# Patient Record
Sex: Female | Born: 1960
Health system: Southern US, Community
[De-identification: ages and names within clinical notes are randomized; demographics above are authoritative.]

## PROBLEM LIST (undated history)

## (undated) DIAGNOSIS — I509 Heart failure, unspecified: Secondary | ICD-10-CM

## (undated) DIAGNOSIS — M1990 Unspecified osteoarthritis, unspecified site: Secondary | ICD-10-CM

## (undated) DIAGNOSIS — F319 Bipolar disorder, unspecified: Secondary | ICD-10-CM

## (undated) DIAGNOSIS — E119 Type 2 diabetes mellitus without complications: Secondary | ICD-10-CM

## (undated) DIAGNOSIS — E7800 Pure hypercholesterolemia, unspecified: Secondary | ICD-10-CM

## (undated) DIAGNOSIS — K589 Irritable bowel syndrome without diarrhea: Secondary | ICD-10-CM

## (undated) DIAGNOSIS — H353190 Nonexudative age-related macular degeneration, unspecified eye, stage unspecified: Secondary | ICD-10-CM

## (undated) DIAGNOSIS — F329 Major depressive disorder, single episode, unspecified: Secondary | ICD-10-CM

## (undated) DIAGNOSIS — I10 Essential (primary) hypertension: Secondary | ICD-10-CM

## (undated) DIAGNOSIS — F32A Depression, unspecified: Secondary | ICD-10-CM

## (undated) HISTORY — DX: Irritable bowel syndrome, unspecified: K58.9

## (undated) HISTORY — PX: OTHER SURGICAL HISTORY: SHX169

## (undated) HISTORY — PX: CARPAL TUNNEL RELEASE: SHX101

## (undated) HISTORY — DX: Nonexudative age-related macular degeneration, unspecified eye, stage unspecified: H35.3190

## (undated) HISTORY — DX: Depression, unspecified: F32.A

## (undated) HISTORY — DX: Essential (primary) hypertension: I10

## (undated) HISTORY — DX: Major depressive disorder, single episode, unspecified: F32.9

## (undated) HISTORY — DX: Bipolar disorder, unspecified: F31.9

---
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
## 1994-07-27 HISTORY — PX: TUBAL LIGATION: SHX77

## 2000-08-19 ENCOUNTER — Inpatient Hospital Stay (HOSPITAL_COMMUNITY): Admission: EM | Admit: 2000-08-19 | Discharge: 2000-08-23 | Payer: Self-pay | Admitting: Psychiatry

## 2000-09-08 ENCOUNTER — Other Ambulatory Visit: Admission: RE | Admit: 2000-09-08 | Discharge: 2000-09-08 | Payer: Self-pay | Admitting: Obstetrics and Gynecology

## 2002-12-12 ENCOUNTER — Inpatient Hospital Stay (HOSPITAL_COMMUNITY): Admission: EM | Admit: 2002-12-12 | Discharge: 2002-12-19 | Payer: Self-pay | Admitting: Psychiatry

## 2002-12-27 ENCOUNTER — Inpatient Hospital Stay (HOSPITAL_COMMUNITY): Admission: EM | Admit: 2002-12-27 | Discharge: 2002-12-31 | Payer: Self-pay | Admitting: Psychiatry

## 2003-01-16 ENCOUNTER — Inpatient Hospital Stay (HOSPITAL_COMMUNITY): Admission: EM | Admit: 2003-01-16 | Discharge: 2003-01-18 | Payer: Self-pay | Admitting: Emergency Medicine

## 2003-03-12 ENCOUNTER — Emergency Department (HOSPITAL_COMMUNITY): Admission: EM | Admit: 2003-03-12 | Discharge: 2003-03-12 | Payer: Self-pay

## 2003-12-23 ENCOUNTER — Inpatient Hospital Stay (HOSPITAL_COMMUNITY): Admission: EM | Admit: 2003-12-23 | Discharge: 2003-12-25 | Payer: Self-pay | Admitting: Emergency Medicine

## 2003-12-25 ENCOUNTER — Inpatient Hospital Stay (HOSPITAL_COMMUNITY): Admission: RE | Admit: 2003-12-25 | Discharge: 2003-12-28 | Payer: Self-pay | Admitting: Psychiatry

## 2005-07-27 DIAGNOSIS — E119 Type 2 diabetes mellitus without complications: Secondary | ICD-10-CM

## 2005-07-27 HISTORY — DX: Type 2 diabetes mellitus without complications: E11.9

## 2006-01-26 ENCOUNTER — Encounter: Admission: RE | Admit: 2006-01-26 | Discharge: 2006-01-26 | Payer: Self-pay | Admitting: Nephrology

## 2006-03-02 ENCOUNTER — Encounter: Payer: Self-pay | Admitting: Internal Medicine

## 2006-03-04 ENCOUNTER — Encounter: Payer: Self-pay | Admitting: Internal Medicine

## 2006-03-04 LAB — CONVERTED CEMR LAB
ALT: 28 units/L
AST: 17 units/L
Albumin: 3.7 g/dL
Calcium: 9.1 mg/dL
Globulin: 3.3 g/dL
HCT: 37.9 %
Hemoglobin: 13.1 g/dL
Platelets: 234 10*3/uL
Potassium, serum: 4.1 mmol/L
Sodium, serum: 135 mmol/L

## 2006-04-30 ENCOUNTER — Encounter: Admission: RE | Admit: 2006-04-30 | Discharge: 2006-04-30 | Payer: Self-pay | Admitting: Nephrology

## 2006-07-29 ENCOUNTER — Encounter: Admission: RE | Admit: 2006-07-29 | Discharge: 2006-10-27 | Payer: Self-pay | Admitting: Nephrology

## 2006-09-01 ENCOUNTER — Encounter: Payer: Self-pay | Admitting: Internal Medicine

## 2006-09-01 LAB — CONVERTED CEMR LAB
ALT: 31 units/L
Alkaline Phosphatase: 63 units/L
BUN: 13 mg/dL
CO2, serum: 32 mmol/L
Calcium: 9.7 mg/dL
Chloride, Serum: 101 mmol/L
Cholesterol: 161 mg/dL
HDL: 41 mg/dL
Sodium, serum: 139 mmol/L
Total Bilirubin: 0.6 mg/dL

## 2006-11-04 ENCOUNTER — Encounter: Admission: RE | Admit: 2006-11-04 | Discharge: 2007-02-02 | Payer: Self-pay | Admitting: Nephrology

## 2007-01-25 ENCOUNTER — Encounter: Admission: RE | Admit: 2007-01-25 | Discharge: 2007-04-25 | Payer: Self-pay | Admitting: Nephrology

## 2007-04-05 ENCOUNTER — Encounter (INDEPENDENT_AMBULATORY_CARE_PROVIDER_SITE_OTHER): Payer: Self-pay | Admitting: Plastic Surgery

## 2007-04-05 ENCOUNTER — Ambulatory Visit (HOSPITAL_BASED_OUTPATIENT_CLINIC_OR_DEPARTMENT_OTHER): Admission: RE | Admit: 2007-04-05 | Discharge: 2007-04-05 | Payer: Self-pay | Admitting: Plastic Surgery

## 2008-04-24 ENCOUNTER — Encounter: Payer: Self-pay | Admitting: Internal Medicine

## 2008-04-24 LAB — CONVERTED CEMR LAB
AST: 12 units/L
Chloride, Serum: 102 mmol/L
MCH: 31 pg
Potassium, serum: 4.9 mmol/L
RBC count: 4.36 10*6/uL
WBC, blood: 5.6 10*3/uL

## 2008-05-17 ENCOUNTER — Encounter: Payer: Self-pay | Admitting: Internal Medicine

## 2008-05-18 ENCOUNTER — Encounter: Payer: Self-pay | Admitting: Internal Medicine

## 2008-05-18 LAB — CONVERTED CEMR LAB: TSH: 0.673 microintl units/mL

## 2008-06-27 ENCOUNTER — Encounter (INDEPENDENT_AMBULATORY_CARE_PROVIDER_SITE_OTHER): Payer: Self-pay | Admitting: *Deleted

## 2008-07-16 ENCOUNTER — Ambulatory Visit: Payer: Self-pay | Admitting: Internal Medicine

## 2008-07-16 DIAGNOSIS — K589 Irritable bowel syndrome without diarrhea: Secondary | ICD-10-CM | POA: Insufficient documentation

## 2008-07-16 DIAGNOSIS — E1365 Other specified diabetes mellitus with hyperglycemia: Secondary | ICD-10-CM

## 2008-07-16 DIAGNOSIS — L930 Discoid lupus erythematosus: Secondary | ICD-10-CM | POA: Insufficient documentation

## 2008-07-16 DIAGNOSIS — IMO0002 Reserved for concepts with insufficient information to code with codable children: Secondary | ICD-10-CM | POA: Insufficient documentation

## 2008-07-16 HISTORY — DX: Irritable bowel syndrome without diarrhea: K58.9

## 2008-07-16 HISTORY — DX: Reserved for concepts with insufficient information to code with codable children: IMO0002

## 2008-07-16 HISTORY — DX: Other specified diabetes mellitus with hyperglycemia: E13.65

## 2008-08-10 ENCOUNTER — Encounter: Payer: Self-pay | Admitting: Internal Medicine

## 2008-08-16 ENCOUNTER — Encounter: Payer: Self-pay | Admitting: Internal Medicine

## 2008-09-06 ENCOUNTER — Ambulatory Visit: Payer: Self-pay | Admitting: Psychiatry

## 2008-09-06 ENCOUNTER — Inpatient Hospital Stay (HOSPITAL_COMMUNITY): Admission: RE | Admit: 2008-09-06 | Discharge: 2008-09-12 | Payer: Self-pay | Admitting: Psychiatry

## 2008-11-08 ENCOUNTER — Telehealth (INDEPENDENT_AMBULATORY_CARE_PROVIDER_SITE_OTHER): Payer: Self-pay | Admitting: *Deleted

## 2008-11-12 ENCOUNTER — Encounter (INDEPENDENT_AMBULATORY_CARE_PROVIDER_SITE_OTHER): Payer: Self-pay | Admitting: *Deleted

## 2008-11-13 ENCOUNTER — Ambulatory Visit: Payer: Self-pay | Admitting: Internal Medicine

## 2008-11-13 DIAGNOSIS — M549 Dorsalgia, unspecified: Secondary | ICD-10-CM

## 2008-11-15 ENCOUNTER — Telehealth (INDEPENDENT_AMBULATORY_CARE_PROVIDER_SITE_OTHER): Payer: Self-pay | Admitting: *Deleted

## 2008-11-15 LAB — CONVERTED CEMR LAB
BUN: 11 mg/dL (ref 6–23)
CO2: 30 meq/L (ref 19–32)
Chloride: 102 meq/L (ref 96–112)
Creatinine, Ser: 0.4 mg/dL (ref 0.4–1.2)
Potassium: 3.1 meq/L — ABNORMAL LOW (ref 3.5–5.1)

## 2008-11-20 ENCOUNTER — Encounter: Admission: RE | Admit: 2008-11-20 | Discharge: 2008-11-20 | Payer: Self-pay | Admitting: Endocrinology

## 2009-02-19 ENCOUNTER — Ambulatory Visit: Payer: Self-pay | Admitting: Internal Medicine

## 2009-02-21 ENCOUNTER — Encounter: Admission: RE | Admit: 2009-02-21 | Discharge: 2009-02-21 | Payer: Self-pay

## 2009-02-26 ENCOUNTER — Telehealth (INDEPENDENT_AMBULATORY_CARE_PROVIDER_SITE_OTHER): Payer: Self-pay | Admitting: *Deleted

## 2009-02-26 LAB — CONVERTED CEMR LAB
Hgb A1c MFr Bld: 5.9 % (ref 4.6–6.5)
Microalb Creat Ratio: 201.6 mg/g — ABNORMAL HIGH (ref 0.0–30.0)
Microalb, Ur: 17.9 mg/dL — ABNORMAL HIGH (ref 0.0–1.9)

## 2009-04-17 ENCOUNTER — Telehealth (INDEPENDENT_AMBULATORY_CARE_PROVIDER_SITE_OTHER): Payer: Self-pay | Admitting: *Deleted

## 2009-05-08 ENCOUNTER — Ambulatory Visit: Payer: Self-pay | Admitting: Internal Medicine

## 2009-05-11 ENCOUNTER — Inpatient Hospital Stay (HOSPITAL_COMMUNITY): Admission: RE | Admit: 2009-05-11 | Discharge: 2009-05-13 | Payer: Self-pay | Admitting: Psychiatry

## 2009-05-11 ENCOUNTER — Ambulatory Visit: Payer: Self-pay | Admitting: Psychiatry

## 2009-06-07 ENCOUNTER — Ambulatory Visit: Payer: Self-pay | Admitting: Internal Medicine

## 2009-06-13 ENCOUNTER — Encounter: Payer: Self-pay | Admitting: Internal Medicine

## 2009-06-24 ENCOUNTER — Ambulatory Visit: Payer: Self-pay | Admitting: Internal Medicine

## 2009-06-26 ENCOUNTER — Telehealth (INDEPENDENT_AMBULATORY_CARE_PROVIDER_SITE_OTHER): Payer: Self-pay | Admitting: *Deleted

## 2009-06-27 LAB — CONVERTED CEMR LAB
ALT: 29 units/L (ref 0–35)
BUN: 10 mg/dL (ref 6–23)
Calcium: 8.8 mg/dL (ref 8.4–10.5)
GFR calc non Af Amer: 112.81 mL/min (ref >60)
Glucose, Bld: 92 mg/dL (ref 70–99)
Hgb A1c MFr Bld: 5.7 % (ref 4.6–6.5)
Microalb, Ur: 30 mg/dL — ABNORMAL HIGH (ref 0.0–1.9)
Potassium: 4.6 meq/L (ref 3.5–5.1)
Sodium: 139 meq/L (ref 135–145)

## 2009-07-02 ENCOUNTER — Encounter: Payer: Self-pay | Admitting: Internal Medicine

## 2009-07-23 ENCOUNTER — Encounter: Payer: Self-pay | Admitting: Internal Medicine

## 2009-08-20 ENCOUNTER — Telehealth (INDEPENDENT_AMBULATORY_CARE_PROVIDER_SITE_OTHER): Payer: Self-pay | Admitting: *Deleted

## 2009-08-22 ENCOUNTER — Encounter: Payer: Self-pay | Admitting: Internal Medicine

## 2009-09-24 ENCOUNTER — Encounter: Payer: Self-pay | Admitting: Internal Medicine

## 2009-10-18 ENCOUNTER — Encounter: Payer: Self-pay | Admitting: Internal Medicine

## 2009-10-18 LAB — HM DIABETES EYE EXAM: HM Diabetic Eye Exam: NORMAL

## 2009-10-21 ENCOUNTER — Encounter (INDEPENDENT_AMBULATORY_CARE_PROVIDER_SITE_OTHER): Payer: Self-pay | Admitting: *Deleted

## 2009-10-21 ENCOUNTER — Ambulatory Visit: Payer: Self-pay | Admitting: Internal Medicine

## 2010-01-28 ENCOUNTER — Encounter (INDEPENDENT_AMBULATORY_CARE_PROVIDER_SITE_OTHER): Payer: Self-pay | Admitting: *Deleted

## 2010-01-28 ENCOUNTER — Ambulatory Visit: Payer: Self-pay | Admitting: Internal Medicine

## 2010-03-13 ENCOUNTER — Ambulatory Visit: Payer: Self-pay | Admitting: Internal Medicine

## 2010-03-13 DIAGNOSIS — G43909 Migraine, unspecified, not intractable, without status migrainosus: Secondary | ICD-10-CM | POA: Insufficient documentation

## 2010-03-13 HISTORY — DX: Migraine, unspecified, not intractable, without status migrainosus: G43.909

## 2010-05-06 ENCOUNTER — Ambulatory Visit: Payer: Self-pay | Admitting: Internal Medicine

## 2010-06-09 ENCOUNTER — Ambulatory Visit: Payer: Self-pay | Admitting: Family Medicine

## 2010-07-01 ENCOUNTER — Ambulatory Visit: Payer: Self-pay | Admitting: Internal Medicine

## 2010-07-03 ENCOUNTER — Encounter: Payer: Self-pay | Admitting: Internal Medicine

## 2010-07-15 ENCOUNTER — Ambulatory Visit: Payer: Self-pay | Admitting: Internal Medicine

## 2010-07-22 ENCOUNTER — Ambulatory Visit
Admission: RE | Admit: 2010-07-22 | Discharge: 2010-07-22 | Payer: Self-pay | Source: Home / Self Care | Attending: Internal Medicine | Admitting: Internal Medicine

## 2010-07-22 DIAGNOSIS — M6740 Ganglion, unspecified site: Secondary | ICD-10-CM

## 2010-07-22 HISTORY — DX: Ganglion, unspecified site: M67.40

## 2010-07-29 ENCOUNTER — Encounter: Payer: Self-pay | Admitting: Internal Medicine

## 2010-08-04 LAB — HM COLONOSCOPY

## 2010-08-06 ENCOUNTER — Encounter: Payer: Self-pay | Admitting: Internal Medicine

## 2010-08-14 ENCOUNTER — Other Ambulatory Visit: Payer: Self-pay | Admitting: Gastroenterology

## 2010-08-14 ENCOUNTER — Encounter: Payer: Self-pay | Admitting: Internal Medicine

## 2010-08-14 LAB — HM COLONOSCOPY

## 2010-08-15 ENCOUNTER — Encounter: Payer: Self-pay | Admitting: Internal Medicine

## 2010-08-26 NOTE — Consult Note (Signed)
Summary[facility] @ [name] [name]   Imported By: Lanelle Bal 07/29/2009 08:36:29  _____________________________________________________________________  External Attachment:    Type:   Image     Comment:   External Document
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
## 2010-08-26 NOTE — Letter (Signed)
Summary[facility] @ [name]   Imported By: Lanelle Bal 08/29/2009 12:55:12  _____________________________________________________________________  External Attachment:    Type:   Image     Comment:   External Document
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
## 2010-08-26 NOTE — Miscellaneous (Signed)
Summary: neg dm eye exam  Clinical Lists Changes  Observations: Added new observation of DMEYEEXAMNXT: 10/2010 (10/18/2009 16:26) Added new observation of DMEYEEXMRES: normal (10/18/2009 16:26) Added new observation of EYE EXAM BY: [facility] (10/18/2009 16:26) Added new observation of DIAB EYE EX: normal (10/18/2009 16:26)       Diabetes Management Exam:    Eye Exam:       Eye Exam done elsewhere          Date: 10/18/2009          Results: normal          Done by: [facility]
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
## 2010-08-26 NOTE — Letter (Signed)
Summary: [place] No Show Letter  [facility] [place]  [address] [place], [place] [address]   Phone: [phone]  Fax: [phone]    10/21/2009 MRN: [id number]  Adrienne [facility] [address] [place], [place]  [address]   Dear Ms. Hosking,   Our records indicate that you missed your scheduled appointment with Dr. [name] on 10/21/09.  Please contact this office to reschedule your appointment as soon as possible.  It is important that you keep your scheduled appointments with your physician, so we can provide you the best care possible.  Please be advised that there may be a charge for "no show" appointments.    Sincerely,   [place] [facility]

## 2010-08-26 NOTE — Progress Notes (Signed)
Summary: hip pain//paz, [facility] 1/25  Phone Note Call from Patient   Caller: Patient Summary of Call: pt called says have Lupus, walking yesterday, now have excruciating pain, can hardly walk to the bathroom.  Not taking Darvocet anymore D/C.  Asked pt did she call her rheumatologist, pt says no,  she saw her 1 time, informed pt per note she was to followup in 6 weeks, gave pt Dr Zenovia Jordan # at  [facility].  pt wanteds to know if Dr [name] can give her something for the pain.  last seen 06/24/09  Initial call taken by: Kandice Hams,  August 20, 2009 9:50 AM  Follow-up for Phone Call        advise patient needs to discuss pain management with rheumatology Jose E. Paz MD  August 20, 2009 1:23 PM   Left message on machine for pt to return call Shary Decamp  August 20, 2009 4:50 PM  Follow-up by:    Additional Follow-up for Phone Call Additional follow up Details #1::        pt returned call this am, given Dr [name] recommendations, need to discuss pain mgmt with rheumatology, Phone # given again to patient to call. pt said she never called she thought she would get better. Kandice Hams  August 21, 2009 8:47 AM  Additional Follow-up by: Kandice Hams,  August 21, 2009 8:47 AM

## 2010-08-26 NOTE — Letter (Signed)
Summary: [facility]  [facility]   Imported By: Lanelle Bal 11/11/2009 11:10:06  _____________________________________________________________________  External Attachment:    Type:   Image     Comment:   External Document

## 2010-08-26 NOTE — Letter (Signed)
Summary: [facility]  [facility]   Imported By: Lanelle Bal 10/11/2009 14:28:50  _____________________________________________________________________  External Attachment:    Type:   Image     Comment:   External Document

## 2010-08-26 NOTE — Letter (Signed)
Summary: Out of Work  [facility] [employer]  [address] [place], [place] [address]   Phone: [phone]  Fax: [phone]    January 28, 2010   Employee:  Shaquanda A Kauffman    To Whom It May Concern:   For Medical reasons, please excuse the above named employee from work for the following dates:  Start:   January 28, 2010  End:   July 7,2011  If you need additional information, please feel free to contact our office.         Sincerely,    Willow Ora, MD  Appended Document: Out of Work Put in on wrong pt.

## 2010-08-26 NOTE — Assessment & Plan Note (Signed)
Summary: FLU SHOT//PH  Nurse Visit  CC: Flu shot./kb   Allergies: 1)  ! Pcn  Orders Added: 1)  Flu Vaccine 28yrs + MEDICARE PATIENTS [Q2039] 2)  Administration Flu vaccine - MCR [G0008]           Flu Vaccine Consent Questions     Do you have a history of severe allergic reactions to this vaccine? no    Any prior history of allergic reactions to egg and/or gelatin? no    Do you have a sensitivity to the preservative Thimersol? no    Do you have a past history of Guillan-Barre Syndrome? no    Do you currently have an acute febrile illness? no    Have you ever had a severe reaction to latex? no    Vaccine information given and explained to patient? yes    Are you currently pregnant? no    Lot Number:[id number]   Exp Date:01/24/2011   Site Given  Left Deltoid IMu

## 2010-08-28 NOTE — Consult Note (Signed)
Summary: microscopic colitis? RX Cscope-----Gastroenterology  [facility]   Imported By: Lanelle Bal 08/15/2010 08:46:30  _____________________________________________________________________  External Attachment:    Type:   Image     Comment:   External Document
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
## 2010-08-28 NOTE — Consult Note (Signed)
Summary: local injection foot, f/u 3 wweks--[facility]   [facility]   Imported By: Lanelle Bal 08/08/2010 09:42:27  _____________________________________________________________________  External Attachment:    Type:   Image     Comment:   External Document
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
## 2010-09-03 ENCOUNTER — Encounter: Payer: Self-pay | Admitting: Internal Medicine

## 2010-09-04 ENCOUNTER — Ambulatory Visit (INDEPENDENT_AMBULATORY_CARE_PROVIDER_SITE_OTHER): Payer: Medicare Other | Admitting: Internal Medicine

## 2010-09-04 ENCOUNTER — Encounter: Payer: Self-pay | Admitting: Internal Medicine

## 2010-09-04 DIAGNOSIS — I10 Essential (primary) hypertension: Secondary | ICD-10-CM | POA: Insufficient documentation

## 2010-09-04 HISTORY — DX: Essential (primary) hypertension: I10
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
## 2010-09-11 NOTE — Procedures (Signed)
Summary: Colonoscopy--next in 3 years  Colonoscopy/[facility]   Imported By: Sherian Rein 09/02/2010 14:04:54  _____________________________________________________________________  External Attachment:    Type:   Image     Comment:   External Document

## 2010-09-17 NOTE — Letter (Signed)
Summary: Annual Exam/[facility]  Annual Exam/[facility]   Imported By: Maryln Gottron 09/09/2010 09:19:45  _____________________________________________________________________  External Attachment:    Type:   Image     Comment:   External Document

## 2010-09-17 NOTE — Letter (Signed)
Summary: BP Check/[facility]  BP Check/[facility]   Imported By: Maryln Gottron 09/09/2010 09:20:54  _____________________________________________________________________  External Attachment:    Type:   Image     Comment:   External Document

## 2010-09-18 ENCOUNTER — Encounter: Payer: Self-pay | Admitting: Internal Medicine

## 2010-09-25 ENCOUNTER — Ambulatory Visit (INDEPENDENT_AMBULATORY_CARE_PROVIDER_SITE_OTHER): Payer: Medicare Other

## 2010-09-25 ENCOUNTER — Other Ambulatory Visit: Payer: Self-pay | Admitting: Internal Medicine

## 2010-09-25 ENCOUNTER — Encounter: Payer: Self-pay | Admitting: Internal Medicine

## 2010-09-25 DIAGNOSIS — I10 Essential (primary) hypertension: Secondary | ICD-10-CM

## 2010-09-25 LAB — BASIC METABOLIC PANEL
BUN: 12 mg/dL (ref 6–23)
CO2: 27 mEq/L (ref 19–32)
GFR: 145.19 mL/min (ref >60.00)
Glucose, Bld: 70 mg/dL (ref 70–99)
Potassium: 4.4 mEq/L (ref 3.5–5.1)
Sodium: 135 mEq/L (ref 135–145)

## 2010-10-07 NOTE — Assessment & Plan Note (Signed)
Summary: bp check, lab = bmp--DX hypertension   ///sph  Nurse Visit   Vital Signs:  Patient profile:   50 year old female Weight:      168 pounds BMI:     30.59 Pulse rate:   76 / minute BP sitting:   126 / 78  (left arm)  Vitals Entered By: Doristine Devoid CMA (September 25, 2010 10:06 AM) CC: bp check and lab   Current Medications (verified): 1)  Alprazolam 1 Mg Tabs (Alprazolam) .... Take 1 Tab in Am 2 Tabs in Pm and 1 Tab Once Daily As Needed 2)  Buproprin Xl 250 Mg .Marland Kitchen.. 1 By Mouth Qid 3)  Depakote Er 500 Mg Xr24h-Tab (Divalproex Sodium) .... Take 4 Tab Once Daily 4)  Abilify 10 Mg Tabs (Aripiprazole) .... 3 By Mouth At Bedtime 5)  Primidone 50 Mg Tabs (Primidone) 6)  Lisinopril 10 Mg Tabs (Lisinopril) .... One By Mouth Daily  Allergies (verified): 1)  ! Pcn  Impression & Recommendations:  Problem # 1:  HYPERTENSION (ICD-401.9)  BP stable on lisinopril Her updated medication list for this problem includes:    Lisinopril 10 Mg Tabs (Lisinopril) ..... One by mouth daily  Orders: Est. Patient Level I (62130) Specimen Handling (86578) Venipuncture (46962) TLB-BMP (Basic Metabolic Panel-BMET) (80048-METABOL) Specimen Handling (95284)  BP today: 126/78 Prior BP: 148/88 (09/04/2010)  Labs Reviewed: K+: 4.6 (06/24/2009) Creat: : 0.6 (06/24/2009)   Chol: 161 (09/01/2006)   HDL: 41 (09/01/2006)   LDL: 104 (09/01/2006)   TG: 82 (09/01/2006)  Complete Medication List: 1)  Alprazolam 1 Mg Tabs (Alprazolam) .... Take 1 tab in am 2 tabs in pm and 1 tab once daily as needed 2)  Buproprin Xl 250 Mg  .Marland KitchenMarland Kitchen. 1 by mouth qid 3)  Depakote Er 500 Mg Xr24h-tab (Divalproex sodium) .... Take 4 tab once daily 4)  Abilify 10 Mg Tabs (Aripiprazole) .... 3 by mouth at bedtime 5)  Primidone 50 Mg Tabs (Primidone) 6)  Lisinopril 10 Mg Tabs (Lisinopril) .... One by mouth daily   Orders Added: 1)  Est. Patient Level I [13244] 2)  Specimen Handling [99000] 3)  Venipuncture [36415] 4)  TLB-BMP  (Basic Metabolic Panel-BMET) [80048-METABOL] 5)  Specimen Handling [99000]

## 2010-10-07 NOTE — Letter (Signed)
Summary: [facility]--next Cscope 3 years   [facility]   Imported By: Maryln Gottron 09/23/2010 15:55:17  _____________________________________________________________________  External Attachment:    Type:   Image     Comment:   External Document

## 2010-10-30 LAB — URINALYSIS, ROUTINE W REFLEX MICROSCOPIC
Glucose, UA: >1000 mg/dL — AB
Ketones, ur: NEGATIVE mg/dL
Leukocytes, UA: NEGATIVE
Nitrite: NEGATIVE
Specific Gravity, Urine: 1.025 (ref 1.005–1.030)
pH: 7.5 (ref 5.0–8.0)

## 2010-10-30 LAB — CBC
HCT: 41.5 % (ref 36.0–46.0)
Platelets: 219 10*3/uL (ref 150–400)
RDW: 13.3 % (ref 11.5–15.5)
WBC: 6.6 10*3/uL (ref 4.0–10.5)

## 2010-10-30 LAB — COMPREHENSIVE METABOLIC PANEL
AST: 23 U/L (ref 0–37)
Albumin: 3.2 g/dL — ABNORMAL LOW (ref 3.5–5.2)
Alkaline Phosphatase: 47 U/L (ref 39–117)
BUN: 10 mg/dL (ref 6–23)
Creatinine, Ser: 0.44 mg/dL (ref 0.4–1.2)
GFR calc Af Amer: >60 mL/min (ref >60)
Potassium: 3.8 mEq/L (ref 3.5–5.1)
Total Protein: 6.7 g/dL (ref 6.0–8.3)

## 2010-10-30 LAB — BENZODIAZEPINE, QUANTITATIVE, URINE
Nordiazepam GC/MS Conf: NEGATIVE
Oxazepam GC/MS Conf: NEGATIVE

## 2010-10-30 LAB — DRUGS OF ABUSE SCREEN W/O ALC, ROUTINE URINE
Amphetamine Screen, Ur: NEGATIVE
Benzodiazepines.: POSITIVE — AB
Creatinine,U: 68.8 mg/dL
Marijuana Metabolite: NEGATIVE
Propoxyphene: NEGATIVE

## 2010-10-30 LAB — URINE MICROSCOPIC-ADD ON

## 2010-11-11 LAB — COMPREHENSIVE METABOLIC PANEL
ALT: 18 U/L (ref 0–35)
BUN: 8 mg/dL (ref 6–23)
CO2: 28 mEq/L (ref 19–32)
Calcium: 8.8 mg/dL (ref 8.4–10.5)
Creatinine, Ser: 0.47 mg/dL (ref 0.4–1.2)
GFR calc non Af Amer: >60 mL/min (ref >60)
Glucose, Bld: 141 mg/dL — ABNORMAL HIGH (ref 70–99)
Total Protein: 6.3 g/dL (ref 6.0–8.3)

## 2010-11-11 LAB — CBC
HCT: 42.2 % (ref 36.0–46.0)
Hemoglobin: 14.2 g/dL (ref 12.0–15.0)
MCHC: 33.7 g/dL (ref 30.0–36.0)
MCV: 93.4 fL (ref 78.0–100.0)
RBC: 4.52 MIL/uL (ref 3.87–5.11)
RDW: 13.3 % (ref 11.5–15.5)

## 2010-11-11 LAB — GLUCOSE, CAPILLARY: Glucose-Capillary: 95 mg/dL (ref 70–99)

## 2010-11-11 LAB — VALPROIC ACID LEVEL: Valproic Acid Lvl: 80.3 ug/mL (ref 50.0–100.0)

## 2010-11-17 ENCOUNTER — Encounter: Payer: Self-pay | Admitting: Internal Medicine

## 2010-12-09 NOTE — Op Note (Signed)
Adrienne Bennett, Adrienne Bennett              ACCOUNT NO.:  [id number]   MEDICAL RECORD NO.:  [id number]          PATIENT TYPE:  AMB   LOCATION:  [facility]                          FACILITY:  [facility]   PHYSICIAN:  Mary Contogiannis, M.D.DATE OF BIRTH:  Jun 13, 1961   DATE OF PROCEDURE:  04/05/2007  DATE OF DISCHARGE:  04/05/2007                               OPERATIVE REPORT   PREOPERATIVE DIAGNOSIS:  Soft tissue mass, corner of left mouth.   POSTOPERATIVE DIAGNOSIS:  Soft tissue mass, corner of left mouth.   PROCEDURES:  1. Excision of 1.8-cm soft tissue mass left corner of mouth.  2. Complex closure, 2.3 cm, left corner of mouth.  3. Intraoral incision.   ATTENDING SURGEON:  Brantley Persons, M.D.   ANESTHESIA:  General. Anesthesiologist is Dr. Sampson Goon.   COMPLICATIONS:  None.   INDICATIONS FOR PROCEDURE:  The patient is a 50 year old Caucasian  female who has developed a soft tissue mass present in the corner of the  left brow.  Since this can be approached through the intraoral route, we  will do that in order to minimize an external facial scar.  This has  been discussed with the patient, and she would like to proceed with the  surgery at this time.  Due to the location of the soft tissue mass, we  will performed the procedure under general anesthesia in order to make  it most comfortable for the patient after consulting with the  anesthesiology staff.   PROCEDURE:  The patient was marked in preoperative holding area, brought  the OR, laid on the table in supine position.  After adequate general  anesthesia was obtained, the patient's face and mouth were prepped with  Betadine and draped in sterile fashion.  The skin and subcutaneous  tissues in the area of the soft tissue mass that was present in the  corner of the left mouth were injected with 1% lidocaine with  epinephrine.  After adequate hemostasis and anesthesia had taken effect,  the procedure was begun.   The left side of the  mouth was everted to expose the intraoral mucosa at  the corner of the left mouth.  An incision was made over the palpable  mass.  Meticulous hemostasis was obtained with the Bovie electrocautery  throughout the procedure.  The cheek musculature in the area was then  gently retracted, and the soft tissue mass was identified.  The soft  tissue mass was then excised with both sharp as well as a small amount  of blunt dissection.  The specimen was removed from the patient and  passed off the table to undergo permanent pathologic section evaluation.  Meticulous hemostasis was once again obtained.  The total length of the  soft tissue mass removed was 1.8 cm.  I then proceeded with a complex  closure of the incision.  The deeper musculature layer was closed with 3-  0 Monocryl suture.  The submucosal layer was also closed with 3-0  Monocryl suture.  The intraoral mucosa was then closed with a 4-0 Vicryl  in a running suture.  There were no complications.  The  patient  tolerated the procedure well.  Total length of incision closure was 2.3  cm.  The final needle and sponge counts were reported to be correct at  the end of  the case.  The patient was then awakened from the general anesthesia and  taken to recovery room in stable condition.  Both the patient and her  husband were given proper postoperative wound care instructions.  She  was discharged home in his care in stable condition.  Follow-up  appointment will be tomorrow in the office.           ______________________________  Brantley Persons, M.D.     MC/MEDQ  D:  04/27/2007  T:  04/28/2007  Job:  [id number]
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
## 2010-12-12 NOTE — Discharge Summary (Signed)
[facility]  Patient:    Adrienne Bennett, Adrienne Bennett                     MRN: [id number] Adm. Date:  [id number] Attending:  Marlyn Corporal Fabmy Dictator:   Landry Corporal, N.P.                           Discharge Summary  DATE OF ADMISSION:  August 19, 2000  PATIENT IDENTIFICATION:  This is a 50 year old married white female voluntarily admitted to [facility] for depression and suicidal ideation.  Patient was admitted on August 19, 2000.  HISTORY OF PRESENT ILLNESS:  Patient presents with history of depression for a couple of weeks.  Patient states that it had intensified with her mother-in-laws death on 12-09-2022.  Patient "just wanted to cut herself" after this occurrence.  Patient does state during interview that "Im really mentally healthy."  Patient states that sleeps without her medicine, her appetite has been good, she denies any auditory or visual hallucinations, no paranoia, no homicidal ideation.  Patient reports that she wont talk about her depression, that she likes to hold her feelings inside, and usually when she cuts herself that makes her feel better.  PAST PSYCHIATRIC HISTORY:  Patient had an admission to [facility] about 2 years ago for depression and suicide attempt.  Patient has had other inpatient admissions over the years for suicide attempts.  Patient states that she does cut herself fairly often.  SUBSTANCE ABUSE HISTORY:  She is a nonsmoker.  She denies any alcohol or substance abuse problems.  PAST MEDICAL HISTORY:  Primary care Aisia Correira:  She sees Dr. Damaris Schooner at [facility].  Her medical problems are hypertension.  Medications: She takes Norvasc 5 mg 1 p.o. q.d., Prozac 80 mg q.d., Valium 5 mg t.i.d., amantadine, she is unsure of the dosage, and Gabitril q.h.s. that were prescribed by Dr. Okey Dupre and Dr. Charlott Holler at the [facility].  Drug allergies:  She is allergic to PENICILLIN which  gives her a rash.  Her physical examination is pending.  Her vital signs are 98.2 temp, 92 pulse, 24 respirations, 132/88 blood pressure.  Her labs are pending.  SOCIAL HISTORY:  A 50 year old married white female, married for 18 years, she has 1 child aged 43.  She lives with her husband and child.  She works in Airline pilot at [employer].  She has completed some college courses.  She has no financial or legal problems.  FAMILY HISTORY:  None that she is aware of.  MENTAL STATUS EXAMINATION:  She is an alert middle-aged white female.  She is casually dressed, no eye contact.  Cooperative and calm.  Speech is normal and relevant.  Mood is depressed.  Affect is blunt.  Thought processes are coherent.  There is no evidence of psychosis.  Currently denying any suicidal or homicidal ideations, no auditory or visual hallucinations, no paranoia, no flight of ideas.  Cognitively, her cognitive function is intact.  Her memory is fair.    Her judgment is poor, her insight is fair.  ADMISSION DIAGNOSES: Axis I:    Major depressive disorder. Axis II:   Deferred. Axis III:  Hypertension. Axis IV:   Moderate, with grief issues. Axis V:    Current is 30, this past year is 54.  INITIAL PLAN OF CARE:  Voluntary admission to [facility] for depression and suicidal ideation, contract for  safety, check every 15 minutes. Patient is currently on a one-to-one for agitation and patient safety.  Will initiate Zydis 10 mg q.h.s. and resume her Prozac 80 mg q.d. and order Depakote ER 500 mg q.h.s. with a Depakote level pending on Sunday morning. Our plan is to return patient to her prior living arrangement.  ESTIMATED LENGTH OF STAY:  Four to five days. DD:  08/20/00 TD:  08/20/00 Job: [id number]

## 2010-12-12 NOTE — Discharge Summary (Signed)
NAME:  Adrienne Bennett, Adrienne Bennett                        ACCOUNT NO.:  [id number]   MEDICAL RECORD NO.:  [id number]                   PATIENT TYPE:  IPS   LOCATION:  0404                                 FACILITY:  [facility]   PHYSICIAN:  Geoffery Lyons, M.D.                   DATE OF BIRTH:  [birth year]   DATE OF ADMISSION:  12/25/2003  DATE OF DISCHARGE:  12/28/2003                                 DISCHARGE SUMMARY   CHIEF COMPLAINT AND PRESENT ILLNESS:  This was the fourth admission to [facility] for this 50 year old white female, married,  voluntarily admitted.  She had history of depression, prior suicide  attempts.  She presented to the emergency room after taking 60 tablets of  Trilafon.  She had stopped her medication because it was hard, as she  claimed, to take it all the time.  She was off medications for a week.  She  started hearing voices telling her to use the circular saw to cut off her  head.  Then, the voices told her, You can do it, and she overdosed.  She  apparently was stressed by the son's new baby.   PAST PSYCHIATRIC HISTORY:  This was the fourth time at [facility]; last time June 2004.  She had history of multiple personalities.  She had suicide attempt by cutting herself.   SUBSTANCE ABUSE HISTORY:  She denied the use or abuse of any substances.   PAST MEDICAL HISTORY:  1. Migraine headaches.  2. Dyslipidemia.  3. Arterial hypertension.   MEDICATIONS:  1. Prozac.  2. Trilafon.  3. Cogentin.  4. Depakote.   She had not taken.   PHYSICAL EXAMINATION:  Physical examination was performed, failed to show  any acute findings.   LABORATORY DATA:  CBC was within normal limits.  Blood chemistries:  Potassium 3.3, glucose 115.  SGOT 20, SGPT 22, total bilirubin 0.6.  Hemoglobin A1c 5.7.  Cholesterol 196.  TSH 2.259.   MENTAL STATUS EXAM:  Mental status exam revealed an alert, cooperative  female, drowsy but was able to get up and  walk with the examiner.  Oriented  to person, place, and time.  Constricted affect.  Some response latency,  monotone, normal pace.  Mood: Depressed.  Affect: Depressed.  Thought  processes: Not spontaneous, some hesitation, positive suicidal ideation  without a plan, endorsed auditory hallucinations fading.  Cognitive:  Cognition was well preserved.   ADMISSION DIAGNOSES:   AXIS I:  Major depression with psychotic features versus schizoaffective  disorder.   AXIS II:  No diagnosis.   AXIS III:  1. Hypokalemia.  2. Arterial hypertension.  3. Dyslipidemia.  4. Migraine.   AXIS IV:  Moderate.   AXIS V:  Global assessment of functioning upon admission 25, highest global  assessment of functioning in the last year 55-60.   HOSPITAL COURSE:  She was admitted and  started in intensive individual and  group psychotherapy.  She was given Ativan as needed for anxiety and Ambien  for sleep and she was given some Zyprexa as needed.  She had to be placed on  one-to-one observation.  She was resumed on Depakote 500 mg in the morning  and at bedtime.  She was replaced with potassium and resumed on Prozac 20 mg  per day, Trilafon 4 mg in the morning and 8 mg at night, Cogentin 0.5 mg  twice a day.  On June 1, she was able to contract for safety.  She endorsed  that she took an overdose, could not say why, felt it was impulsive, could  not identify the trigger.  She claimed that she was taking her medication  and was keeping her appointments.  By June 1, she was feeling back to  herself.  She was pleasant and cooperative, feeling much better, endorsed  that she would never do this again.  She was willing to sit down with the  husband and talk about their issues.  On June 3, the family session with the  husband went well.  Husband reported that she was close to baseline and  better than had been for months.  There were no concerns for discharge as  she was in full contact with reality.  There  were no suicidal ideas, no  homicidal ideas, no hallucinations, no delusions, so we went ahead and  discharged to outpatient followup.   DISCHARGE DIAGNOSES:   AXIS I:  Major depression with psychotic features.   AXIS II:  No diagnosis.   AXIS III:  1. Hypokalemia, corrected.  2. Arterial hypertension.  3. Dyslipidemia.  4. Migraine headaches.   AXIS IV:  Moderate.   AXIS V:  Global assessment of functioning upon discharge 50-55.   DISCHARGE MEDICATIONS:  1. Depakote 500 mg twice a day.  2. Prozac 20 mg per day.  3. Trilafon 4 mg in the morning and 8 mg at night.  4. Cogentin 0.5 mg twice a day.  5. Ativan 1 mg three times a day as needed.  6. Ambien 10 mg at bedtime for sleep.   FOLLOW UP:  She was to follow up with Archer Asa, M.D.                                               Geoffery Lyons, M.D.    IL/MEDQ  D:  01/29/2004  T:  01/29/2004  Job:  [id number]

## 2010-12-12 NOTE — Discharge Summary (Signed)
[facility]  Patient:    Adrienne Bennett, Adrienne Bennett                     MRN: [id number] Adm. Date:  [id number] Disc. Date: [id number] Attending:  Marlyn Corporal Fabmy Dictator:   Candi Leash. Orsini, N.P.                           Discharge Summary  IDENTIFYING INFORMATION:  This is a 50 year old married white female voluntarily admitted to [facility] for depression and suicidal ideation.  HISTORY OF PRESENT ILLNESS:  The patient presents with a history of depression for the past couple weeks, which has intensified with her mother-in-laws death on 12-30-22.  The patient just wanted to "cut herself" after this occurrence.  The patient stated during the interview that she is "really mentally healthy."  The patient states she sleeps without her medicine.  Her appetite has been good.  She denies any auditory or visual hallucinations, no paranoia, no homicidal ideation.  The patient reports that she wont talk about her depression.  She likes to hold her feelings inside and when she cuts herself, it makes her feel better.  PAST PSYCHIATRIC HISTORY:  The patient has had an admission two years prior for depression and suicide attempt.  She has had other inpatient admissions over the years for suicide attempts.  The patient states she cuts herself fairly often.  PAST MEDICAL HISTORY:  Primary care Londell Noll is Dr. Damaris Schooner at [facility].  Medical problems include hypertension.  MEDICATIONS:  Norvasc 5 mg 1 p.o. q.d., Prozac 80 mg q.d., Valium 5 mg t.i.d., amantadine uncertain dosage and Gabatrol q.h.s., prescribed by Dr. Jenean Lindau and Dr. Dory Peru at the [facility].  DRUG ALLERGIES:  Allergic to PENICILLIN, she gets a rash.  PHYSICAL EXAMINATION:  Vital signs are within normal limits.  LABORATORY DATA:  CBC is within normal limits.  Valproic acid level is 26.0. Her urine drug screen was positive for benzodiazepines.  Her urinalysis  showed a positive urinary tract infection.  MENTAL STATUS EXAMINATION:  She is an alert middle-aged white female, casually dressed, no eye contact, cooperative and calm.  Speech is normal and relevant.  Mood is depressed.  Affect is blunt.  Thought processes are coherent.  No evidence of psychosis.  She currently denies any suicidal or homicidal ideation.  No auditory or visual hallucinations.  No paranoia, no flight of ideas.  Cognitively, her function is intact.  Memory is fair.  Her judgment is poor  Insight is fair.  ADMISSION DIAGNOSES: Axis I:    Major depressive disorder. Axis II:   Deferred. Axis III:  Hypertension. Axis IV:   Moderate with grief issues. Axis V:    Current global assessment of functioning 30, past year is 55.  HOSPITAL COURSE:  The patient was admitted to [facility] for depression and suicidal ideation and the patient was to contract for safety and be monitored every 15 minutes.  The patient was initially placed on a one-to-one for patient agitation and patient safety.  Zidus 10 mg was initiated and her Prozac was resumed.  A Depakote level was ordered.  The patient initially was tending to be disorganized and developing either a dissociative state or floridly psychotic behavior.  Medication was increased . The one-to-one monitoring was continued.  Ativan was also ordered for agitation and Septra was ordered for her urinary tract infection.  The  patient began to become calmer, stated she was feeling better.  The patient began to stabilize and categorically denying any suicidal or self-mutilation ideation and was tolerating her medications well.  Her self-mutilation thoughts were resolving.  The patient was less agitated.  Husband stated he was pleased with the patients progress.  The patient was sleeping well and showing subjective improvement.  CONDITION ON DISCHARGE:  The patient was optimally improved.  She was tolerating her medications  well.  She was self motivated.  Impulses have totally resolved.  It was felt the patient could be managed on an outpatient basis.  FOLLOW-UP PLANS: 1. The patient was to follow-up with Dr. Jenean Lindau.  Phone number and appointment    was made. 2. The patient was also instructed to get a Depakote level, serum amylase,    liver profile and a CBC.  DISCHARGE MEDICATIONS:  Prozac 40 mg 1 p.o. b.i.d., Zyprexa 2.5 mg p.o. b.i.d., Zyprexa 10 mg 1 p.o. q.h.s., Depakote ER 500 mg 1 p.o. q.h.s., lorazepam 2 mg t.i.d., Septra double strength x 2 daily for five more days, Norvasc 5 mg q1 daily.  DIET AND ACTIVITY:  No restrictions in activity or diet.  DISCHARGE DIAGNOSES: axis I:    Major depressive disorder. Axis II:   Deferred. Axis III:  Hypertension. axis IV:   Moderate with grief issues. Axis V:    Current global assessment of functioning 55, this past year is 55. DD:  09/08/00 TD:  09/09/00 Job: [id number]

## 2010-12-12 NOTE — Discharge Summary (Signed)
NAME:  Adrienne Bennett, Adrienne Bennett                        ACCOUNT NO.:  [id number]   MEDICAL RECORD NO.:  [id number]                   PATIENT TYPE:  IPS   LOCATION:  0401                                 FACILITY:  [facility]   PHYSICIAN:  Jeanice Lim, M.D.              DATE OF BIRTH:  November 14, 1960   DATE OF ADMISSION:  12/12/2002  DATE OF DISCHARGE:  12/19/2002                                 DISCHARGE SUMMARY   IDENTIFYING DATA:  This is a 50 year old married Caucasian female,  involuntarily committed, presenting with a history of depression, reporting  that her angry alter came out, describing multiple personalities including  an 48-year-old girl and one who is angry and likes to make her bleed.  She  went to a convenience store prior to admission, bought a razor and cut her  left arm prior to being seen at the hospital.   ADMISSION MEDICATIONS:  Zoloft 200 mg q.a.m., Depakote ER 250 mg 3 q.h.s.,  Neurontin 300 mg b.i.d. for restless legs syndrome, and Crestor 10 mg  t.i.d., PreviDent tooth paste and chlorazepate 5 mg in the morning and 2  q.h.s.  Versus 15 mg in the morning and 30 q.h.s.   ALLERGIES:  PENICILLIN.   PHYSICAL EXAMINATION:  Essentially within normal limits, neurologically  nonfocal.   ROUTINE ADMISSION LABS:  Essentially within normal limits.   MENTAL STATUS EXAM:  Alert, middle-aged, cooperative female, fair eye  contact.  Speech clear.  The patient was upset and guilty over what she had  done.  Affect is constricted, then becoming tearful.  Thought process goal  directed, thought content:  No psychotic symptoms other than feeling that  her personality was changing rapidly and that at times she could only  understand and speak like an 4-year-old girl.  Cognitively intact.  Judgment  and insight fair with a history of poor impulse control.   ADMISSION DIAGNOSES:   AXIS I:  1. Major depressive disorder.  2. Anxiety disorder not otherwise specified.   AXIS II:  Rule out  borderline personality disorder.   AXIS III:  Hypercholesterolemia and hypertension.   AXIS IV:  Moderate, problems with primary support group and chronic mental  illness.   AXIS V:  25/55-60.   HOSPITAL COURSE:  The patient was admitted and ordered routine p.r.n.  medications, underwent further monitoring, and was encouraged to participate  in individual, group and milieu therapy.  The patient changed personalities  multiple times, was quite agitated initially and was optimized on Geodon,  required IM Geodon and also Haldol and Ativan IM for agitation aggressive  and dangerous behavior, threatening to hurt herself.  Depakote was then  optimized and the level followed, and Haldol scheduled.  The patient  reported positive response to medication changes, was no longer experiencing  this personality that made her want to hurt herself, and denied side effects  from the medications.   CONDITION ON DISCHARGE:  Markedly improved.  Mood was more euthymic, affect  brighter, thought process goal directed.  Thought content negative for  dangerous ideation or psychotic symptoms.  The patient reported motivation  to be compliant with the aftercare plan and felt safe and in control.   DISCHARGE MEDICATIONS:  1. Hydrochlorothiazide 25 mg q.a.m.  2. Lipitor 10 mg q.6 p.m.  3. Zoloft 100 mg 2 q.a.m.  4. Tranxene 15 mg q.a.m. and 2 q.h.s.  5. Geodon 80 mg b.i.d.  6. Depakote ER 500 mg 2 q.h.s., 1 q.a.m.  7. Haldol 2 mg q.a.m. and 4 mg q.h.s.  8. Neurontin 300 mg q.a.m. and 2 q.h.s.   DISPOSITION:  The patient was to follow up with Dr. Donell Beers June 11 at 4 and  Dr. Curly Shores.   DISCHARGE DIAGNOSES:   AXIS I:  1. Major depressive disorder.  2. Anxiety disorder not otherwise specified.   AXIS II:  Rule out borderline personality disorder.   AXIS III:  Hypercholesterolemia and hypertension.   AXIS IV:  Moderate, problems with primary support group and chronic mental  illness.   AXIS V:   Global assessment of function on discharge was 50-55.                                                 Jeanice Lim, M.D.    JEM/MEDQ  D:  01/16/2003  T:  01/16/2003  Job:  [id number]
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
## 2010-12-12 NOTE — H&P (Signed)
NAME:  Adrienne Bennett, Adrienne Bennett                        ACCOUNT NO.:  [id number]   MEDICAL RECORD NO.:  [id number]                   PATIENT TYPE:  EMS   LOCATION:  ED                                   FACILITY:  [facility]   PHYSICIAN:  Kela Millin, M.D.             DATE OF BIRTH:  1961/07/12   DATE OF ADMISSION:  12/23/2003  DATE OF DISCHARGE:                                HISTORY & PHYSICAL   CHIEF COMPLAINT:  Drug overdose.   HISTORY OF PRESENT ILLNESS:  The patient is a 50 year old white female with  a past medical history significant for depression and history of suicide  attempt, risk of cutting, who presents with complaints of having taken 60  tablets of her perphenazine.  The patient came to the emergency room  accompanied by her husband and reported that she had taken the pills 15  minutes prior to arrival.  She denies suicidal and homicidal ideation.  She  states that she had not been taking her medications for about a week and  that my mind does not work right when I do not take my medications.  Per  husband, the patient has done this before, and also she has cut her wrists  in the past.  He states that she did this right in front of the hospital in  the past.   Patient denies nausea, vomiting, dysuria, hematuria, chest pain, melena,  diarrhea, constipation.  Also denies cough and fever.   She states that her medications are prescribed by Dr. Shanon Brow with [facility].   PAST MEDICAL HISTORY:  1. As stated above.  2. Patient denies a history of hypertension or diabetes.  Also denies a     history of seizures.   MEDICATIONS:  1. Prozac.  2. Perphenazine.  3. Cogentin.  4. Depakote.  5. They do not know the dosages of these medications.   ALLERGIES:  PENICILLIN.  It causes a rash.   SOCIAL HISTORY:  Denies tobacco.  Also denies alcohol and illicit drugs.   FAMILY HISTORY:  Mother has diabetes mellitus.  Also, sister has diabetes.  Her  brother has had a myocardial infarction.   REVIEW OF SYSTEMS:  As per HPI.  Other comprehensive review of systems  negative.   PHYSICAL EXAMINATION:  VITAL SIGNS:  Temperature 100, blood pressure 148/70,  pulse 101, respiratory rate 24, O2 sat is 97%.  GENERAL:  Patient is a middle-aged white female sitting upright on the  stretcher.  Flat affect.  In no acute distress.  HEENT:  PERRL.  EOMI.  Sclerae are anicteric.  Oral mucosa is faint black  from charcoal.  NECK:  Supple.  No adenopathy.  No thyromegaly.  LUNGS:  Clear to auscultation bilaterally.  No crackles or wheezes.  HEART:  Normal S1 and S2.  Regular rate and rhythm.  ABDOMEN:  Soft.  Bowel sounds present.  Nontender.  Nondistended.  No  organomegaly.  EXTREMITIES:  No clubbing, cyanosis or edema.  NEUROLOGIC:  Alert and oriented.  Cranial nerves II-XII are grossly intact.  Nonfocal exam.   LABORATORY:  Her white cell count is 6.7, hemoglobin 14.5, hematocrit 41.5.  The platelet count is 258 with 63 neutrophils.  Her sodium is 134, potassium  3.5.  Chloride is 105.  CO2 is 23.  Glucose is 129.  BUN is 11 with a  creatinine of 0.6.  Her calcium is 9.3, AST 22, ALT 25, alkaline phosphatase  39.  Total bilirubin is 0.6, and her albumin is 3.8.   Her EKG shows a normal sinus rhythm with no ischemic changes.  No QRS  widening.   ASSESSMENT/PLAN:  1. Drug overdose:  Patient presented about 15 minutes after taking 60     tablets of perphenazine and was given charcoal in the ER.  Poison Control     was called, and supportive care recommended.  Will admit to the intensive     care unit and monitor for sedation, hypotension, and QRS widening.  We     will also obtain an aspirin level as well as a urine drug screen.     Tylenol level currently pending.  Will follow.  Will continue IV fluids.     Behavioral health called.  Will also check a Depakote level.  2. History of depression/psychiatric:  Consult psychiatry.                                                Kela Millin, M.D.    ACV/MEDQ  D:  12/23/2003  T:  12/23/2003  Job:  [id number]

## 2010-12-12 NOTE — Discharge Summary (Signed)
   NAME:  Adrienne Bennett, Adrienne Bennett                        ACCOUNT NO.:  [id number]   MEDICAL RECORD NO.:  [id number]                   PATIENT TYPE:  IPS   LOCATION:  0401                                 FACILITY:  [facility]   PHYSICIAN:  Jeanice Lim, M.D.              DATE OF BIRTH:  06/07/1961   DATE OF ADMISSION:  12/27/2002  DATE OF DISCHARGE:  12/31/2002                                 DISCHARGE SUMMARY   This is a Dr. Lourdes Sledge dictation.                                               Jeanice Lim, M.D.    JEM/MEDQ  D:  02/07/2003  T:  02/08/2003  Job:  [id number]

## 2010-12-12 NOTE — Discharge Summary (Signed)
NAME:  Adrienne Bennett, Adrienne Bennett                        ACCOUNT NO.:  [id number]   MEDICAL RECORD NO.:  [id number]                   PATIENT TYPE:  IPS   LOCATION:  0401                                 FACILITY:  [facility]   PHYSICIAN:  Hipolito Bayley, M.D.               DATE OF BIRTH:  09-11-1960   DATE OF ADMISSION:  12/27/2002  DATE OF DISCHARGE:  12/31/2002                                 DISCHARGE SUMMARY   INTRODUCTION:  The patient is a 50 year old white married female who was  admitted on voluntary papers due to depression.  The patient had the idea to  hurt herself with bundle skewer.  She was doubtful whether she wanted to  kill herself but had the urge to inflict the injury upon herself.  He was  recently hospitalized at [facility] with similar problems.  The patient complained of insomnia, feeling stressed out, anxious.   PAST PSYCHIATRIC HISTORY:  She was hospitalized in the past at our place as  well as [facility] and [facility]  She has seen Adrienne Bennett on an outpatient basis.   HOSPITAL COURSE:  Upon admitting to the ward, patient was placed on special  observation and her previous medications were reintroduced, mainly Depakote,  Geodon, Tranxene, perphenazine and Neurontin as well as Haldol and Zoloft.   The patient tolerated medication well.  On December 30, 2002, she still  complained of low mood and a lot of anxiety but denied ideas to hurt  herself.  On December 31, 2002, social worker attempted to contact the patient's  husband, Adrienne Bennett, for family meeting but there was initially no answer.  Later, social worker spoke to patient's husband, who saw the patient the  previous evening and felt that she is at her baseline and ready for  discharge.  The patient denied suicidal or homicidal ideation,  hallucinations or any urges to hurt herself.  She was agreeable with the  idea of discharge.  At the time of discharge, she felt she was doing better  and was  eager to go home and follow with her psychiatrist.   MEDICAL PROBLEMS:  Vital signs were stable.   LABORATORY DATA:  Routine chemistry was normal.  Chemistry 17 was normal.  Valproic acid level was 53.1, which was therapeutic.  CBC was normal.  There  was elevation of fasting blood sugar of 122 with normal up to 110.  The  patient was advised to follow with her family physician and recheck blood  sugar on an outpatient basis.   DISCHARGE DIAGNOSES:   AXIS I:  1. Major depression.  2. Post-traumatic stress disorder.  3. Dissociative identity disorder.   AXIS II:  Personality disorder not otherwise specified with strong  borderline features.   AXIS III:  No diagnosis.   AXIS IV:  Moderate stressors.   AXIS V:  Global Assessment of Functioning at the time of admission  30;  maximum for past year 59; upon discharge 60.   FOLLOW UP:  The patient is going to see Dr. Lourdes Sledge and Dr. Ellery Plunk.  She  is supposed to contact the case manager on Monday since discharge was  accomplished on Sunday to get her appointment.  Should thoughts of hurting  herself return, patient should come to the emergency room.  She was informed  of possible side effects of medication and she should inform her  psychiatrist if any side effects occur or come to the emergency room.   DISCHARGE MEDICATIONS:  1. Depakote ER 500 mg at bedtime.  2. Neurontin 300 mg three times a day.  3. Prozac weekly 90 mg 1 tablet every seven days.  4. Haldol 10 mg, 1/2 tablet at noon and 1 tablet at bedtime.  5. Cogentin 1 mg twice a day as needed for side effects.  6. Hydrochlorothiazide 25 mg daily.   DIET:  The patient should watch her diet and introduce high carbohydrates.   FOLLOW UP:  As mentioned before, she should check with her family physician  for elevated blood sugar.   The patient understood instruction and, in good condition, was discharged in  care of her husband.                                                Hipolito Bayley, M.D.    JS/MEDQ  D:  03/16/2003  T:  03/17/2003  Job:  [id number]
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
## 2010-12-28 ENCOUNTER — Other Ambulatory Visit: Payer: Self-pay | Admitting: Internal Medicine

## 2011-01-27 ENCOUNTER — Other Ambulatory Visit: Payer: Medicare Other

## 2011-02-03 ENCOUNTER — Encounter: Payer: Self-pay | Admitting: Internal Medicine

## 2011-02-03 ENCOUNTER — Other Ambulatory Visit: Payer: Self-pay | Admitting: Internal Medicine

## 2011-02-03 ENCOUNTER — Ambulatory Visit (INDEPENDENT_AMBULATORY_CARE_PROVIDER_SITE_OTHER): Payer: Medicare Other | Admitting: Internal Medicine

## 2011-02-03 DIAGNOSIS — E119 Type 2 diabetes mellitus without complications: Secondary | ICD-10-CM

## 2011-02-03 DIAGNOSIS — I10 Essential (primary) hypertension: Secondary | ICD-10-CM

## 2011-02-03 DIAGNOSIS — M542 Cervicalgia: Secondary | ICD-10-CM

## 2011-02-03 HISTORY — DX: Cervicalgia: M54.2

## 2011-02-03 NOTE — Assessment & Plan Note (Signed)
On diet control only, will get a hemoglobin A1c tomorrow

## 2011-02-03 NOTE — Assessment & Plan Note (Addendum)
came today with "ear pain", on further examination there is no ear infection; she has tendernes at the  [facility] (see graphic) . Will treat as a muscle ache, see instructions.

## 2011-02-03 NOTE — Assessment & Plan Note (Signed)
To control, will get extensive bloodwork tomorrow as ordered by her psychiatrist including a BMP.

## 2011-02-03 NOTE — Progress Notes (Signed)
  Subjective:    Patient ID: Adrienne Bennett, female    DOB: 09-06-1960, 50 y.o.   MRN: [id number]  HPI Tension headache yesterday, nothing unusual, better today. The reason she is here today, is because she woke up with a "left ear pain", the pain at this time (11:50 AM) is better.  Past Medical History  Diagnosis Date  . Depression     sess Dr.Plovsky  . 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 Diabetes mellitus 2007  . IBS (irritable bowel syndrome)   . Migraine   . Hypertension   . SLE (systemic lupus erythematosus)     ??SLE -- as Dx by nephrology aprox 2007 , saw rheumatology who disagreed w/  Dx , was told she does not have Lupus --- (per pt )    Past Surgical History  Procedure Date  . Tubal ligation   . Tonsillectomy   . Carpal tunnel release      Review of Systems Denies fever, runny nose or sore throat. No watery eyes No discharge from the ear.     Objective:   Physical Exam  Constitutional: She appears well-developed and well-nourished. No distress.  HENT:  Head: Normocephalic and atraumatic.    Right Ear: External ear normal.  Left Ear: External ear normal.  Nose: Nose normal.  Mouth/Throat: No oropharyngeal exudate.       Dental examination showed no tenderness to percussion. No tenderness to the mastoid percussion  Skin: She is not diaphoretic.          Assessment & Plan:

## 2011-02-03 NOTE — Patient Instructions (Signed)
Warm compress to the neck Tylenol 500 mg 2 tablets every 6 hours as needed Call if pain severe, swelling in the area, no better in few days  Please add a A1C to the labs you have schedule for tomorrow----dx DM

## 2011-02-04 ENCOUNTER — Other Ambulatory Visit (INDEPENDENT_AMBULATORY_CARE_PROVIDER_SITE_OTHER): Payer: Medicare Other

## 2011-02-04 DIAGNOSIS — E119 Type 2 diabetes mellitus without complications: Secondary | ICD-10-CM

## 2011-02-04 LAB — COMPREHENSIVE METABOLIC PANEL
Albumin: 4 g/dL (ref 3.5–5.2)
Alkaline Phosphatase: 52 U/L (ref 39–117)
BUN: 16 mg/dL (ref 6–23)
CO2: 32 mEq/L (ref 19–32)
GFR: 169.14 mL/min (ref >60.00)
Glucose, Bld: 98 mg/dL (ref 70–99)
Sodium: 141 mEq/L (ref 135–145)
Total Bilirubin: 0.1 mg/dL — ABNORMAL LOW (ref 0.3–1.2)
Total Protein: 7.6 g/dL (ref 6.0–8.3)

## 2011-02-04 LAB — CBC WITH DIFFERENTIAL/PLATELET
Basophils Absolute: 0 10*3/uL (ref 0.0–0.1)
Eosinophils Absolute: 0.1 10*3/uL (ref 0.0–0.7)
HCT: 40.6 % (ref 36.0–46.0)
Hemoglobin: 14.1 g/dL (ref 12.0–15.0)
Lymphs Abs: 1.5 10*3/uL (ref 0.7–4.0)
MCHC: 34.8 g/dL (ref 30.0–36.0)
Neutro Abs: 1.9 10*3/uL (ref 1.4–7.7)
Platelets: 270 10*3/uL (ref 150.0–400.0)
RDW: 13 % (ref 11.5–14.6)

## 2011-02-04 LAB — LIPID PANEL
Cholesterol: 208 mg/dL — ABNORMAL HIGH (ref 0–200)
Triglycerides: 94 mg/dL (ref 0.0–149.0)
VLDL: 18.8 mg/dL (ref 0.0–40.0)

## 2011-02-04 LAB — HEMOGLOBIN A1C: Hgb A1c MFr Bld: 5.9 % (ref 4.6–6.5)

## 2011-02-04 NOTE — Progress Notes (Signed)
Labs only

## 2011-02-06 ENCOUNTER — Telehealth: Payer: Self-pay | Admitting: *Deleted

## 2011-02-06 MED ORDER — SIMVASTATIN 40 MG PO TABS: 40.0000 mg | ORAL_TABLET | Freq: Every evening | ORAL | Status: DC

## 2011-02-06 NOTE — Telephone Encounter (Signed)
Message copied by Leanne Lovely on Fri Feb 06, 2011  1:38 PM ------      Message from: Adrienne Bennett      Created: Fri Feb 06, 2011  1:14 PM       Send results to Dr.Plovsky , who ordered the tests.      Also tell pt  from my standpoint:      --She has borderline diabetes      --Cholesterol definitely needs improvement. In addition to a healthier diet and more exercise I recommend simvastatin 40 mg 1 by mouth each bedtime,: #30 and 3 rf      Arrange for an FLP, AST, ALT in 6 weeks if the patient is willing to try medicine.

## 2011-02-06 NOTE — Telephone Encounter (Signed)
I spoke w/ pt she is aware, meds sent in. Labs also sent to Dr.Plovsky.

## 2011-03-05 ENCOUNTER — Other Ambulatory Visit: Payer: Self-pay | Admitting: Internal Medicine

## 2011-05-01 ENCOUNTER — Other Ambulatory Visit: Payer: Self-pay | Admitting: Internal Medicine

## 2011-05-08 LAB — BASIC METABOLIC PANEL
BUN: 17
CO2: 30
Calcium: 9
Chloride: 104
Creatinine, Ser: 0.45
GFR calc non Af Amer: >60
Glucose, Bld: 115 — ABNORMAL HIGH

## 2011-07-08 ENCOUNTER — Ambulatory Visit (INDEPENDENT_AMBULATORY_CARE_PROVIDER_SITE_OTHER): Payer: Medicare Other

## 2011-07-08 DIAGNOSIS — Z23 Encounter for immunization: Secondary | ICD-10-CM

## 2011-11-11 ENCOUNTER — Other Ambulatory Visit: Payer: Self-pay | Admitting: Internal Medicine

## 2011-11-27 ENCOUNTER — Other Ambulatory Visit: Payer: BC Managed Care – PPO

## 2011-11-27 DIAGNOSIS — F3189 Other bipolar disorder: Secondary | ICD-10-CM

## 2012-03-01 ENCOUNTER — Other Ambulatory Visit: Payer: Self-pay | Admitting: Internal Medicine

## 2012-03-02 NOTE — Telephone Encounter (Signed)
Refill done.  

## 2012-04-22 ENCOUNTER — Ambulatory Visit (INDEPENDENT_AMBULATORY_CARE_PROVIDER_SITE_OTHER): Payer: BC Managed Care – PPO | Admitting: Internal Medicine

## 2012-04-22 VITALS — BP 156/92 | HR 92 | Temp 98.3°F | Wt 172.0 lb

## 2012-04-22 DIAGNOSIS — Z23 Encounter for immunization: Secondary | ICD-10-CM

## 2012-04-22 DIAGNOSIS — R21 Rash and other nonspecific skin eruption: Secondary | ICD-10-CM

## 2012-04-22 DIAGNOSIS — I10 Essential (primary) hypertension: Secondary | ICD-10-CM

## 2012-04-22 MED ORDER — CLOTRIMAZOLE-BETAMETHASONE 1-0.05 % EX CREA: TOPICAL_CREAM | Freq: Two times a day (BID) | CUTANEOUS | Status: DC

## 2012-04-22 NOTE — Assessment & Plan Note (Signed)
BP slightly elevated today, she is asymptomatic, recommend good compliance with meds. She is due for a followup, encouraged to schedule a physical.

## 2012-04-22 NOTE — Progress Notes (Signed)
  Subjective:    Patient ID: Adrienne Bennett, female    DOB: 1961/02/04, 51 y.o.   MRN: [id number]  HPI Acute visit Developed a rash about a week ago, it itches, has use Neosporin over-the-counter without much success. This is not the first time she has his rash, often times related to warm weather and sweating. BP is noted to be slightly elevated, reports good compliance with medications except for today when she forgot to take them.  Past Medical History  Diagnosis Date  . Depression     sess Dr.Plovsky  . 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 Diabetes mellitus 2007  . IBS (irritable bowel syndrome)   . Migraine   . Hypertension   . SLE (systemic lupus erythematosus)     ??SLE -- as Dx by nephrology aprox 2007 , saw rheumatology who disagreed w/  Dx , was told she does not have Lupus --- (per pt )   Past Surgical History  Procedure Date  . Tubal ligation   . Tonsillectomy   . Carpal tunnel release     Review of Systems Medicication list reviewed, taking same meds as before except mysoline. In general feels well, denies any fever chills. No headache or chest pain.     Objective:   Physical Exam  Constitutional: She appears well-developed. No distress.  Skin: She is not diaphoretic.     Psychiatric: She has a normal mood and affect. Her behavior is normal. Judgment and thought content normal.          Assessment & Plan:  Rash, Likely a fungal infection, will prescribe Lotrisone, see instructions

## 2012-04-22 NOTE — Patient Instructions (Addendum)
Apply the cream twice a day for at least 10 days Once is better, keep the area dry with powder Your BPs are slightly elevated today, check the  blood pressure 2 or 3 times a week, be sure it is between 110/60 and 140/85. If it is consistently higher or lower, let me know You are due for a physical, please schedule it at your earliest convenience

## 2012-04-25 ENCOUNTER — Encounter: Payer: Self-pay | Admitting: Internal Medicine

## 2012-05-29 ENCOUNTER — Other Ambulatory Visit: Payer: Self-pay | Admitting: Internal Medicine

## 2012-05-30 ENCOUNTER — Ambulatory Visit (INDEPENDENT_AMBULATORY_CARE_PROVIDER_SITE_OTHER): Payer: BC Managed Care – PPO | Admitting: Internal Medicine

## 2012-05-30 ENCOUNTER — Encounter: Payer: Self-pay | Admitting: Internal Medicine

## 2012-05-30 VITALS — BP 134/82 | HR 77 | Temp 98.6°F | Wt 177.0 lb

## 2012-05-30 DIAGNOSIS — R05 Cough: Secondary | ICD-10-CM

## 2012-05-30 DIAGNOSIS — R059 Cough, unspecified: Secondary | ICD-10-CM

## 2012-05-30 MED ORDER — ALBUTEROL SULFATE HFA 108 (90 BASE) MCG/ACT IN AERS: 2.0000 | INHALATION_SPRAY | Freq: Four times a day (QID) | RESPIRATORY_TRACT | Status: DC | PRN

## 2012-05-30 MED ORDER — AZITHROMYCIN 250 MG PO TABS: ORAL_TABLET | ORAL | Status: DC

## 2012-05-30 NOTE — Telephone Encounter (Signed)
Refill done.  

## 2012-05-30 NOTE — Patient Instructions (Addendum)
Rest, fluids , tylenol For cough, take Mucinex DM twice a day as needed  Ventolin: 2 puffs every 6 hours for 2 days, then as needed for cough-wheezing  Take the antibiotic as prescribed  (zithromax) Call if no better in few days Call anytime if the symptoms are severe ----  Here for a routine checkup at your earliest convenience. Once you are better, get a flu shot
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
## 2012-06-16 ENCOUNTER — Telehealth: Payer: Self-pay | Admitting: Internal Medicine

## 2012-06-16 NOTE — Telephone Encounter (Signed)
Patient states that she was seen a couple of weeks ago for a cold and is still having the "dry cough". She would like to know if Dr. [name] would call her in something to [facility] on [address]

## 2012-06-17 NOTE — Telephone Encounter (Signed)
Please advise 

## 2012-06-17 NOTE — Telephone Encounter (Signed)
Patient called again regarding cough medication. She can only be reached at [phone]. Call before 10 or after 3.

## 2012-06-21 MED ORDER — DOXYCYCLINE HYCLATE 100 MG PO TABS: 100.0000 mg | ORAL_TABLET | Freq: Two times a day (BID) | ORAL | Status: DC

## 2012-06-21 MED ORDER — HYDROCODONE-HOMATROPINE 5-1.5 MG/5ML PO SYRP: 5.0000 mL | ORAL_SOLUTION | Freq: Three times a day (TID) | ORAL | Status: DC | PRN

## 2012-06-21 NOTE — Telephone Encounter (Signed)
Discussed with pt

## 2012-06-21 NOTE — Telephone Encounter (Signed)
Advise patient, I just send a prescription for a second round of antibiotics, doxycycline. Also sent a cough suppressant, hydrocodone, advise her it  will make her sleepy. If not improving by next week or  symptoms severe needs to be seen. Continue using albuterol as needed for cough and wheezing

## 2012-06-22 ENCOUNTER — Telehealth: Payer: Self-pay | Admitting: Internal Medicine

## 2012-06-22 NOTE — Telephone Encounter (Signed)
Patient calling about the Hydrocodone that was recently given to her.  Was told by another MD that this should not be taken this with her other prescribed medications.  This was Dr. Evette Cristal.   Please advise.    States that she can only use generic medications due to her insurance.

## 2012-06-22 NOTE — Telephone Encounter (Signed)
Discussed with pt

## 2012-06-22 NOTE — Telephone Encounter (Signed)
As long as she takes a low dose (as prescribed) for few days, I think that is not a problem. If she has side effects or problems let me know

## 2012-06-22 NOTE — Telephone Encounter (Signed)
Please advise 

## 2012-07-08 ENCOUNTER — Ambulatory Visit (INDEPENDENT_AMBULATORY_CARE_PROVIDER_SITE_OTHER): Payer: BC Managed Care – PPO | Admitting: Family Medicine

## 2012-07-08 ENCOUNTER — Telehealth: Payer: Self-pay | Admitting: Internal Medicine

## 2012-07-08 ENCOUNTER — Encounter: Payer: Self-pay | Admitting: Family Medicine

## 2012-07-08 VITALS — BP 150/70 | HR 105 | Temp 98.4°F | Ht 59.75 in | Wt 174.0 lb

## 2012-07-08 DIAGNOSIS — M549 Dorsalgia, unspecified: Secondary | ICD-10-CM

## 2012-07-08 DIAGNOSIS — I10 Essential (primary) hypertension: Secondary | ICD-10-CM

## 2012-07-08 MED ORDER — MELOXICAM 15 MG PO TABS: 15.0000 mg | ORAL_TABLET | Freq: Every day | ORAL | Status: DC

## 2012-07-08 MED ORDER — CYCLOBENZAPRINE HCL 10 MG PO TABS: 10.0000 mg | ORAL_TABLET | Freq: Two times a day (BID) | ORAL | Status: DC | PRN

## 2012-07-08 NOTE — Telephone Encounter (Signed)
Pt came back in and would like rx sent to [facility] at [address]

## 2012-07-08 NOTE — Telephone Encounter (Signed)
I spoke with patient to clarify message. Patient states she jus would like to make sure we have the correct pharmacy on file for her. Pharmacy on file is correct

## 2012-07-08 NOTE — Patient Instructions (Addendum)
Follow up w/ Dr [name] in 3-4 weeks to recheck BP and back pain Start the Flexeril nightly- during the day if needed but it may cause drowsiness (muscle relaxer) Start the Mobic once daily for inflammation- take w/ food.  Don't take any additional ibuprofen or aleve.  You can add tylenol HEAT! Call with any questions or concerns Hang in there! Happy Holidays!!!

## 2012-07-08 NOTE — Progress Notes (Signed)
  Subjective:    Patient ID: Adrienne Bennett, female    DOB: [birth year], 51 y.o.   MRN: [id number]  HPI Back pain- pt reports she had scoliosis in HS and required a back brace.  Pt reports 'it has gotten much worse'.  Pt reports she is unable to straighten her back in the morning for 'quite some time'.  Pt has to be aware of how she turns or reaches due to pain.  Prior to menses 'i might as well stay in bed for 3 days'.  Also having R hip pain.  Does not see ortho.  Pt reports pain for >1 yr.  Has taken tylenol/ibuprofen but 'i'm not sure' if it provides relief.  No numbness or tingling of legs.  No bowel or bladder incontinence.   Review of Systems For ROS see HPI     Objective:   Physical Exam  Vitals reviewed. Constitutional: She is oriented to person, place, and time. She appears well-developed and well-nourished. No distress.  Musculoskeletal: She exhibits no edema.       Pt w/ L thoracic curve + TTP and spasm over R lumbar paraspinal muscle Limited forward flexion, good back extension (-) SLR bilaterally  Neurological: She is alert and oriented to person, place, and time. She has normal reflexes.          Assessment & Plan:

## 2012-07-10 NOTE — Assessment & Plan Note (Signed)
Deteriorated.  Likely due to pt's current level of pain.  No med changes at this time but stressed close f/u w/ PCP.  Pt expressed understanding and is in agreement w/ plan.

## 2012-07-10 NOTE — Assessment & Plan Note (Signed)
New to provider.  Pt's current pain is muscular- likely due to curvature of spine putting excessive tension/strain on R lumbar muscles.  Start daily NSAID, add muscle relaxer.  Pt to f/u w/ PCP and determine if current tx is working or whether she requires ortho referral.  Reviewed supportive care and red flags that should prompt return.  Pt expressed understanding and is in agreement w/ plan.

## 2012-07-12 ENCOUNTER — Encounter: Payer: Self-pay | Admitting: Lab

## 2012-07-13 ENCOUNTER — Ambulatory Visit (INDEPENDENT_AMBULATORY_CARE_PROVIDER_SITE_OTHER): Payer: BC Managed Care – PPO | Admitting: Internal Medicine

## 2012-07-13 ENCOUNTER — Encounter: Payer: Self-pay | Admitting: Internal Medicine

## 2012-07-13 VITALS — BP 130/84 | HR 80 | Temp 98.2°F | Ht 61.25 in | Wt 179.0 lb

## 2012-07-13 DIAGNOSIS — I10 Essential (primary) hypertension: Secondary | ICD-10-CM

## 2012-07-13 DIAGNOSIS — Z0000 Encounter for general adult medical examination without abnormal findings: Secondary | ICD-10-CM

## 2012-07-13 DIAGNOSIS — Z23 Encounter for immunization: Secondary | ICD-10-CM

## 2012-07-13 DIAGNOSIS — M549 Dorsalgia, unspecified: Secondary | ICD-10-CM

## 2012-07-13 DIAGNOSIS — E119 Type 2 diabetes mellitus without complications: Secondary | ICD-10-CM

## 2012-07-13 HISTORY — DX: Encounter for general adult medical examination without abnormal findings: Z00.00

## 2012-07-13 MED ORDER — PROPRANOLOL HCL 10 MG PO TABS: 20.0000 mg | ORAL_TABLET | Freq: Two times a day (BID) | ORAL | Status: DC

## 2012-07-13 NOTE — Assessment & Plan Note (Signed)
Was not taking medications as prescribed, restarted lisinopril a week ago. Not taking Inderal at present. Ambulatory BPs elevated according to the patient Plan: Stay on lisinopril, restart Inderal, followup in 3 months
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
## 2012-07-13 NOTE — Assessment & Plan Note (Addendum)
Tdap-- today Had a flu shot Pneumonia shot 2010 Colonoscopy 07-2010, Dr. Randa Evens, had polyps, repeat in 3 years Has not seen gynecology in a while, refer to Dr. Arelia Sneddon Due for a mammogram, will order one. Diet exercise discussed

## 2012-07-13 NOTE — Assessment & Plan Note (Signed)
Had back pain for a while, much improved with Mobic and Flexeril, we'll leave those medications in her list  to be taken as needed

## 2012-07-13 NOTE — Assessment & Plan Note (Addendum)
Borderline diabetes, at some point A1c was 6.5, since then it has been 6 or below. Plan: Encourage diet and exercise, check a A1c

## 2012-07-13 NOTE — Patient Instructions (Addendum)
Please come back fasting: FLP, CMP, CBC, TSH -- Dx V70 A1c-- dx DM ----- Take Inderal and lisinopril as prescribed Check the  blood pressure 2 or 3 times a week, be sure it is between 110/60 and 135/80. If it is consistently higher or lower, let me know ---- Next office visit in 3 months

## 2012-07-18 ENCOUNTER — Encounter: Payer: Self-pay | Admitting: Internal Medicine

## 2012-07-22 ENCOUNTER — Encounter: Payer: Self-pay | Admitting: Internal Medicine

## 2012-08-02 ENCOUNTER — Other Ambulatory Visit: Payer: Self-pay | Admitting: Internal Medicine

## 2012-08-02 DIAGNOSIS — E119 Type 2 diabetes mellitus without complications: Secondary | ICD-10-CM

## 2012-08-02 DIAGNOSIS — Z0000 Encounter for general adult medical examination without abnormal findings: Secondary | ICD-10-CM

## 2012-08-03 ENCOUNTER — Other Ambulatory Visit: Payer: BC Managed Care – PPO

## 2012-08-15 ENCOUNTER — Telehealth: Payer: Self-pay | Admitting: Internal Medicine

## 2012-08-15 ENCOUNTER — Encounter: Payer: Self-pay | Admitting: *Deleted

## 2012-08-15 DIAGNOSIS — Z0000 Encounter for general adult medical examination without abnormal findings: Secondary | ICD-10-CM

## 2012-08-15 DIAGNOSIS — E119 Type 2 diabetes mellitus without complications: Secondary | ICD-10-CM

## 2012-08-15 MED ORDER — SIMVASTATIN 40 MG PO TABS: 40.0000 mg | ORAL_TABLET | Freq: Every evening | ORAL | Status: DC

## 2012-08-15 NOTE — Telephone Encounter (Signed)
°  NOTE NEW PHARMACY IS [facility]  REFILL SIMVASTATIN 40MG  NO INSTRUCTIONS Listed originaly typed as Mobic -- marked thru that and states Simvastatin 40mg

## 2012-08-15 NOTE — Telephone Encounter (Signed)
Not listed on pt's active med list. OK to refill?

## 2012-08-15 NOTE — Telephone Encounter (Signed)
She was taking it at the time of her last visit in December. Plan: Arrange for labs ordered @ time of CPX FLP, CMP, CBC, TSH -- Dx V70  A1c-- dx DM Ok RF 1 month No further RF w/o OV

## 2012-08-15 NOTE — Telephone Encounter (Signed)
Refill done. Letter mailed to pt to let her know she is do for a CPE.

## 2012-09-11 ENCOUNTER — Other Ambulatory Visit: Payer: Self-pay | Admitting: Internal Medicine

## 2012-09-12 NOTE — Telephone Encounter (Signed)
Refill done.  

## 2012-09-19 ENCOUNTER — Telehealth: Payer: Self-pay | Admitting: Internal Medicine

## 2012-09-19 MED ORDER — LISINOPRIL 10 MG PO TABS: ORAL_TABLET | ORAL | Status: DC

## 2012-09-19 NOTE — Telephone Encounter (Signed)
Refill done.  

## 2012-09-19 NOTE — Telephone Encounter (Signed)
refill Lisinopril (Tab) 10 MG TAKE 1 TABLET BY MOUTH DAILY #30 no last fill date listed

## 2012-10-11 ENCOUNTER — Encounter: Payer: Self-pay | Admitting: Lab

## 2012-10-12 ENCOUNTER — Ambulatory Visit (INDEPENDENT_AMBULATORY_CARE_PROVIDER_SITE_OTHER): Payer: BC Managed Care – PPO | Admitting: Internal Medicine

## 2012-10-12 ENCOUNTER — Encounter: Payer: Self-pay | Admitting: Internal Medicine

## 2012-10-12 VITALS — BP 136/82 | HR 80 | Temp 98.1°F | Wt 164.0 lb

## 2012-10-12 DIAGNOSIS — M549 Dorsalgia, unspecified: Secondary | ICD-10-CM

## 2012-10-12 DIAGNOSIS — I10 Essential (primary) hypertension: Secondary | ICD-10-CM

## 2012-10-12 DIAGNOSIS — E785 Hyperlipidemia, unspecified: Secondary | ICD-10-CM | POA: Insufficient documentation

## 2012-10-12 DIAGNOSIS — F319 Bipolar disorder, unspecified: Secondary | ICD-10-CM

## 2012-10-12 DIAGNOSIS — E119 Type 2 diabetes mellitus without complications: Secondary | ICD-10-CM

## 2012-10-12 HISTORY — DX: Hyperlipidemia, unspecified: E78.5

## 2012-10-12 LAB — CBC WITH DIFFERENTIAL/PLATELET
Eosinophils Relative: 0.9 % (ref 0.0–5.0)
HCT: 42.1 % (ref 36.0–46.0)
Lymphocytes Relative: 30.8 % (ref 12.0–46.0)
Lymphs Abs: 1.4 10*3/uL (ref 0.7–4.0)
Monocytes Relative: 9.1 % (ref 3.0–12.0)
Neutrophils Relative %: 58.7 % (ref 43.0–77.0)
Platelets: 222 10*3/uL (ref 150.0–400.0)
WBC: 4.7 10*3/uL (ref 4.5–10.5)

## 2012-10-12 LAB — COMPREHENSIVE METABOLIC PANEL
ALT: 25 U/L (ref 0–35)
AST: 28 U/L (ref 0–37)
Albumin: 3.6 g/dL (ref 3.5–5.2)
Alkaline Phosphatase: 56 U/L (ref 39–117)
BUN: 14 mg/dL (ref 6–23)
CO2: 29 mEq/L (ref 19–32)
Calcium: 9 mg/dL (ref 8.4–10.5)
Chloride: 97 mEq/L (ref 96–112)
Creatinine, Ser: 0.5 mg/dL (ref 0.4–1.2)
GFR: 128.46 mL/min (ref >60.00)
Glucose, Bld: 122 mg/dL — ABNORMAL HIGH (ref 70–99)
Potassium: 3.6 mEq/L (ref 3.5–5.1)
Sodium: 136 mEq/L (ref 135–145)
Total Bilirubin: 0.6 mg/dL (ref 0.3–1.2)
Total Protein: 7.7 g/dL (ref 6.0–8.3)

## 2012-10-12 LAB — LIPID PANEL
Cholesterol: 129 mg/dL (ref 0–200)
HDL: 33.6 mg/dL — ABNORMAL LOW (ref >39.00)
LDL Cholesterol: 79 mg/dL (ref 0–99)
Total CHOL/HDL Ratio: 4
Triglycerides: 82 mg/dL (ref 0.0–149.0)
VLDL: 16.4 mg/dL (ref 0.0–40.0)

## 2012-10-12 LAB — TSH: TSH: 0.84 u[IU]/mL (ref 0.35–5.50)

## 2012-10-12 LAB — SEDIMENTATION RATE: Sed Rate: 35 mm/hr — ABNORMAL HIGH (ref 0–22)

## 2012-10-12 NOTE — Assessment & Plan Note (Addendum)
Cont w/ "back pain", described as stiffness in AM Plan: Continue w/ mobic and  flexeril check a sed rate  Consider further w/u (at some point had the dx of SLE)

## 2012-10-12 NOTE — Progress Notes (Signed)
  Subjective:    Patient ID: Adrienne Bennett, female    DOB: [birth year], 52 y.o.   MRN: [id number]  HPI ROV, labs requested few months ago where not done but will be drawn today Her main concern is problems with her speech, reports that she has a difficult time finishing her sentences. This is going on for a while but more noticeable in the last month. Denies headaches, slurred speech per se, no facial numbness or focal weaknesses. No nausea, vomiting. No recent head injury. She also complained of "tremor", on my exam there is no tremor but fidgeting. Depression per se is well-controlled  High cholesterol, good compliance with simvastatin. Hypertension, good compliance with medicines, BP today is excellent, report normal ambulatory BPs. Also complained of back pain, this has been an on and off problem, the "pain" is described mostly as a stiffness in the morning the last 3 hours, decrease with Flexeril, symptoms started before starting statins. Denies any stiffness in her wrist or fingers.  Past Medical History  Diagnosis Date  . Depression     sess Dr.Plovsky  . 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 Diabetes mellitus 2007  . IBS (irritable bowel syndrome)     ? of IBS  . Migraine   . Hypertension   . SLE (systemic lupus erythematosus)     ??SLE -- as Dx by nephrology aprox 2007 , saw rheumatology who disagreed w/  Dx , was told she does not have Lupus --- (per pt )  . Other and unspecified hyperlipidemia 10/12/2012   Past Surgical History  Procedure Laterality Date  . Tubal ligation    . Tonsillectomy    . Carpal tunnel release       Review of Systems See history of present illness. Also denies chest pain  or shortness of breath.    Objective:   Physical Exam General -- alert, well-developed, no emotional distress .   Lungs -- normal respiratory effort, no  intercostal retractions, no accessory muscle use, and normal breath sounds.   Heart-- normal rate, regular rhythm, no murmur, and no gallop.  .   Extremities-- no pretibial edema bilaterally; inspection and palpation of wrists and hands show no synovitis.   Neurologic-- alert & oriented X3 ; speech is fluent  , gait normal, no slurred speech, pupils are symmetric and reactive to light, face symmetric, no overuse memory issues. No actual tremor but definitely fidgeting. Psych-- Cognition and judgment appear intact. Alert and cooperative with normal attention span and concentration.  not anxious appearing and not depressed appearing.         Assessment & Plan:

## 2012-10-12 NOTE — Assessment & Plan Note (Signed)
Due for labs

## 2012-10-12 NOTE — Assessment & Plan Note (Addendum)
Has a number of sx---> "memory problems" and fidgeting. Etiology not clear to me at this point. Will check a valproic acid level, I wonder if sx are side effects from the multiple meds she is on Pt will see Dr Donell Beers today , will fax this note

## 2012-10-12 NOTE — Assessment & Plan Note (Signed)
Well controlled, labs  

## 2012-10-12 NOTE — Assessment & Plan Note (Signed)
Started meds few months ago, althpough she is not fasting will check a FLP

## 2012-10-13 LAB — VALPROIC ACID LEVEL: Valproic Acid Lvl: 41.4 ug/mL — ABNORMAL LOW (ref 50.0–100.0)

## 2012-11-01 ENCOUNTER — Encounter: Payer: Self-pay | Admitting: Internal Medicine

## 2012-11-02 ENCOUNTER — Other Ambulatory Visit: Payer: Self-pay | Admitting: Internal Medicine

## 2012-11-02 NOTE — Telephone Encounter (Signed)
Refill done.  

## 2012-12-14 ENCOUNTER — Encounter: Payer: Self-pay | Admitting: Internal Medicine

## 2012-12-14 ENCOUNTER — Ambulatory Visit (INDEPENDENT_AMBULATORY_CARE_PROVIDER_SITE_OTHER): Payer: BC Managed Care – PPO | Admitting: Internal Medicine

## 2012-12-14 ENCOUNTER — Ambulatory Visit (INDEPENDENT_AMBULATORY_CARE_PROVIDER_SITE_OTHER)
Admission: RE | Admit: 2012-12-14 | Discharge: 2012-12-14 | Disposition: A | Discharge status: Home / Self Care | Payer: BC Managed Care – PPO | Source: Ambulatory Visit | Attending: Internal Medicine | Admitting: Internal Medicine

## 2012-12-14 VITALS — BP 138/88 | HR 88 | Temp 98.0°F | Wt 171.0 lb

## 2012-12-14 DIAGNOSIS — M25561 Pain in right knee: Secondary | ICD-10-CM

## 2012-12-14 DIAGNOSIS — M549 Dorsalgia, unspecified: Secondary | ICD-10-CM

## 2012-12-14 DIAGNOSIS — F319 Bipolar disorder, unspecified: Secondary | ICD-10-CM

## 2012-12-14 DIAGNOSIS — M25569 Pain in unspecified knee: Secondary | ICD-10-CM

## 2012-12-14 DIAGNOSIS — E119 Type 2 diabetes mellitus without complications: Secondary | ICD-10-CM

## 2012-12-14 NOTE — Progress Notes (Signed)
  Subjective:    Patient ID: Adrienne Bennett, female    DOB: 1961/06/19, 52 y.o.   MRN: [id number]  HPI Followup from previous visit.  She complained of back pain, symptoms resolved. She complained of memory issues, symptoms resolve. A1c was found to be 7, previous A1Cs better ; reports she is not exercising lately, and diet has not been very good at all. Valproic acid levelkwas low, dose was not change per psych, continue taking 4 tablets daily. Complained of right knee pain for the last month, symptoms started after a fall, pain is worse with weightbearing.  Past Medical History  Diagnosis Date  . Depression     sess Dr.Plovsky  . 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 Diabetes mellitus 2007  . IBS (irritable bowel syndrome)     ? of IBS  . Migraine   . Hypertension   . SLE (systemic lupus erythematosus)     ??SLE -- as Dx by nephrology aprox 2007 , saw rheumatology who disagreed w/  Dx , was told she does not have Lupus --- (per pt )  . Other and unspecified hyperlipidemia 10/12/2012   Past Surgical History  Procedure Laterality Date  . Tubal ligation    . Tonsillectomy    . Carpal tunnel release     Review of Systems No actual knee swelling or redness. Reports she fell because she sleep walk; Sleepwalking started 2 months ago, she's not taking any new medications, no recent head injury, no headache, nausea, dizziness.     Objective:   Physical Exam General -- alert, well-developed, NAD    Lungs -- normal respiratory effort, no intercostal retractions, no accessory muscle use, and normal breath sounds.   Heart-- normal rate, regular rhythm, no murmur, and no gallop.   Extremities--  no pretibial edema bilaterally Knees symmetric, range of motion normal, no redness or swelling, no deformities. Both knees are stable. Psych-- Cognition and judgment appear  intact. Alert and cooperative with normal attention span and concentration.  not anxious appearing and not depressed appearing.      Assessment & Plan:  Knee pain, Likely a contusion. X-ray Ice at night Use a brace. Will call in 3 weeks if not better, we'll need ortho eval.

## 2012-12-14 NOTE — Patient Instructions (Addendum)
Please get your x-ray at the other [facility]  office located at: [address] [place], across from [facility].  Please go to the basement, this is a walk-in facility, they are open from 8:30 to 5:30 PM. Phone number [phone]. --- Put ice On the R knee every night Use a over-the-counter knee brace Call in 3 weeks if you're not improving. --- Next visit in 3 months.

## 2012-12-14 NOTE — Assessment & Plan Note (Signed)
Better compared to previous visit

## 2012-12-14 NOTE — Assessment & Plan Note (Addendum)
Last visit with psychiatry about a month ago, symptoms reportedly well-controlled.  She is complaining of sleep walking, states sx resolved after her husband stopped a medication, name? Abilify? (removed from list)

## 2012-12-14 NOTE — Assessment & Plan Note (Addendum)
Last A1c 7.0, increased from previous levels. Unable to exercise much. Diet discussed. Recheck on return to the office.

## 2012-12-28 ENCOUNTER — Other Ambulatory Visit: Payer: Self-pay | Admitting: Internal Medicine

## 2012-12-28 NOTE — Telephone Encounter (Signed)
Refill done.  

## 2013-02-06 ENCOUNTER — Encounter (HOSPITAL_BASED_OUTPATIENT_CLINIC_OR_DEPARTMENT_OTHER): Payer: Self-pay | Admitting: Family Medicine

## 2013-02-06 ENCOUNTER — Telehealth: Payer: Self-pay | Admitting: Internal Medicine

## 2013-02-06 ENCOUNTER — Emergency Department (HOSPITAL_BASED_OUTPATIENT_CLINIC_OR_DEPARTMENT_OTHER)
Admission: EM | Admit: 2013-02-06 | Discharge: 2013-02-06 | Disposition: A | Discharge status: Home / Self Care | Payer: BC Managed Care – PPO | Attending: Emergency Medicine | Admitting: Emergency Medicine

## 2013-02-06 DIAGNOSIS — E785 Hyperlipidemia, unspecified: Secondary | ICD-10-CM | POA: Insufficient documentation

## 2013-02-06 DIAGNOSIS — Z8719 Personal history of other diseases of the digestive system: Secondary | ICD-10-CM | POA: Insufficient documentation

## 2013-02-06 DIAGNOSIS — I10 Essential (primary) hypertension: Secondary | ICD-10-CM | POA: Insufficient documentation

## 2013-02-06 DIAGNOSIS — Z880 Allergy status to penicillin: Secondary | ICD-10-CM | POA: Insufficient documentation

## 2013-02-06 DIAGNOSIS — E1169 Type 2 diabetes mellitus with other specified complication: Secondary | ICD-10-CM | POA: Insufficient documentation

## 2013-02-06 DIAGNOSIS — G43909 Migraine, unspecified, not intractable, without status migrainosus: Secondary | ICD-10-CM | POA: Insufficient documentation

## 2013-02-06 DIAGNOSIS — Z79899 Other long term (current) drug therapy: Secondary | ICD-10-CM | POA: Insufficient documentation

## 2013-02-06 DIAGNOSIS — R739 Hyperglycemia, unspecified: Secondary | ICD-10-CM

## 2013-02-06 DIAGNOSIS — F319 Bipolar disorder, unspecified: Secondary | ICD-10-CM | POA: Insufficient documentation

## 2013-02-06 DIAGNOSIS — F3289 Other specified depressive episodes: Secondary | ICD-10-CM | POA: Insufficient documentation

## 2013-02-06 DIAGNOSIS — Z8739 Personal history of other diseases of the musculoskeletal system and connective tissue: Secondary | ICD-10-CM | POA: Insufficient documentation

## 2013-02-06 DIAGNOSIS — F329 Major depressive disorder, single episode, unspecified: Secondary | ICD-10-CM | POA: Insufficient documentation

## 2013-02-06 LAB — GLUCOSE, CAPILLARY: Glucose-Capillary: 183 mg/dL — ABNORMAL HIGH (ref 70–99)

## 2013-02-06 NOTE — Telephone Encounter (Signed)
Patient Information:  Caller Name: [place]  Phone: [phone]  Patient: Adrienne Bennett, Adrienne Bennett  Gender: Female  DOB: 1960-12-26  Age: 52 Years  PCP: Willow Ora  Pregnant: No  Office Follow Up:  Does the office need to follow up with this patient?: Yes  Instructions For The Office: Blood glucose > 400. Disposition discuss with PCP and nurse call back within one hour   Symptoms  Reason For Call & Symptoms: Gricelda states her blood glucose is 404 @ 12:00. Is not on any medications for diabetes- states controlled with diet and exercise " but my numbers are not where they should be". States "diabetes specialist is on vacation until August." Two years ago had blood glucose > 400 was told by endocrinologist that she should have gone to office for an Insulin injection. Has not seen endocrinologist in past 3 years. No signs of ketoacidosis. Per diabetes high blood sugar protocol has discuss with PCP and callback by nurse within one hour. PLEASE CALL [place] AT [phone]  Reviewed Health History In EMR: Yes  Reviewed Medications In EMR: Yes  Reviewed Allergies In EMR: Yes  Reviewed Surgeries / Procedures: Yes  Date of Onset of Symptoms: 02/06/2013 OB / GYN:  LMP: Unknown  Guideline(s) Used:  Diabetes - High Blood Sugar  Disposition Per Guideline:   Discuss with PCP and Callback by Nurse within 1 Hour  Reason For Disposition Reached:   Blood glucose > 400 mg/dl (22 mmol/l)  Advice Given:  General  Symptoms of mild hyperglycemia: frequent urination, increased thirst, fatigue, blurred vision.  Symptoms of severe hyperglycemia: weakness, progressing to confusion and coma.  Treatment - Liquids  Drink at least one glass (8 oz or 240 ml) of water per hour for the next 4 hours. (Reason: adequate hydration will reduce hyperglycemia).  Generally, you should try to drink 6-8 glasses of water each day.  Measure and Record Your Blood Glucose  Record the results and show them to your doctor at  your next office visit.  Expected Course  Your blood sugar continues to get above 240 mg/dl (13 mmol/l).  Your blood sugar continues to be higher than your daily glucose goals (set by you and your doctor).  It has been longer than 6 months since you had an Hemoglobin A1C test.  Call Back If:  Blood glucose more than 300 mg/dL (29.5 mmol/l), 2 or more times in a row.  Vomiting lasting more than 4 hours or unable to drink any liquids.  Rapid breathing occurs  You become worse.  Patient Will Follow Care Advice:  YES

## 2013-02-06 NOTE — ED Provider Notes (Signed)
History     This chart was scribed for Adrienne Sprout, MD by Jiles Prows, ED Scribe. The patient was seen in room MH04/MH04 and the patient's care was started at 5:39 PM.  CSN: [id number] Arrival date & time 02/06/13  1634   Chief Complaint  Patient presents with  . Hyperglycemia   The history is provided by the patient and medical records. No language interpreter was used.   HPI Comments: Adrienne Bennett is a 52 y.o. female who presents to the Emergency Department complaining of sudden high blood sugar today.  She reports that she has not followed her blood sugar for 3 years.  Pt reports that her blood sugar got up to 404 today.  She states this reading was 20 minutes after eating.  She claims she eats every 2 hours.  She called her Dr who suggested she come into the ED.  Pt denies headache, diaphoresis, fever, chills, nausea, vomiting, diarrhea, weakness, cough, SOB and any other pain.  Pt reports that she is drinking almost a 2 liter of sprite a day.  Past Medical History  Diagnosis Date  . Depression     sess Dr.Plovsky  . 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 Diabetes mellitus 2007  . IBS (irritable bowel syndrome)     ? of IBS  . Migraine   . Hypertension   . SLE (systemic lupus erythematosus)     ??SLE -- as Dx by nephrology aprox 2007 , saw rheumatology who disagreed w/  Dx , was told she does not have Lupus --- (per pt )  . Other and unspecified hyperlipidemia 10/12/2012   Past Surgical History  Procedure Laterality Date  . Tubal ligation    . Tonsillectomy    . Carpal tunnel release     Family History  Problem Relation Age of Onset  . Diabetes Mother   . Diabetes Sister   . Diabetes Maternal Grandmother   . Coronary artery disease Father     age?  . Coronary artery disease Brother     age?  Marland Kitchen Hypertension Brother   . Hypertension Sister   .  Colon cancer Neg Hx   . Breast cancer Neg Hx    History  Substance Use Topics  . Smoking status: Never Smoker   . Smokeless tobacco: Never Used  . Alcohol Use: No   OB History   Grav Para Term Preterm Abortions TAB SAB Ect Mult Living                 Review of Systems A complete 10 system review of systems was obtained and all systems are negative except as noted in the HPI and PMH.   Allergies  Benztropine and Penicillins  Home Medications   Current Outpatient Rx  Name  Route  Sig  Dispense  Refill  . ALPRAZolam (XANAX) 1 MG tablet      Take 1 tab in the am 2 tabs in the pm and 1 tab once daily as needed.          . benztropine (COGENTIN) 1 MG tablet   Oral   Take 2 mg by mouth 2 (two) times daily.         Marland Kitchen buPROPion (WELLBUTRIN XL) 150 MG 24 hr tablet   Oral   Take 150 mg by mouth 3 (three) times daily. 4 tabs daily.         Marland Kitchen  clotrimazole-betamethasone (LOTRISONE) cream   Topical   Apply topically 2 (two) times daily.   60 g   0   . cyclobenzaprine (FLEXERIL) 10 MG tablet   Oral   Take 1 tablet (10 mg total) by mouth 2 (two) times daily as needed for muscle spasms.   60 tablet   0   . dicyclomine (BENTYL) 20 MG tablet   Oral   Take 20 mg by mouth 3 (three) times daily as needed.         . divalproex (DEPAKOTE) 500 MG 24 hr tablet      4 tabs daily.          Marland Kitchen lisinopril (PRINIVIL,ZESTRIL) 10 MG tablet      TAKE 1 TABLET BY MOUTH DAILY   30 tablet   6   . lisinopril (PRINIVIL,ZESTRIL) 10 MG tablet      TAKE 1 TABLET BY MOUTH ONCE DAILY   30 tablet   6   . meloxicam (MOBIC) 15 MG tablet   Oral   Take 15 mg by mouth daily as needed.         . propranolol (INDERAL) 10 MG tablet   Oral   Take 2 tablets (20 mg total) by mouth 2 (two) times daily.   120 tablet   11   . simvastatin (ZOCOR) 40 MG tablet                LMP 01/22/2013 Physical Exam  Nursing note and vitals reviewed. Constitutional: She is oriented to person,  place, and time. She appears well-developed and well-nourished. No distress.  Anxious appearing on exam.  HENT:  Head: Normocephalic and atraumatic.  Eyes: EOM are normal.  Neck: Neck supple. No tracheal deviation present.  Cardiovascular: Normal rate, regular rhythm, normal heart sounds and intact distal pulses.  Exam reveals no gallop and no friction rub.   No murmur heard. Pulmonary/Chest: Effort normal and breath sounds normal. No respiratory distress. She has no wheezes. She has no rales. She exhibits no tenderness.  Musculoskeletal: Normal range of motion.  Neurological: She is alert and oriented to person, place, and time.  Skin: Skin is warm and dry.  Psychiatric: She has a normal mood and affect. Her behavior is normal.    ED Course  Procedures (including critical care time) There were no vitals filed for this visit.  COORDINATION OF CARE: 5:41 PM - Discussed ED treatment with pt at bedside including follow up with PCP and pt agrees.   Blood sugar currently 180.  Advised pt to stay away from sweet tea, soda, and kool aid.  Pt advised to stay away from sweets and large amounts of bread.  Advised pt to switch to diet sodas or "zero."  Labs Reviewed  GLUCOSE, CAPILLARY - Abnormal; Notable for the following:    Glucose-Capillary 183 (*)    All other components within normal limits   No results found. 1. Hyperglycemia     MDM   Patient presented due to having a blood sugar of 404, 20 minutes after eating and spoke with her physician who requested she come to the emergency room. Patient states she's not currently on diabetic medications but this just started checking her blood sugar in the last one week. She states she drinks a 2 L bottle of regular soda a day and has deep every 2 hours because she's hungry. She otherwise has no other complaints. Repeat blood sugar here is 180. Patient's wife has no other complaints and  vital signs were stable.  Recommend that patient follow up  with her doctor for hemoglobin A1c and possible diabetic medications.  I personally performed the services described in this documentation, which was scribed in my presence.  The recorded information has been reviewed and considered.    Adrienne Sprout, MD 02/06/13 1750

## 2013-02-06 NOTE — Telephone Encounter (Signed)
Spoke with patient. Pt. States she is on her way to [facility] [place] to be seen.

## 2013-02-06 NOTE — Telephone Encounter (Signed)
Please advise. Encounter is accidently closed in epic.

## 2013-02-06 NOTE — Telephone Encounter (Signed)
A1c few months ago 7.0. She needs to be  seen, unfortunately I don't have any openings, refer to the [facility] [place] or the urgent care

## 2013-02-06 NOTE — ED Notes (Addendum)
Pt c/o blood sugar being "very high- 404" today and Dr. [name] told her to go to ED. Pt not currently on meds for diabetes. CBG in triage is 183. Pt denies symptoms. Pt sts cbg is "normally over 200 at home".

## 2013-02-08 ENCOUNTER — Encounter: Payer: Self-pay | Admitting: Internal Medicine

## 2013-02-08 ENCOUNTER — Ambulatory Visit (INDEPENDENT_AMBULATORY_CARE_PROVIDER_SITE_OTHER): Payer: BC Managed Care – PPO | Admitting: Internal Medicine

## 2013-02-08 VITALS — BP 125/92 | HR 78 | Temp 97.9°F | Wt 171.2 lb

## 2013-02-08 DIAGNOSIS — E119 Type 2 diabetes mellitus without complications: Secondary | ICD-10-CM

## 2013-02-08 LAB — ALT: ALT: 37 U/L — ABNORMAL HIGH (ref 0–35)

## 2013-02-08 LAB — AST: AST: 50 U/L — ABNORMAL HIGH (ref 0–37)

## 2013-02-08 MED ORDER — METFORMIN HCL 1000 MG PO TABS: 500.0000 mg | ORAL_TABLET | Freq: Two times a day (BID) | ORAL | Status: DC

## 2013-02-08 NOTE — Progress Notes (Signed)
  Subjective:    Patient ID: Adrienne Bennett, female    DOB: Jun 06, 1961, 52 y.o.   MRN: [id number]  HPI ER followup, note reviewed The patient decided to check her CBGs in the last few days (for no particular reason ), they were consistently in the 200s, a couple of days ago her CBG was 400. Went to the ER, CBG repeated was 180 she was recommended to followup here. Since then, she has been checking her blood sugars and they remained in the 200s although this morning was 152.  Past Medical History  Diagnosis Date  . Depression     sess Dr.Plovsky  . 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 Diabetes mellitus 2007  . IBS (irritable bowel syndrome)     ? of IBS  . Migraine   . Hypertension   . SLE (systemic lupus erythematosus)     ??SLE -- as Dx by nephrology aprox 2007 , saw rheumatology who disagreed w/  Dx , was told she does not have Lupus --- (per pt )  . Other and unspecified hyperlipidemia 10/12/2012   Past Surgical History  Procedure Laterality Date  . Tubal ligation    . Tonsillectomy    . 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 No lower extremity paresthesias.     Objective:   Physical Exam BP 125/92  Pulse 78  Temp(Src) 97.9 F (36.6 C) (Oral)  Wt 171 lb 3.2 oz (77.656 kg)  BMI 32.07 kg/m2  SpO2 96%  LMP 01/22/2013\ General -- alert, well-developed, NAD DIABETIC FEET EXAM: No lower extremity edema Normal pedal pulses bilaterally Skin and nails are normal without calluses Pinprick examination of the feet normal.  Neurologic-- alert & oriented X3 and strength normal in all extremities. Psych-- Cognition and judgment appear intact. Alert and cooperative with normal attention span and concentration.   not anxious appearing and not depressed appearing.      Assessment & Plan:

## 2013-02-08 NOTE — Patient Instructions (Addendum)
Start metformin  1000 mg half tablet daily twice a day Watch for side effects May need to increase the dose to one tablet twice a day Check your blood sugar once or twice a day. Always check in the morning before breakfast., the next  check needs to be 2 hours after a meals. Bring a log to the front desk in 3 weeks Please come back in 10 weeks

## 2013-02-08 NOTE — Assessment & Plan Note (Addendum)
Long history of diabetes, on diet control, recently seen at the ER with CBG of 400. Ambulatory CBGs are consistently in the 200s, this morning was 152. Reports last eye exam was within a year Feet exam today negative. Plan: Educated about diet, exercise Nutritionist referral New glucometer provided Check A1c LFTs CBG goals discussed Start metformin 1000 mg half twice a day, depending on the A1c may need to rec 1 bid Return to clinic in 2 months  Today , I spent more than 25 min with the patient, >50% of the time counseling

## 2013-02-09 ENCOUNTER — Telehealth: Payer: Self-pay | Admitting: *Deleted

## 2013-02-09 NOTE — Telephone Encounter (Signed)
VM left from the pharmacy stating that the metformin 1000 mg tablet are too big for Pt. Pharmacy request that med be change to smaller tab. Per pharmacy even if tab 1/2 or 1/4 it is still too big. Please advise

## 2013-02-09 NOTE — Telephone Encounter (Signed)
Dr [name] can address size of pills 7/18

## 2013-02-10 MED ORDER — METFORMIN HCL 500 MG PO TABS: 500.0000 mg | ORAL_TABLET | Freq: Two times a day (BID) | ORAL | Status: DC

## 2013-02-10 NOTE — Telephone Encounter (Signed)
Change to metformin for 500 mg one by mouth twice a day. Call pharmacy, They need to let the patient see the  pill before dispense, she may need to break it in half

## 2013-02-10 NOTE — Telephone Encounter (Signed)
Rx sent, discuss with pharmacy.

## 2013-02-13 ENCOUNTER — Other Ambulatory Visit: Payer: Self-pay

## 2013-02-13 ENCOUNTER — Telehealth: Payer: Self-pay | Admitting: Internal Medicine

## 2013-02-13 MED ORDER — GLUCOSE BLOOD VI STRP: ORAL_STRIP | Status: DC

## 2013-02-13 MED ORDER — ONETOUCH ULTRASOFT LANCETS MISC: Status: DC

## 2013-02-13 MED ORDER — SITAGLIPTIN PHOSPHATE 100 MG PO TABS: 100.0000 mg | ORAL_TABLET | Freq: Every day | ORAL | Status: DC

## 2013-02-13 NOTE — Progress Notes (Signed)
Spoke with patient and advised of labs. Patient will come by to pick up samples of [place]. Placed at front desk. GF/RN

## 2013-02-13 NOTE — Telephone Encounter (Signed)
Patient is calling asking that we send a refill for her Lancets and testing strips to her pharmacy. She is completely out and cannot test her blood sugar. Patient uses [facility] in [place].

## 2013-02-15 ENCOUNTER — Telehealth: Payer: Self-pay | Admitting: Internal Medicine

## 2013-02-15 DIAGNOSIS — E119 Type 2 diabetes mellitus without complications: Secondary | ICD-10-CM

## 2013-02-15 MED ORDER — ONETOUCH ULTRASOFT LANCETS MISC: Status: DC

## 2013-02-15 MED ORDER — GLUCOSE BLOOD VI STRP: ORAL_STRIP | Status: DC

## 2013-02-15 NOTE — Telephone Encounter (Signed)
Patient called asking if she needs to take the [place]. She states she is getting great readings with just the metformin.

## 2013-02-15 NOTE — Telephone Encounter (Signed)
Refill for test strips and lancets sent to [facility] in [place]

## 2013-02-16 ENCOUNTER — Telehealth: Payer: Self-pay | Admitting: Internal Medicine

## 2013-02-16 NOTE — Telephone Encounter (Signed)
Patient is calling in regards to her Januvia medication. She states that the medication says not to take if you have kidney problems and she states that she had kidney problems in the past and just stopped going to her doctor for it so she thinks she may still have issues.  Please advise.

## 2013-02-17 ENCOUNTER — Telehealth: Payer: Self-pay | Admitting: Internal Medicine

## 2013-02-17 ENCOUNTER — Other Ambulatory Visit: Payer: Self-pay

## 2013-02-17 DIAGNOSIS — E119 Type 2 diabetes mellitus without complications: Secondary | ICD-10-CM

## 2013-02-17 MED ORDER — ONETOUCH ULTRASOFT LANCETS MISC: Status: DC

## 2013-02-17 MED ORDER — GLUCOSE BLOOD VI STRP: ORAL_STRIP | Status: DC

## 2013-02-17 NOTE — Telephone Encounter (Signed)
Discussed with patient, scheduled follow up visit to discuss further with MD

## 2013-02-17 NOTE — Telephone Encounter (Signed)
Advise patient: Her diabetes is not well-controlled, last A1c 10. Currently, her kidney function is normal, I don't see a problem with Januvia, we do need to be careful and monitor her kidney and liver function closely. Arrange office visit to discuss further if necessary

## 2013-02-17 NOTE — Telephone Encounter (Deleted)
Dr. [name]:  Would you recommend

## 2013-02-17 NOTE — Telephone Encounter (Signed)
Patient Information:  Caller Name: Adrienne Bennett  Phone: [phone]  Patient: Adrienne Bennett  Gender: Female  DOB: [birth year]  Age: 52 Years  PCP: Adrienne Bennett  Pregnant: No  Office Follow Up:  Does the office need to follow up with this patient?: Yes  Instructions For The Office: PLEASE CALL PT REGARDING THE ABOVE QUESTIONS.  RN Note:  PLEASE CALL PT REGARDING THE ABOVE QUESTIONS.  Symptoms  Reason For Call & Symptoms: Pt calling regarding questions about [place]. Pt states she read that she shouldn't take it with a hx of kidney disease and she has a hx of proteinuria and kidney issues. Pt also asking if Dr. Drue Bennett really wants her on [place] b/c her blood sugars are well controlled with only Metformin.  Reviewed Health History In EMR: N/A  Reviewed Medications In EMR: N/A  Reviewed Allergies In EMR: N/A  Reviewed Surgeries / Procedures: N/A  Date of Onset of Symptoms: 02/17/2013 OB / GYN:  LMP: Unknown  Guideline(s) Used:  No Protocol Available - Information Only  Disposition Per Guideline:   Discuss with PCP and Callback by Nurse Today  Reason For Disposition Reached:   Nursing judgment  Advice Given:  N/A  Patient Will Follow Care Advice:  YES

## 2013-03-08 ENCOUNTER — Ambulatory Visit (INDEPENDENT_AMBULATORY_CARE_PROVIDER_SITE_OTHER): Payer: 59 | Admitting: Internal Medicine

## 2013-03-08 ENCOUNTER — Encounter: Payer: Self-pay | Admitting: Internal Medicine

## 2013-03-08 VITALS — BP 155/100 | HR 81 | Temp 98.0°F | Wt 171.4 lb

## 2013-03-08 DIAGNOSIS — E119 Type 2 diabetes mellitus without complications: Secondary | ICD-10-CM

## 2013-03-08 DIAGNOSIS — M549 Dorsalgia, unspecified: Secondary | ICD-10-CM

## 2013-03-08 DIAGNOSIS — I10 Essential (primary) hypertension: Secondary | ICD-10-CM

## 2013-03-08 LAB — BASIC METABOLIC PANEL
CO2: 29 mEq/L (ref 19–32)
Chloride: 103 mEq/L (ref 96–112)
Glucose, Bld: 106 mg/dL — ABNORMAL HIGH (ref 70–99)
Potassium: 4.1 mEq/L (ref 3.5–5.1)
Sodium: 137 mEq/L (ref 135–145)

## 2013-03-08 MED ORDER — CYCLOBENZAPRINE HCL 10 MG PO TABS: 10.0000 mg | ORAL_TABLET | Freq: Two times a day (BID) | ORAL | Status: DC | PRN

## 2013-03-08 NOTE — Patient Instructions (Addendum)
Continue checking your blood sugars Check the  blood pressure 2 or 3 times a week, be sure it is between 110/60 and 140/85. If it is consistently higher or lower, let me know --- Please get your x-ray at the other [place]  office located at: [address] [place], across from [facility].  Please go to the basement, this is a walk-in facility, they are open from 8:30 to 5:30 PM. Phone number [phone]. --- Avoid advil, ibuprofen, Motrin, naproxen. They may increase your  blood pressure Tylenol  500 mg OTC 2 tabs a day every 8 hours as needed for pain Flexeril at bed time , will cause drowsiness --- Next visit 2 months

## 2013-03-08 NOTE — Assessment & Plan Note (Addendum)
Last A1c 10.0, started metformin 500 mg twice a day (Unable to swallow 1000 mg tablets )and Januvia. Good compliance and tolerance. See ambulatory CBGs at the history of present illness. Patient very emotional about a worsening of her diabetes, she is counseled. Also talked about diet and exercise, she is unable to exercise and is unable to eat salads. Plan: Check LFTs If LFTs okay increase metformin to 750 mg twice a day Nutritionist visit pending

## 2013-03-08 NOTE — Assessment & Plan Note (Addendum)
Complains again about pain, she thinks is her hip but pain seems back related  Plan: X-ray Avoid Advil, BP elevated Tylenol, Flexeril If not better, consider further eval

## 2013-03-08 NOTE — Assessment & Plan Note (Addendum)
BP slightly elevated today, no ambulatory BPs. Plan: Discontinue Advil, Tylenol for pain BMP ambulatory BPs

## 2013-03-08 NOTE — Progress Notes (Signed)
  Subjective:    Patient ID: Adrienne Bennett, female    DOB: 1961/02/22, 52 y.o.   MRN: [id number]  HPI Followup Diabetes, was unable to take metformin 1000 mg tablet d/t pill size, tolerating 500 mg tablets well. Blood sugars fasting are on average between 130 and 150. Afternoons CBGs 140. Additionally, she reports a "hip pain" she points to the right lower back. Pain does not radiate. Denies lower extremity paresthesias. Taking advil. BP noted to be elevated today. Good compliance with medications, not ambulatory BPs.  Past Medical History  Diagnosis Date  . Depression     sess Dr.Plovsky  . 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 Diabetes mellitus 2007  . IBS (irritable bowel syndrome)     ? of IBS  . Migraine   . Hypertension   . SLE (systemic lupus erythematosus)     ??SLE -- as Dx by nephrology aprox 2007 , saw rheumatology who disagreed w/  Dx , was told she does not have Lupus --- (per pt )  . Other and unspecified hyperlipidemia 10/12/2012   Past Surgical History  Procedure Laterality Date  . Tubal ligation    . Tonsillectomy    . Carpal tunnel release       Review of Systems Denies nausea, vomiting, diarrhea. Now symptoms consistent with low sugar symptoms. Diet--not improved, nutritionist eval pending Exercise -- in general she is unable to exercise.     Objective:   Physical Exam BP 155/100  Pulse 81  Temp(Src) 98 F (36.7 C) (Oral)  Wt 171 lb 6.4 oz (77.747 kg)  BMI 32.11 kg/m2  SpO2 97%  LMP 01/22/2013  General -- alert, well-developed, .   Back-- no Tender to palpation Extremities--   hip rotation normal; Not trochanteric bursa tenderness DIABETIC FEET EXAM: No lower extremity edema Normal pedal pulses bilaterally Skin and nails are normal without calluses Pinprick examination of the feet normal. Neurologic-- alert & oriented X3 and  strength normal in all extremities. Psych-- Cognition and judgment appear intact. Alert and cooperative with normal attention span and concentration.  Emotional about DM (counseled)     Assessment & Plan:

## 2013-03-14 ENCOUNTER — Encounter: Payer: Self-pay | Admitting: *Deleted

## 2013-03-17 ENCOUNTER — Ambulatory Visit (HOSPITAL_BASED_OUTPATIENT_CLINIC_OR_DEPARTMENT_OTHER)
Admission: RE | Admit: 2013-03-17 | Discharge: 2013-03-17 | Disposition: A | Discharge status: Home / Self Care | Payer: Medicare Other | Source: Ambulatory Visit | Attending: Internal Medicine | Admitting: Internal Medicine

## 2013-03-17 DIAGNOSIS — M545 Low back pain, unspecified: Secondary | ICD-10-CM | POA: Insufficient documentation

## 2013-03-17 DIAGNOSIS — M51379 Other intervertebral disc degeneration, lumbosacral region without mention of lumbar back pain or lower extremity pain: Secondary | ICD-10-CM | POA: Insufficient documentation

## 2013-03-17 DIAGNOSIS — M549 Dorsalgia, unspecified: Secondary | ICD-10-CM

## 2013-03-17 DIAGNOSIS — M5137 Other intervertebral disc degeneration, lumbosacral region: Secondary | ICD-10-CM | POA: Insufficient documentation

## 2013-03-20 ENCOUNTER — Telehealth: Payer: Self-pay | Admitting: Internal Medicine

## 2013-03-20 NOTE — Telephone Encounter (Signed)
Pt wanted to see if dr [name] could call her in a rx for Januvia. Pharmacy [facility] [place] thanks

## 2013-03-20 NOTE — Telephone Encounter (Signed)
Called pt per our records pt should have a refill left for Januvia at [facility] in [place]. This med was filled #30 with 2 refills on 02/13/2013. LMOVM for pt to return call.

## 2013-03-21 ENCOUNTER — Encounter: Payer: 59 | Attending: Internal Medicine

## 2013-03-21 VITALS — Ht 61.0 in | Wt 173.7 lb

## 2013-03-21 DIAGNOSIS — Z713 Dietary counseling and surveillance: Secondary | ICD-10-CM | POA: Insufficient documentation

## 2013-03-21 DIAGNOSIS — E119 Type 2 diabetes mellitus without complications: Secondary | ICD-10-CM | POA: Insufficient documentation

## 2013-03-25 NOTE — Progress Notes (Signed)
Patient was seen on 03/21/13 for the first of a series of three diabetes self-management courses at the [facility].   Current HbA1c: 10.1 %  The following learning objectives were met by the patient during this course:   Defines the role of glucose and insulin  Identifies type of diabetes and pathophysiology  Defines the diagnostic criteria for diabetes and prediabetes  States the risk factors for Type 2 Diabetes  States the symptoms of Type 2 Diabetes  Defines Type 2 Diabetes treatment goals  Defines Type 2 Diabetes treatment options  States the rationale for glucose monitoring  Identifies A1C, glucose targets, and testing times  Identifies proper sharps disposal  Defines the purpose of a diabetes food plan  Identifies carbohydrate food groups  Defines effects of carbohydrate foods on glucose levels  Identifies carbohydrate choices/grams/food labels  States benefits of physical activity and effect on glucose  Review of suggested activity guidelines  Handouts given during class include:  Type 2 Diabetes: Basics Book  My Food Plan Book  Food and Activity Log  Your patient has identified their diabetes self-care support plan as:  [facility] support group  Continued diabetes education  Follow-Up Plan: Attend core 2 and core 3

## 2013-03-25 NOTE — Patient Instructions (Signed)
Goals:  Follow Diabetes Meal Plan as instructed  Eat 3 meals and 2 snacks, every 3-5 hrs  Limit carbohydrate intake to 30-45 grams carbohydrate/meal  Limit carbohydrate intake to 0-15 grams carbohydrate/snack  Add lean protein foods to meals/snacks  Monitor glucose levels as instructed by your doctor  Aim for 15-30 mins of physical activity daily  Bring food record and glucose log to your next nutrition visit   

## 2013-03-31 ENCOUNTER — Telehealth: Payer: Self-pay | Admitting: Internal Medicine

## 2013-03-31 NOTE — Telephone Encounter (Signed)
Patient is calling to request results of her most recent x-ray. Informed her that Dr. [name] had released this to MyChart but she says she does not use MyChart anymore at this time. Please advise

## 2013-03-31 NOTE — Telephone Encounter (Signed)
Attempted to contact pt regarding recent neg x-ray. Left message on voice mail to return our call.

## 2013-03-31 NOTE — Telephone Encounter (Signed)
See below, please discuss with the patient ----> X-rays show are tried his eyes expected, if the pain continue she will need referral. Also, please ask patient to deactivated mychart to avoid further confusion.

## 2013-04-03 ENCOUNTER — Telehealth: Payer: Self-pay | Admitting: *Deleted

## 2013-04-03 NOTE — Telephone Encounter (Signed)
Patient returned call, I advised of recent X-Ray results. Encouraged pt to try heating pad on lowest setting wrapped in towel, on for 5-7 minutes then off for 30 minutes. Encouraged pt to do this as much as she desired to see if it would help with the pain.

## 2013-04-04 ENCOUNTER — Telehealth: Payer: Self-pay | Admitting: Internal Medicine

## 2013-04-04 DIAGNOSIS — M549 Dorsalgia, unspecified: Secondary | ICD-10-CM

## 2013-04-04 NOTE — Telephone Encounter (Signed)
Handled by Dois Davenport.

## 2013-04-04 NOTE — Telephone Encounter (Signed)
Arrange a orthopedic referral , DX back pain

## 2013-04-04 NOTE — Telephone Encounter (Signed)
Orthopedic referral ordered. DJR

## 2013-04-04 NOTE — Telephone Encounter (Signed)
Patient called because she has arthritis and none of the meds that she is currently on is not working. Please give patient a call back. thanks

## 2013-04-07 ENCOUNTER — Telehealth: Payer: Self-pay | Admitting: Internal Medicine

## 2013-04-07 DIAGNOSIS — E119 Type 2 diabetes mellitus without complications: Secondary | ICD-10-CM

## 2013-04-07 NOTE — Telephone Encounter (Signed)
Patient called about medication refill on her test strips but thinks that we need to up the amount of of test strips. Please call patient thanks.

## 2013-04-10 NOTE — Telephone Encounter (Signed)
[facility] Her insurance will not pay unless the directions specify how to use. "Use as directed" is not acceptable. Patient says last time she had to pay out of pocket for her test strips because we wrote it this way. Please advise.

## 2013-04-11 ENCOUNTER — Other Ambulatory Visit: Payer: Self-pay | Admitting: *Deleted

## 2013-04-11 MED ORDER — GLUCOSE BLOOD VI STRP: ORAL_STRIP | Status: DC

## 2013-04-11 NOTE — Telephone Encounter (Signed)
Spoke with pt to clarify which pharmacy she wants script for test strips faxed to. [facility] [place]

## 2013-04-11 NOTE — Telephone Encounter (Signed)
rx resent with proper directions on how to take test strips by Dr. [name] request. Pt notified via tele. DJR

## 2013-05-09 ENCOUNTER — Encounter: Payer: 59 | Attending: Internal Medicine

## 2013-05-09 DIAGNOSIS — E119 Type 2 diabetes mellitus without complications: Secondary | ICD-10-CM | POA: Insufficient documentation

## 2013-05-09 DIAGNOSIS — Z713 Dietary counseling and surveillance: Secondary | ICD-10-CM | POA: Insufficient documentation

## 2013-05-16 DIAGNOSIS — E119 Type 2 diabetes mellitus without complications: Secondary | ICD-10-CM | POA: Diagnosis not present

## 2013-05-16 DIAGNOSIS — Z713 Dietary counseling and surveillance: Secondary | ICD-10-CM | POA: Diagnosis present

## 2013-05-22 NOTE — Progress Notes (Signed)
Patient was seen on 05/16/13 for the third of a series of three diabetes self-management courses at the [facility]. The following learning objectives were met by the patient during this class:    State the amount of activity recommended for healthy living   Describe activities suitable for individual needs   Identify ways to regularly incorporate activity into daily life   Identify barriers to activity and ways to over come these barriers  Identify diabetes medications being personally used and their primary action for lowering glucose and possible side effects   Describe role of stress on blood glucose and develop strategies to address psychosocial issues   Identify diabetes complications and ways to prevent them  Explain how to manage diabetes during illness   Evaluate success in meeting personal goal   Establish 2-3 goals that they will plan to diligently work on until they return for the free 37-month follow-up visit  Your patient has established the following 4 month goals in their individualized success plan:  Count my carbohydrates at most meals and snacks  Increase my activity level at least 3 days a week for 10 minutes or more each time  Test my blood sugar 2 times a day 7 days a week and look for patterns at least 3 days a month  To help manage my stress I will listen to music at least 3 times a week  Your patient has identified these potential barriers to change:  My husband's eating habits  Your patient has identified their diabetes self-care support plan as  Check BG  exercise  De-stress

## 2013-06-01 ENCOUNTER — Other Ambulatory Visit: Payer: Self-pay

## 2013-06-05 ENCOUNTER — Telehealth: Payer: Self-pay | Admitting: Internal Medicine

## 2013-06-05 ENCOUNTER — Other Ambulatory Visit: Payer: Self-pay | Admitting: *Deleted

## 2013-06-05 DIAGNOSIS — E119 Type 2 diabetes mellitus without complications: Secondary | ICD-10-CM

## 2013-06-05 MED ORDER — GLUCOSE BLOOD VI STRP: ORAL_STRIP | Status: DC

## 2013-06-05 MED ORDER — ONETOUCH ULTRASOFT LANCETS MISC: Status: DC

## 2013-06-05 NOTE — Telephone Encounter (Signed)
Done. DJR  

## 2013-06-05 NOTE — Telephone Encounter (Signed)
Patient does not want to use her current pharmacy anymore and is asking for her test strips [facility]ets [facility] [place]. She states that she was told by her pharmacy that since she has Medicare a new rx needed to be sent and the old one could not be transferred from another pharmacy.

## 2013-06-05 NOTE — Telephone Encounter (Signed)
[facility]rips [facility]ets [facility] [place].

## 2013-06-07 ENCOUNTER — Ambulatory Visit (INDEPENDENT_AMBULATORY_CARE_PROVIDER_SITE_OTHER): Payer: 59 | Admitting: Internal Medicine

## 2013-06-07 ENCOUNTER — Encounter: Payer: Self-pay | Admitting: Internal Medicine

## 2013-06-07 VITALS — BP 192/84 | HR 88 | Temp 98.9°F | Wt 180.2 lb

## 2013-06-07 DIAGNOSIS — M549 Dorsalgia, unspecified: Secondary | ICD-10-CM

## 2013-06-07 DIAGNOSIS — Z23 Encounter for immunization: Secondary | ICD-10-CM

## 2013-06-07 DIAGNOSIS — E119 Type 2 diabetes mellitus without complications: Secondary | ICD-10-CM

## 2013-06-07 DIAGNOSIS — I10 Essential (primary) hypertension: Secondary | ICD-10-CM

## 2013-06-07 LAB — HEMOGLOBIN A1C: Hgb A1c MFr Bld: 8.1 % — ABNORMAL HIGH (ref 4.6–6.5)

## 2013-06-07 MED ORDER — LISINOPRIL 10 MG PO TABS: 20.0000 mg | ORAL_TABLET | Freq: Every day | ORAL | Status: DC

## 2013-06-07 NOTE — Progress Notes (Signed)
Pre visit review using our clinic review tool, if applicable. No additional management support is needed unless otherwise documented below in the visit note. 

## 2013-06-07 NOTE — Assessment & Plan Note (Addendum)
Feet exam today negative, eye check ~8 months ago per patient (negative) Treatment limited by a # of factors:  inability to exercise, diet ( can't eat salad, must have OJ to swallow meds, can't take large pills) She was supposed to take metformin 500 mg 1.5 tablets twice a day but is only taking one tablet twice a day. No recent ambulatory CBGs, except this morning was around 270 Plan:  Increase metformin to 2 tabs  twice a day Continue Januvia A1c Likely will need to more aggressive treatment, consider endocrinology referral.

## 2013-06-07 NOTE — Assessment & Plan Note (Signed)
Much improved after she saw orthopedic surgery and was prescribed Flexeril

## 2013-06-07 NOTE — Assessment & Plan Note (Addendum)
Last BMP normal, well-controlled? Increase lisinopril to 20 mg, encourage ambulatory BPs

## 2013-06-07 NOTE — Patient Instructions (Signed)
Get your blood work before you leave  Next visit in 2 months  for a  diabetes hypertension depression follow up (30 minutes) No Fasting Please make an appointment      Check the  blood pressure 2 or 3 times a week be sure it is between 110/60 and 140/85. Ideal blood pressure is 120/80. If it is consistently higher or lower, let me know  Call if your blood sugars are more than 180

## 2013-06-07 NOTE — Progress Notes (Signed)
  Subjective:    Patient ID: Adrienne Bennett, female    DOB: 07-25-1961, 52 y.o.   MRN: [id number]  HPI Followup from previous visit Diabetes--saw the dietitian x 3, still feels she is unable to understand counting carbohydrates, taking  less metformin than what she was supposed to, see assessment and plan Hypertension--no ambulatory BP, BP today elevated. Back pain-- went to see orthopedic surgery, Rx flexeril, doing great  Past Medical History  Diagnosis Date  . Depression     sess Dr.Plovsky  . 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 Diabetes mellitus 2007  . IBS (irritable bowel syndrome)     ? of IBS  . Migraine   . Hypertension   . SLE (systemic lupus erythematosus)     ??SLE -- as Dx by nephrology aprox 2007 , saw rheumatology who disagreed w/  Dx , was told she does not have Lupus --- (per pt )  . Other and unspecified hyperlipidemia 10/12/2012   Past Surgical History  Procedure Laterality Date  . Tubal ligation    . Tonsillectomy    . Carpal tunnel release      Review of Systems Last eye check a months ago, no retinopathy Due for a flu shot Denies chest pain, SOB. Denies nausea, vomiting, diarrhea. Last week   had some tingling "like needles" at the right foot, symptoms lasted a few hours and resolved     Objective:   Physical Exam BP 192/84  Pulse 88  Temp(Src) 98.9 F (37.2 C)  Wt 180 lb 3.2 oz (81.738 kg)  SpO2 95% General -- alert, well-developed, NAD.  Lungs -- normal respiratory effort, no intercostal retractions, no accessory muscle use, and normal breath sounds.  Heart-- normal rate, regular rhythm, no murmur.  DIABETIC FEET EXAM: No lower extremity edema Normal pedal pulses bilaterally Skin normal, nails normal, no calluses Pinprick examination of the feet normal. Neurologic--  alert & oriented X3.   Psych-- Cognition and judgment  appear intact. Cooperative with normal attention span and concentration. No anxious appearing , no depressed appearing.      Assessment & Plan:

## 2013-06-12 ENCOUNTER — Other Ambulatory Visit: Payer: Self-pay | Admitting: *Deleted

## 2013-06-12 MED ORDER — METFORMIN HCL 500 MG PO TABS: 1000.0000 mg | ORAL_TABLET | Freq: Two times a day (BID) | ORAL | Status: DC

## 2013-06-12 NOTE — Telephone Encounter (Signed)
Metformin refilled to reflect change in order

## 2013-07-10 ENCOUNTER — Telehealth: Payer: Self-pay | Admitting: Internal Medicine

## 2013-07-10 MED ORDER — MELOXICAM 15 MG PO TABS: 15.0000 mg | ORAL_TABLET | Freq: Every day | ORAL | Status: DC | PRN

## 2013-07-10 MED ORDER — SITAGLIPTIN PHOSPHATE 100 MG PO TABS: 100.0000 mg | ORAL_TABLET | Freq: Every day | ORAL | Status: DC

## 2013-07-10 NOTE — Telephone Encounter (Signed)
Done. rx refilled per protocol. DJR

## 2013-07-10 NOTE — Addendum Note (Signed)
Addended by: Eustace Quail on: 07/10/2013 03:18 PM   Modules accepted: Orders

## 2013-07-10 NOTE — Telephone Encounter (Signed)
Patient states she needs new rx for januvia and meloxicam. She states they have been trying to contact us. [facility] [place]

## 2013-07-28 ENCOUNTER — Other Ambulatory Visit: Payer: Self-pay | Admitting: *Deleted

## 2013-07-28 MED ORDER — SIMVASTATIN 40 MG PO TABS: 40.0000 mg | ORAL_TABLET | Freq: Every day | ORAL | Status: DC

## 2013-07-31 ENCOUNTER — Telehealth: Payer: Self-pay | Admitting: Internal Medicine

## 2013-07-31 MED ORDER — LISINOPRIL 10 MG PO TABS: 20.0000 mg | ORAL_TABLET | Freq: Every day | ORAL | Status: DC

## 2013-07-31 NOTE — Telephone Encounter (Signed)
Done. DJR  

## 2013-07-31 NOTE — Telephone Encounter (Signed)
Patient states that her new insurance requires a 90-day supply on her Lisinopril rx. [facility] would [facility] [place]. Please advise.

## 2013-08-07 ENCOUNTER — Telehealth: Payer: Self-pay

## 2013-08-07 NOTE — Telephone Encounter (Addendum)
Left message for call back Non identifiable Medication and allergies:  Reviewed and updated  90 day supply/mail order: na Local pharmacy: [facility] [place]   Immunizations due:  UTD  A/P:   No changes to FH or PSH or personal hx CCS--07/2010--Dr Edwards--polyps--recommendation 3 years per patient--pt aware [facility]--appt 08/17/2013 Pap--referred to Dr Ledell PeoplesMcComb--appt 08/17/2013 Flu--05/2013 PNA--2010 Tdap--06/2012  To Discuss with Provider: Not at this time

## 2013-08-08 ENCOUNTER — Ambulatory Visit (INDEPENDENT_AMBULATORY_CARE_PROVIDER_SITE_OTHER): Payer: 59 | Admitting: Internal Medicine

## 2013-08-08 ENCOUNTER — Encounter: Payer: Self-pay | Admitting: Internal Medicine

## 2013-08-08 VITALS — BP 167/84 | HR 82 | Temp 98.2°F | Ht 61.4 in | Wt 182.6 lb

## 2013-08-08 DIAGNOSIS — M549 Dorsalgia, unspecified: Secondary | ICD-10-CM

## 2013-08-08 DIAGNOSIS — I10 Essential (primary) hypertension: Secondary | ICD-10-CM

## 2013-08-08 DIAGNOSIS — E119 Type 2 diabetes mellitus without complications: Secondary | ICD-10-CM

## 2013-08-08 DIAGNOSIS — Z0000 Encounter for general adult medical examination without abnormal findings: Secondary | ICD-10-CM

## 2013-08-08 LAB — COMPREHENSIVE METABOLIC PANEL
ALBUMIN: 3.3 g/dL — AB (ref 3.5–5.2)
ALT: 25 U/L (ref 0–35)
AST: 42 U/L — AB (ref 0–37)
Alkaline Phosphatase: 55 U/L (ref 39–117)
BUN: 9 mg/dL (ref 6–23)
CHLORIDE: 98 meq/L (ref 96–112)
CO2: 29 meq/L (ref 19–32)
CREATININE: 0.5 mg/dL (ref 0.4–1.2)
Calcium: 9.1 mg/dL (ref 8.4–10.5)
GFR: 150.8 mL/min (ref >60.00)
GLUCOSE: 115 mg/dL — AB (ref 70–99)
POTASSIUM: 4 meq/L (ref 3.5–5.1)
Sodium: 137 mEq/L (ref 135–145)
Total Bilirubin: 0.4 mg/dL (ref 0.3–1.2)
Total Protein: 6.9 g/dL (ref 6.0–8.3)

## 2013-08-08 LAB — HEMOGLOBIN A1C: HEMOGLOBIN A1C: 7.7 % — AB (ref 4.6–6.5)

## 2013-08-08 LAB — LIPID PANEL
CHOLESTEROL: 118 mg/dL (ref 0–200)
HDL: 43.2 mg/dL (ref >39.00)
LDL Cholesterol: 53 mg/dL (ref 0–99)
TRIGLYCERIDES: 110 mg/dL (ref 0.0–149.0)
Total CHOL/HDL Ratio: 3
VLDL: 22 mg/dL (ref 0.0–40.0)

## 2013-08-08 MED ORDER — LISINOPRIL 40 MG PO TABS: 40.0000 mg | ORAL_TABLET | Freq: Every day | ORAL | Status: DC

## 2013-08-08 MED ORDER — MELOXICAM 7.5 MG PO TABS: 7.5000 mg | ORAL_TABLET | Freq: Every day | ORAL | Status: DC
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
## 2013-08-08 NOTE — Patient Instructions (Signed)
Get your blood work before you leave   Next visit is for routine check up regards   Hypertension  in 1 month, no  fasting Please make an appointment

## 2013-08-08 NOTE — Assessment & Plan Note (Signed)
Excellent response to meloxicam however BP is elevated, will decrease meloxicam from 15 mg to 7.5 mg

## 2013-08-08 NOTE — Assessment & Plan Note (Addendum)
Not well-controlled for the last few weeks, Related to meloxicam?  She is taking meloxicam daily with excellent control of her back pain. Plan:  Check a CMP Increase his lisinopril from 20 mg to 40 mg Decrease meloxicam from 15 mg to 7.5 mg Office visit in one month

## 2013-08-08 NOTE — Progress Notes (Signed)
Pre visit review using our clinic review tool, if applicable. No additional management support is needed unless otherwise documented below in the visit note. 

## 2013-08-08 NOTE — Assessment & Plan Note (Addendum)
Last A1c was improving, we will recheck her A1c. Definitely needs to improve her lifestyle----> encouraged To make some changes

## 2013-08-08 NOTE — Assessment & Plan Note (Addendum)
Tdap--2013 Had a flu shot Pneumonia shot 2010 zostavax discussed  Colonoscopy 07-2010, Dr. Randa EvensEdwards, had polyps, repeat in 3 years; will wait for GI recall  To see Dr. Arelia SneddonMcComb this month To have a mammogram @ gyn Diet exercise discussed

## 2013-08-09 ENCOUNTER — Telehealth: Payer: Self-pay

## 2013-08-09 NOTE — Telephone Encounter (Signed)
Relevant patient education assigned to patient using Emmi. ° °

## 2013-08-14 ENCOUNTER — Telehealth: Payer: Self-pay | Admitting: *Deleted

## 2013-08-14 NOTE — Telephone Encounter (Signed)
Message copied by Eustace QuailEABOLD, Harbert Fitterer J on Mon Aug 14, 2013  9:47 AM ------      Message from: Willow OraPAZ, JOSE E      Created: Sun Aug 13, 2013 10:32 AM       Advise patient:      Cholesterol is well-controlled, diabetes improved but not yet at goal .      Recommend to increase metformin 500 mg from one tablet twice a day ---> 1.5 tablets twice a day ------

## 2013-08-14 NOTE — Telephone Encounter (Signed)
I prefer she tries Tylenol  500 mg OTC 2 tabs a day every 8 hours as needed for pain. If that is not helping to call us in 2-3 weeks

## 2013-08-14 NOTE — Telephone Encounter (Signed)
Pt notified of lab results. Pt states that she is having painful back spasms and was told to reduce meloxcam to half tab to see if it would reduce B/P,  wants to know if she could go back to original dose due to back issues.

## 2013-08-17 NOTE — Telephone Encounter (Signed)
Pt.notified

## 2013-08-30 ENCOUNTER — Telehealth: Payer: Self-pay | Admitting: Internal Medicine

## 2013-08-30 NOTE — Telephone Encounter (Signed)
Relevant patient education assigned to patient using Emmi. ° °

## 2013-09-01 ENCOUNTER — Encounter (HOSPITAL_BASED_OUTPATIENT_CLINIC_OR_DEPARTMENT_OTHER): Payer: Self-pay | Admitting: Emergency Medicine

## 2013-09-01 ENCOUNTER — Observation Stay (HOSPITAL_BASED_OUTPATIENT_CLINIC_OR_DEPARTMENT_OTHER)
Admission: EM | Admit: 2013-09-01 | Discharge: 2013-09-02 | Disposition: A | Discharge status: Home / Self Care | Payer: 59 | Attending: Internal Medicine | Admitting: Internal Medicine

## 2013-09-01 ENCOUNTER — Emergency Department (HOSPITAL_BASED_OUTPATIENT_CLINIC_OR_DEPARTMENT_OTHER): Payer: 59

## 2013-09-01 DIAGNOSIS — F3289 Other specified depressive episodes: Secondary | ICD-10-CM | POA: Insufficient documentation

## 2013-09-01 DIAGNOSIS — F329 Major depressive disorder, single episode, unspecified: Secondary | ICD-10-CM | POA: Diagnosis not present

## 2013-09-01 DIAGNOSIS — E7800 Pure hypercholesterolemia, unspecified: Secondary | ICD-10-CM | POA: Diagnosis not present

## 2013-09-01 DIAGNOSIS — E1365 Other specified diabetes mellitus with hyperglycemia: Secondary | ICD-10-CM | POA: Diagnosis present

## 2013-09-01 DIAGNOSIS — K589 Irritable bowel syndrome without diarrhea: Secondary | ICD-10-CM | POA: Diagnosis not present

## 2013-09-01 DIAGNOSIS — E119 Type 2 diabetes mellitus without complications: Secondary | ICD-10-CM | POA: Diagnosis not present

## 2013-09-01 DIAGNOSIS — I10 Essential (primary) hypertension: Secondary | ICD-10-CM | POA: Insufficient documentation

## 2013-09-01 DIAGNOSIS — F411 Generalized anxiety disorder: Secondary | ICD-10-CM | POA: Insufficient documentation

## 2013-09-01 DIAGNOSIS — I200 Unstable angina: Secondary | ICD-10-CM | POA: Diagnosis present

## 2013-09-01 DIAGNOSIS — F319 Bipolar disorder, unspecified: Secondary | ICD-10-CM | POA: Diagnosis present

## 2013-09-01 DIAGNOSIS — I359 Nonrheumatic aortic valve disorder, unspecified: Secondary | ICD-10-CM

## 2013-09-01 DIAGNOSIS — R079 Chest pain, unspecified: Secondary | ICD-10-CM | POA: Diagnosis present

## 2013-09-01 DIAGNOSIS — R197 Diarrhea, unspecified: Secondary | ICD-10-CM | POA: Diagnosis not present

## 2013-09-01 DIAGNOSIS — IMO0002 Reserved for concepts with insufficient information to code with codable children: Secondary | ICD-10-CM | POA: Diagnosis present

## 2013-09-01 DIAGNOSIS — E785 Hyperlipidemia, unspecified: Secondary | ICD-10-CM

## 2013-09-01 HISTORY — DX: Pure hypercholesterolemia, unspecified: E78.00

## 2013-09-01 HISTORY — DX: Unspecified osteoarthritis, unspecified site: M19.90

## 2013-09-01 HISTORY — DX: Type 2 diabetes mellitus without complications: E11.9

## 2013-09-01 LAB — HEMOGLOBIN A1C
HEMOGLOBIN A1C: 7.3 % — AB (ref <5.7)
MEAN PLASMA GLUCOSE: 163 mg/dL — AB (ref <117)

## 2013-09-01 LAB — CBC WITH DIFFERENTIAL/PLATELET
Basophils Absolute: 0 K/uL (ref 0.0–0.1)
Basophils Relative: 0 % (ref 0–1)
Eosinophils Absolute: 0.1 K/uL (ref 0.0–0.7)
Eosinophils Relative: 2 % (ref 0–5)
HCT: 42.5 % (ref 36.0–46.0)
Hemoglobin: 14.1 g/dL (ref 12.0–15.0)
Lymphocytes Relative: 25 % (ref 12–46)
Lymphs Abs: 1.9 K/uL (ref 0.7–4.0)
MCH: 31.5 pg (ref 26.0–34.0)
MCHC: 33.2 g/dL (ref 30.0–36.0)
MCV: 95.1 fL (ref 78.0–100.0)
Monocytes Absolute: 0.7 K/uL (ref 0.1–1.0)
Monocytes Relative: 9 % (ref 3–12)
Neutro Abs: 5 K/uL (ref 1.7–7.7)
Neutrophils Relative %: 65 % (ref 43–77)
Platelets: 232 K/uL (ref 150–400)
RBC: 4.47 MIL/uL (ref 3.87–5.11)
RDW: 12.9 % (ref 11.5–15.5)
WBC: 7.8 K/uL (ref 4.0–10.5)

## 2013-09-01 LAB — COMPREHENSIVE METABOLIC PANEL
ALBUMIN: 3.4 g/dL — AB (ref 3.5–5.2)
ALK PHOS: 67 U/L (ref 39–117)
ALT: 25 U/L (ref 0–35)
AST: 36 U/L (ref 0–37)
BILIRUBIN TOTAL: 0.3 mg/dL (ref 0.3–1.2)
BUN: 7 mg/dL (ref 6–23)
CHLORIDE: 96 meq/L (ref 96–112)
CO2: 29 meq/L (ref 19–32)
CREATININE: 0.4 mg/dL — AB (ref 0.50–1.10)
Calcium: 9.5 mg/dL (ref 8.4–10.5)
GFR calc Af Amer: >90 mL/min (ref >90)
Glucose, Bld: 157 mg/dL — ABNORMAL HIGH (ref 70–99)
POTASSIUM: 4.3 meq/L (ref 3.7–5.3)
Sodium: 138 mEq/L (ref 137–147)
Total Protein: 7.4 g/dL (ref 6.0–8.3)

## 2013-09-01 LAB — URINALYSIS, ROUTINE W REFLEX MICROSCOPIC
Bilirubin Urine: NEGATIVE
GLUCOSE, UA: NEGATIVE mg/dL
Hgb urine dipstick: NEGATIVE
KETONES UR: NEGATIVE mg/dL
Leukocytes, UA: NEGATIVE
Nitrite: NEGATIVE
PROTEIN: 30 mg/dL — AB
Specific Gravity, Urine: 1.005 (ref 1.005–1.030)
UROBILINOGEN UA: 0.2 mg/dL (ref 0.0–1.0)
pH: 7.5 (ref 5.0–8.0)

## 2013-09-01 LAB — GLUCOSE, CAPILLARY: Glucose-Capillary: 159 mg/dL — ABNORMAL HIGH (ref 70–99)

## 2013-09-01 LAB — TROPONIN I: Troponin I: <0.3 ng/mL (ref <0.30)

## 2013-09-01 LAB — TSH: TSH: 1.377 u[IU]/mL (ref 0.350–4.500)

## 2013-09-01 LAB — URINE MICROSCOPIC-ADD ON

## 2013-09-01 LAB — HEPARIN LEVEL (UNFRACTIONATED)

## 2013-09-01 MED ORDER — PROPRANOLOL HCL 20 MG PO TABS
20.0000 mg | ORAL_TABLET | Freq: Two times a day (BID) | ORAL | Status: DC
  Administered 2013-09-01 – 2013-09-02 (×2): 20 mg via ORAL
  Filled 2013-09-01 (×3): qty 1

## 2013-09-01 MED ORDER — ACETAMINOPHEN 325 MG PO TABS: 650.0000 mg | ORAL_TABLET | ORAL | Status: DC | PRN

## 2013-09-01 MED ORDER — ASPIRIN EC 81 MG PO TBEC
81.0000 mg | DELAYED_RELEASE_TABLET | Freq: Every day | ORAL | Status: DC
  Administered 2013-09-02: 81 mg via ORAL
  Filled 2013-09-01: qty 1

## 2013-09-01 MED ORDER — ALPRAZOLAM 0.5 MG PO TABS
1.0000 mg | ORAL_TABLET | Freq: Two times a day (BID) | ORAL | Status: DC
  Administered 2013-09-01 – 2013-09-02 (×2): 1 mg via ORAL
  Filled 2013-09-01: qty 2

## 2013-09-01 MED ORDER — METOPROLOL TARTRATE 1 MG/ML IV SOLN
5.0000 mg | Freq: Once | INTRAVENOUS | Status: AC
  Administered 2013-09-01: 5 mg via INTRAVENOUS
  Filled 2013-09-01: qty 5

## 2013-09-01 MED ORDER — HEPARIN BOLUS VIA INFUSION
4000.0000 [IU] | Freq: Once | INTRAVENOUS | Status: AC
  Administered 2013-09-01: 4000 [IU] via INTRAVENOUS

## 2013-09-01 MED ORDER — DIVALPROEX SODIUM 500 MG PO DR TAB
500.0000 mg | DELAYED_RELEASE_TABLET | Freq: Two times a day (BID) | ORAL | Status: DC
  Administered 2013-09-01 – 2013-09-02 (×2): 500 mg via ORAL
  Filled 2013-09-01 (×4): qty 1

## 2013-09-01 MED ORDER — LINAGLIPTIN 5 MG PO TABS
5.0000 mg | ORAL_TABLET | Freq: Every day | ORAL | Status: DC
  Administered 2013-09-02: 5 mg via ORAL
  Filled 2013-09-01 (×2): qty 1

## 2013-09-01 MED ORDER — REGADENOSON 0.4 MG/5ML IV SOLN
0.4000 mg | Freq: Once | INTRAVENOUS | Status: AC
  Administered 2013-09-02: 0.4 mg via INTRAVENOUS
  Filled 2013-09-01: qty 5

## 2013-09-01 MED ORDER — SIMVASTATIN 40 MG PO TABS
40.0000 mg | ORAL_TABLET | Freq: Every day | ORAL | Status: DC
  Administered 2013-09-02: 40 mg via ORAL
  Filled 2013-09-01 (×2): qty 1

## 2013-09-01 MED ORDER — RISPERIDONE 1 MG PO TABS
1.0000 mg | ORAL_TABLET | Freq: Two times a day (BID) | ORAL | Status: DC
  Administered 2013-09-01 – 2013-09-02 (×2): 1 mg via ORAL
  Filled 2013-09-01 (×3): qty 1

## 2013-09-01 MED ORDER — CYCLOBENZAPRINE HCL 10 MG PO TABS
10.0000 mg | ORAL_TABLET | Freq: Two times a day (BID) | ORAL | Status: DC | PRN
  Filled 2013-09-01: qty 1

## 2013-09-01 MED ORDER — NITROGLYCERIN 0.4 MG SL SUBL: 0.4000 mg | SUBLINGUAL_TABLET | SUBLINGUAL | Status: DC | PRN

## 2013-09-01 MED ORDER — LISINOPRIL 40 MG PO TABS
40.0000 mg | ORAL_TABLET | Freq: Every day | ORAL | Status: DC
  Administered 2013-09-02: 40 mg via ORAL
  Filled 2013-09-01 (×2): qty 1

## 2013-09-01 MED ORDER — LORAZEPAM 2 MG/ML IJ SOLN
2.0000 mg | Freq: Once | INTRAMUSCULAR | Status: AC
  Administered 2013-09-01: 2 mg via INTRAVENOUS
  Filled 2013-09-01: qty 1

## 2013-09-01 MED ORDER — DICYCLOMINE HCL 20 MG PO TABS
20.0000 mg | ORAL_TABLET | Freq: Three times a day (TID) | ORAL | Status: DC | PRN
  Filled 2013-09-01: qty 1

## 2013-09-01 MED ORDER — BUPROPION HCL ER (XL) 300 MG PO TB24
300.0000 mg | ORAL_TABLET | Freq: Every day | ORAL | Status: DC
  Administered 2013-09-02: 300 mg via ORAL
  Filled 2013-09-01: qty 1

## 2013-09-01 MED ORDER — BUPROPION HCL ER (XL) 150 MG PO TB24: 150.0000 mg | ORAL_TABLET | Freq: Three times a day (TID) | ORAL | Status: DC

## 2013-09-01 MED ORDER — PERFLUTREN LIPID MICROSPHERE
1.0000 mL | INTRAVENOUS | Status: AC | PRN
  Administered 2013-09-01: 2 mL via INTRAVENOUS
  Filled 2013-09-01: qty 10

## 2013-09-01 MED ORDER — OXYBUTYNIN CHLORIDE ER 5 MG PO TB24
5.0000 mg | ORAL_TABLET | Freq: Every day | ORAL | Status: DC
  Administered 2013-09-01 – 2013-09-02 (×2): 5 mg via ORAL
  Filled 2013-09-01 (×2): qty 1

## 2013-09-01 MED ORDER — INSULIN ASPART 100 UNIT/ML ~~LOC~~ SOLN: 0.0000 [IU] | Freq: Three times a day (TID) | SUBCUTANEOUS | Status: DC

## 2013-09-01 MED ORDER — HEPARIN BOLUS VIA INFUSION
3000.0000 [IU] | Freq: Once | INTRAVENOUS | Status: AC
  Administered 2013-09-01: 3000 [IU] via INTRAVENOUS
  Filled 2013-09-01: qty 3000

## 2013-09-01 MED ORDER — ONDANSETRON HCL 4 MG/2ML IJ SOLN: 4.0000 mg | Freq: Four times a day (QID) | INTRAMUSCULAR | Status: DC | PRN

## 2013-09-01 MED ORDER — ASPIRIN 81 MG PO CHEW
324.0000 mg | CHEWABLE_TABLET | Freq: Once | ORAL | Status: AC
  Administered 2013-09-01: 324 mg via ORAL
  Filled 2013-09-01: qty 4

## 2013-09-01 MED ORDER — HEPARIN (PORCINE) IN NACL 100-0.45 UNIT/ML-% IJ SOLN
1500.0000 [IU]/h | INTRAMUSCULAR | Status: DC
  Administered 2013-09-01: 1000 [IU]/h via INTRAVENOUS
  Administered 2013-09-02: 1250 [IU]/h via INTRAVENOUS
  Filled 2013-09-01 (×4): qty 250

## 2013-09-01 NOTE — H&P (Addendum)
Patient seen, interviewed and examined with PA.  Presented with chest pain for at least 5-10 minutes that was severe and crushing with SOB.  No further pain since admit.  Significant family history of CAD at early age.  CRF include obesity/HTN/dyslpidemia.  Initial cardiac enzymes normal and EKG nonspecific ST abnormality.  Will admit for rule out.  Cycle cardiac enzymes, IV Heparin gtt, ASA, continue beta blocker.  2D echo to assess LVF.  NPO after MN for nuclear stress test in am if she rules out.

## 2013-09-01 NOTE — ED Notes (Signed)
PO fluids provided. 

## 2013-09-01 NOTE — Progress Notes (Signed)
ANTICOAGULATION CONSULT NOTE - Initial Consult  Pharmacy Consult for Heparin Indication: chest pain/ACS  Allergies  Allergen Reactions  . Benztropine   . Penicillins     Patient Measurements:   Heparin Dosing Weight: 70 kg  Vital Signs: Temp: 98 F (36.7 C) (02/06 1304) Temp src: Oral (02/06 1304) BP: 150/77 mmHg (02/06 1304) Pulse Rate: 82 (02/06 1304)  Labs:  Recent Labs  09/01/13 1100  HGB 14.1  HCT 42.5  PLT 232  CREATININE 0.40*  TROPONINI <0.30    The CrCl is unknown because both a height and weight (above a minimum accepted value) are required for this calculation.   Medical History: Past Medical History  Diagnosis Date  . Depression     sess Dr.Plovsky  . 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 IBS (irritable bowel syndrome)     ? of IBS  . Hypertension   . High cholesterol   . Type II diabetes mellitus 2007  . Chronic diarrhea of unknown origin     "nearly qd" (09/01/2013)  . Migraine     "long long time ago; maybe 20 yr ago" (09/01/2013)  . Osteoarthritis     "back; goes down my right leg" (09/01/2013)  . Anxiety       Assessment: 52yof admitted with new onset CP today.  Initial tp neg, CBC stable, renal fx stable.  Heparin was started in ED and pharmacy will follow labs abd adjust as needed.    Goal of Therapy:  Heparin level 0.3-0.7 units/ml Monitor platelets by anticoagulation protocol: Yes   Plan:  Heparin bolus 4000 uts/hr Heparin drip 1000 uts/hr Above started in ED  HL 6hr after start Daily CBC, HL  Leota SauersLisa Ethelreda Sukhu Pharm.D. CPP, BCPS Clinical Pharmacist [phone] 09/01/2013 4:50 PM

## 2013-09-01 NOTE — H&P (Signed)
Adrienne Bennett is an 53 y.o. female.   Chief Complaint: Chest pain HPI:   The patient is a 53 yo obese caucasian female with a history of depression, bipolar affective DO, IBS, HTN, HLD, DMII, chronic diarrhea.  No prior cardiac history but she dos have a significant family history of MI in father and brother at young ages.  She developed "massive, crushing" CP today while listening to music in the kitchen.  She says she couldn't move both arms, talk, or breath for about 5 mins.  She drove herself to the ER.  The pain resolved prior to getting there.  The patient currently denies nausea, vomiting, fever, orthopnea, dizziness, PND, cough, congestion, hematochezia, melena, lower extremity edema, claudication.  She has chronic abdominal pain from IBS.  She was started on IV heparin.    Past Medical History  Diagnosis Date  . Depression     sess Dr.Plovsky  . 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 IBS (irritable bowel syndrome)     ? of IBS  . Hypertension   . High cholesterol   . Type II diabetes mellitus 2007  . Chronic diarrhea of unknown origin     "nearly qd" (09/01/2013)  . Migraine     "long long time ago; maybe 20 yr ago" (09/01/2013)  . Osteoarthritis     "back; goes down my right leg" (09/01/2013)  . Anxiety     Past Surgical History  Procedure Laterality Date  . Carpal tunnel release Bilateral ~ 2000  . Tubal ligation  1996  . Tonsillectomy  1970    Family History  Problem Relation Age of Onset  . Diabetes Mother   . Diabetes Sister   . Diabetes Maternal Grandmother   . Coronary artery disease Father     age?  . Coronary artery disease Brother     age?  Marland Kitchen Hypertension Brother   . Hypertension Sister   . Colon cancer Neg Hx   . Breast cancer Neg Hx    Social History:  reports that she has never smoked. She has never used smokeless tobacco. She reports  that she does not drink alcohol or use illicit drugs.  Allergies:  Allergies  Allergen Reactions  . Benztropine   . Penicillins     Medications Prior to Admission  Medication Sig Dispense Refill  . ALPRAZolam (XANAX) 1 MG tablet Take 1 tab in the am 2 tabs in the pm and 1 tab once daily as needed.       Marland Kitchen buPROPion (WELLBUTRIN XL) 150 MG 24 hr tablet Take 150 mg by mouth 3 (three) times daily. 4 tabs daily.      . clotrimazole-betamethasone (LOTRISONE) cream Apply topically 2 (two) times daily.  60 g  0  . cyclobenzaprine (FLEXERIL) 10 MG tablet Take 1 tablet (10 mg total) by mouth 2 (two) times daily as needed for muscle spasms.  60 tablet  0  . dicyclomine (BENTYL) 20 MG tablet Take 20 mg by mouth 3 (three) times daily as needed.      . divalproex (DEPAKOTE) 500 MG 24 hr tablet 4 tabs daily.       Marland Kitchen glucose blood (ONE TOUCH ULTRA TEST) test strip Check blood sugar once or twice a day, Always check in the morning before breakfast, the next check needs to be 2 hours after a meal.  Dx 250.00  100 each  12  . Lancets (ONETOUCH ULTRASOFT) lancets Check blood sugar once a day. Dx 250.00  100 each  12  . lisinopril (PRINIVIL,ZESTRIL) 40 MG tablet Take 1 tablet (40 mg total) by mouth daily.  90 tablet  1  . meloxicam (MOBIC) 7.5 MG tablet Take 1 tablet (7.5 mg total) by mouth daily.  90 tablet  1  . metFORMIN (GLUCOPHAGE) 500 MG tablet Take 2 tablets (1,000 mg total) by mouth 2 (two) times daily with a meal.  360 tablet  1  . oxybutynin (DITROPAN-XL) 5 MG 24 hr tablet Take 5 mg by mouth daily.      . propranolol (INDERAL) 10 MG tablet Take 2 tablets (20 mg total) by mouth 2 (two) times daily.  120 tablet  11  . risperiDONE (RISPERDAL) 1 MG tablet Take 1 mg by mouth 2 (two) times daily.      . simvastatin (ZOCOR) 40 MG tablet Take 1 tablet (40 mg total) by mouth daily.  90 tablet  1  . sitaGLIPtin (JANUVIA) 100 MG tablet Take 1 tablet (100 mg total) by mouth daily.  30 tablet  1    Results for  orders placed during the hospital encounter of 09/01/13 (from the past 48 hour(s))  CBC WITH DIFFERENTIAL     Status: None   Collection Time    09/01/13 11:00 AM      Result Value Range   WBC 7.8  4.0 - 10.5 K/uL   RBC 4.47  3.87 - 5.11 MIL/uL   Hemoglobin 14.1  12.0 - 15.0 g/dL   HCT 42.5  36.0 - 46.0 %   MCV 95.1  78.0 - 100.0 fL   MCH 31.5  26.0 - 34.0 pg   MCHC 33.2  30.0 - 36.0 g/dL   RDW 12.9  11.5 - 15.5 %   Platelets 232  150 - 400 K/uL   Neutrophils Relative % 65  43 - 77 %   Neutro Abs 5.0  1.7 - 7.7 K/uL   Lymphocytes Relative 25  12 - 46 %   Lymphs Abs 1.9  0.7 - 4.0 K/uL   Monocytes Relative 9  3 - 12 %   Monocytes Absolute 0.7  0.1 - 1.0 K/uL   Eosinophils Relative 2  0 - 5 %   Eosinophils Absolute 0.1  0.0 - 0.7 K/uL   Basophils Relative 0  0 - 1 %   Basophils Absolute 0.0  0.0 - 0.1 K/uL  COMPREHENSIVE METABOLIC PANEL     Status: Abnormal   Collection Time    09/01/13 11:00 AM      Result Value Range   Sodium 138  137 - 147 mEq/L   Potassium 4.3  3.7 - 5.3 mEq/L   Chloride 96  96 - 112 mEq/L   CO2 29  19 - 32 mEq/L   Glucose, Bld 157 (*) 70 - 99 mg/dL   BUN 7  6 - 23 mg/dL   Creatinine, Ser 0.40 (*) 0.50 - 1.10 mg/dL   Calcium 9.5  8.4 - 10.5 mg/dL   Total Protein 7.4  6.0 - 8.3 g/dL   Albumin 3.4 (*) 3.5 - 5.2 g/dL   AST 36  0 - 37 U/L   ALT 25  0 - 35 U/L   Alkaline Phosphatase 67  39 - 117 U/L   Total Bilirubin 0.3  0.3 - 1.2 mg/dL   GFR calc non Af Amer >90  >90 mL/min   GFR calc  Af Amer >90  >90 mL/min   Comment: (NOTE)     The eGFR has been calculated using the CKD EPI equation.     This calculation has not been validated in all clinical situations.     eGFR's persistently <90 mL/min signify possible Chronic Kidney     Disease.  TROPONIN I     Status: None   Collection Time    09/01/13 11:00 AM      Result Value Range   Troponin I <0.30  <0.30 ng/mL   Comment:            Due to the release kinetics of cTnI,     a negative result within the  first hours     of the onset of symptoms does not rule out     myocardial infarction with certainty.     If myocardial infarction is still suspected,     repeat the test at appropriate intervals.  URINALYSIS, ROUTINE W REFLEX MICROSCOPIC     Status: Abnormal   Collection Time    09/01/13 11:28 AM      Result Value Range   Color, Urine YELLOW  YELLOW   APPearance CLEAR  CLEAR   Specific Gravity, Urine 1.005  1.005 - 1.030   pH 7.5  5.0 - 8.0   Glucose, UA NEGATIVE  NEGATIVE mg/dL   Hgb urine dipstick NEGATIVE  NEGATIVE   Bilirubin Urine NEGATIVE  NEGATIVE   Ketones, ur NEGATIVE  NEGATIVE mg/dL   Protein, ur 30 (*) NEGATIVE mg/dL   Urobilinogen, UA 0.2  0.0 - 1.0 mg/dL   Nitrite NEGATIVE  NEGATIVE   Leukocytes, UA NEGATIVE  NEGATIVE  URINE MICROSCOPIC-ADD ON     Status: Abnormal   Collection Time    09/01/13 11:28 AM      Result Value Range   Squamous Epithelial / LPF FEW (*) RARE   WBC, UA 0-2  <3 WBC/hpf   RBC / HPF 0-2  <3 RBC/hpf   Bacteria, UA FEW (*) RARE   Dg Chest 2 View  09/01/2013   CLINICAL DATA:  Severe chest pain  EXAM: CHEST  2 VIEW  COMPARISON:  None.  FINDINGS: The lungs are well-expanded. The cardiac silhouette is top-normal in size. The central pulmonary vascularity is prominent. There is no significant pleural effusion and no evidence of a pneumothorax. There is moderate mid thoracic dextroscoliosis which distorts the anatomy.  IMPRESSION: There is mild prominence of the central pulmonary vascularity which in the appropriate clinical setting could reflect low grade CHF. There is no interstitial or alveolar edema nor pleural effusion. There is no pneumothorax.   Electronically Signed   By: David  Martinique   On: 09/01/2013 12:34    Review of Systems  Constitutional: Negative for fever and diaphoresis.  HENT: Negative for congestion and sore throat.   Eyes: Negative for blurred vision.  Respiratory: Positive for shortness of breath. Negative for cough and wheezing.     Cardiovascular: Positive for chest pain ("Massive, crushing"). Negative for orthopnea, leg swelling and PND.  Gastrointestinal: Positive for abdominal pain (Chronic). Negative for nausea, vomiting, diarrhea, blood in stool and melena.  Genitourinary: Negative for hematuria.  Musculoskeletal: Positive for myalgias.  All other systems reviewed and are negative.    Blood pressure 150/77, pulse 82, temperature 98 F (36.7 C), temperature source Oral, resp. rate 18, last menstrual period 02/14/2013, SpO2 99.00%. Physical Exam   Assessment/Plan Principal Problem:   Unstable angina Active Problems:   DIABETES  MELLITUS, TYPE II   IBS ?   HYPERTENSION   Bipolar affective disorder  Plan:  Admit for obs.  Cycle troponin. TSH, Lipids.  Continue heparin.  NPO after MN for Lexiscan in the morning. Echo.  If troponin +, then coronary angiogram.  BB and statin.   Joe Tanney 09/01/2013, 4:36 PM

## 2013-09-01 NOTE — Progress Notes (Addendum)
ANTICOAGULATION CONSULT NOTE - Follow Up Consult  Pharmacy Consult for Heparin  Indication: chest pain/ACS  Allergies  Allergen Reactions  . Benztropine   . Penicillins    Patient Measurements: Heparin Dosing Weight: ~70 kg  Vital Signs: Temp: 98.3 F (36.8 C) (02/06 2048) Temp src: Oral (02/06 2048) BP: 177/99 mmHg (02/06 2048) Pulse Rate: 88 (02/06 2048)  Labs:  Recent Labs  09/01/13 1100 09/01/13 1633 09/01/13 2220 09/01/13 2227  HGB 14.1  --   --   --   HCT 42.5  --   --   --   PLT 232  --   --   --   HEPARINUNFRC  --   --   --  <0.10*  CREATININE 0.40*  --   --   --   TROPONINI <0.30 <0.30 <0.30  --    The CrCl is unknown because both a height and weight (above a minimum accepted value) are required for this calculation.  Medications:  Heparin 1000 units/hr  Assessment: 53 y/o F on heparin for CP. HL is undetectable. Other labs as above. No issues per RN.   Goal of Therapy:  Heparin level 0.3-0.7 units/ml Monitor platelets by anticoagulation protocol: Yes   Plan:  -Heparin 3000 units BOLUS x 1 -Increase heparin drip to 1250 units/hr -AM HL -Daily CBC/HL -Monitor for bleeding  Abran DukeLedford, Geisha Abernathy 09/01/2013,11:38 PM  Addendum 4:55 AM HL this AM is 0.53, continue heparin at 1250 units/hr, confirmatory HL at 1200 JLedford, PharmD

## 2013-09-01 NOTE — Progress Notes (Signed)
  Echocardiogram 2D Echocardiogram has been performed.  Georgian CoWILLIAMS, Temeka Pore 09/01/2013, 6:22 PM

## 2013-09-01 NOTE — ED Provider Notes (Addendum)
CSN: [id number]     Arrival date & time 09/01/13  1042 History   First MD Initiated Contact with Patient 09/01/13 1101     Chief Complaint  Patient presents with  . chest pain-resolved    (Consider location/radiation/quality/duration/timing/severity/associated sxs/prior Treatment) Patient is a 53 y.o. female presenting with chest pain. The history is provided by the patient.  Chest Pain Pain location:  Substernal area Pain quality: crushing and pressure   Pain radiates to:  L shoulder and R shoulder Pain radiates to the back: yes   Pain severity:  Severe Onset quality:  Sudden Duration:  5 minutes Timing:  Constant Progression:  Resolved Chronicity:  New Context: at rest   Context: not eating   Relieved by:  None tried Worsened by:  Nothing tried Ineffective treatments:  None tried Associated symptoms: no abdominal pain, no cough, no nausea, no near-syncope, no shortness of breath and not vomiting   Risk factors: diabetes mellitus, high cholesterol and hypertension   Risk factors comment:  Her sister and brother who are both and her have had MIs. Dad had MIs and died in his 78s   Past Medical History  Diagnosis Date  . Depression     sess Dr.Plovsky  . 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 Diabetes mellitus 2007  . IBS (irritable bowel syndrome)     ? of IBS  . Migraine   . Hypertension   . SLE (systemic lupus erythematosus)     ??SLE -- as Dx by nephrology aprox 2007 , saw rheumatology who disagreed w/  Dx , was told she does not have Lupus --- (per pt )  . Other and unspecified hyperlipidemia 10/12/2012   Past Surgical History  Procedure Laterality Date  . Tubal ligation    . Tonsillectomy    . Carpal tunnel release     Family History  Problem Relation Age of Onset  . Diabetes Mother   . Diabetes Sister   . Diabetes Maternal Grandmother   . Coronary  artery disease Father     age?  . Coronary artery disease Brother     age?  Marland Kitchen Hypertension Brother   . Hypertension Sister   . Colon cancer Neg Hx   . Breast cancer Neg Hx    History  Substance Use Topics  . Smoking status: Never Smoker   . Smokeless tobacco: Never Used  . Alcohol Use: No   OB History   Grav Para Term Preterm Abortions TAB SAB Ect Mult Living                 Review of Systems  Respiratory: Negative for cough and shortness of breath.   Cardiovascular: Positive for chest pain. Negative for near-syncope.  Gastrointestinal: Negative for nausea, vomiting and abdominal pain.  All other systems reviewed and are negative.    Allergies  Benztropine and Penicillins  Home Medications   Current Outpatient Rx  Name  Route  Sig  Dispense  Refill  . ALPRAZolam (XANAX) 1 MG tablet      Take 1 tab in the am 2 tabs in the pm and 1 tab once daily as needed.          Marland Kitchen buPROPion (WELLBUTRIN XL) 150 MG 24 hr tablet   Oral   Take 150 mg by mouth 3 (three) times daily. 4 tabs daily.         Marland Kitchen  clotrimazole-betamethasone (LOTRISONE) cream   Topical   Apply topically 2 (two) times daily.   60 g   0   . cyclobenzaprine (FLEXERIL) 10 MG tablet   Oral   Take 1 tablet (10 mg total) by mouth 2 (two) times daily as needed for muscle spasms.   60 tablet   0   . dicyclomine (BENTYL) 20 MG tablet   Oral   Take 20 mg by mouth 3 (three) times daily as needed.         . divalproex (DEPAKOTE) 500 MG 24 hr tablet      4 tabs daily.          Marland Kitchen glucose blood (ONE TOUCH ULTRA TEST) test strip      Check blood sugar once or twice a day, Always check in the morning before breakfast, the next check needs to be 2 hours after a meal.  Dx 250.00   100 each   12   . Lancets (ONETOUCH ULTRASOFT) lancets      Check blood sugar once a day. Dx 250.00   100 each   12   . lisinopril (PRINIVIL,ZESTRIL) 40 MG tablet   Oral   Take 1 tablet (40 mg total) by mouth daily.    90 tablet   1   . meloxicam (MOBIC) 7.5 MG tablet   Oral   Take 1 tablet (7.5 mg total) by mouth daily.   90 tablet   1   . metFORMIN (GLUCOPHAGE) 500 MG tablet   Oral   Take 2 tablets (1,000 mg total) by mouth 2 (two) times daily with a meal.   360 tablet   1   . oxybutynin (DITROPAN-XL) 5 MG 24 hr tablet   Oral   Take 5 mg by mouth daily.         . propranolol (INDERAL) 10 MG tablet   Oral   Take 2 tablets (20 mg total) by mouth 2 (two) times daily.   120 tablet   11   . risperiDONE (RISPERDAL) 1 MG tablet   Oral   Take 1 mg by mouth 2 (two) times daily.         . simvastatin (ZOCOR) 40 MG tablet   Oral   Take 1 tablet (40 mg total) by mouth daily.   90 tablet   1   . sitaGLIPtin (JANUVIA) 100 MG tablet   Oral   Take 1 tablet (100 mg total) by mouth daily.   30 tablet   1    BP 166/87  Pulse 88  Temp(Src) 98.1 F (36.7 C) (Oral)  Resp 16  SpO2 98%  LMP 02/14/2013 Physical Exam  Nursing note and vitals reviewed. Constitutional: She is oriented to person, place, and time. She appears well-developed and well-nourished. No distress.  HENT:  Head: Normocephalic and atraumatic.  Mouth/Throat: Oropharynx is clear and moist.  Eyes: Conjunctivae and EOM are normal. Pupils are equal, round, and reactive to light.  Neck: Normal range of motion. Neck supple.  Cardiovascular: Normal rate, regular rhythm and intact distal pulses.   No murmur heard. Pulmonary/Chest: Effort normal and breath sounds normal. No respiratory distress. She has no wheezes. She has no rales.  Abdominal: Soft. She exhibits no distension. There is no tenderness. There is no rebound and no guarding.  Central obesity  Musculoskeletal: Normal range of motion. She exhibits no edema and no tenderness.  Neurological: She is alert and oriented to person, place, and time.  Skin: Skin is warm  and dry. No rash noted. No erythema.  Psychiatric: She has a normal mood and affect. Her behavior is  normal.    ED Course  Procedures (including critical care time) Labs Review Labs Reviewed  COMPREHENSIVE METABOLIC PANEL - Abnormal; Notable for the following:    Glucose, Bld 157 (*)    Creatinine, Ser 0.40 (*)    Albumin 3.4 (*)    All other components within normal limits  URINALYSIS, ROUTINE W REFLEX MICROSCOPIC - Abnormal; Notable for the following:    Protein, ur 30 (*)    All other components within normal limits  URINE MICROSCOPIC-ADD ON - Abnormal; Notable for the following:    Squamous Epithelial / LPF FEW (*)    Bacteria, UA FEW (*)    All other components within normal limits  CBC WITH DIFFERENTIAL  TROPONIN I   Imaging Review Dg Chest 2 View  09/01/2013   CLINICAL DATA:  Severe chest pain  EXAM: CHEST  2 VIEW  COMPARISON:  None.  FINDINGS: The lungs are well-expanded. The cardiac silhouette is top-normal in size. The central pulmonary vascularity is prominent. There is no significant pleural effusion and no evidence of a pneumothorax. There is moderate mid thoracic dextroscoliosis which distorts the anatomy.  IMPRESSION: There is mild prominence of the central pulmonary vascularity which in the appropriate clinical setting could reflect low grade CHF. There is no interstitial or alveolar edema nor pleural effusion. There is no pneumothorax.   Electronically Signed   By: David  [place]   On: 09/01/2013 12:34    EKG Interpretation    Date/Time:  Friday September 01 2013 10:51:56 EST Ventricular Rate:  86 PR Interval:  146 QRS Duration: 84 QT Interval:  354 QTC Calculation: 423 R Axis:   79 Text Interpretation:  Normal sinus rhythm T wave abnormality, consider inferior ischemia No significant change since last tracing Confirmed by Anitra LauthPLUNKETT  MD, Aralyn Nowak ([id number]) on 09/01/2013 10:54:10 AM            MDM   1. Unstable angina     Pt with symptoms concerning for ACS.  Multiple risk factors and concerning story.  Positive family hx in parents and siblings.  Denies  tobacco use.  Low suspicion for PE. ASA  Given. But currently pain free. EKG, CXR, CBC, CMP, troponin, Coags pending.  12:31 PM Initial labs and imaging wnl.  Pt's story very concerning and will discuss with cardiology.   Gwyneth SproutWhitney Fatoumata Albaugh, MD 09/01/13 1231  Gwyneth SproutWhitney Jazzlynn Rawe, MD 09/01/13 1242

## 2013-09-01 NOTE — ED Notes (Signed)
Pt sts she had a "crushing" pain in chest this morning while sitting at the table drinking a sprite. Pt sts she was unable to move her arms and unable to speak, episode lasted approximately 5 mins and have resolved completely. Pt sts her husband and psychotherapist told her she should get checked. Pt drove self to ED.

## 2013-09-02 ENCOUNTER — Observation Stay (HOSPITAL_COMMUNITY): Payer: 59

## 2013-09-02 DIAGNOSIS — I200 Unstable angina: Secondary | ICD-10-CM

## 2013-09-02 DIAGNOSIS — R079 Chest pain, unspecified: Secondary | ICD-10-CM | POA: Diagnosis not present

## 2013-09-02 LAB — GLUCOSE, CAPILLARY
GLUCOSE-CAPILLARY: 149 mg/dL — AB (ref 70–99)
Glucose-Capillary: 120 mg/dL — ABNORMAL HIGH (ref 70–99)
Glucose-Capillary: 143 mg/dL — ABNORMAL HIGH (ref 70–99)

## 2013-09-02 LAB — BASIC METABOLIC PANEL
BUN: 10 mg/dL (ref 6–23)
CALCIUM: 8.2 mg/dL — AB (ref 8.4–10.5)
CO2: 29 mEq/L (ref 19–32)
Chloride: 98 mEq/L (ref 96–112)
Creatinine, Ser: 0.41 mg/dL — ABNORMAL LOW (ref 0.50–1.10)
GFR calc Af Amer: >90 mL/min (ref >90)
GFR calc non Af Amer: >90 mL/min (ref >90)
Glucose, Bld: 138 mg/dL — ABNORMAL HIGH (ref 70–99)
Potassium: 4.1 mEq/L (ref 3.7–5.3)
SODIUM: 139 meq/L (ref 137–147)

## 2013-09-02 LAB — CBC
HCT: 39.8 % (ref 36.0–46.0)
Hemoglobin: 13.2 g/dL (ref 12.0–15.0)
MCH: 31.3 pg (ref 26.0–34.0)
MCHC: 33.2 g/dL (ref 30.0–36.0)
MCV: 94.3 fL (ref 78.0–100.0)
PLATELETS: 210 10*3/uL (ref 150–400)
RBC: 4.22 MIL/uL (ref 3.87–5.11)
RDW: 13.3 % (ref 11.5–15.5)
WBC: 7.5 10*3/uL (ref 4.0–10.5)

## 2013-09-02 LAB — LIPID PANEL
CHOL/HDL RATIO: 3.5 ratio
Cholesterol: 126 mg/dL (ref 0–200)
HDL: 36 mg/dL — ABNORMAL LOW (ref >39)
LDL Cholesterol: 60 mg/dL (ref 0–99)
Triglycerides: 151 mg/dL — ABNORMAL HIGH (ref <150)
VLDL: 30 mg/dL (ref 0–40)

## 2013-09-02 LAB — HEPARIN LEVEL (UNFRACTIONATED)
Heparin Unfractionated: 0.53 IU/mL (ref 0.30–0.70)
Heparin Unfractionated: 0.15 IU/mL — ABNORMAL LOW (ref 0.30–0.70)

## 2013-09-02 LAB — TROPONIN I

## 2013-09-02 MED ORDER — REGADENOSON 0.4 MG/5ML IV SOLN
INTRAVENOUS | Status: AC
  Filled 2013-09-02: qty 5

## 2013-09-02 MED ORDER — ALPRAZOLAM 0.25 MG PO TABS
ORAL_TABLET | ORAL | Status: AC
  Filled 2013-09-02: qty 4

## 2013-09-02 MED ORDER — ACETAMINOPHEN 325 MG PO TABS: 650.0000 mg | ORAL_TABLET | ORAL | Status: DC | PRN

## 2013-09-02 MED ORDER — TECHNETIUM TC 99M SESTAMIBI GENERIC - CARDIOLITE
10.0000 | Freq: Once | INTRAVENOUS | Status: AC | PRN
  Administered 2013-09-02: 10 via INTRAVENOUS

## 2013-09-02 MED ORDER — TECHNETIUM TC 99M TETROFOSMIN IV KIT
30.0000 | PACK | Freq: Once | INTRAVENOUS | Status: AC | PRN
  Administered 2013-09-02: 30 via INTRAVENOUS

## 2013-09-02 NOTE — Progress Notes (Signed)
Patient ID: Adrienne Bennett, female   DOB: [birth year], 53 y.o.   MRN: [id number]    Subjective:  No pain having cardiolite study   Objective:  Filed Vitals:   09/01/13 1110 09/01/13 1304 09/01/13 2048 09/02/13 0520  BP: 168/89 150/77 177/99 151/85  Pulse: 80 82 88 78  Temp: 98.2 F (36.8 C) 98 F (36.7 C) 98.3 F (36.8 C) 98.2 F (36.8 C)  TempSrc: Oral Oral Oral Oral  Resp: 18 18 18 18   Height:    5\' 1"  (1.549 m)  Weight:    183 lb 4.8 oz (83.144 kg)  SpO2: 98% 99% 95% 92%     Physical Exam: Affect appropriate Healthy:  appears stated age HEENT: normal Neck supple with no adenopathy JVP normal no bruits no thyromegaly Lungs clear with no wheezing and good diaphragmatic motion Heart:  S1/S2 no murmur, no rub, gallop or click PMI normal Abdomen: benighn, BS positve, no tenderness, no AAA no bruit.  No HSM or HJR Distal pulses intact with no bruits No edema Neuro non-focal Skin warm and dry No muscular weakness   Lab Results: Basic Metabolic Panel:  Recent Labs  14/78/29 1100 09/02/13 0404  NA 138 139  K 4.3 4.1  CL 96 98  CO2 29 29  GLUCOSE 157* 138*  BUN 7 10  CREATININE 0.40* 0.41*  CALCIUM 9.5 8.2*   Liver Function Tests:  Recent Labs  09/01/13 1100  AST 36  ALT 25  ALKPHOS 67  BILITOT 0.3  PROT 7.4  ALBUMIN 3.4*   CBC:  Recent Labs  09/01/13 1100 09/02/13 0404  WBC 7.8 7.5  NEUTROABS 5.0  --   HGB 14.1 13.2  HCT 42.5 39.8  MCV 95.1 94.3  PLT 232 210   Cardiac Enzymes:  Recent Labs  09/01/13 1633 09/01/13 2220 09/02/13 0404  TROPONINI <0.30 <0.30 <0.30   Hemoglobin A1C:  Recent Labs  09/01/13 1700  HGBA1C 7.3*   Fasting Lipid Panel:  Recent Labs  09/02/13 0404  CHOL 126  HDL 36*  LDLCALC 60  TRIG 562*  CHOLHDL 3.5   Thyroid Function Tests:  Recent Labs  09/01/13 1700  TSH 1.377    Imaging: Dg Chest 2 View  09/01/2013   CLINICAL DATA:  Severe chest pain  EXAM: CHEST  2 VIEW  COMPARISON:  None.   FINDINGS: The lungs are well-expanded. The cardiac silhouette is top-normal in size. The central pulmonary vascularity is prominent. There is no significant pleural effusion and no evidence of a pneumothorax. There is moderate mid thoracic dextroscoliosis which distorts the anatomy.  IMPRESSION: There is mild prominence of the central pulmonary vascularity which in the appropriate clinical setting could reflect low grade CHF. There is no interstitial or alveolar edema nor pleural effusion. There is no pneumothorax.   Electronically Signed   By: David  [place]   On: 09/01/2013 12:34    Cardiac Studies:  ECG:   SR rate 77 normal    Telemetry:  NSR no arrhythmia   Echo:   Medications:   . ALPRAZolam  1 mg Oral BID  . aspirin EC  81 mg Oral Daily  . buPROPion  300 mg Oral Daily  . divalproex  500 mg Oral BID  . insulin aspart  0-9 Units Subcutaneous TID WC  . linagliptin  5 mg Oral Daily  . lisinopril  40 mg Oral Daily  . oxybutynin  5 mg Oral Daily  . propranolol  20 mg Oral BID  .  regadenoson  0.4 mg Intravenous Once  . risperiDONE  1 mg Oral BID  . simvastatin  40 mg Oral Daily     . heparin 1,250 Units/hr (09/02/13 [id number])    Assessment/Plan:  Chest Pain:  R/o no acute ECG change See note form Dr Landis Martinsurner  Cardiolite being done and echo pending  If both studies done today And normal could d/c but suspect it will be hard for both tests to be done DM:  Discussed low carb diet.  Target hemoglobin A1c is 6.5 or less.  Continue current medications. Chol:  Continue statin    Charlton Hawseter Malaika Arnall 09/02/2013, 9:15 AM

## 2013-09-02 NOTE — Discharge Summary (Signed)
Patient ID: Adrienne Bennett,  MRN: [id number], DOB/AGE: Apr 26, 1961 53 y.o.  Admit date: 09/01/2013 Discharge date: 09/02/2013  Primary Care Provider: Dr Kathlene November Primary Cardiologist:   Discharge Diagnoses Principal Problem:   Unstable angina Active Problems:   DIABETES MELLITUS, TYPE II   Bipolar affective disorder   IBS ?   HYPERTENSION    Procedures: Echo 09/01/13. Myoview 09/02/13   Hospital Course:  The patient is a 53 yo obese caucasian female with a history of depression, bipolar affective DO, IBS, HTN, HLD, DMII, chronic diarrhea. She has no prior cardiac history but she dos have a significant family history of MI in her father and brother at young ages. She developed "massive, crushing" CP 09/01/13 while listening to music in the kitchen. She says she couldn't move both arms, talk, or breath for about 5 mins. She drove herself to the ER. The pain resolved prior to getting there. She was admitted for further evaluation. Troponin was negative x 3. Echo and Lexiscan Myoview were normal. She was discharged late on the 7th and can follow up with her PMD.    Discharge Vitals:  Blood pressure 150/82, pulse 78, temperature 98 F (36.7 C), temperature source Oral, resp. rate 20, height 5' 1"  (1.549 m), weight 183 lb 4.8 oz (83.144 kg), last menstrual period 02/14/2013, SpO2 92.00%.    Labs: Results for orders placed during the hospital encounter of 09/01/13 (from the past 48 hour(s))  CBC WITH DIFFERENTIAL     Status: None   Collection Time    09/01/13 11:00 AM      Result Value Range   WBC 7.8  4.0 - 10.5 K/uL   RBC 4.47  3.87 - 5.11 MIL/uL   Hemoglobin 14.1  12.0 - 15.0 g/dL   HCT 42.5  36.0 - 46.0 %   MCV 95.1  78.0 - 100.0 fL   MCH 31.5  26.0 - 34.0 pg   MCHC 33.2  30.0 - 36.0 g/dL   RDW 12.9  11.5 - 15.5 %   Platelets 232  150 - 400 K/uL   Neutrophils Relative % 65  43 - 77 %   Neutro Abs 5.0  1.7 - 7.7 K/uL   Lymphocytes Relative 25  12 - 46 %   Lymphs Abs 1.9  0.7 - 4.0  K/uL   Monocytes Relative 9  3 - 12 %   Monocytes Absolute 0.7  0.1 - 1.0 K/uL   Eosinophils Relative 2  0 - 5 %   Eosinophils Absolute 0.1  0.0 - 0.7 K/uL   Basophils Relative 0  0 - 1 %   Basophils Absolute 0.0  0.0 - 0.1 K/uL  COMPREHENSIVE METABOLIC PANEL     Status: Abnormal   Collection Time    09/01/13 11:00 AM      Result Value Range   Sodium 138  137 - 147 mEq/L   Potassium 4.3  3.7 - 5.3 mEq/L   Chloride 96  96 - 112 mEq/L   CO2 29  19 - 32 mEq/L   Glucose, Bld 157 (*) 70 - 99 mg/dL   BUN 7  6 - 23 mg/dL   Creatinine, Ser 0.40 (*) 0.50 - 1.10 mg/dL   Calcium 9.5  8.4 - 10.5 mg/dL   Total Protein 7.4  6.0 - 8.3 g/dL   Albumin 3.4 (*) 3.5 - 5.2 g/dL   AST 36  0 - 37 U/L   ALT 25  0 - 35 U/L  Alkaline Phosphatase 67  39 - 117 U/L   Total Bilirubin 0.3  0.3 - 1.2 mg/dL   GFR calc non Af Amer >90  >90 mL/min   GFR calc Af Amer >90  >90 mL/min   Comment: (NOTE)     The eGFR has been calculated using the CKD EPI equation.     This calculation has not been validated in all clinical situations.     eGFR's persistently <90 mL/min signify possible Chronic Kidney     Disease.  TROPONIN I     Status: None   Collection Time    09/01/13 11:00 AM      Result Value Range   Troponin I <0.30  <0.30 ng/mL   Comment:            Due to the release kinetics of cTnI,     a negative result within the first hours     of the onset of symptoms does not rule out     myocardial infarction with certainty.     If myocardial infarction is still suspected,     repeat the test at appropriate intervals.  URINALYSIS, ROUTINE W REFLEX MICROSCOPIC     Status: Abnormal   Collection Time    09/01/13 11:28 AM      Result Value Range   Color, Urine YELLOW  YELLOW   APPearance CLEAR  CLEAR   Specific Gravity, Urine 1.005  1.005 - 1.030   pH 7.5  5.0 - 8.0   Glucose, UA NEGATIVE  NEGATIVE mg/dL   Hgb urine dipstick NEGATIVE  NEGATIVE   Bilirubin Urine NEGATIVE  NEGATIVE   Ketones, ur NEGATIVE   NEGATIVE mg/dL   Protein, ur 30 (*) NEGATIVE mg/dL   Urobilinogen, UA 0.2  0.0 - 1.0 mg/dL   Nitrite NEGATIVE  NEGATIVE   Leukocytes, UA NEGATIVE  NEGATIVE  URINE MICROSCOPIC-ADD ON     Status: Abnormal   Collection Time    09/01/13 11:28 AM      Result Value Range   Squamous Epithelial / LPF FEW (*) RARE   WBC, UA 0-2  <3 WBC/hpf   RBC / HPF 0-2  <3 RBC/hpf   Bacteria, UA FEW (*) RARE  TROPONIN I     Status: None   Collection Time    09/01/13  4:33 PM      Result Value Range   Troponin I <0.30  <0.30 ng/mL   Comment:            Due to the release kinetics of cTnI,     a negative result within the first hours     of the onset of symptoms does not rule out     myocardial infarction with certainty.     If myocardial infarction is still suspected,     repeat the test at appropriate intervals.  TSH     Status: None   Collection Time    09/01/13  5:00 PM      Result Value Range   TSH 1.377  0.350 - 4.500 uIU/mL   Comment: Performed at [facility]     Status: Abnormal   Collection Time    09/01/13  5:00 PM      Result Value Range   Hemoglobin A1C 7.3 (*) <5.7 %   Comment: (NOTE)  According to the ADA Clinical Practice Recommendations for 2011, when     HbA1c is used as a screening test:      >=6.5%   Diagnostic of Diabetes Mellitus               (if abnormal result is confirmed)     5.7-6.4%   Increased risk of developing Diabetes Mellitus     References:Diagnosis and Classification of Diabetes Mellitus,Diabetes     ASNK,5397,67(HALPF 1):S62-S69 and Standards of Medical Care in             Diabetes - 2011,Diabetes XTKW,4097,35 (Suppl 1):S11-S61.   Mean Plasma Glucose 163 (*) <117 mg/dL   Comment: Performed at [place], [facility]     Status: Abnormal   Collection Time    09/01/13  8:45 PM      Result Value Range   Glucose-Capillary 159 (*) 70 - 99  mg/dL   Comment 1 Notify RN    TROPONIN I     Status: None   Collection Time    09/01/13 10:20 PM      Result Value Range   Troponin I <0.30  <0.30 ng/mL   Comment:            Due to the release kinetics of cTnI,     a negative result within the first hours     of the onset of symptoms does not rule out     myocardial infarction with certainty.     If myocardial infarction is still suspected,     repeat the test at appropriate intervals.  HEPARIN LEVEL (UNFRACTIONATED)     Status: Abnormal   Collection Time    09/01/13 10:27 PM      Result Value Range   Heparin Unfractionated <0.10 (*) 0.30 - 0.70 IU/mL   Comment:            IF HEPARIN RESULTS ARE BELOW     EXPECTED VALUES, AND PATIENT     DOSAGE HAS BEEN CONFIRMED,     SUGGEST FOLLOW UP TESTING     OF ANTITHROMBIN III LEVELS.  TROPONIN I     Status: None   Collection Time    09/02/13  4:04 AM      Result Value Range   Troponin I <0.30  <0.30 ng/mL   Comment:            Due to the release kinetics of cTnI,     a negative result within the first hours     of the onset of symptoms does not rule out     myocardial infarction with certainty.     If myocardial infarction is still suspected,     repeat the test at appropriate intervals.  LIPID PANEL     Status: Abnormal   Collection Time    09/02/13  4:04 AM      Result Value Range   Cholesterol 126  0 - 200 mg/dL   Triglycerides 151 (*) <150 mg/dL   HDL 36 (*) >39 mg/dL   Total CHOL/HDL Ratio 3.5     VLDL 30  0 - 40 mg/dL   LDL Cholesterol 60  0 - 99 mg/dL   Comment:            Total Cholesterol/HDL:CHD Risk     Coronary Heart Disease Risk Table  Men   Women      1/2 Average Risk   3.4   3.3      Average Risk       5.0   4.4      2 X Average Risk   9.6   7.1      3 X Average Risk  23.4   11.0                Use the calculated Patient Ratio     above and the CHD Risk Table     to determine the patient's CHD Risk.                ATP III  CLASSIFICATION (LDL):      <100     mg/dL   Optimal      100-129  mg/dL   Near or Above                        Optimal      130-159  mg/dL   Borderline      160-189  mg/dL   High      >190     mg/dL   Very High  BASIC METABOLIC PANEL     Status: Abnormal   Collection Time    09/02/13  4:04 AM      Result Value Range   Sodium 139  137 - 147 mEq/L   Potassium 4.1  3.7 - 5.3 mEq/L   Chloride 98  96 - 112 mEq/L   CO2 29  19 - 32 mEq/L   Glucose, Bld 138 (*) 70 - 99 mg/dL   BUN 10  6 - 23 mg/dL   Creatinine, Ser 0.41 (*) 0.50 - 1.10 mg/dL   Calcium 8.2 (*) 8.4 - 10.5 mg/dL   GFR calc non Af Amer >90  >90 mL/min   GFR calc Af Amer >90  >90 mL/min   Comment: (NOTE)     The eGFR has been calculated using the CKD EPI equation.     This calculation has not been validated in all clinical situations.     eGFR's persistently <90 mL/min signify possible Chronic Kidney     Disease.  HEPARIN LEVEL (UNFRACTIONATED)     Status: None   Collection Time    09/02/13  4:04 AM      Result Value Range   Heparin Unfractionated 0.53  0.30 - 0.70 IU/mL   Comment:            IF HEPARIN RESULTS ARE BELOW     EXPECTED VALUES, AND PATIENT     DOSAGE HAS BEEN CONFIRMED,     SUGGEST FOLLOW UP TESTING     OF ANTITHROMBIN III LEVELS.  CBC     Status: None   Collection Time    09/02/13  4:04 AM      Result Value Range   WBC 7.5  4.0 - 10.5 K/uL   RBC 4.22  3.87 - 5.11 MIL/uL   Hemoglobin 13.2  12.0 - 15.0 g/dL   HCT 39.8  36.0 - 46.0 %   MCV 94.3  78.0 - 100.0 fL   MCH 31.3  26.0 - 34.0 pg   MCHC 33.2  30.0 - 36.0 g/dL   RDW 13.3  11.5 - 15.5 %   Platelets 210  150 - 400 K/uL  GLUCOSE, CAPILLARY     Status: Abnormal   Collection Time    09/02/13  7:49 AM      Result Value Range   Glucose-Capillary 120 (*) 70 - 99 mg/dL  GLUCOSE, CAPILLARY     Status: Abnormal   Collection Time    09/02/13 11:59 AM      Result Value Range   Glucose-Capillary 143 (*) 70 - 99 mg/dL   Comment 1 Notify RN     HEPARIN LEVEL (UNFRACTIONATED)     Status: Abnormal   Collection Time    09/02/13 12:20 PM      Result Value Range   Heparin Unfractionated 0.15 (*) 0.30 - 0.70 IU/mL   Comment:            IF HEPARIN RESULTS ARE BELOW     EXPECTED VALUES, AND PATIENT     DOSAGE HAS BEEN CONFIRMED,     SUGGEST FOLLOW UP TESTING     OF ANTITHROMBIN III LEVELS.    Disposition:  Follow-up Information   Follow up with Kathlene November, MD In 2 weeks. (call office for follow up)    Specialty:  Internal Medicine   Contact information:   [phone] [address] [employer] [address] [place] [address] [phone]       Discharge Medications:    Medication List    STOP taking these medications       divalproex 250 MG DR tablet  Commonly known as:  DEPAKOTE      TAKE these medications       acetaminophen 325 MG tablet  Commonly known as:  TYLENOL  Take 2 tablets (650 mg total) by mouth every 4 (four) hours as needed for headache or mild pain.     ALPRAZolam 1 MG tablet  Commonly known as:  XANAX  Take 1-2 mg by mouth 2 (two) times daily. Takes 65m in the morning and 273min the evening     buPROPion 150 MG 24 hr tablet  Commonly known as:  WELLBUTRIN XL  Take 300 mg by mouth every morning.     clotrimazole-betamethasone cream  Commonly known as:  LOTRISONE  Apply 1 application topically 2 (two) times daily as needed (for rash).     cyclobenzaprine 10 MG tablet  Commonly known as:  FLEXERIL  Take 10 mg by mouth 2 (two) times daily as needed for muscle spasms.     dicyclomine 20 MG tablet  Commonly known as:  BENTYL  Take 20 mg by mouth 3 (three) times daily as needed for spasms.     lisinopril 40 MG tablet  Commonly known as:  PRINIVIL,ZESTRIL  Take 40 mg by mouth daily.     meloxicam 7.5 MG tablet  Commonly known as:  MOBIC  Take 7.5 mg by mouth daily.     metFORMIN 500 MG tablet  Commonly known as:  GLUCOPHAGE  Take 1,000 mg by mouth 2 (two) times daily with a meal.      oxybutynin 5 MG 24 hr tablet  Commonly known as:  DITROPAN-XL  Take 5 mg by mouth daily.     propranolol 10 MG tablet  Commonly known as:  INDERAL  Take 20 mg by mouth 2 (two) times daily.     risperiDONE 1 MG tablet  Commonly known as:  RISPERDAL  Take 2 mg by mouth at bedtime.     simvastatin 40 MG tablet  Commonly known as:  ZOCOR  Take 40 mg by mouth daily.     sitaGLIPtin 100 MG tablet  Commonly known as:  JANUVIA  Take 100  mg by mouth daily.         Duration of Discharge Encounter: Greater than 30 minutes including physician time.  Angelena Form PA-C 09/02/2013 2:40 PM

## 2013-09-02 NOTE — Progress Notes (Signed)
ANTICOAGULATION CONSULT NOTE - Follow Up Consult  Pharmacy Consult for Heparin  Indication: chest pain/ACS  Allergies  Allergen Reactions  . Benztropine   . Penicillins    Patient Measurements: Heparin Dosing Weight: ~70 kg  Vital Signs: Temp: 98.2 F (36.8 C) (02/07 0520) Temp src: Oral (02/07 0520) BP: 152/75 mmHg (02/07 1112) Pulse Rate: 84 (02/07 1112)  Labs:  Recent Labs  09/01/13 1100 09/01/13 1633 09/01/13 2220 09/01/13 2227 09/02/13 0404 09/02/13 1220  HGB 14.1  --   --   --  13.2  --   HCT 42.5  --   --   --  39.8  --   PLT 232  --   --   --  210  --   HEPARINUNFRC  --   --   --  <0.10* 0.53 0.15*  CREATININE 0.40*  --   --   --  0.41*  --   TROPONINI <0.30 <0.30 <0.30  --  <0.30  --    Estimated Creatinine Clearance: 79.5 ml/min (by C-G formula based on Cr of 0.41).  Medications:  Heparin 1000 units/hr  Assessment: 53 y/o F on heparin for CP. HL was at goal this morning and is now down again this afternoon. Spoke with nurse who did not report any problems with the infusion line or bleeding. Will titrate heparin infusion and recheck level tonight.    Goal of Therapy:  Heparin level 0.3-0.7 units/ml Monitor platelets by anticoagulation protocol: Yes   Plan:  Increase heparin to 1500 units/hr Recheck heparin level tonight at [employer]  Kirstin Kugler, Dorena CookeyFrank Rhea 09/02/2013,1:19 PM

## 2013-09-02 NOTE — Discharge Instructions (Signed)
Chest Pain (Nonspecific) °It is often hard to give a specific diagnosis for the cause of chest pain. There is always a chance that your pain could be related to something serious, such as a heart attack or a blood clot in the lungs. You need to follow up with your caregiver for further evaluation. °CAUSES  °· Heartburn. °· Pneumonia or bronchitis. °· Anxiety or stress. °· Inflammation around your heart (pericarditis) or lung (pleuritis or pleurisy). °· A blood clot in the lung. °· A collapsed lung (pneumothorax). It can develop suddenly on its own (spontaneous pneumothorax) or from injury (trauma) to the chest. °· Shingles infection (herpes zoster virus). °The chest wall is composed of bones, muscles, and cartilage. Any of these can be the source of the pain. °· The bones can be bruised by injury. °· The muscles or cartilage can be strained by coughing or overwork. °· The cartilage can be affected by inflammation and become sore (costochondritis). °DIAGNOSIS  °Lab tests or other studies, such as X-rays, electrocardiography, stress testing, or cardiac imaging, may be needed to find the cause of your pain.  °TREATMENT  °· Treatment depends on what may be causing your chest pain. Treatment may include: °· Acid blockers for heartburn. °· Anti-inflammatory medicine. °· Pain medicine for inflammatory conditions. °· Antibiotics if an infection is present. °· You may be advised to change lifestyle habits. This includes stopping smoking and avoiding alcohol, caffeine, and chocolate. °· You may be advised to keep your head raised (elevated) when sleeping. This reduces the chance of acid going backward from your stomach into your esophagus. °· Most of the time, nonspecific chest pain will improve within 2 to 3 days with rest and mild pain medicine. °HOME CARE INSTRUCTIONS  °· If antibiotics were prescribed, take your antibiotics as directed. Finish them even if you start to feel better. °· For the next few days, avoid physical  activities that bring on chest pain. Continue physical activities as directed. °· Do not smoke. °· Avoid drinking alcohol. °· Only take over-the-counter or prescription medicine for pain, discomfort, or fever as directed by your caregiver. °· Follow your caregiver's suggestions for further testing if your chest pain does not go away. °· Keep any follow-up appointments you made. If you do not go to an appointment, you could develop lasting (chronic) problems with pain. If there is any problem keeping an appointment, you must call to reschedule. °SEEK MEDICAL CARE IF:  °· You think you are having problems from the medicine you are taking. Read your medicine instructions carefully. °· Your chest pain does not go away, even after treatment. °· You develop a rash with blisters on your chest. °SEEK IMMEDIATE MEDICAL CARE IF:  °· You have increased chest pain or pain that spreads to your arm, neck, jaw, back, or abdomen. °· You develop shortness of breath, an increasing cough, or you are coughing up blood. °· You have severe back or abdominal pain, feel nauseous, or vomit. °· You develop severe weakness, fainting, or chills. °· You have a fever. °THIS IS AN EMERGENCY. Do not wait to see if the pain will go away. Get medical help at once. Call your local emergency services (911 in [place] Do not drive yourself to the hospital. °MAKE SURE YOU:  °· Understand these instructions. °· Will watch your condition. °· Will get help right away if you are not doing well or get worse. °Document Released: 04/22/2005 Document Revised: 10/05/2011 Document Reviewed: 02/16/2008 °ExitCare® Patient Information ©2014 [employer]   [employer]. ° °

## 2013-09-02 NOTE — Progress Notes (Signed)
Utilization Review completed.  

## 2013-09-06 ENCOUNTER — Other Ambulatory Visit: Payer: Self-pay | Admitting: *Deleted

## 2013-09-06 MED ORDER — MELOXICAM 7.5 MG PO TABS: 7.5000 mg | ORAL_TABLET | Freq: Every day | ORAL | Status: DC

## 2013-09-07 ENCOUNTER — Other Ambulatory Visit: Payer: Self-pay | Admitting: Internal Medicine

## 2013-09-08 ENCOUNTER — Ambulatory Visit (INDEPENDENT_AMBULATORY_CARE_PROVIDER_SITE_OTHER): Payer: 59 | Admitting: Internal Medicine

## 2013-09-08 ENCOUNTER — Encounter: Payer: Self-pay | Admitting: Internal Medicine

## 2013-09-08 ENCOUNTER — Other Ambulatory Visit: Payer: Self-pay | Admitting: Internal Medicine

## 2013-09-08 VITALS — BP 161/81 | HR 78 | Temp 98.3°F | Wt 181.0 lb

## 2013-09-08 DIAGNOSIS — I200 Unstable angina: Secondary | ICD-10-CM

## 2013-09-08 DIAGNOSIS — I10 Essential (primary) hypertension: Secondary | ICD-10-CM

## 2013-09-08 DIAGNOSIS — M549 Dorsalgia, unspecified: Secondary | ICD-10-CM

## 2013-09-08 DIAGNOSIS — E119 Type 2 diabetes mellitus without complications: Secondary | ICD-10-CM

## 2013-09-08 MED ORDER — CARVEDILOL 12.5 MG PO TABS: 12.5000 mg | ORAL_TABLET | Freq: Two times a day (BID) | ORAL | Status: DC

## 2013-09-08 MED ORDER — SITAGLIPTIN PHOSPHATE 100 MG PO TABS: ORAL_TABLET | ORAL | Status: DC

## 2013-09-08 MED ORDER — LISINOPRIL 40 MG PO TABS: 40.0000 mg | ORAL_TABLET | Freq: Every day | ORAL | Status: DC

## 2013-09-08 NOTE — Progress Notes (Signed)
Pre visit review using our clinic review tool, if applicable. No additional management support is needed unless otherwise documented below in the visit note. 

## 2013-09-08 NOTE — Assessment & Plan Note (Signed)
Continue to be well-controlled with with lower dose of  meloxicam (7.5 mg)
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
## 2013-09-08 NOTE — Assessment & Plan Note (Addendum)
Last month, lisinopril dose was increased to 40, meloxicam dose decreased. BP continue to be elevated. Recent BMP normal. Plan: Discontinue inderal, start carvedilol Continue monitoring BPs Return to the office in one month

## 2013-09-08 NOTE — Assessment & Plan Note (Addendum)
A1c was checked at the hospital a few days ago and was improving, currently on metformin 1000 mgs twice a day. Continue metformin and Januvia

## 2013-09-08 NOTE — Assessment & Plan Note (Signed)
Admitted w/  chest pain redently, workup negative, currently chest pain-free.

## 2013-09-08 NOTE — Patient Instructions (Signed)
Stop propranolol and start carvedilol twice a day All other medications are the same  Check the  blood pressure daily be sure it is between 110/60 and 140/85. Ideal blood pressure is 120/80. If it is consistently higher or lower, let me know  Next visit is for routine check up regards your  blood pressure in 4-6 weeks  No need to come back fasting Please make an appointment

## 2013-09-19 ENCOUNTER — Ambulatory Visit: Payer: Medicare Other | Admitting: *Deleted

## 2013-09-22 IMAGING — CR DG LUMBAR SPINE COMPLETE 4+V
5 series · 5 of 5 positions shown · non-contrast
Comparison: None.

CLINICAL DATA: Low back pain

LUMBAR SPINE - COMPLETE 4+ VIEW

[t l-spine a.p.]
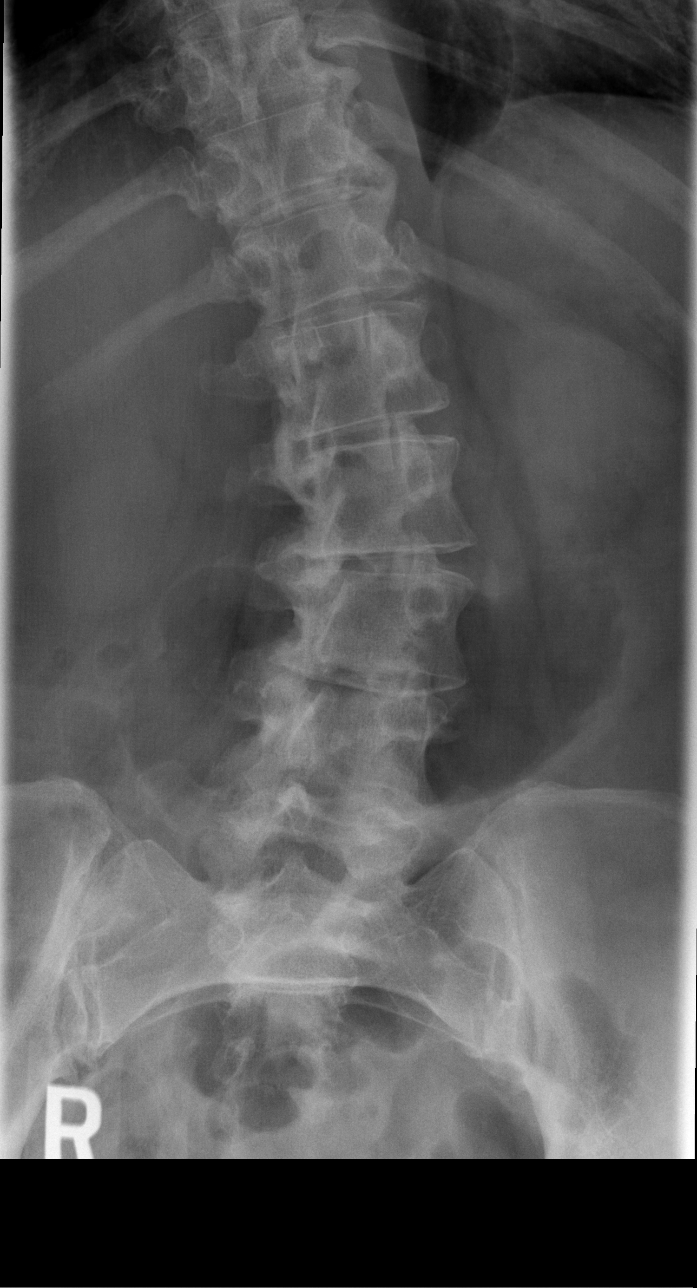

[t l-spine oblique exposure (1 of 2)]
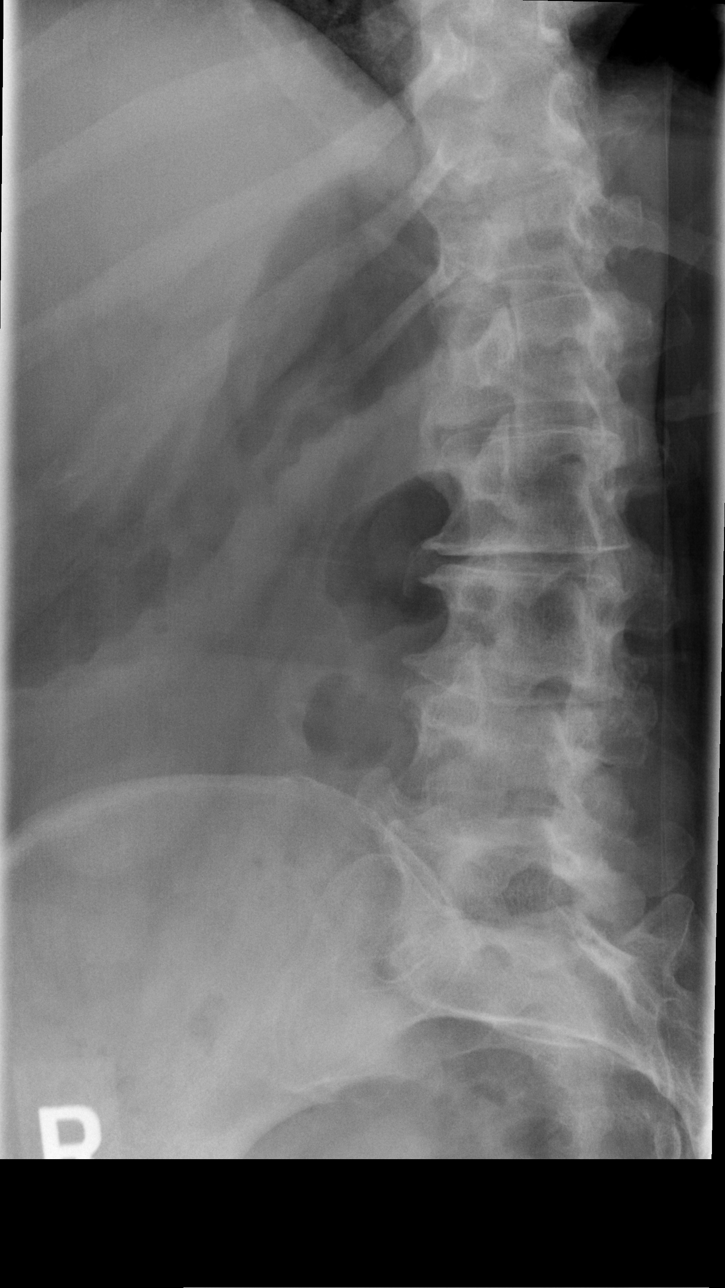

[t l-spine oblique exposure (2 of 2)]
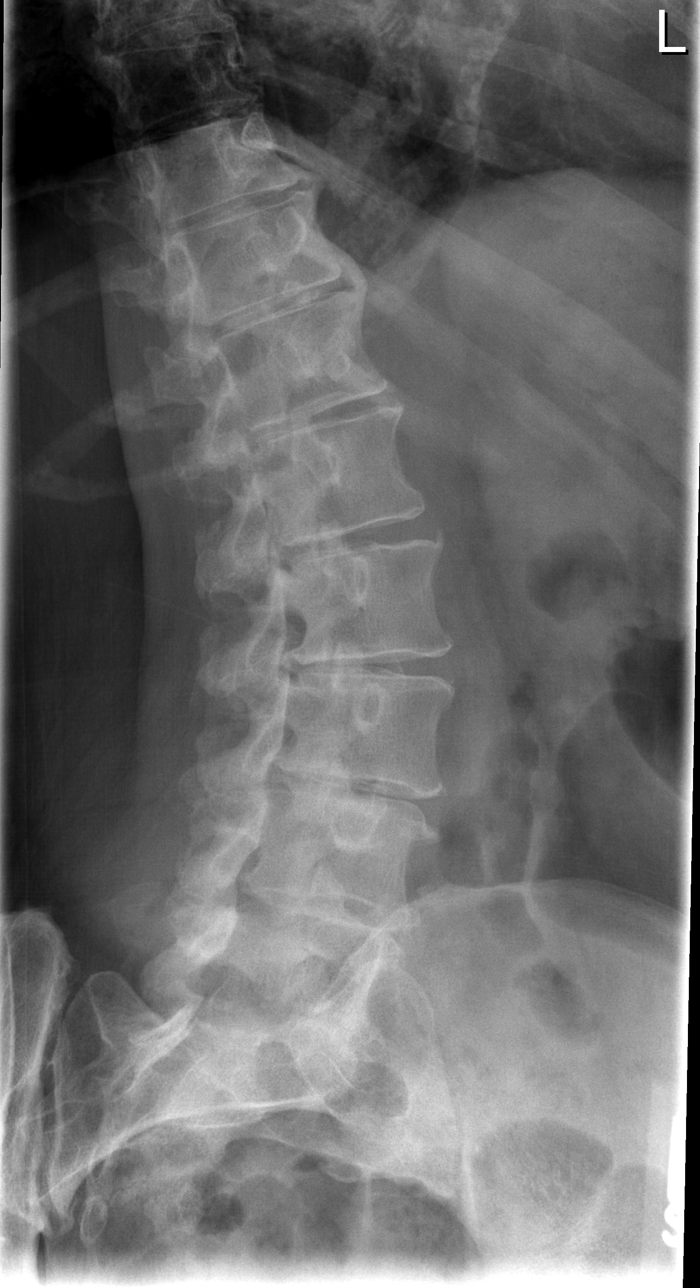

[t l-spine lat]
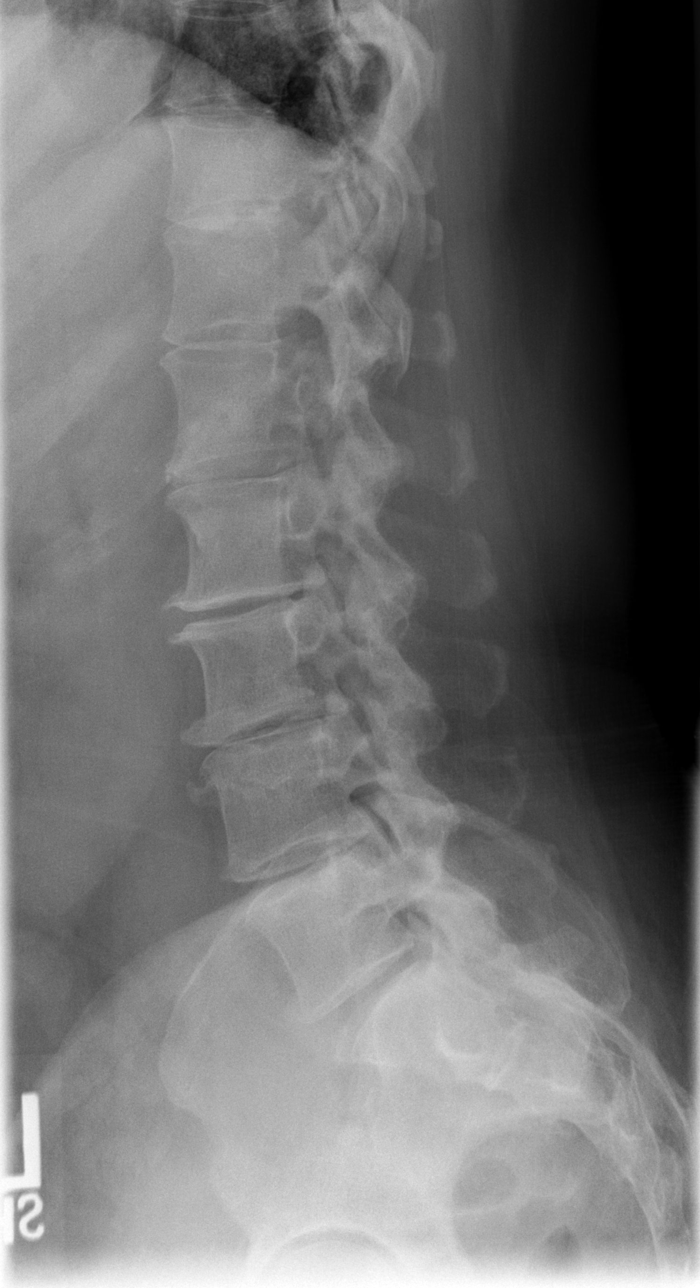

[t l-spine l5-s1 spot]
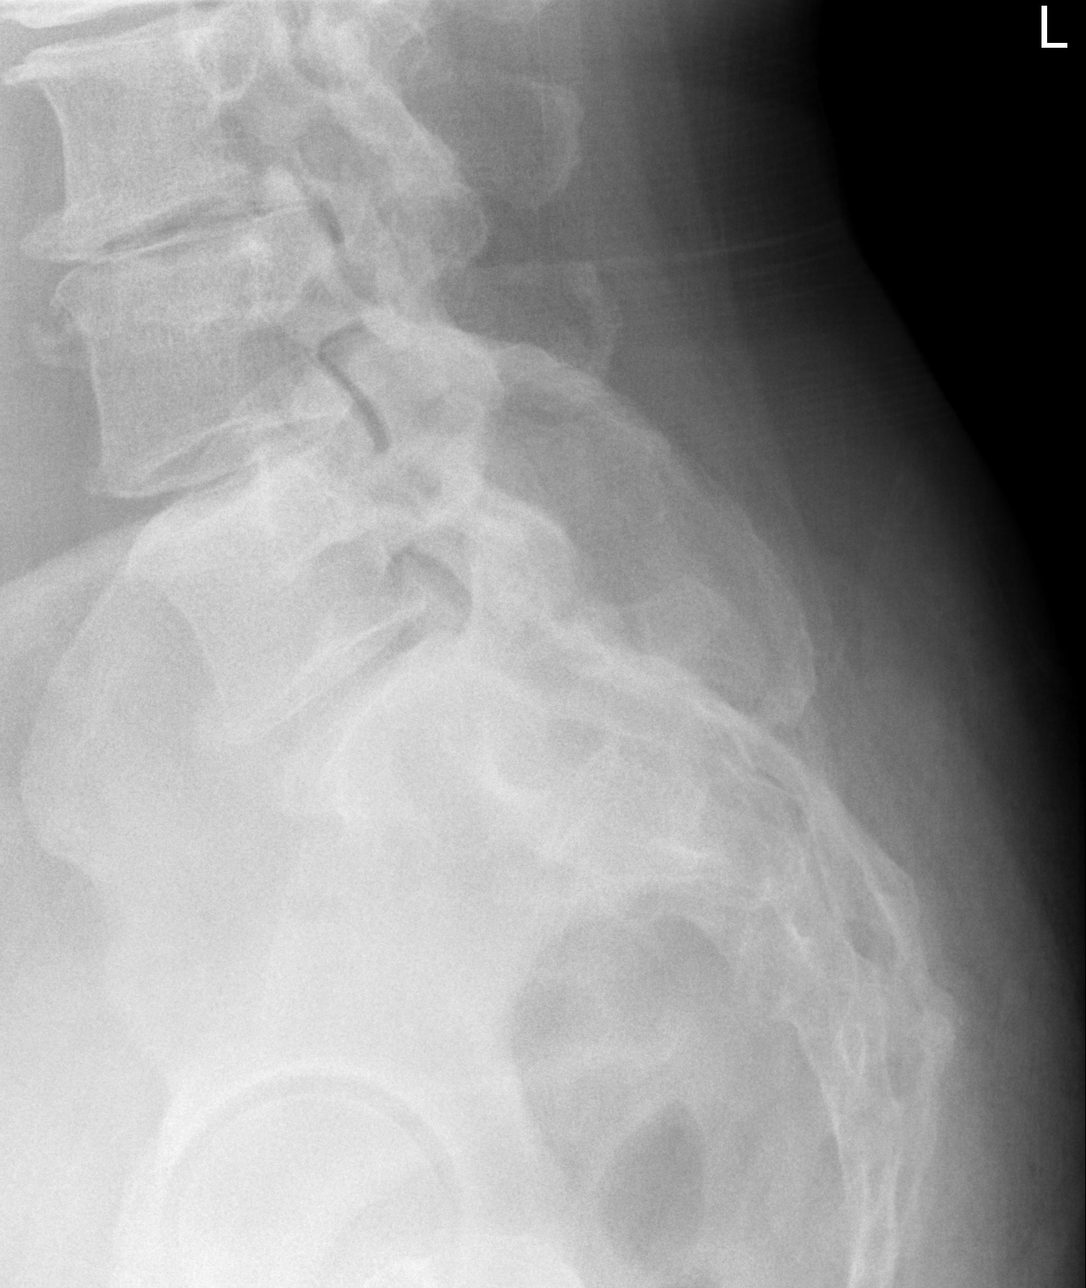

[5 of 5 positions shown; findings below may reference images not displayed]

FINDINGS: Moderate levoscoliosis of lumbar spine is noted. No
fracture or spondylolisthesis is noted.  Degenerative disc disease
is noted at L1-2, L2-3, L3-4 and L4-5.  Posterior facet joints
appear normal.
IMPRESSION: Multilevel degenerative disc disease. No acute abnormality seen in
the lumbar spine.

## 2013-09-25 ENCOUNTER — Telehealth: Payer: Self-pay | Admitting: Internal Medicine

## 2013-09-25 DIAGNOSIS — I10 Essential (primary) hypertension: Secondary | ICD-10-CM

## 2013-09-25 MED ORDER — AMLODIPINE BESYLATE 5 MG PO TABS: 5.0000 mg | ORAL_TABLET | Freq: Every day | ORAL | Status: DC

## 2013-09-25 NOTE — Telephone Encounter (Signed)
Patient returned your call.

## 2013-09-25 NOTE — Telephone Encounter (Signed)
Pt notified. Agrees with plan.

## 2013-09-25 NOTE — Telephone Encounter (Signed)
Advise pt, I saw her message regards carvedilol s/e. Plan: D/c carvedilol start amlodipine 5 mg 1 po qd  #30, 3 RF Call w/ BP readings in 4 weeks Call if s/e

## 2013-09-26 ENCOUNTER — Telehealth: Payer: Self-pay | Admitting: *Deleted

## 2013-09-26 ENCOUNTER — Other Ambulatory Visit: Payer: Self-pay | Admitting: *Deleted

## 2013-09-26 MED ORDER — OMEPRAZOLE 20 MG PO CPDR: 40.0000 mg | DELAYED_RELEASE_CAPSULE | Freq: Every day | ORAL | Status: DC

## 2013-09-26 MED ORDER — ATORVASTATIN CALCIUM 20 MG PO TABS
20.0000 mg | ORAL_TABLET | Freq: Every day | ORAL | Status: DC
Start: 2013-09-26 — End: 2013-10-24

## 2013-09-26 NOTE — Telephone Encounter (Signed)
Pt notified. Pt agrees and understands plan .

## 2013-09-26 NOTE — Telephone Encounter (Signed)
I called and spoke with pharmacist about the change from Simvastatin to lipitor and the pharmacist stated that Lipitor has the same interaction with Amlodipine. Please advise. JG//CMA    Also, the pharmacy sent me a note regards interactions between amlodipine & simvastatin.  Please call the pharmacy and the patient:  --Discontinue simvastatin 40 mg at the start Lipitor 20 mg one by mouth daily #30 and 3 refills  --Okay to take amlodipine

## 2013-09-26 NOTE — Telephone Encounter (Signed)
Lipitor e-scribed to pharmacy. JG//CMA 

## 2013-09-26 NOTE — Telephone Encounter (Signed)
Lipitor is okay , proceed with the RF

## 2013-09-26 NOTE — Telephone Encounter (Addendum)
1. I sent a prescription for omeprazole, to take 2 tablets before breakfast every day for 4 weeks and then stop and see how she's doing. If she is not improving --- needs to call us.  2.Also, the pharmacy sent me a note regards interactions between amlodipine &  simvastatin. Please call the pharmacy and the patient: --Discontinue simvastatin 40 mg at the start Lipitor 20 mg one by mouth daily #30 and 3 refills --Okay to take amlodipine

## 2013-09-26 NOTE — Telephone Encounter (Signed)
Patient called and stated that she is having acid reflux and its preventing her from taking her medications on a daily basis. Please advise. JG//CMA

## 2013-09-28 ENCOUNTER — Encounter: Payer: Self-pay | Admitting: Internal Medicine

## 2013-09-28 ENCOUNTER — Other Ambulatory Visit: Payer: Self-pay | Admitting: Internal Medicine

## 2013-09-28 MED ORDER — METFORMIN HCL 500 MG/5ML PO SOLN: 10.0000 mL | Freq: Two times a day (BID) | ORAL | Status: DC

## 2013-09-30 ENCOUNTER — Encounter: Payer: Self-pay | Admitting: Internal Medicine

## 2013-10-16 ENCOUNTER — Encounter: Payer: Self-pay | Admitting: Internal Medicine

## 2013-10-16 ENCOUNTER — Ambulatory Visit (INDEPENDENT_AMBULATORY_CARE_PROVIDER_SITE_OTHER): Payer: 59 | Admitting: Internal Medicine

## 2013-10-16 VITALS — BP 149/79 | HR 88 | Temp 98.0°F | Wt 185.2 lb

## 2013-10-16 DIAGNOSIS — E119 Type 2 diabetes mellitus without complications: Secondary | ICD-10-CM

## 2013-10-16 DIAGNOSIS — E785 Hyperlipidemia, unspecified: Secondary | ICD-10-CM

## 2013-10-16 DIAGNOSIS — I10 Essential (primary) hypertension: Secondary | ICD-10-CM

## 2013-10-16 MED ORDER — AMLODIPINE BESYLATE 10 MG PO TABS: 10.0000 mg | ORAL_TABLET | Freq: Every day | ORAL | Status: DC

## 2013-10-16 NOTE — Assessment & Plan Note (Signed)
Had some challenges swallowing metformin, see patient's emails; currently taking metformin half tablet at a time with relatively good results

## 2013-10-16 NOTE — Progress Notes (Signed)
Pre visit review using our clinic review tool, if applicable. No additional management support is needed unless otherwise documented below in the visit note. 
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
## 2013-10-16 NOTE — Patient Instructions (Signed)
Continue taking all the medications as you are doing except amlodipine ----> take 10 mg daily (Okay to take amlodipine 5 mg:  2 tablets together until you run out of the 5 mg tablets)   Check the  blood pressure 2 or 3 times a   week be sure it is between 110/60 and 140/85. Ideal blood pressure is 120/80. If it is consistently higher or lower, let me know  Next visit is for routine check up regards your blood sugar , blood pressure   in 2 months ,  fasting Please make an appointment

## 2013-10-16 NOTE — Assessment & Plan Note (Addendum)
Was switch from simvastatin to Lipitor because she started a calcium channel blocker. Good compliance and tolerance so far

## 2013-10-16 NOTE — Assessment & Plan Note (Signed)
Since the last time she was here, was intolerant to carvedilol, was switch to amlodipine 5 mg on 09-25-13. Ambulatory BPs are still elevated, fortunately she has not developed edema. BP may take a few weeks before it improves on amlodipine. Plan:  Increase amlodipine to 10 mg, continue with other meds. See instructions

## 2013-10-18 ENCOUNTER — Telehealth: Payer: Self-pay

## 2013-10-18 NOTE — Telephone Encounter (Signed)
Patient called to ask if it is ok to take the BP in the morning. Advised that it would be ok.

## 2013-10-21 ENCOUNTER — Encounter: Payer: Self-pay | Admitting: Internal Medicine

## 2013-10-24 ENCOUNTER — Encounter: Payer: Self-pay | Admitting: Internal Medicine

## 2013-10-24 ENCOUNTER — Other Ambulatory Visit: Payer: Self-pay | Admitting: *Deleted

## 2013-10-24 MED ORDER — ATORVASTATIN CALCIUM 20 MG PO TABS: 20.0000 mg | ORAL_TABLET | Freq: Every day | ORAL | Status: DC

## 2013-10-24 MED ORDER — OMEPRAZOLE 20 MG PO CPDR: 40.0000 mg | DELAYED_RELEASE_CAPSULE | Freq: Every day | ORAL | Status: DC

## 2013-10-30 ENCOUNTER — Other Ambulatory Visit: Payer: Self-pay | Admitting: *Deleted

## 2013-10-30 ENCOUNTER — Encounter: Payer: Self-pay | Admitting: Internal Medicine

## 2013-10-30 MED ORDER — CLOTRIMAZOLE-BETAMETHASONE 1-0.05 % EX CREA: 1.0000 "application " | TOPICAL_CREAM | Freq: Two times a day (BID) | CUTANEOUS | Status: DC | PRN

## 2013-10-30 MED ORDER — CYCLOBENZAPRINE HCL 10 MG PO TABS: 10.0000 mg | ORAL_TABLET | Freq: Two times a day (BID) | ORAL | Status: DC | PRN

## 2013-11-02 ENCOUNTER — Encounter: Payer: Self-pay | Admitting: Internal Medicine

## 2013-11-20 ENCOUNTER — Other Ambulatory Visit: Payer: Self-pay | Admitting: *Deleted

## 2013-11-20 MED ORDER — AMLODIPINE BESYLATE 10 MG PO TABS: 10.0000 mg | ORAL_TABLET | Freq: Every day | ORAL | Status: DC

## 2013-12-03 ENCOUNTER — Other Ambulatory Visit: Payer: Self-pay | Admitting: Internal Medicine

## 2013-12-05 ENCOUNTER — Other Ambulatory Visit: Payer: Self-pay | Admitting: Internal Medicine

## 2013-12-07 NOTE — Telephone Encounter (Signed)
Rx sent to the pharmacy by e-script.//AB/CMA 

## 2013-12-14 ENCOUNTER — Encounter: Payer: Self-pay | Admitting: Internal Medicine

## 2013-12-20 ENCOUNTER — Ambulatory Visit: Payer: 59 | Admitting: Internal Medicine

## 2013-12-22 ENCOUNTER — Ambulatory Visit: Payer: 59 | Admitting: Internal Medicine

## 2013-12-25 ENCOUNTER — Ambulatory Visit (INDEPENDENT_AMBULATORY_CARE_PROVIDER_SITE_OTHER): Payer: 59 | Admitting: Internal Medicine

## 2013-12-25 ENCOUNTER — Encounter: Payer: Self-pay | Admitting: Internal Medicine

## 2013-12-25 VITALS — BP 149/82 | HR 100 | Temp 98.0°F | Wt 181.0 lb

## 2013-12-25 DIAGNOSIS — E119 Type 2 diabetes mellitus without complications: Secondary | ICD-10-CM

## 2013-12-25 DIAGNOSIS — E785 Hyperlipidemia, unspecified: Secondary | ICD-10-CM

## 2013-12-25 DIAGNOSIS — I10 Essential (primary) hypertension: Secondary | ICD-10-CM

## 2013-12-25 LAB — HM MAMMOGRAPHY: HM Mammogram: NORMAL

## 2013-12-25 LAB — HEMOGLOBIN A1C: Hgb A1c MFr Bld: 9.1 % — ABNORMAL HIGH (ref 4.6–6.5)

## 2013-12-25 LAB — ALT: ALT: 18 U/L (ref 0–35)

## 2013-12-25 LAB — AST: AST: 37 U/L (ref 0–37)

## 2013-12-25 LAB — LIPID PANEL
CHOL/HDL RATIO: 3
Cholesterol: 106 mg/dL (ref 0–200)
HDL: 34.3 mg/dL — ABNORMAL LOW (ref >39.00)
LDL Cholesterol: 49 mg/dL (ref 0–99)
TRIGLYCERIDES: 116 mg/dL (ref 0.0–149.0)
VLDL: 23.2 mg/dL (ref 0.0–40.0)

## 2013-12-25 LAB — HM PAP SMEAR: HM PAP: NORMAL

## 2013-12-25 MED ORDER — PROPRANOLOL HCL 10 MG PO TABS: 20.0000 mg | ORAL_TABLET | Freq: Two times a day (BID) | ORAL | Status: DC

## 2013-12-25 NOTE — Assessment & Plan Note (Signed)
Good compliance with medicines, ambulatory CBGs after dinner still in the high side from the 200s most of the time  to 400 (one time) Plan: Labs, further advice would result
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
## 2013-12-25 NOTE — Progress Notes (Signed)
Pre visit review using our clinic review tool, if applicable. No additional management support is needed unless otherwise documented below in the visit note. 

## 2013-12-25 NOTE — Patient Instructions (Signed)
Get your blood work before you leave   Restart INDERAL for your blood pressure , a prescription was sent   Continue check the  blood pressure 2 or 3 times a  week be sure it is between 110/60 and 140/85. Ideal blood pressure is 120/80. If it is consistently higher or lower, let me know      Next visit is for routine check up regards your blood sugar , blood pressure   in 3 months  No need to come back fasting Please make an appointment

## 2013-12-25 NOTE — Assessment & Plan Note (Signed)
Ambulatory BPs still in the 150s/80s. See previous entries, she is intolerant to carvedilol but was able to take inderal Plan: Continue with present care and add Inderal at previous doses.  Continue monitoring ambulatory BPs, follow up 3 months

## 2013-12-25 NOTE — Assessment & Plan Note (Signed)
Good compliance with Lipitor, Labs

## 2013-12-28 ENCOUNTER — Telehealth: Payer: Self-pay | Admitting: *Deleted

## 2013-12-28 DIAGNOSIS — E1165 Type 2 diabetes mellitus with hyperglycemia: Principal | ICD-10-CM

## 2013-12-28 DIAGNOSIS — IMO0001 Reserved for inherently not codable concepts without codable children: Secondary | ICD-10-CM

## 2013-12-28 MED ORDER — GLIMEPIRIDE 4 MG PO TABS: 4.0000 mg | ORAL_TABLET | Freq: Every day | ORAL | Status: DC

## 2013-12-28 NOTE — Telephone Encounter (Addendum)
Spoke with patient and made aware of lab as well as adding new dm med (glimepiride). Directions explained to pt to take once daily in the morning, if she is going to skip a meal she needs to skip glimepiride as well. Patient advised to watch for low blood sugar as well.      Message copied by Baldwin [place] on Thu Dec 28, 2013  3:41 PM ------      Message from: Willow Ora E      Created: Thu Dec 28, 2013  3:30 PM       Advise patient,      Cholesterol remains well-controlled.      Diabetes needs improvement.      Add glimepiride 4 mg one tablet daily in AM. #30 and 3 refills      Followup 3 months, watch for low sugar symptoms; If she plans to skip a meal , she also needs to skip glimepiride ------

## 2014-01-01 ENCOUNTER — Encounter: Payer: Self-pay | Admitting: Internal Medicine

## 2014-01-03 ENCOUNTER — Other Ambulatory Visit: Payer: Self-pay | Admitting: *Deleted

## 2014-01-03 ENCOUNTER — Encounter: Payer: Self-pay | Admitting: Internal Medicine

## 2014-01-03 MED ORDER — AMLODIPINE BESYLATE 10 MG PO TABS: 10.0000 mg | ORAL_TABLET | Freq: Every day | ORAL | Status: DC

## 2014-01-07 ENCOUNTER — Other Ambulatory Visit: Payer: Self-pay | Admitting: Internal Medicine

## 2014-02-21 ENCOUNTER — Other Ambulatory Visit: Payer: Self-pay | Admitting: Internal Medicine

## 2014-02-23 ENCOUNTER — Other Ambulatory Visit: Payer: Self-pay | Admitting: *Deleted

## 2014-02-23 DIAGNOSIS — IMO0001 Reserved for inherently not codable concepts without codable children: Secondary | ICD-10-CM

## 2014-02-23 DIAGNOSIS — E1165 Type 2 diabetes mellitus with hyperglycemia: Principal | ICD-10-CM

## 2014-02-23 MED ORDER — GLIMEPIRIDE 4 MG PO TABS: 4.0000 mg | ORAL_TABLET | Freq: Every day | ORAL | Status: DC

## 2014-03-08 ENCOUNTER — Other Ambulatory Visit: Payer: Self-pay | Admitting: Internal Medicine

## 2014-03-27 ENCOUNTER — Ambulatory Visit (INDEPENDENT_AMBULATORY_CARE_PROVIDER_SITE_OTHER): Payer: 59 | Admitting: Internal Medicine

## 2014-03-27 ENCOUNTER — Encounter: Payer: Self-pay | Admitting: Internal Medicine

## 2014-03-27 VITALS — BP 128/72 | HR 82 | Temp 98.2°F | Wt 175.7 lb

## 2014-03-27 DIAGNOSIS — E119 Type 2 diabetes mellitus without complications: Secondary | ICD-10-CM

## 2014-03-27 DIAGNOSIS — I10 Essential (primary) hypertension: Secondary | ICD-10-CM

## 2014-03-27 LAB — HM DIABETES FOOT EXAM: HM Diabetic Foot Exam: NORMAL

## 2014-03-27 LAB — BASIC METABOLIC PANEL
BUN: 14 mg/dL (ref 6–23)
CO2: 34 meq/L — AB (ref 19–32)
Calcium: 9.3 mg/dL (ref 8.4–10.5)
Chloride: 97 mEq/L (ref 96–112)
Creatinine, Ser: 0.6 mg/dL (ref 0.4–1.2)
GFR: 122.41 mL/min (ref >60.00)
Glucose, Bld: 70 mg/dL (ref 70–99)
Potassium: 3.9 mEq/L (ref 3.5–5.1)
SODIUM: 138 meq/L (ref 135–145)

## 2014-03-27 LAB — HEMOGLOBIN A1C: Hgb A1c MFr Bld: 6.9 % — ABNORMAL HIGH (ref 4.6–6.5)

## 2014-03-27 NOTE — Progress Notes (Signed)
Pre visit review using our clinic review tool, if applicable. No additional management support is needed unless otherwise documented below in the visit note. 

## 2014-03-27 NOTE — Patient Instructions (Signed)
Get your blood work before you leave   Next visit is by 07-2014   for: A physical exam  Fasting Please make an appointment     Decrease glimepiride  to half tablet daily,  other medications are the same If you have low blood sugars more than twice a week please call us  Get your flu shot in 3-4 weeks

## 2014-03-27 NOTE — Assessment & Plan Note (Addendum)
Doing great with diet and exercise, having low blood sugars in the 50s. Feet examination normal Plan: Continue going to the [facility], low-carb diet Check A1c Decreased glimepiride from 4 mg to 2 mg. Carry glucose w/ her, if she continue  having low blood sugars more than twice a week -- let  me know. Recommend to see eye doctor

## 2014-03-27 NOTE — Assessment & Plan Note (Signed)
Most ambulatory BPs within normal, no change
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
## 2014-03-29 ENCOUNTER — Telehealth: Payer: Self-pay | Admitting: Internal Medicine

## 2014-03-29 ENCOUNTER — Other Ambulatory Visit: Payer: Self-pay

## 2014-03-29 MED ORDER — GLUCOSE BLOOD VI STRP: ORAL_STRIP | Status: DC

## 2014-03-29 NOTE — Telephone Encounter (Signed)
Sent to Pharmacy. 

## 2014-03-29 NOTE — Telephone Encounter (Signed)
Caller name: Enaya Relation to pt: Call back number:[phone] Pharmacy: [facility]  Reason for call:  Pt is needing her one touch test strips sent to the pharmacy.

## 2014-04-04 ENCOUNTER — Telehealth: Payer: Self-pay | Admitting: Internal Medicine

## 2014-04-04 ENCOUNTER — Encounter: Payer: Self-pay | Admitting: Internal Medicine

## 2014-04-04 NOTE — Telephone Encounter (Signed)
LMOM for Pt to return call.  

## 2014-04-04 NOTE — Telephone Encounter (Signed)
Patient sent a message stating that her blood sugars have been in the 50s and 60s.  Please call the patient: Discontinue glimeperide Continue her current diet Needs to get some orange juice or glucose from the pharmacy --->  if she has symptoms of low blood sugar (shakiness, sweats, fatigue) needs to take some sugar. Continue monitoring CBGs, if they increase to more than 130-140 let us know

## 2014-04-05 NOTE — Telephone Encounter (Signed)
Spoke with Pt, gave her instructions to discontinue glimepiride, and monitor BS and to give [place] a call if consistently higher than 130-140.

## 2014-04-23 ENCOUNTER — Ambulatory Visit (INDEPENDENT_AMBULATORY_CARE_PROVIDER_SITE_OTHER): Payer: 59

## 2014-04-23 DIAGNOSIS — Z23 Encounter for immunization: Secondary | ICD-10-CM

## 2014-05-14 ENCOUNTER — Other Ambulatory Visit: Payer: Self-pay

## 2014-05-14 MED ORDER — ATORVASTATIN CALCIUM 20 MG PO TABS: 20.0000 mg | ORAL_TABLET | Freq: Every day | ORAL | Status: DC

## 2014-06-01 ENCOUNTER — Encounter: Payer: Self-pay | Admitting: Internal Medicine

## 2014-06-04 ENCOUNTER — Other Ambulatory Visit: Payer: Self-pay | Admitting: Internal Medicine

## 2014-06-04 ENCOUNTER — Ambulatory Visit (INDEPENDENT_AMBULATORY_CARE_PROVIDER_SITE_OTHER): Payer: 59 | Admitting: Internal Medicine

## 2014-06-04 ENCOUNTER — Encounter: Payer: Self-pay | Admitting: Internal Medicine

## 2014-06-04 VITALS — BP 126/83 | HR 112 | Temp 98.1°F | Wt 169.2 lb

## 2014-06-04 DIAGNOSIS — M79641 Pain in right hand: Secondary | ICD-10-CM

## 2014-06-04 LAB — URIC ACID: Uric Acid, Serum: 6.9 mg/dL (ref 2.4–7.0)

## 2014-06-04 MED ORDER — COLCHICINE 0.6 MG PO TABS: 0.6000 mg | ORAL_TABLET | Freq: Two times a day (BID) | ORAL | Status: DC | PRN

## 2014-06-04 NOTE — Patient Instructions (Signed)
Get your blood work before you leave    If you have pain or swelling in the hand or around the ankle again, take colchicine as needed twice a day.   Call if you don't improve.  If you take colchicine, hold your cholesterol medication atorvastatin for few days.
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
## 2014-06-04 NOTE — Progress Notes (Signed)
Pre visit review using our clinic review tool, if applicable. No additional management support is needed unless otherwise documented below in the visit note. 

## 2014-06-07 ENCOUNTER — Encounter: Payer: Self-pay | Admitting: Internal Medicine

## 2014-06-07 ENCOUNTER — Other Ambulatory Visit: Payer: Self-pay | Admitting: Internal Medicine

## 2014-06-07 DIAGNOSIS — M79643 Pain in unspecified hand: Secondary | ICD-10-CM

## 2014-06-12 ENCOUNTER — Ambulatory Visit: Payer: 59 | Admitting: Family Medicine

## 2014-06-13 ENCOUNTER — Encounter: Payer: Self-pay | Admitting: Family Medicine

## 2014-06-13 ENCOUNTER — Ambulatory Visit (INDEPENDENT_AMBULATORY_CARE_PROVIDER_SITE_OTHER): Payer: 59 | Admitting: Family Medicine

## 2014-06-13 VITALS — BP 116/81 | HR 91 | Ht 61.0 in | Wt 165.0 lb

## 2014-06-13 DIAGNOSIS — M25531 Pain in right wrist: Secondary | ICD-10-CM
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
## 2014-06-13 NOTE — Patient Instructions (Signed)
You have carpal tunnel syndrome. Wear the wrist splint at nighttime and as often as possible during the day Take advil 600mg  three times a day with food for pain and inflammation for 1-2 weeks then as needed. Corticosteroid injection is a consideration to help with pain and inflammation Prednisone dose pack is another considertion. If not improving, will consider nerve conduction studies to assess severity. Follow up with me in 1 month to 6 weeks.

## 2014-06-13 NOTE — Assessment & Plan Note (Signed)
consistent with recurrence of her carpal tunnel syndrome.  Will start with wrist brace to wear at nighttime and as often as possible during the day.  Take advil regularly.  Consider injection or oral prednisone (wary with the latter given history of bipolar disorder - could worsen manic symptoms).  Consider nerve conduction studies also.  F/u in 1 month to 6 weeks.

## 2014-06-24 ENCOUNTER — Other Ambulatory Visit: Payer: Self-pay | Admitting: Internal Medicine

## 2014-06-25 ENCOUNTER — Other Ambulatory Visit: Payer: Self-pay

## 2014-06-25 ENCOUNTER — Encounter: Payer: Self-pay | Admitting: *Deleted

## 2014-07-13 ENCOUNTER — Other Ambulatory Visit: Payer: Self-pay

## 2014-07-13 MED ORDER — BLOOD GLUCOSE TEST VI STRP: ORAL_STRIP | Status: DC

## 2014-07-15 ENCOUNTER — Other Ambulatory Visit: Payer: Self-pay | Admitting: Internal Medicine

## 2014-07-17 ENCOUNTER — Other Ambulatory Visit: Payer: Self-pay | Admitting: Internal Medicine

## 2014-07-17 ENCOUNTER — Ambulatory Visit: Payer: 59 | Admitting: Family Medicine

## 2014-07-27 LAB — HM DIABETES EYE EXAM

## 2014-08-02 ENCOUNTER — Telehealth: Payer: Self-pay | Admitting: Internal Medicine

## 2014-08-02 NOTE — Telephone Encounter (Signed)
Pt is requesting refill on cyclobenazeprine.  Last OV: 06/04/2014 Last Fill: 06/25/2014 # 30 1 RF  Okay to refill?

## 2014-08-02 NOTE — Telephone Encounter (Signed)
She takes it twice a day, prescription # 60 and one refill sent

## 2014-08-07 ENCOUNTER — Encounter: Payer: Medicare Other | Admitting: Internal Medicine

## 2014-10-12 ENCOUNTER — Other Ambulatory Visit: Payer: Self-pay | Admitting: Internal Medicine

## 2014-11-08 ENCOUNTER — Other Ambulatory Visit: Payer: Self-pay | Admitting: Internal Medicine

## 2014-11-08 NOTE — Telephone Encounter (Signed)
Pt is requesting refill on Meloxicam.  Last OV: 06/14/2014 Last Fill: 03/08/2014 #90 1RF   Okay to refill?

## 2014-11-08 NOTE — Telephone Encounter (Signed)
Ok 90, no RF Due for a OV !

## 2014-11-25 ENCOUNTER — Other Ambulatory Visit: Payer: Self-pay | Admitting: Internal Medicine

## 2014-11-29 ENCOUNTER — Other Ambulatory Visit: Payer: Self-pay | Admitting: Internal Medicine

## 2014-11-30 NOTE — Telephone Encounter (Signed)
Schedule office visit within 3 weeks. Okay   #60, no RF

## 2014-11-30 NOTE — Telephone Encounter (Signed)
Sent to [facility]. Pt has CPE scheduled for 12/04/2014 at 0900.

## 2014-11-30 NOTE — Telephone Encounter (Signed)
Pt is requesting refill on Flexeril.  Last OV: 06/04/2014 Last Fill: 08/02/2014 #60 1RF  Please advise.

## 2014-11-30 NOTE — Telephone Encounter (Signed)
Routing error, please see below.  

## 2014-12-03 ENCOUNTER — Telehealth: Payer: Self-pay | Admitting: *Deleted

## 2014-12-03 ENCOUNTER — Encounter: Payer: Self-pay | Admitting: *Deleted

## 2014-12-03 NOTE — Telephone Encounter (Signed)
Pre-Visit Call completed with patient and chart updated.   Pre-Visit Info documented in Specialty Comments under SnapShot.    

## 2014-12-04 ENCOUNTER — Encounter: Payer: Self-pay | Admitting: Internal Medicine

## 2014-12-04 ENCOUNTER — Ambulatory Visit (INDEPENDENT_AMBULATORY_CARE_PROVIDER_SITE_OTHER): Payer: Medicare Other | Admitting: Internal Medicine

## 2014-12-04 VITALS — BP 124/78 | HR 95 | Temp 97.8°F | Ht 61.0 in | Wt 165.4 lb

## 2014-12-04 DIAGNOSIS — E049 Nontoxic goiter, unspecified: Secondary | ICD-10-CM

## 2014-12-04 DIAGNOSIS — E119 Type 2 diabetes mellitus without complications: Secondary | ICD-10-CM

## 2014-12-04 DIAGNOSIS — Z0000 Encounter for general adult medical examination without abnormal findings: Secondary | ICD-10-CM | POA: Diagnosis not present

## 2014-12-04 DIAGNOSIS — E785 Hyperlipidemia, unspecified: Secondary | ICD-10-CM

## 2014-12-04 DIAGNOSIS — Z23 Encounter for immunization: Secondary | ICD-10-CM

## 2014-12-04 DIAGNOSIS — E010 Iodine-deficiency related diffuse (endemic) goiter: Secondary | ICD-10-CM

## 2014-12-04 DIAGNOSIS — I10 Essential (primary) hypertension: Secondary | ICD-10-CM | POA: Diagnosis not present

## 2014-12-04 HISTORY — DX: Iodine-deficiency related diffuse (endemic) goiter: E01.0

## 2014-12-04 LAB — CBC WITH DIFFERENTIAL/PLATELET
BASOS PCT: 0.5 % (ref 0.0–3.0)
Basophils Absolute: 0 10*3/uL (ref 0.0–0.1)
EOS PCT: 1.7 % (ref 0.0–5.0)
Eosinophils Absolute: 0.1 10*3/uL (ref 0.0–0.7)
HEMATOCRIT: 38.4 % (ref 36.0–46.0)
Hemoglobin: 13.2 g/dL (ref 12.0–15.0)
LYMPHS ABS: 2.4 10*3/uL (ref 0.7–4.0)
Lymphocytes Relative: 28 % (ref 12.0–46.0)
MCHC: 34.3 g/dL (ref 30.0–36.0)
MCV: 89.3 fl (ref 78.0–100.0)
MONO ABS: 0.4 10*3/uL (ref 0.1–1.0)
Monocytes Relative: 4.9 % (ref 3.0–12.0)
NEUTROS ABS: 5.5 10*3/uL (ref 1.4–7.7)
Neutrophils Relative %: 64.9 % (ref 43.0–77.0)
PLATELETS: 294 10*3/uL (ref 150.0–400.0)
RBC: 4.3 Mil/uL (ref 3.87–5.11)
RDW: 13.4 % (ref 11.5–15.5)
WBC: 8.5 10*3/uL (ref 4.0–10.5)

## 2014-12-04 LAB — LIPID PANEL
CHOLESTEROL: 123 mg/dL (ref 0–200)
HDL: 39.3 mg/dL (ref >39.00)
LDL Cholesterol: 61 mg/dL (ref 0–99)
NonHDL: 83.7
TRIGLYCERIDES: 116 mg/dL (ref 0.0–149.0)
Total CHOL/HDL Ratio: 3
VLDL: 23.2 mg/dL (ref 0.0–40.0)

## 2014-12-04 LAB — BASIC METABOLIC PANEL
BUN: 17 mg/dL (ref 6–23)
CO2: 31 mEq/L (ref 19–32)
Calcium: 9.5 mg/dL (ref 8.4–10.5)
Chloride: 96 mEq/L (ref 96–112)
Creatinine, Ser: 0.56 mg/dL (ref 0.40–1.20)
GFR: 119.58 mL/min (ref >60.00)
GLUCOSE: 228 mg/dL — AB (ref 70–99)
Potassium: 4.7 mEq/L (ref 3.5–5.1)
SODIUM: 133 meq/L — AB (ref 135–145)

## 2014-12-04 LAB — HEMOGLOBIN A1C: HEMOGLOBIN A1C: 9.5 % — AB (ref 4.6–6.5)

## 2014-12-04 LAB — AST: AST: 13 U/L (ref 0–37)

## 2014-12-04 LAB — ALT: ALT: 22 U/L (ref 0–35)

## 2014-12-04 MED ORDER — AMLODIPINE BESYLATE 10 MG PO TABS: 10.0000 mg | ORAL_TABLET | Freq: Every day | ORAL | Status: DC

## 2014-12-04 MED ORDER — ATORVASTATIN CALCIUM 20 MG PO TABS: 20.0000 mg | ORAL_TABLET | Freq: Every day | ORAL | Status: DC

## 2014-12-04 MED ORDER — METFORMIN HCL 500 MG PO TABS: 1000.0000 mg | ORAL_TABLET | Freq: Two times a day (BID) | ORAL | Status: DC

## 2014-12-04 MED ORDER — LISINOPRIL 40 MG PO TABS: 40.0000 mg | ORAL_TABLET | Freq: Every day | ORAL | Status: DC

## 2014-12-04 NOTE — Assessment & Plan Note (Signed)
Continue with Lipitor, check AST, ALT, FLP

## 2014-12-04 NOTE — Assessment & Plan Note (Signed)
Thyromegaly on exam. Chart reviewed, had   an abnormal thyroid ultrasound back in 2010. Plan: Check a ultrasound

## 2014-12-04 NOTE — Assessment & Plan Note (Addendum)
Tdap--2013  prevnar-- today Pneumonia shot 2010 zostavax discussed before   Colonoscopy 07-2010, Dr. Randa EvensEdwards, had polyps, refer to GI  Addendum-- states she won't do a cscope ever again, mostly d/t the prep. Will discuss again next year   Sees Dr Luna KitchensG Atkins,  Pap-- 12/25/13 per patient normal with Dr. Renaldo FiddlerAdkins  MMG--12/25/13 per patient normal with Dr. Renaldo FiddlerAdkins  last visit a week ago, she had vag bleeding x 1 week already, rec to call her today ref the 2- week h/o vag bleed     Diet doing ok, sedentary lifestyle

## 2014-12-04 NOTE — Assessment & Plan Note (Signed)
Current   follow-up per Dr. Bascom LevelsFrazier, seems to be doing really well

## 2014-12-04 NOTE — Assessment & Plan Note (Signed)
Continue glimepiride, Januvia, metformin. Check labs. Encouraged to be more physically active

## 2014-12-04 NOTE — Assessment & Plan Note (Signed)
Continue amlodipine 10 mg, lisinopril 40 mg, propranolol; check a BMP, BP today is very good but does not check ambulatory BPs.

## 2014-12-04 NOTE — Progress Notes (Signed)
Pre visit review using our clinic review tool, if applicable. No additional management support is needed unless otherwise documented below in the visit note. 
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
## 2014-12-04 NOTE — Patient Instructions (Signed)
Get your blood work before you leave   Please call your  gynecologist today regards the bleeding.  Come back to the office in 4 months   for a routine check up      Fall Prevention and Home Safety Falls cause injuries and can affect all age groups. It is possible to use preventive measures to significantly decrease the likelihood of falls. There are many simple measures which can make your home safer and prevent falls. OUTDOORS  Repair cracks and edges of walkways and driveways.  Remove high doorway thresholds.  Trim shrubbery on the main path into your home.  Have good outside lighting.  Clear walkways of tools, rocks, debris, and clutter.  Check that handrails are not broken and are securely fastened. Both sides of steps should have handrails.  Have leaves, snow, and ice cleared regularly.  Use sand or salt on walkways during winter months.  In the garage, clean up grease or oil spills. BATHROOM  Install night lights.  Install grab bars by the toilet and in the tub and shower.  Use non-skid mats or decals in the tub or shower.  Place a plastic non-slip stool in the shower to sit on, if needed.  Keep floors dry and clean up all water on the floor immediately.  Remove soap buildup in the tub or shower on a regular basis.  Secure bath mats with non-slip, double-sided rug tape.  Remove throw rugs and tripping hazards from the floors. BEDROOMS  Install night lights.  Make sure a bedside light is easy to reach.  Do not use oversized bedding.  Keep a telephone by your bedside.  Have a firm chair with side arms to use for getting dressed.  Remove throw rugs and tripping hazards from the floor. KITCHEN  Keep handles on pots and pans turned toward the center of the stove. Use back burners when possible.  Clean up spills quickly and allow time for drying.  Avoid walking on wet floors.  Avoid hot utensils and knives.  Position shelves so they are not too  high or low.  Place commonly used objects within easy reach.  If necessary, use a sturdy step stool with a grab bar when reaching.  Keep electrical cables out of the way.  Do not use floor polish or wax that makes floors slippery. If you must use wax, use non-skid floor wax.  Remove throw rugs and tripping hazards from the floor. STAIRWAYS  Never leave objects on stairs.  Place handrails on both sides of stairways and use them. Fix any loose handrails. Make sure handrails on both sides of the stairways are as long as the stairs.  Check carpeting to make sure it is firmly attached along stairs. Make repairs to worn or loose carpet promptly.  Avoid placing throw rugs at the top or bottom of stairways, or properly secure the rug with carpet tape to prevent slippage. Get rid of throw rugs, if possible.  Have an electrician put in a light switch at the top and bottom of the stairs. OTHER FALL PREVENTION TIPS  Wear low-heel or rubber-soled shoes that are supportive and fit well. Wear closed toe shoes.  When using a stepladder, make sure it is fully opened and both spreaders are firmly locked. Do not climb a closed stepladder.  Add color or contrast paint or tape to grab bars and handrails in your home. Place contrasting color strips on first and last steps.  Learn and use mobility aids as needed.  Install an electrical emergency response system.  Turn on lights to avoid dark areas. Replace light bulbs that burn out immediately. Get light switches that glow.  Arrange furniture to create clear pathways. Keep furniture in the same place.  Firmly attach carpet with non-skid or double-sided tape.  Eliminate uneven floor surfaces.  Select a carpet pattern that does not visually hide the edge of steps.  Be aware of all pets. OTHER HOME SAFETY TIPS  Set the water temperature for 120 F (48.8 C).  Keep emergency numbers on or near the telephone.  Keep smoke detectors on every level  of the home and near sleeping areas. Document Released: 07/03/2002 Document Revised: 01/12/2012 Document Reviewed: 10/02/2011 [facility]ExitCare Patient Information 2015 [place], [place]. This information is not intended to replace advice given to you by your health care provider. Make sure you discuss any questions you have with your health care provider.   Preventive Care for Adults Ages 5465 and over  Blood pressure check.** / Every 1 to 2 years.  Lipid and cholesterol check.**/ Every 5 years beginning at age 55.  Lung cancer screening. / Every year if you are aged 55-80 years and have a 30-pack-year history of smoking and currently smoke or have quit within the past 15 years. Yearly screening is stopped once you have quit smoking for at least 15 years or develop a health problem that would prevent you from having lung cancer treatment.  Fecal occult blood test (FOBT) of stool. / Every year beginning at age 55 and continuing until age 55. You may not have to do this test if you get a colonoscopy every 10 years.  Flexible sigmoidoscopy** or colonoscopy.** / Every 5 years for a flexible sigmoidoscopy or every 10 years for a colonoscopy beginning at age 55 and continuing until age 55.  Hepatitis C blood test.** / For all people born from [id number] through 1966 and any individual with known risks for hepatitis C.  Abdominal aortic aneurysm (AAA) screening.** / A one-time screening for ages 7365 to 6275 years who are current or former smokers.  Skin self-exam. / Monthly.  Influenza vaccine. / Every year.  Tetanus, diphtheria, and acellular pertussis (Tdap/Td) vaccine.** / 1 dose of Td every 10 years.  Varicella vaccine.** / Consult your health care provider.  Zoster vaccine.** / 1 dose for adults aged 860 years or older.  Pneumococcal 13-valent conjugate (PCV13) vaccine.** / Consult your health care provider.  Pneumococcal polysaccharide (PPSV23) vaccine.** / 1 dose for all adults aged 55 years and  older.  Meningococcal vaccine.** / Consult your health care provider.  Hepatitis A vaccine.** / Consult your health care provider.  Hepatitis B vaccine.** / Consult your health care provider.  Haemophilus influenzae type b (Hib) vaccine.** / Consult your health care provider. **Family history and personal history of risk and conditions may change your health care provider's recommendations. Document Released: 09/08/2001 Document Revised: 07/18/2013 Document Reviewed: 12/08/2010 [facility] Patient Information 2015 [place], [place]. This information is not intended to replace advice given to you by your health care provider. Make sure you discuss any questions you have with your health care provider.

## 2014-12-07 MED ORDER — GLIMEPIRIDE 4 MG PO TABS: ORAL_TABLET | ORAL | Status: DC

## 2014-12-07 NOTE — Addendum Note (Signed)
Addended by: Dorette GrateFAULKNER, Lanis Storlie C on: 12/07/2014 09:31 AM   Modules accepted: Orders

## 2014-12-22 ENCOUNTER — Other Ambulatory Visit: Payer: Self-pay | Admitting: Internal Medicine

## 2014-12-25 ENCOUNTER — Other Ambulatory Visit: Payer: Self-pay

## 2014-12-25 MED ORDER — AMLODIPINE BESYLATE 10 MG PO TABS
10.0000 mg | ORAL_TABLET | Freq: Every day | ORAL | Status: DC
Start: 2014-12-25 — End: 2015-07-26

## 2014-12-25 NOTE — Telephone Encounter (Signed)
Rx faxed to [facility] pharmacy.  

## 2014-12-25 NOTE — Telephone Encounter (Signed)
E-prescribe currently down. Rx printed, awaiting MD signature.

## 2014-12-27 ENCOUNTER — Telehealth: Payer: Self-pay | Admitting: Internal Medicine

## 2014-12-27 NOTE — Telephone Encounter (Signed)
Relation to pt: self  Call back number:[phone]   Reason for call:  Pt in need of clarification to why she needs a US SOFT TIS HEAD/NECK/CHEST. Please advise

## 2014-12-28 NOTE — Telephone Encounter (Signed)
Spoke with Pt regarding US Thyroid. Informed her that I reviewed her chart and per her OV on 12/05/2014, Dr. Drue NovelPaz stated she had an abnormal ultrasound of her thyroid in 2010 and Dr. Drue NovelPaz just wanted to recheck to make sure her thyroid is stable. Pt verbalized understanding and is ready to schedule US Thyroid. Please contact Pt at her home number. Thank you.

## 2014-12-30 ENCOUNTER — Ambulatory Visit (HOSPITAL_BASED_OUTPATIENT_CLINIC_OR_DEPARTMENT_OTHER)
Admission: RE | Admit: 2014-12-30 | Discharge: 2014-12-30 | Disposition: A | Discharge status: Home / Self Care | Payer: Medicare Other | Source: Ambulatory Visit | Attending: Internal Medicine | Admitting: Internal Medicine

## 2014-12-30 DIAGNOSIS — E041 Nontoxic single thyroid nodule: Secondary | ICD-10-CM | POA: Insufficient documentation

## 2014-12-30 DIAGNOSIS — E010 Iodine-deficiency related diffuse (endemic) goiter: Secondary | ICD-10-CM | POA: Diagnosis present

## 2014-12-30 DIAGNOSIS — E049 Nontoxic goiter, unspecified: Secondary | ICD-10-CM | POA: Diagnosis not present

## 2015-01-01 ENCOUNTER — Other Ambulatory Visit: Payer: Self-pay

## 2015-01-01 DIAGNOSIS — E041 Nontoxic single thyroid nodule: Secondary | ICD-10-CM

## 2015-01-07 ENCOUNTER — Encounter: Payer: Self-pay | Admitting: Internal Medicine

## 2015-01-08 ENCOUNTER — Other Ambulatory Visit: Payer: Self-pay | Admitting: Internal Medicine

## 2015-01-08 NOTE — Telephone Encounter (Signed)
Ok 60 and 1 Rf 

## 2015-01-08 NOTE — Telephone Encounter (Signed)
Pt is requesting refill on Flexeril.  Last OV: 12/04/2014 Last Fill: 11/30/2014 #60 0RF   Please advise.

## 2015-01-08 NOTE — Telephone Encounter (Signed)
Rx sent to pharmacy   

## 2015-01-09 ENCOUNTER — Encounter: Payer: Self-pay | Admitting: Internal Medicine

## 2015-01-10 ENCOUNTER — Telehealth: Payer: Self-pay | Admitting: Internal Medicine

## 2015-01-10 NOTE — Telephone Encounter (Signed)
Caller name: Bayle Schwamberger Relationship to patient: self Can be reached: [phone] Pharmacy:  Reason for call: Pt said she sent email to Dr. [name] yesterday. She believes there was a mix up. She received a call about a root canal but has not heard about having a biopsy on her neck. Please call her cell.

## 2015-01-10 NOTE — Telephone Encounter (Signed)
Spoke with Darel Hong, at [facility], and [facility] office, she informed me that she has already spoke with Pt this morning and the Pt no longer has an appt with them for a root canal. I verbalized understanding. Also, spoke with Victorino Dike and she informed me that [facility] has scheduled appt for Pt's Thyroid Biopsy for January 24, 2015.

## 2015-01-10 NOTE — Telephone Encounter (Signed)
Agree, we have not refer her for a root canal. Recommend her to talk with the dental office and if she is told we refer her , then let us know.

## 2015-01-10 NOTE — Telephone Encounter (Signed)
I'm not sure what Pt is referring to, I have sent the MyChart message to you as well. I placed an order for a thyroid biopsy, not a root canal???

## 2015-01-18 ENCOUNTER — Encounter: Payer: Self-pay | Admitting: Internal Medicine

## 2015-01-24 ENCOUNTER — Other Ambulatory Visit (HOSPITAL_COMMUNITY)
Admission: RE | Admit: 2015-01-24 | Discharge: 2015-01-24 | Disposition: A | Discharge status: Home / Self Care | Payer: Medicare Other | Source: Ambulatory Visit | Attending: Interventional Radiology | Admitting: Interventional Radiology

## 2015-01-24 ENCOUNTER — Ambulatory Visit
Admission: RE | Admit: 2015-01-24 | Discharge: 2015-01-24 | Disposition: A | Discharge status: Home / Self Care | Payer: Medicare Other | Source: Ambulatory Visit | Attending: Internal Medicine | Admitting: Internal Medicine

## 2015-01-24 DIAGNOSIS — E041 Nontoxic single thyroid nodule: Secondary | ICD-10-CM | POA: Insufficient documentation

## 2015-03-14 ENCOUNTER — Ambulatory Visit (INDEPENDENT_AMBULATORY_CARE_PROVIDER_SITE_OTHER): Payer: Medicare Other | Admitting: Internal Medicine

## 2015-03-14 ENCOUNTER — Encounter: Payer: Self-pay | Admitting: Internal Medicine

## 2015-03-14 VITALS — BP 124/76 | HR 102 | Temp 98.1°F | Ht 61.0 in | Wt 163.5 lb

## 2015-03-14 DIAGNOSIS — T7840XA Allergy, unspecified, initial encounter: Secondary | ICD-10-CM

## 2015-03-14 MED ORDER — PREDNISONE 10 MG PO TABS: 10.0000 mg | ORAL_TABLET | Freq: Every day | ORAL | Status: DC

## 2015-03-14 MED ORDER — TRIAMCINOLONE ACETONIDE 0.1 % EX LOTN: 1.0000 "application " | TOPICAL_LOTION | Freq: Three times a day (TID) | CUTANEOUS | Status: DC

## 2015-03-14 MED ORDER — NYSTATIN-TRIAMCINOLONE 100000-0.1 UNIT/GM-% EX OINT: 1.0000 "application " | TOPICAL_OINTMENT | Freq: Two times a day (BID) | CUTANEOUS | Status: DC

## 2015-03-14 NOTE — Progress Notes (Signed)
Pre visit review using our clinic review tool, if applicable. No additional management support is needed unless otherwise documented below in the visit note. 

## 2015-03-14 NOTE — Patient Instructions (Addendum)
Stop the new facial cleaner  Prednisone for 5 days  Use Kenalog 2-3  times a day your face as needed  Use Mycolog at the corner of the mouth twice a day for 5 days  Call if not improving.  Please contact Dr Donell Beers regard your hands movement
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
## 2015-03-15 ENCOUNTER — Telehealth: Payer: Self-pay

## 2015-03-15 MED ORDER — MOMETASONE FUROATE 0.1 % EX SOLN: Freq: Every day | CUTANEOUS | Status: DC

## 2015-03-15 NOTE — Telephone Encounter (Signed)
New rx sent. If that is not covered, they need to send me a list of covered corticosteroids lotions

## 2015-03-15 NOTE — Telephone Encounter (Signed)
Noted  

## 2015-03-15 NOTE — Telephone Encounter (Signed)
Received fax from [facility] pharmacy. Pt's insurance will not cover Triamcinolone 0.1 % lotion, needing alternative. Please advise.

## 2015-03-20 ENCOUNTER — Encounter: Payer: Self-pay | Admitting: Internal Medicine

## 2015-03-21 ENCOUNTER — Other Ambulatory Visit: Payer: Self-pay | Admitting: Internal Medicine

## 2015-03-21 MED ORDER — LIDOCAINE VISCOUS 2 % MT SOLN: 15.0000 mL | Freq: Four times a day (QID) | OROMUCOSAL | Status: DC | PRN

## 2015-04-02 ENCOUNTER — Other Ambulatory Visit: Payer: Self-pay | Admitting: Internal Medicine

## 2015-04-02 NOTE — Telephone Encounter (Signed)
Okay #60 and 2 refills 

## 2015-04-02 NOTE — Telephone Encounter (Signed)
Rx sent 

## 2015-04-02 NOTE — Telephone Encounter (Signed)
Pt is requesting refill on Cyclobenzaprine.  Last OV: 03/14/2015 Last Fill: 01/08/2015 #60 1RF  Okay to refill?

## 2015-04-05 ENCOUNTER — Encounter: Payer: Self-pay | Admitting: Internal Medicine

## 2015-04-08 ENCOUNTER — Ambulatory Visit: Payer: Medicare Other | Admitting: Internal Medicine

## 2015-07-19 ENCOUNTER — Telehealth: Payer: Self-pay

## 2015-07-19 NOTE — Telephone Encounter (Signed)
Agree. Please check on her next week

## 2015-07-19 NOTE — Telephone Encounter (Signed)
Danford BadKristie reported that she was currently on hold with [facility].  Pt called in with elevated blood sugars greater than 500.  States pt was currently at eye doctor ([facility]) and was calling for advice.  Danford BadKristie lost call.  Called patient's cell phone.   Patient answered and said that her blood sugar is between 500 and 550.  She says that she has been having trouble swallowing her medications and therefore has not been taking them as prescribed.  She says that she is currently taking Januvia and metformin.  She says that she feels fine, but also reports double/blurred vision and dry mouth/trouble swallowing.     Advice:  Pt was advised to have someone drive her to ER now.  She says she has a friend there with her now and says that she will have her drive her.  Pt encouraged to call back with other questions or concerns.  She stated understanding and agreed.

## 2015-07-25 NOTE — Telephone Encounter (Signed)
Left message for call back.

## 2015-07-25 NOTE — Telephone Encounter (Signed)
Called to follow up with patient.  Left a message for call back.  Pt has an ER follow up scheduled with Dr. Drue NovelPaz tomorrow (07/26/15) at 4:00 pm.

## 2015-07-26 ENCOUNTER — Encounter: Payer: Self-pay | Admitting: Internal Medicine

## 2015-07-26 ENCOUNTER — Ambulatory Visit (INDEPENDENT_AMBULATORY_CARE_PROVIDER_SITE_OTHER): Payer: Medicare Other | Admitting: Internal Medicine

## 2015-07-26 VITALS — BP 124/66 | HR 104 | Temp 98.2°F | Ht 61.0 in | Wt 147.1 lb

## 2015-07-26 DIAGNOSIS — I10 Essential (primary) hypertension: Secondary | ICD-10-CM

## 2015-07-26 DIAGNOSIS — E118 Type 2 diabetes mellitus with unspecified complications: Secondary | ICD-10-CM | POA: Diagnosis not present

## 2015-07-26 DIAGNOSIS — E1165 Type 2 diabetes mellitus with hyperglycemia: Secondary | ICD-10-CM

## 2015-07-26 DIAGNOSIS — IMO0002 Reserved for concepts with insufficient information to code with codable children: Secondary | ICD-10-CM

## 2015-07-26 DIAGNOSIS — H532 Diplopia: Secondary | ICD-10-CM | POA: Diagnosis not present

## 2015-07-26 DIAGNOSIS — K121 Other forms of stomatitis: Secondary | ICD-10-CM | POA: Diagnosis not present

## 2015-07-26 DIAGNOSIS — R319 Hematuria, unspecified: Secondary | ICD-10-CM

## 2015-07-26 LAB — GLUCOSE, POCT (MANUAL RESULT ENTRY): POC GLUCOSE: 200 mg/dL — AB (ref 70–99)

## 2015-07-26 MED ORDER — AMLODIPINE BESYLATE 10 MG PO TABS: 10.0000 mg | ORAL_TABLET | Freq: Every day | ORAL | Status: DC

## 2015-07-26 MED ORDER — LIDOCAINE VISCOUS 2 % MT SOLN: 15.0000 mL | Freq: Four times a day (QID) | OROMUCOSAL | Status: DC | PRN

## 2015-07-26 NOTE — Progress Notes (Signed)
Pre visit review using our clinic review tool, if applicable. No additional management support is needed unless otherwise documented below in the visit note. 
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
## 2015-07-26 NOTE — Patient Instructions (Signed)
Stop lisinopril  Taken amlodipine along with all the other medications.  Before you take your pills: Wet your mouth with water, use lidocaine gel as needed Crush the pills and taken them little by little.  Diabetes: Check your blood sugar  2 or 3 times a day Check your blood sugar  at different times of the day  GOALS: Fasting before a meal 70- 130 2 hours after a meal less than 180 At bedtime 90-150  Go to the ER if you are blood sugars are consistently more than 350. Watch for low blood sugars  Check the  blood pressure daily  Be sure your blood pressure is between 110/65 and  145/85.  if it is consistently higher or lower, let me know     Come back in 3 weeks.

## 2015-07-26 NOTE — Telephone Encounter (Signed)
Unable to reach patient. She has an appt scheduled with Dr. Drue NovelPaz today at 4:00 pm.

## 2015-07-27 LAB — URINALYSIS, ROUTINE W REFLEX MICROSCOPIC
Bilirubin Urine: NEGATIVE
Ketones, ur: NEGATIVE
Leukocytes, UA: NEGATIVE
NITRITE: NEGATIVE
PH: 6 (ref 5.0–8.0)
SPECIFIC GRAVITY, URINE: 1.023 (ref 1.001–1.035)

## 2015-07-27 LAB — URINALYSIS, MICROSCOPIC ONLY
Bacteria, UA: NONE SEEN [HPF]
Casts: NONE SEEN [LPF]
Crystals: NONE SEEN [HPF]
RBC / HPF: NONE SEEN RBC/HPF (ref <=2)
Yeast: NONE SEEN [HPF]

## 2015-07-28 LAB — URINE CULTURE
Colony Count: NO GROWTH
ORGANISM ID, BACTERIA: NO GROWTH

## 2015-07-31 ENCOUNTER — Other Ambulatory Visit: Payer: Self-pay

## 2015-07-31 ENCOUNTER — Telehealth: Payer: Self-pay | Admitting: Internal Medicine

## 2015-07-31 MED ORDER — BLOOD GLUCOSE TEST VI STRP: ORAL_STRIP | Status: DC

## 2015-07-31 MED ORDER — ONETOUCH ULTRASOFT LANCETS MISC: Status: DC

## 2015-07-31 NOTE — Telephone Encounter (Signed)
Test strips and Lancets sent to [facility] already, #100 of each, and 12 refills. Pt should not be checking blood sugar more than twice daily indicated on Rx.

## 2015-07-31 NOTE — Telephone Encounter (Signed)
Caller name: Elease Hashimotoatricia Relation to pt: self Call back number: [phone] Pharmacy:[facility] on [address]  Reason for call: Pt came in office to inform that insurance will cover test strips 300 in 3 months for the One Touch Verio Flex and for Lancets 400 in three months with a prescription indicating how many times pt needs it in a day. Please advise.

## 2015-08-24 ENCOUNTER — Other Ambulatory Visit: Payer: Self-pay | Admitting: Internal Medicine

## 2015-09-02 ENCOUNTER — Encounter: Payer: Self-pay | Admitting: Internal Medicine

## 2015-09-02 ENCOUNTER — Ambulatory Visit (INDEPENDENT_AMBULATORY_CARE_PROVIDER_SITE_OTHER): Payer: Medicare Other | Admitting: Internal Medicine

## 2015-09-02 ENCOUNTER — Ambulatory Visit (HOSPITAL_BASED_OUTPATIENT_CLINIC_OR_DEPARTMENT_OTHER)
Admission: RE | Admit: 2015-09-02 | Discharge: 2015-09-02 | Disposition: A | Discharge status: Home / Self Care | Payer: Medicare Other | Source: Ambulatory Visit | Attending: Internal Medicine | Admitting: Internal Medicine

## 2015-09-02 VITALS — BP 124/76 | HR 104 | Temp 98.5°F | Ht 61.0 in | Wt 149.0 lb

## 2015-09-02 DIAGNOSIS — E118 Type 2 diabetes mellitus with unspecified complications: Secondary | ICD-10-CM

## 2015-09-02 DIAGNOSIS — Z23 Encounter for immunization: Secondary | ICD-10-CM | POA: Diagnosis not present

## 2015-09-02 DIAGNOSIS — M7989 Other specified soft tissue disorders: Secondary | ICD-10-CM | POA: Insufficient documentation

## 2015-09-02 DIAGNOSIS — I10 Essential (primary) hypertension: Secondary | ICD-10-CM | POA: Diagnosis not present

## 2015-09-02 DIAGNOSIS — M25532 Pain in left wrist: Secondary | ICD-10-CM

## 2015-09-02 LAB — BASIC METABOLIC PANEL
BUN: 16 mg/dL (ref 6–23)
CALCIUM: 9.6 mg/dL (ref 8.4–10.5)
CHLORIDE: 94 meq/L — AB (ref 96–112)
CO2: 32 meq/L (ref 19–32)
CREATININE: 0.52 mg/dL (ref 0.40–1.20)
GFR: 129.9 mL/min (ref >60.00)
GLUCOSE: 291 mg/dL — AB (ref 70–99)
Potassium: 4.8 mEq/L (ref 3.5–5.1)
SODIUM: 134 meq/L — AB (ref 135–145)

## 2015-09-02 LAB — HEMOGLOBIN A1C: Hgb A1c MFr Bld: 12.1 % — ABNORMAL HIGH (ref 4.6–6.5)

## 2015-09-02 MED ORDER — GLIMEPIRIDE 4 MG PO TABS: 4.0000 mg | ORAL_TABLET | Freq: Every day | ORAL | Status: DC
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
## 2015-09-02 NOTE — Patient Instructions (Signed)
GO TO THE LAB : Get the blood work    GO TO THE FRONT DESK   Schedule a routine office visit or check up to be done in  3 months, no fasting    STOP BY THE FIRST FLOOR:  get the XR   ---- Continue with the same medications including glimepiride 4 mg one tablet daily.  Diabetes: Check your blood sugar  once a day    Check your blood sugar  at different times of the day  GOALS: Fasting before a meal 70- 130 2 hours after a meal less than 180 At bedtime 90-150 Call if consistently not at goal Watch for low sugars  --  Use the splinter   IBUPROFEN (Advil or Motrin) 200 mg 2 tablets every 6 hours as needed for pain.  Always take it with food because may cause gastritis and ulcers.  If you notice nausea, stomach pain, change in the color of stools --->  Stop the medicine and let us know  Call if the pain is not better in 1- 2 weeks  ----  Check the  blood pressure 2 or 3 times a week Be sure your blood pressure is between 110/65 and  145/85. If it is consistently higher or lower, let me know

## 2015-09-02 NOTE — Progress Notes (Signed)
Pre visit review using our clinic review tool, if applicable. No additional management support is needed unless otherwise documented below in the visit note. 

## 2015-09-03 NOTE — Addendum Note (Signed)
Addended by: Willow Ora E on: 09/03/2015 05:46 PM   Modules accepted: Orders, Medications

## 2015-11-13 ENCOUNTER — Telehealth: Payer: Self-pay | Admitting: Internal Medicine

## 2015-11-13 ENCOUNTER — Other Ambulatory Visit: Payer: Self-pay | Admitting: Internal Medicine

## 2015-11-13 NOTE — Telephone Encounter (Signed)
Please advise, Meloxicam no longer on med list. Flexeril refilled earlier today.

## 2015-11-13 NOTE — Telephone Encounter (Signed)
Relation to ZO:XWRUpt:self Call back number:[phone] [id number] - [place], [place] - [address] [phone] (Phone) [phone] (Fax)         Reason for call:  Patient requesting a refill meloxicam 7.5mg 

## 2015-11-13 NOTE — Telephone Encounter (Signed)
Okay #60 and one refill 

## 2015-11-13 NOTE — Telephone Encounter (Signed)
Pt is requesting refill on Flexeril.  Last OV: 09/02/2015 Last Fill: 08/27/2015 #60 and 1RF  Please advise.

## 2015-11-13 NOTE — Telephone Encounter (Signed)
Rx sent 

## 2015-11-14 MED ORDER — MELOXICAM 7.5 MG PO TABS: 7.5000 mg | ORAL_TABLET | Freq: Every day | ORAL | Status: DC | PRN

## 2015-11-14 NOTE — Telephone Encounter (Signed)
Okay to take meloxicam 7.5 mg 1 by mouth daily as needed #30 and one refill

## 2015-11-14 NOTE — Telephone Encounter (Signed)
Rx sent 

## 2015-11-22 ENCOUNTER — Encounter: Payer: Self-pay | Admitting: Internal Medicine

## 2015-12-02 ENCOUNTER — Ambulatory Visit (INDEPENDENT_AMBULATORY_CARE_PROVIDER_SITE_OTHER): Payer: Medicare Other | Admitting: Internal Medicine

## 2015-12-02 ENCOUNTER — Encounter: Payer: Self-pay | Admitting: Internal Medicine

## 2015-12-02 VITALS — BP 126/74 | HR 83 | Temp 97.6°F | Ht 61.0 in | Wt 148.1 lb

## 2015-12-02 DIAGNOSIS — E1165 Type 2 diabetes mellitus with hyperglycemia: Secondary | ICD-10-CM | POA: Diagnosis not present

## 2015-12-02 DIAGNOSIS — E785 Hyperlipidemia, unspecified: Secondary | ICD-10-CM

## 2015-12-02 DIAGNOSIS — Z09 Encounter for follow-up examination after completed treatment for conditions other than malignant neoplasm: Secondary | ICD-10-CM | POA: Insufficient documentation

## 2015-12-02 DIAGNOSIS — I10 Essential (primary) hypertension: Secondary | ICD-10-CM

## 2015-12-02 LAB — LIPID PANEL
CHOL/HDL RATIO: 4
Cholesterol: 146 mg/dL (ref 0–200)
HDL: 36.6 mg/dL — AB (ref >39.00)
LDL Cholesterol: 81 mg/dL (ref 0–99)
NonHDL: 109.79
TRIGLYCERIDES: 145 mg/dL (ref 0.0–149.0)
VLDL: 29 mg/dL (ref 0.0–40.0)

## 2015-12-02 LAB — HEMOGLOBIN A1C: Hgb A1c MFr Bld: 11.3 % — ABNORMAL HIGH (ref 4.6–6.5)

## 2015-12-02 LAB — AST: AST: 20 U/L (ref 0–37)

## 2015-12-02 LAB — ALT: ALT: 40 U/L — ABNORMAL HIGH (ref 0–35)

## 2015-12-02 NOTE — Progress Notes (Signed)
Pre visit review using our clinic review tool, if applicable. No additional management support is needed unless otherwise documented below in the visit note. 
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
## 2015-12-02 NOTE — Patient Instructions (Addendum)
GO TO THE LAB :      Get the blood work     GO TO THE FRONT DESK Schedule your next appointment for a  PHYSICAL EXAM IN 3 MONTHS, NO FASTING    Start an ASPIRIN 81 mg 1 a day

## 2015-12-02 NOTE — Assessment & Plan Note (Signed)
DM: Poorly controlled, discussed the patient the benefits of diet/exercise. Discussed long-term complications including CAD, renal failure, amputations etc. Encouraged to work on her diet, exercise and medication compliance. Check A1c. Consider a dd a med (Invokana?) vs endocrinology referral. Again the patient states she won't use any injectables Feet exam normal, start aspirin HTN: Under excellent control High cholesterol: Continue Lipitor, check a FLP AST ALT DJD: Good compliance and tolerance with meloxicam RTC 3 months, CPX

## 2015-12-05 MED ORDER — CANAGLIFLOZIN 100 MG PO TABS: 100.0000 mg | ORAL_TABLET | Freq: Every day | ORAL | Status: DC

## 2015-12-05 NOTE — Addendum Note (Signed)
Addended byConrad Nikolai: Tassie Pollett D on: 12/05/2015 10:26 AM   Modules accepted: Orders

## 2015-12-11 ENCOUNTER — Other Ambulatory Visit: Payer: Self-pay | Admitting: Internal Medicine

## 2016-02-21 ENCOUNTER — Encounter: Payer: Self-pay | Admitting: Internal Medicine

## 2016-02-21 MED ORDER — ONETOUCH ULTRASOFT LANCETS MISC: 12 refills | Status: DC

## 2016-02-21 MED ORDER — BLOOD GLUCOSE TEST VI STRP: ORAL_STRIP | 12 refills | Status: DC

## 2016-02-24 ENCOUNTER — Other Ambulatory Visit: Payer: Self-pay | Admitting: Internal Medicine

## 2016-03-04 ENCOUNTER — Encounter: Payer: Self-pay | Admitting: Internal Medicine

## 2016-03-04 ENCOUNTER — Ambulatory Visit (INDEPENDENT_AMBULATORY_CARE_PROVIDER_SITE_OTHER): Payer: Medicare Other | Admitting: Internal Medicine

## 2016-03-04 VITALS — BP 126/68 | HR 94 | Temp 98.1°F | Resp 14 | Ht 61.0 in | Wt 149.4 lb

## 2016-03-04 DIAGNOSIS — E785 Hyperlipidemia, unspecified: Secondary | ICD-10-CM | POA: Diagnosis not present

## 2016-03-04 DIAGNOSIS — M25561 Pain in right knee: Secondary | ICD-10-CM

## 2016-03-04 DIAGNOSIS — M159 Polyosteoarthritis, unspecified: Secondary | ICD-10-CM

## 2016-03-04 DIAGNOSIS — M150 Primary generalized (osteo)arthritis: Secondary | ICD-10-CM | POA: Diagnosis not present

## 2016-03-04 DIAGNOSIS — M1990 Unspecified osteoarthritis, unspecified site: Secondary | ICD-10-CM

## 2016-03-04 DIAGNOSIS — I10 Essential (primary) hypertension: Secondary | ICD-10-CM

## 2016-03-04 DIAGNOSIS — E1165 Type 2 diabetes mellitus with hyperglycemia: Secondary | ICD-10-CM | POA: Diagnosis not present

## 2016-03-04 DIAGNOSIS — Z0000 Encounter for general adult medical examination without abnormal findings: Secondary | ICD-10-CM

## 2016-03-04 DIAGNOSIS — E118 Type 2 diabetes mellitus with unspecified complications: Secondary | ICD-10-CM | POA: Diagnosis not present

## 2016-03-04 DIAGNOSIS — M25562 Pain in left knee: Secondary | ICD-10-CM

## 2016-03-04 HISTORY — DX: Unspecified osteoarthritis, unspecified site: M19.90

## 2016-03-04 LAB — BASIC METABOLIC PANEL
BUN: 16 mg/dL (ref 6–23)
CO2: 32 meq/L (ref 19–32)
Calcium: 9.7 mg/dL (ref 8.4–10.5)
Chloride: 98 mEq/L (ref 96–112)
Creatinine, Ser: 0.63 mg/dL (ref 0.40–1.20)
GFR: 103.91 mL/min (ref >60.00)
GLUCOSE: 90 mg/dL (ref 70–99)
POTASSIUM: 4.3 meq/L (ref 3.5–5.1)
SODIUM: 135 meq/L (ref 135–145)

## 2016-03-04 LAB — CBC WITH DIFFERENTIAL/PLATELET
BASOS PCT: 0.4 % (ref 0.0–3.0)
Basophils Absolute: 0 10*3/uL (ref 0.0–0.1)
EOS PCT: 1.7 % (ref 0.0–5.0)
Eosinophils Absolute: 0.1 10*3/uL (ref 0.0–0.7)
HCT: 39.7 % (ref 36.0–46.0)
HEMOGLOBIN: 13.5 g/dL (ref 12.0–15.0)
LYMPHS PCT: 33 % (ref 12.0–46.0)
Lymphs Abs: 2.8 10*3/uL (ref 0.7–4.0)
MCHC: 34.1 g/dL (ref 30.0–36.0)
MCV: 86.9 fl (ref 78.0–100.0)
Monocytes Absolute: 0.6 10*3/uL (ref 0.1–1.0)
Monocytes Relative: 7 % (ref 3.0–12.0)
Neutro Abs: 5 10*3/uL (ref 1.4–7.7)
Neutrophils Relative %: 57.9 % (ref 43.0–77.0)
PLATELETS: 300 10*3/uL (ref 150.0–400.0)
RBC: 4.57 Mil/uL (ref 3.87–5.11)
RDW: 13.6 % (ref 11.5–15.5)
WBC: 8.6 10*3/uL (ref 4.0–10.5)

## 2016-03-04 LAB — HEMOGLOBIN A1C: HEMOGLOBIN A1C: 12.2 % — AB (ref 4.6–6.5)

## 2016-03-04 LAB — CK: Total CK: 49 U/L (ref 7–177)

## 2016-03-04 LAB — ALT: ALT: 42 U/L — ABNORMAL HIGH (ref 0–35)

## 2016-03-04 LAB — AST: AST: 31 U/L (ref 0–37)

## 2016-03-04 NOTE — Assessment & Plan Note (Signed)
DM: Poorly controlled, reportedly takes Januvia, metformin and glimepiride regularly but not invokana although denies side effects from it. Plan: compliance! , check A1c, risk of uncontrolled DM discussed , RTC 3 months, CBG goasl discussed  HTN: Continue amlodipine, Inderal, check a BMP High cholesterol: Controlled Leg pain as described above: new sx. Normal vascular exam, check a CK, on statins IBS: No recent symptoms DJD: Meloxicam as needed without apparent side effects RTC 3 months

## 2016-03-04 NOTE — Assessment & Plan Note (Addendum)
Tdap--2013;   prevnar-- 2016;  Pneumonia shot 2010;  zostavax discussed before   Colonoscopy 07-2010, Adrienne. Randa Bennett, had polyps; she is due for a repeated colonoscopy but again she states she would never do that again.  Options are: do nothing, hemoccults , cologuard, pro/cons for each discussed.Adrienne Bennett. Elected hemocults  Female care:Sees Adrienne Bennett, was seen this year     Had a bone density tests with Adrienne. [name], previous gynecology, reportedly normal. She is just getting on  her menopause. Reassess the need for DEXA in the future   Counseled healthy diet, increase physical activity. + FH CAD:  intolerant to aspirin daily  due to gum bleeding, recommend to take one aspirin at least 3 times a week.

## 2016-03-04 NOTE — Patient Instructions (Signed)
Get your blood work before you leave   NEXT visit in 3 months  Fall Prevention and Home Safety Falls cause injuries and can affect all age groups. It is possible to use preventive measures to significantly decrease the likelihood of falls. There are many simple measures which can make your home safer and prevent falls. OUTDOORS  Repair cracks and edges of walkways and driveways.  Remove high doorway thresholds.  Trim shrubbery on the main path into your home.  Have good outside lighting.  Clear walkways of tools, rocks, debris, and clutter.  Check that handrails are not broken and are securely fastened. Both sides of steps should have handrails.  Have leaves, snow, and ice cleared regularly.  Use sand or salt on walkways during winter months.  In the garage, clean up grease or oil spills. BATHROOM  Install night lights.  Install grab bars by the toilet and in the tub and shower.  Use non-skid mats or decals in the tub or shower.  Place a plastic non-slip stool in the shower to sit on, if needed.  Keep floors dry and clean up all water on the floor immediately.  Remove soap buildup in the tub or shower on a regular basis.  Secure bath mats with non-slip, double-sided rug tape.  Remove throw rugs and tripping hazards from the floors. BEDROOMS  Install night lights.  Make sure a bedside light is easy to reach.  Do not use oversized bedding.  Keep a telephone by your bedside.  Have a firm chair with side arms to use for getting dressed.  Remove throw rugs and tripping hazards from the floor. KITCHEN  Keep handles on pots and pans turned toward the center of the stove. Use back burners when possible.  Clean up spills quickly and allow time for drying.  Avoid walking on wet floors.  Avoid hot utensils and knives.  Position shelves so they are not too high or low.  Place commonly used objects within easy reach.  If necessary, use a sturdy step stool with  a grab bar when reaching.  Keep electrical cables out of the way.  Do not use floor polish or wax that makes floors slippery. If you must use wax, use non-skid floor wax.  Remove throw rugs and tripping hazards from the floor. STAIRWAYS  Never leave objects on stairs.  Place handrails on both sides of stairways and use them. Fix any loose handrails. Make sure handrails on both sides of the stairways are as long as the stairs.  Check carpeting to make sure it is firmly attached along stairs. Make repairs to worn or loose carpet promptly.  Avoid placing throw rugs at the top or bottom of stairways, or properly secure the rug with carpet tape to prevent slippage. Get rid of throw rugs, if possible.  Have an electrician put in a light switch at the top and bottom of the stairs. OTHER FALL PREVENTION TIPS  Wear low-heel or rubber-soled shoes that are supportive and fit well. Wear closed toe shoes.  When using a stepladder, make sure it is fully opened and both spreaders are firmly locked. Do not climb a closed stepladder.  Add color or contrast paint or tape to grab bars and handrails in your home. Place contrasting color strips on first and last steps.  Learn and use mobility aids as needed. Install an electrical emergency response system.  Turn on lights to avoid dark areas. Replace light bulbs that burn out immediately. Get light switches  light switches that glow.  Arrange furniture to create clear pathways. Keep furniture in the same place.  Firmly attach carpet with non-skid or double-sided tape.  Eliminate uneven floor surfaces.  Select a carpet pattern that does not visually hide the edge of steps.  Be aware of all pets. OTHER HOME SAFETY TIPS  Set the water temperature for 120 F (48.8 C).  Keep emergency numbers on or near the telephone.  Keep smoke detectors on every level of the home and near sleeping areas. Document Released: 07/03/2002 Document Revised: 01/12/2012  Document Reviewed: 10/02/2011 Exit[employer] Patient Information 2015 [employer]. This information is not intended to replace advice given to you by your health care provider. Make sure you discuss any questions you have with your health care provider.   Preventive Care for Adults Ages 65 and over  Blood pressure check.** / Every 1 to 2 years.  Lipid and cholesterol check.**/ Every 5 years beginning at age 20.  Lung cancer screening. / Every year if you are aged 55-80 years and have a 30-pack-year history of smoking and currently smoke or have quit within the past 15 years. Yearly screening is stopped once you have quit smoking for at least 15 years or develop a health problem that would prevent you from having lung cancer treatment.  Fecal occult blood test (FOBT) of stool. / Every year beginning at age 50 and continuing until age 75. You may not have to do this test if you get a colonoscopy every 10 years.  Flexible sigmoidoscopy** or colonoscopy.** / Every 5 years for a flexible sigmoidoscopy or every 10 years for a colonoscopy beginning at age 50 and continuing until age 75.  Hepatitis C blood test.** / For all people born from 1945 through 1965 and any individual with known risks for hepatitis C.  Abdominal aortic aneurysm (AAA) screening.** / A one-time screening for ages 65 to 75 years who are current or former smokers.  Skin self-exam. / Monthly.  Influenza vaccine. / Every year.  Tetanus, diphtheria, and acellular pertussis (Tdap/Td) vaccine.** / 1 dose of Td every 10 years.  Varicella vaccine.** / Consult your health care provider.  Zoster vaccine.** / 1 dose for adults aged 60 years or older.  Pneumococcal 13-valent conjugate (PCV13) vaccine.** / Consult your health care provider.  Pneumococcal polysaccharide (PPSV23) vaccine.** / 1 dose for all adults aged 65 years and older.  Meningococcal vaccine.** / Consult your health care provider.  Hepatitis A vaccine.** /  Consult your health care provider.  Hepatitis B vaccine.** / Consult your health care provider.  Haemophilus influenzae type b (Hib) vaccine.** / Consult your health care provider. **Family history and personal history of risk and conditions may change your health care provider's recommendations. Document Released: 09/08/2001 Document Revised: 07/18/2013 Document Reviewed: 12/08/2010 Exit[employer] Patient Information 2015 [employer]. This information is not intended to replace advice given to you by your health care provider. Make sure you discuss any questions you have with your health care provider.   

## 2016-03-04 NOTE — Progress Notes (Signed)
Pre visit review using our clinic review tool, if applicable. No additional management support is needed unless otherwise documented below in the visit note. 
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
## 2016-03-10 ENCOUNTER — Other Ambulatory Visit: Payer: Self-pay | Admitting: Internal Medicine

## 2016-03-10 ENCOUNTER — Encounter: Payer: Self-pay | Admitting: Internal Medicine

## 2016-03-11 ENCOUNTER — Encounter: Payer: Self-pay | Admitting: Internal Medicine

## 2016-03-21 ENCOUNTER — Other Ambulatory Visit: Payer: Self-pay

## 2016-04-01 ENCOUNTER — Other Ambulatory Visit: Payer: Self-pay | Admitting: Internal Medicine

## 2016-04-01 ENCOUNTER — Encounter: Payer: Self-pay | Admitting: Internal Medicine

## 2016-04-03 ENCOUNTER — Other Ambulatory Visit (INDEPENDENT_AMBULATORY_CARE_PROVIDER_SITE_OTHER): Payer: Medicare Other

## 2016-04-03 DIAGNOSIS — Z0000 Encounter for general adult medical examination without abnormal findings: Secondary | ICD-10-CM | POA: Diagnosis not present

## 2016-04-03 LAB — FECAL OCCULT BLOOD, IMMUNOCHEMICAL: FECAL OCCULT BLD: NEGATIVE

## 2016-04-08 ENCOUNTER — Encounter: Payer: Self-pay | Admitting: Internal Medicine

## 2016-04-08 MED ORDER — PROPRANOLOL HCL 10 MG PO TABS: 20.0000 mg | ORAL_TABLET | Freq: Two times a day (BID) | ORAL | 5 refills | Status: DC

## 2016-04-09 ENCOUNTER — Encounter: Payer: Self-pay | Admitting: Internal Medicine

## 2016-05-10 ENCOUNTER — Other Ambulatory Visit: Payer: Self-pay | Admitting: Internal Medicine

## 2016-05-25 LAB — HM PAP SMEAR

## 2016-05-27 ENCOUNTER — Other Ambulatory Visit: Payer: Self-pay | Admitting: Internal Medicine

## 2016-05-28 ENCOUNTER — Other Ambulatory Visit: Payer: Self-pay | Admitting: Internal Medicine

## 2016-05-29 ENCOUNTER — Ambulatory Visit (INDEPENDENT_AMBULATORY_CARE_PROVIDER_SITE_OTHER): Payer: Medicare Other | Admitting: Physician Assistant

## 2016-05-29 ENCOUNTER — Other Ambulatory Visit: Payer: Self-pay | Admitting: Obstetrics and Gynecology

## 2016-05-29 VITALS — BP 148/88 | HR 112 | Temp 98.0°F | Ht 61.0 in | Wt 147.4 lb

## 2016-05-29 DIAGNOSIS — R928 Other abnormal and inconclusive findings on diagnostic imaging of breast: Secondary | ICD-10-CM

## 2016-05-29 DIAGNOSIS — M62838 Other muscle spasm: Secondary | ICD-10-CM | POA: Diagnosis not present

## 2016-05-29 MED ORDER — TRAMADOL HCL 50 MG PO TABS: 50.0000 mg | ORAL_TABLET | Freq: Three times a day (TID) | ORAL | 0 refills | Status: DC | PRN

## 2016-05-29 MED ORDER — KETOROLAC TROMETHAMINE 60 MG/2ML IM SOLN
60.0000 mg | Freq: Once | INTRAMUSCULAR | Status: AC
  Administered 2016-05-29: 60 mg via INTRAMUSCULAR

## 2016-05-29 MED ORDER — METAXALONE 400 MG PO TABS: 400.0000 mg | ORAL_TABLET | Freq: Three times a day (TID) | ORAL | 0 refills | Status: DC

## 2016-05-29 NOTE — Progress Notes (Signed)
Pre visit review using our clinic tool,if applicable. No additional management support is needed unless otherwise documented below in the visit note.  

## 2016-05-29 NOTE — Patient Instructions (Signed)
Please take Tramadol as directed for pain.  Once improving, you can switch back to Mobic.  Stop the Cyclobenzaprine and start the Skelaxin. No heavy lifting. Alternate ice and heat to the area. Topical Icy Hot.  If symptoms worsen over the weekend, please go to the ER. Otherwise follow-up next Tuesday or Wednesday.
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
## 2016-05-31 ENCOUNTER — Other Ambulatory Visit: Payer: Self-pay | Admitting: Internal Medicine

## 2016-06-01 MED ORDER — BACLOFEN 10 MG PO TABS: 10.0000 mg | ORAL_TABLET | Freq: Two times a day (BID) | ORAL | 0 refills | Status: DC

## 2016-06-03 ENCOUNTER — Ambulatory Visit
Admission: RE | Admit: 2016-06-03 | Discharge: 2016-06-03 | Disposition: A | Discharge status: Home / Self Care | Payer: Medicare Other | Source: Ambulatory Visit | Attending: Obstetrics and Gynecology | Admitting: Obstetrics and Gynecology

## 2016-06-03 DIAGNOSIS — R928 Other abnormal and inconclusive findings on diagnostic imaging of breast: Secondary | ICD-10-CM

## 2016-06-04 ENCOUNTER — Encounter: Payer: Self-pay | Admitting: Internal Medicine

## 2016-06-04 ENCOUNTER — Ambulatory Visit (INDEPENDENT_AMBULATORY_CARE_PROVIDER_SITE_OTHER): Payer: Medicare Other | Admitting: Internal Medicine

## 2016-06-04 VITALS — BP 126/68 | HR 102 | Temp 97.5°F | Resp 14 | Ht 61.0 in | Wt 148.4 lb

## 2016-06-04 DIAGNOSIS — E1365 Other specified diabetes mellitus with hyperglycemia: Secondary | ICD-10-CM

## 2016-06-04 DIAGNOSIS — M62838 Other muscle spasm: Secondary | ICD-10-CM

## 2016-06-04 DIAGNOSIS — IMO0002 Reserved for concepts with insufficient information to code with codable children: Secondary | ICD-10-CM

## 2016-06-04 DIAGNOSIS — Z23 Encounter for immunization: Secondary | ICD-10-CM | POA: Diagnosis not present

## 2016-06-04 NOTE — Patient Instructions (Addendum)
Take your medications every days as recommended    Diabetes: Check your blood sugar  once a day     at different times of the day  GOALS: Fasting before a meal 70- 130 2 hours after a meal less than 180 At bedtime 90-150 Call if consistently not at goal   Next visit in 3 months  If the swelling in your face is not completely gone in one week please let me know

## 2016-06-04 NOTE — Progress Notes (Signed)
Pre visit review using our clinic review tool, if applicable. No additional management support is needed unless otherwise documented below in the visit note. 
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
## 2016-06-05 MED ORDER — BLOOD GLUCOSE TEST VI STRP: ORAL_STRIP | 12 refills | Status: DC

## 2016-06-05 MED ORDER — ONETOUCH ULTRASOFT LANCETS MISC: 12 refills | Status: DC

## 2016-06-05 NOTE — Assessment & Plan Note (Signed)
DM: Not taking Januvia, metformin or a glimepiride consistently.When she does take her medication, CBGs look good per husband;  A1c 3 months ago was 12.2. I don't see the need to repeat A1c today, recommend to take Januvia-metformin-glimepiride consistently. For now will not do invokana, think she would be ok if comply w/ other meds. Offered endo referral. Again long-term risk of uncontrolled diabetes discussed. Aspirin: Discontinued due to bleeding ; patient likes to try taking it 2 or 3 times a week Neck pain: Resolved Resolving parotitis? Right parotid exam is abnormal, it was worse 3 days ago, recommend self-monitoring, if not back to normal in one week to let me know Leg pain: See previous visit, sx resolved Flu shot today RTC 3 months

## 2016-08-19 ENCOUNTER — Other Ambulatory Visit: Payer: Self-pay | Admitting: Internal Medicine

## 2016-09-01 DIAGNOSIS — E119 Type 2 diabetes mellitus without complications: Secondary | ICD-10-CM | POA: Diagnosis not present

## 2016-09-01 LAB — HM DIABETES EYE EXAM

## 2016-09-03 ENCOUNTER — Ambulatory Visit (INDEPENDENT_AMBULATORY_CARE_PROVIDER_SITE_OTHER): Payer: Medicare Other | Admitting: Internal Medicine

## 2016-09-03 ENCOUNTER — Encounter: Payer: Self-pay | Admitting: Internal Medicine

## 2016-09-03 VITALS — BP 132/78 | HR 92 | Temp 98.0°F | Resp 14 | Ht 61.0 in | Wt 143.4 lb

## 2016-09-03 DIAGNOSIS — E1365 Other specified diabetes mellitus with hyperglycemia: Secondary | ICD-10-CM

## 2016-09-03 DIAGNOSIS — I10 Essential (primary) hypertension: Secondary | ICD-10-CM

## 2016-09-03 DIAGNOSIS — Z114 Encounter for screening for human immunodeficiency virus [HIV]: Secondary | ICD-10-CM

## 2016-09-03 DIAGNOSIS — IMO0002 Reserved for concepts with insufficient information to code with codable children: Secondary | ICD-10-CM

## 2016-09-03 DIAGNOSIS — J40 Bronchitis, not specified as acute or chronic: Secondary | ICD-10-CM | POA: Diagnosis not present

## 2016-09-03 DIAGNOSIS — Z1159 Encounter for screening for other viral diseases: Secondary | ICD-10-CM

## 2016-09-03 LAB — BASIC METABOLIC PANEL
BUN: 19 mg/dL (ref 6–23)
CALCIUM: 9.9 mg/dL (ref 8.4–10.5)
CO2: 33 mEq/L — ABNORMAL HIGH (ref 19–32)
Chloride: 92 mEq/L — ABNORMAL LOW (ref 96–112)
Creatinine, Ser: 0.61 mg/dL (ref 0.40–1.20)
GFR: 107.65 mL/min (ref >60.00)
GLUCOSE: 405 mg/dL — AB (ref 70–99)
Potassium: 4.8 mEq/L (ref 3.5–5.1)
Sodium: 132 mEq/L — ABNORMAL LOW (ref 135–145)

## 2016-09-03 LAB — HEMOGLOBIN A1C: HEMOGLOBIN A1C: 11.9 % — AB (ref 4.6–6.5)

## 2016-09-03 MED ORDER — ALBUTEROL SULFATE HFA 108 (90 BASE) MCG/ACT IN AERS: 2.0000 | INHALATION_SPRAY | Freq: Four times a day (QID) | RESPIRATORY_TRACT | 1 refills | Status: DC | PRN

## 2016-09-03 MED ORDER — BENZONATATE 200 MG PO CAPS: 200.0000 mg | ORAL_CAPSULE | Freq: Three times a day (TID) | ORAL | 0 refills | Status: DC | PRN

## 2016-09-03 NOTE — Progress Notes (Signed)
Pre visit review using our clinic review tool, if applicable. No additional management support is needed unless otherwise documented below in the visit note. 
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
## 2016-09-03 NOTE — Patient Instructions (Addendum)
GO TO THE LAB : Get the blood work     GO TO THE FRONT DESK Schedule your next appointment for a  routine checkup in 3 months   Rest, fluids , tylenol  For cough:  Take Mucinex DM twice a day as needed until better You can also use Tessalon Perles 3 times a day If you hear wheezing, chest congestion and persisting cough: Use albuterol  For nasal congestion: Use OTC Nasocort or Flonase : 2 nasal sprays on each side of the nose in the morning until you feel better   Avoid decongestants such as  Pseudoephedrine or phenylephrine    Call if not gradually better over the next  10 days  Call anytime if the symptoms are severe, you have high fever, short of breath, chest pain

## 2016-09-04 LAB — HEPATITIS C ANTIBODY: HCV Ab: NEGATIVE

## 2016-09-04 LAB — HIV ANTIBODY (ROUTINE TESTING W REFLEX): HIV: NONREACTIVE

## 2016-09-04 NOTE — Assessment & Plan Note (Signed)
DM: Reports good compliance with Januvia, metformin and glimepiride. Check A1c HTN: Good med compliance, check a BMP Cough: Cough, respiratory sxs for the last 3 days. Nontoxic appearing, afebrile today. Some wheezing. Likely has bronchitis/bronchospasm. Recommend Mucinex, Tessalon Perles and albuterol. Call if not improving. Primary care: Check HIV and hep C RTC 3 months.

## 2016-09-07 NOTE — Addendum Note (Signed)
Addended [place]Conrad [place]: Taylor Spilde D on: 09/07/2016 09:40 AM   Modules accepted: Orders

## 2016-09-22 ENCOUNTER — Encounter: Payer: Self-pay | Admitting: Internal Medicine

## 2016-09-24 ENCOUNTER — Other Ambulatory Visit: Payer: Self-pay | Admitting: Physician Assistant

## 2016-10-01 ENCOUNTER — Other Ambulatory Visit: Payer: Self-pay | Admitting: Physician Assistant

## 2016-10-05 ENCOUNTER — Ambulatory Visit: Payer: Medicare Other | Admitting: Endocrinology

## 2016-11-05 ENCOUNTER — Other Ambulatory Visit: Payer: Self-pay | Admitting: Internal Medicine

## 2016-11-21 ENCOUNTER — Other Ambulatory Visit: Payer: Self-pay | Admitting: Internal Medicine

## 2016-12-01 ENCOUNTER — Encounter: Payer: Self-pay | Admitting: Internal Medicine

## 2016-12-01 ENCOUNTER — Ambulatory Visit (INDEPENDENT_AMBULATORY_CARE_PROVIDER_SITE_OTHER): Payer: Medicare Other | Admitting: Internal Medicine

## 2016-12-01 VITALS — BP 118/70 | HR 85 | Temp 97.8°F | Ht 61.0 in | Wt 146.1 lb

## 2016-12-01 DIAGNOSIS — E1365 Other specified diabetes mellitus with hyperglycemia: Secondary | ICD-10-CM | POA: Diagnosis not present

## 2016-12-01 DIAGNOSIS — IMO0002 Reserved for concepts with insufficient information to code with codable children: Secondary | ICD-10-CM

## 2016-12-01 MED ORDER — BLOOD GLUCOSE TEST VI STRP: ORAL_STRIP | 12 refills | Status: DC

## 2016-12-01 MED ORDER — ONETOUCH ULTRASOFT LANCETS MISC: 12 refills | Status: DC
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
## 2016-12-01 NOTE — Progress Notes (Signed)
Pre visit review using our clinic review tool, if applicable. No additional management support is needed unless otherwise documented below in the visit note. 

## 2016-12-01 NOTE — Patient Instructions (Addendum)
  GO TO THE FRONT DESK Schedule your next appointment for a  Check up in 3 months   Take a walk, 30 minutes a day  Take the medicines as prescribed every day   Let your husband help with compliance  Diabetes: Check your blood sugar  once a day   3-4 times a week  Check your blood sugar  at different times of the day  GOALS: Fasting before a meal 70- 130 2 hours after a meal less than 180 At bedtime 90-150 Call if consistently not at goal

## 2016-12-02 NOTE — Assessment & Plan Note (Signed)
DM: Last A1c 11.9, pt's husband reports a very poor compliance with medication, she gets somewhat upset when he reminds her to take there meds. States that when she takes her medications regularly, CBGs run in the low 120s. Explained the patient the short and long-term risk of diabetes, encourage better control. Dietary advice provided, she has an issue with food texture, can't take anything crunchy, recommend to try vegetables in the form of soup. Also avoid sugary drinks like Gatorade. CBG goals discussed, see instructions. Foot exam negative. 20 minutes spent with the patient RTC 3 months

## 2016-12-14 ENCOUNTER — Other Ambulatory Visit: Payer: Self-pay | Admitting: Internal Medicine

## 2016-12-25 ENCOUNTER — Other Ambulatory Visit: Payer: Self-pay | Admitting: Internal Medicine

## 2016-12-28 ENCOUNTER — Encounter: Payer: Self-pay | Admitting: Internal Medicine

## 2017-01-03 ENCOUNTER — Other Ambulatory Visit: Payer: Self-pay | Admitting: Internal Medicine

## 2017-01-07 ENCOUNTER — Other Ambulatory Visit: Payer: Self-pay | Admitting: Internal Medicine

## 2017-01-07 ENCOUNTER — Encounter: Payer: Self-pay | Admitting: Internal Medicine

## 2017-01-25 ENCOUNTER — Other Ambulatory Visit: Payer: Self-pay | Admitting: Internal Medicine

## 2017-02-01 ENCOUNTER — Other Ambulatory Visit: Payer: Self-pay | Admitting: Internal Medicine

## 2017-03-08 ENCOUNTER — Encounter: Payer: Self-pay | Admitting: Internal Medicine

## 2017-03-08 ENCOUNTER — Ambulatory Visit (INDEPENDENT_AMBULATORY_CARE_PROVIDER_SITE_OTHER): Payer: Medicare Other | Admitting: Internal Medicine

## 2017-03-08 VITALS — BP 124/70 | HR 96 | Temp 97.7°F | Resp 14 | Ht 61.0 in | Wt 147.2 lb

## 2017-03-08 DIAGNOSIS — E1365 Other specified diabetes mellitus with hyperglycemia: Secondary | ICD-10-CM | POA: Diagnosis not present

## 2017-03-08 DIAGNOSIS — K580 Irritable bowel syndrome with diarrhea: Secondary | ICD-10-CM | POA: Diagnosis not present

## 2017-03-08 DIAGNOSIS — E785 Hyperlipidemia, unspecified: Secondary | ICD-10-CM | POA: Diagnosis not present

## 2017-03-08 DIAGNOSIS — IMO0002 Reserved for concepts with insufficient information to code with codable children: Secondary | ICD-10-CM

## 2017-03-08 DIAGNOSIS — I10 Essential (primary) hypertension: Secondary | ICD-10-CM

## 2017-03-08 NOTE — Patient Instructions (Signed)
  GO TO THE FRONT DESK -Schedule your next appointment for a  yearly checkup in 3-4 months. -Schedule labs to be done  this week, fasting   IBS: Avoid lactulose  Metamucil 2 or 3 capsules daily with breakfast  Align, a probiotic daily  Calcium supplements  =====  Consider Medicare one is one of the nurses

## 2017-03-08 NOTE — Progress Notes (Signed)
Pre visit review using our clinic review tool, if applicable. No additional management support is needed unless otherwise documented below in the visit note. 
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
## 2017-03-09 NOTE — Assessment & Plan Note (Signed)
DM: Since the last visit, compliance w/ meds has increased significantly, she is exercising more and when she does CBGs are excellent. Encouraged to continue exercising, sign into the [employer]. Will get a A1c, otherwise continue metformin and Januvia HTN: Currently on amlodipine, Inderal. Check a CMP High cholesterol: On Lipitor, check a FLP IBS-Diarrhea predominant Used to see Dr. Randa EvensEdwards, currently on OTC Imodium with but sx not well controlled. Rec  lactose avoidance, Metamucil, align, calcium supplements. Consider bile acid sequestrants. RTC 3-4 months, CPX.

## 2017-03-17 ENCOUNTER — Other Ambulatory Visit: Payer: Medicare Other

## 2017-03-18 NOTE — Progress Notes (Addendum)
Subjective:   Adrienne Bennett is a 56 y.o. female who presents for Medicare Annual (Subsequent) preventive examination.  Pt is pleasant. Reports she is currently in sewing class.  Review of Systems:  No ROS.  Medicare Wellness Visit. Additional risk factors are reflected in the social history.  Cardiac Risk Factors include: advanced age (>9men, >64 women);hypertension;sedentary lifestyle;dyslipidemia;diabetes mellitus Sleep patterns: Takes xanax to help her sleep. Home Safety/Smoke Alarms: Feels safe in home. Smoke alarms in place.  Living environment; residence and Firearm Safety: Lives with husband and cat. 2 story.  Seat Belt Safety/Bike Helmet: Wears seat belt.   Female:   Pap- [facility]. Dr.Adkins. Yearly. Pt reports last normal.      Mammo- Last 06/03/16.BI-RADS CATEGORY  2: Benign.           CCS-Last 08/04/10    Objective:     Vitals: BP 138/70 (BP Location: Left Arm, Patient Position: Sitting, Cuff Size: Normal)   Pulse 82   Ht 5\' 1"  (1.549 m)   Wt 143 lb (64.9 kg)   SpO2 98%   BMI 27.02 kg/m   Body mass index is 27.02 kg/m.   Tobacco History  Smoking Status  . Never Smoker  Smokeless Tobacco  . Never Used     Counseling given: Not Answered   Past Medical History:  Diagnosis Date  . 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 Chronic diarrhea of unknown origin    "nearly qd" (09/01/2013)  . Depression    sess Dr.Plovsky  . High cholesterol   . Hypertension   . IBS (irritable bowel syndrome)    ? of IBS  . Migraine    "long long time ago; maybe 20 yr ago" (09/01/2013)  . Osteoarthritis    "back; goes down my right leg" (09/01/2013)  . Type II diabetes mellitus (HCC) 2007   Past Surgical History:  Procedure Laterality Date  . CARPAL TUNNEL RELEASE Bilateral ~ 2000  . TONSILLECTOMY  1970  . TUBAL LIGATION  1996   Family  History  Problem Relation Age of Onset  . Diabetes Mother   . Diabetes Sister   . CAD Sister   . Hypertension Sister   . Diabetes Maternal Grandmother   . Coronary artery disease Father        age? 50s?  Marland Kitchen Coronary artery disease Brother        age?, 72s?  Marland Kitchen Hypertension Brother   . Colon cancer Neg Hx   . Breast cancer Neg Hx    History  Sexual Activity  . Sexual activity: Yes    Outpatient Encounter Prescriptions as of 03/19/2017  Medication Sig  . ALPRAZolam (XANAX) 1 MG tablet Take 1-2 mg by mouth 2 (two) times daily. Takes 1mg  in the morning and 2mg  in the evening  . amLODipine (NORVASC) 10 MG tablet Take 1 tablet (10 mg total) by mouth daily.  Marland Kitchen aspirin EC 81 MG tablet Take 81 mg by mouth daily.  Marland Kitchen atorvastatin (LIPITOR) 20 MG tablet Take 1 tablet (20 mg total) by mouth daily.  Marland Kitchen buPROPion (WELLBUTRIN XL) 150 MG 24 hr tablet Take 300 mg by mouth every morning.  . cyclobenzaprine (FLEXERIL) 10 MG tablet Take 1 tablet (10 mg total) by mouth 2 (two) times daily as needed for muscle spasms.  . divalproex (DEPAKOTE) 250 MG DR tablet Take 250 mg by mouth. 1 tab AM,  2 tabs PM  . Glucose Blood (BLOOD GLUCOSE TEST STRIPS) STRP Check blood sugar no more than twice daily  . Lancets (ONETOUCH ULTRASOFT) lancets Check blood sugar no more than twice daily.  . meloxicam (MOBIC) 7.5 MG tablet Take 1 tablet (7.5 mg total) by mouth daily as needed for pain.  . metFORMIN (GLUCOPHAGE) 500 MG tablet Take 2 tablets (1,000 mg total) by mouth 2 (two) times daily with a meal.  . oxybutynin (DITROPAN-XL) 5 MG 24 hr tablet Take 5 mg by mouth daily.  . propranolol (INDERAL) 10 MG tablet Take 2 tablets (20 mg total) by mouth 2 (two) times daily.  . sitaGLIPtin (JANUVIA) 100 MG tablet Take 1 tablet (100 mg total) by mouth daily.   No facility-administered encounter medications on file as of 03/19/2017.     Activities of Daily Living In your present state of health, do you have any difficulty  performing the following activities: 03/19/2017 03/08/2017  Hearing? N N  Vision? N N  Comment [facility] Dr Martha Clan yearly -  Difficulty concentrating or making decisions? N N  Walking or climbing stairs? N N  Dressing or bathing? N N  Doing errands, shopping? N N  Preparing Food and eating ? N -  Using the Toilet? N -  In the past six months, have you accidently leaked urine? N -  Do you have problems with loss of bowel control? N -  Managing your Medications? N -  Managing your Finances? N -  Housekeeping or managing your Housekeeping? N -  Some recent data might be hidden    Patient Care Team: [name], MD as PCP - General Zelphia Cairo, MD as Consulting Physician (Obstetrics and Gynecology) Archer Asa, MD as Consulting Physician (Psychiatry) Evette Cristal, [facility] as Consulting Physician (Psychology)    Assessment:    Physical assessment deferred to PCP.  Exercise Activities and Dietary recommendations Current Exercise Habits: The patient does not participate in regular exercise at present, Exercise limited by: None identified   Diet (meal preparation, eat out, water intake, caffeinated beverages, dairy products, fruits and vegetables): in general, an "unhealthy" diet  Goals      Patient Stated   . Maintain Current health (pt-stated)      Fall Risk Fall Risk  03/19/2017 03/04/2016 03/14/2015 08/08/2013  Falls in the past year? No No No No   Depression Screen PHQ 2/9 Scores 03/19/2017 03/04/2016 03/14/2015 08/08/2013  PHQ - 2 Score 0 0 0 0     Cognitive Function Ad8 score reviewed for issues:  Issues making decisions:no  Less interest in hobbies / activities:no  Repeats questions, stories (family complaining):no  Trouble using ordinary gadgets (microwave, computer, phone):no  Forgets the month or year: no  Mismanaging finances: no  Remembering appts:no  Daily problems with thinking and/or memory:no Ad8 score is=0        Immunization History    Administered Date(s) Administered  . Influenza Split 07/08/2011, 04/22/2012  . Influenza Whole 05/08/2009, 05/06/2010  . Influenza,inj,Quad PF,6+ Mos 06/07/2013, 04/23/2014, 09/02/2015, 06/04/2016  . Pneumococcal Conjugate-13 12/04/2014  . Pneumococcal Polysaccharide-23 06/07/2009  . Tdap 07/13/2012   Screening Tests Health Maintenance  Topic Date Due  . PAP SMEAR  12/25/2016  . HEMOGLOBIN A1C  03/03/2017  . INFLUENZA VACCINE  03/27/2017 (Originally 02/24/2017)  . MAMMOGRAM  06/03/2017  . OPHTHALMOLOGY EXAM  08/27/2017  . FOOT EXAM  12/01/2017  . COLONOSCOPY  08/14/2020  . TETANUS/TDAP  07/13/2022  . PNEUMOCOCCAL POLYSACCHARIDE VACCINE  Completed  . Hepatitis C Screening  Completed  . HIV Screening  Completed      Plan:   Follow up with Dr.Paz as scheduled 06/08/17.   Continue to eat heart healthy diet (full of fruits, vegetables, whole grains, lean protein, water--limit salt, fat, and sugar intake) and increase physical activity as tolerated.  Continue doing brain stimulating activities (puzzles, reading, adult coloring books, staying active) to keep memory sharp.    I have personally reviewed and noted the following in the patient's chart:   . Medical and social history . Use of alcohol, tobacco or illicit drugs  . Current medications and supplements . Functional ability and status . Nutritional status . Physical activity . Advanced directives . List of other physicians . Hospitalizations, surgeries, and ER visits in previous 12 months . Vitals . Screenings to include cognitive, depression, and falls . Referrals and appointments  In addition, I have reviewed and discussed with patient certain preventive protocols, quality metrics, and best practice recommendations. A written personalized care plan for preventive services as well as general preventive health recommendations were provided to patient.     [place]on Gully, [place]  03/19/2017   Noted. Agree with  above. Jilda Roche [place], DO

## 2017-03-19 ENCOUNTER — Ambulatory Visit (INDEPENDENT_AMBULATORY_CARE_PROVIDER_SITE_OTHER): Payer: Medicare Other | Admitting: *Deleted

## 2017-03-19 ENCOUNTER — Encounter: Payer: Self-pay | Admitting: *Deleted

## 2017-03-19 VITALS — BP 138/70 | HR 82 | Ht 61.0 in | Wt 143.0 lb

## 2017-03-19 DIAGNOSIS — Z0000 Encounter for general adult medical examination without abnormal findings: Secondary | ICD-10-CM

## 2017-03-19 NOTE — Patient Instructions (Addendum)
Adrienne Bennett , Thank you for taking time to come for your Medicare Wellness Visit. I appreciate your ongoing commitment to your health goals. Please review the following plan we discussed and let me know if I can assist you in the future.   These are the goals we discussed: Goals      Patient Stated   . Maintain Current health (pt-stated)       This is a list of the screening recommended for you and due dates:  Health Maintenance  Topic Date Due  . Pap Smear  12/25/2016  . Hemoglobin A1C  03/03/2017  . Flu Shot  03/27/2017*  . Mammogram  06/03/2017  . Eye exam for diabetics  08/27/2017  . Complete foot exam   12/01/2017  . Colon Cancer Screening  08/14/2020  . Tetanus Vaccine  07/13/2022  . Pneumococcal vaccine  Completed  .  Hepatitis C: One time screening is recommended by Center for Disease Control  (CDC) for  adults born from [birth year] through 1966.   Completed  . HIV Screening  Completed  *Topic was postponed. The date shown is not the original due date.    Follow up with Dr.Paz as scheduled 06/08/17.   Continue to eat heart healthy diet (full of fruits, vegetables, whole grains, lean protein, water--limit salt, fat, and sugar intake) and increase physical activity as tolerated.  Continue doing brain stimulating activities (puzzles, reading, adult coloring books, staying active) to keep memory sharp.    Health Maintenance, Female Adopting a healthy lifestyle and getting preventive care can go a long way to promote health and wellness. Talk with your health care provider about what schedule of regular examinations is right for you. This is a good chance for you to check in with your provider about disease prevention and staying healthy. In between checkups, there are plenty of things you can do on your own. Experts have done a lot of research about which lifestyle changes and preventive measures are most likely to keep you healthy. Ask your health care provider for more  information. Weight and diet Eat a healthy diet  Be sure to include plenty of vegetables, fruits, low-fat dairy products, and lean protein.  Do not eat a lot of foods high in solid fats, added sugars, or salt.  Get regular exercise. This is one of the most important things you can do for your health. ? Most adults should exercise for at least 150 minutes each week. The exercise should increase your heart rate and make you sweat (moderate-intensity exercise). ? Most adults should also do strengthening exercises at least twice a week. This is in addition to the moderate-intensity exercise.  Maintain a healthy weight  Body mass index (BMI) is a measurement that can be used to identify possible weight problems. It estimates body fat based on height and weight. Your health care provider can help determine your BMI and help you achieve or maintain a healthy weight.  For females 28 years of age and older: ? A BMI below 18.5 is considered underweight. ? A BMI of 18.5 to 24.9 is normal. ? A BMI of 25 to 29.9 is considered overweight. ? A BMI of 30 and above is considered obese.  Watch levels of cholesterol and blood lipids  You should start having your blood tested for lipids and cholesterol at 57 years of age, then have this test every 5 years.  You may need to have your cholesterol levels checked more often if: ? Your lipid  or cholesterol levels are high. ? You are older than 57 years of age. ? You are at high risk for heart disease.  Cancer screening Lung Cancer  Lung cancer screening is recommended for adults 31-69 years old who are at high risk for lung cancer because of a history of smoking.  A yearly low-dose CT scan of the lungs is recommended for people who: ? Currently smoke. ? Have quit within the past 15 years. ? Have at least a 30-pack-year history of smoking. A pack year is smoking an average of one pack of cigarettes a day for 1 year.  Yearly screening should continue  until it has been 15 years since you quit.  Yearly screening should stop if you develop a health problem that would prevent you from having lung cancer treatment.  Breast Cancer  Practice breast self-awareness. This means understanding how your breasts normally appear and feel.  It also means doing regular breast self-exams. Let your health care provider know about any changes, no matter how small.  If you are in your 20s or 30s, you should have a clinical breast exam (CBE) by a health care provider every 1-3 years as part of a regular health exam.  If you are 61 or older, have a CBE every year. Also consider having a breast X-ray (mammogram) every year.  If you have a family history of breast cancer, talk to your health care provider about genetic screening.  If you are at high risk for breast cancer, talk to your health care provider about having an MRI and a mammogram every year.  Breast cancer gene (BRCA) assessment is recommended for women who have family members with BRCA-related cancers. BRCA-related cancers include: ? Breast. ? Ovarian. ? Tubal. ? Peritoneal cancers.  Results of the assessment will determine the need for genetic counseling and BRCA1 and BRCA2 testing.  Cervical Cancer Your health care provider may recommend that you be screened regularly for cancer of the pelvic organs (ovaries, uterus, and vagina). This screening involves a pelvic examination, including checking for microscopic changes to the surface of your cervix (Pap test). You may be encouraged to have this screening done every 3 years, beginning at age 71.  For women ages 41-65, health care providers may recommend pelvic exams and Pap testing every 3 years, or they may recommend the Pap and pelvic exam, combined with testing for human papilloma virus (HPV), every 5 years. Some types of HPV increase your risk of cervical cancer. Testing for HPV may also be done on women of any age with unclear Pap test  results.  Other health care providers may not recommend any screening for nonpregnant women who are considered low risk for pelvic cancer and who do not have symptoms. Ask your health care provider if a screening pelvic exam is right for you.  If you have had past treatment for cervical cancer or a condition that could lead to cancer, you need Pap tests and screening for cancer for at least 20 years after your treatment. If Pap tests have been discontinued, your risk factors (such as having a new sexual partner) need to be reassessed to determine if screening should resume. Some women have medical problems that increase the chance of getting cervical cancer. In these cases, your health care provider may recommend more frequent screening and Pap tests.  Colorectal Cancer  This type of cancer can be detected and often prevented.  Routine colorectal cancer screening usually begins at 57 years of age  and continues through 57 years of age.  Your health care provider may recommend screening at an earlier age if you have risk factors for colon cancer.  Your health care provider may also recommend using home test kits to check for hidden blood in the stool.  A small camera at the end of a tube can be used to examine your colon directly (sigmoidoscopy or colonoscopy). This is done to check for the earliest forms of colorectal cancer.  Routine screening usually begins at age 31.  Direct examination of the colon should be repeated every 5-10 years through 57 years of age. However, you may need to be screened more often if early forms of precancerous polyps or small growths are found.  Skin Cancer  Check your skin from head to toe regularly.  Tell your health care provider about any new moles or changes in moles, especially if there is a change in a mole's shape or color.  Also tell your health care provider if you have a mole that is larger than the size of a pencil eraser.  Always use sunscreen.  Apply sunscreen liberally and repeatedly throughout the day.  Protect yourself by wearing long sleeves, pants, a wide-brimmed hat, and sunglasses whenever you are outside.  Heart disease, diabetes, and high blood pressure  High blood pressure causes heart disease and increases the risk of stroke. High blood pressure is more likely to develop in: ? People who have blood pressure in the high end of the normal range (130-139/85-89 mm Hg). ? People who are overweight or obese. ? People who are African American.  If you are 50-53 years of age, have your blood pressure checked every 3-5 years. If you are 42 years of age or older, have your blood pressure checked every year. You should have your blood pressure measured twice-once when you are at a hospital or clinic, and once when you are not at a hospital or clinic. Record the average of the two measurements. To check your blood pressure when you are not at a hospital or clinic, you can use: ? An automated blood pressure machine at a pharmacy. ? A home blood pressure monitor.  If you are between 107 years and 24 years old, ask your health care provider if you should take aspirin to prevent strokes.  Have regular diabetes screenings. This involves taking a blood sample to check your fasting blood sugar level. ? If you are at a normal weight and have a low risk for diabetes, have this test once every three years after 57 years of age. ? If you are overweight and have a high risk for diabetes, consider being tested at a younger age or more often. Preventing infection Hepatitis B  If you have a higher risk for hepatitis B, you should be screened for this virus. You are considered at high risk for hepatitis B if: ? You were born in a country where hepatitis B is common. Ask your health care provider which countries are considered high risk. ? Your parents were born in a high-risk country, and you have not been immunized against hepatitis B (hepatitis B  vaccine). ? You have HIV or AIDS. ? You use needles to inject street drugs. ? You live with someone who has hepatitis B. ? You have had sex with someone who has hepatitis B. ? You get hemodialysis treatment. ? You take certain medicines for conditions, including cancer, organ transplantation, and autoimmune conditions.  Hepatitis C  Blood testing is  recommended for: ? Everyone born from [birth year] through 1966. ? Anyone with known risk factors for hepatitis C.  Sexually transmitted infections (STIs)  You should be screened for sexually transmitted infections (STIs) including gonorrhea and chlamydia if: ? You are sexually active and are younger than 57 years of age. ? You are older than 57 years of age and your health care provider tells you that you are at risk for this type of infection. ? Your sexual activity has changed since you were last screened and you are at an increased risk for chlamydia or gonorrhea. Ask your health care provider if you are at risk.  If you do not have HIV, but are at risk, it may be recommended that you take a prescription medicine daily to prevent HIV infection. This is called pre-exposure prophylaxis (PrEP). You are considered at risk if: ? You are sexually active and do not regularly use condoms or know the HIV status of your partner(s). ? You take drugs by injection. ? You are sexually active with a partner who has HIV.  Talk with your health care provider about whether you are at high risk of being infected with HIV. If you choose to begin PrEP, you should first be tested for HIV. You should then be tested every 3 months for as long as you are taking PrEP. Pregnancy  If you are premenopausal and you may become pregnant, ask your health care provider about preconception counseling.  If you may become pregnant, take 400 to 800 micrograms (mcg) of folic acid every day.  If you want to prevent pregnancy, talk to your health care provider about birth control  (contraception). Osteoporosis and menopause  Osteoporosis is a disease in which the bones lose minerals and strength with aging. This can result in serious bone fractures. Your risk for osteoporosis can be identified using a bone density scan.  If you are 9 years of age or older, or if you are at risk for osteoporosis and fractures, ask your health care provider if you should be screened.  Ask your health care provider whether you should take a calcium or vitamin D supplement to lower your risk for osteoporosis.  Menopause may have certain physical symptoms and risks.  Hormone replacement therapy may reduce some of these symptoms and risks. Talk to your health care provider about whether hormone replacement therapy is right for you. Follow these instructions at home:  Schedule regular health, dental, and eye exams.  Stay current with your immunizations.  Do not use any tobacco products including cigarettes, chewing tobacco, or electronic cigarettes.  If you are pregnant, do not drink alcohol.  If you are breastfeeding, limit how much and how often you drink alcohol.  Limit alcohol intake to no more than 1 drink per day for nonpregnant women. One drink equals 12 ounces of beer, 5 ounces of wine, or 1 ounces of hard liquor.  Do not use street drugs.  Do not share needles.  Ask your health care provider for help if you need support or information about quitting drugs.  Tell your health care provider if you often feel depressed.  Tell your health care provider if you have ever been abused or do not feel safe at home. This information is not intended to replace advice given to you by your health care provider. Make sure you discuss any questions you have with your health care provider. Document Released: 01/26/2011 Document Revised: 12/19/2015 Document Reviewed: 04/16/2015 Elsevier Interactive Patient Education  2018  [employer].

## 2017-04-15 ENCOUNTER — Ambulatory Visit (INDEPENDENT_AMBULATORY_CARE_PROVIDER_SITE_OTHER): Payer: Medicare Other | Admitting: Family Medicine

## 2017-04-15 ENCOUNTER — Encounter: Payer: Self-pay | Admitting: Family Medicine

## 2017-04-15 VITALS — BP 128/94 | HR 87 | Temp 98.3°F | Ht 61.0 in | Wt 142.8 lb

## 2017-04-15 DIAGNOSIS — K1379 Other lesions of oral mucosa: Secondary | ICD-10-CM | POA: Diagnosis not present

## 2017-04-15 HISTORY — DX: Other lesions of oral mucosa: K13.79

## 2017-04-15 MED ORDER — CHLORHEXIDINE GLUCONATE 0.12 % MT SOLN: 15.0000 mL | Freq: Two times a day (BID) | OROMUCOSAL | 1 refills | Status: DC

## 2017-04-15 NOTE — Patient Instructions (Addendum)
Thank you for coming in,   Please call me with the type of mouth wash that they have sent in for you previously.   Please try getting sugar-free popsicles to help with the mouth pain.    Please feel free to call with any questions or concerns at any time, at [phone]. --Dr. Jordan Likes  Diet Recommendations for Diabetes   Starchy (carb) foods include: Bread, rice, pasta, potatoes, corn, crackers, bagels, muffins, all baked goods.   Protein foods include: Meat, fish, poultry, eggs, dairy foods, and beans such as pinto and kidney beans (beans also provide carbohydrate).   1. Eat at least 3 meals and 1-2 snacks per day. Never go more than 4-5 hours while awake without eating.  2. Limit starchy foods to TWO per meal and ONE per snack. ONE portion of a starchy  food is equal to the following:   - ONE slice of bread (or its equivalent, such as half of a hamburger bun).   - 1/2 cup of a "scoopable" starchy food such as potatoes or rice.   - 1 OUNCE (28 grams) of starchy snack foods such as crackers or pretzels (look on label).   - 15 grams of carbohydrate as shown on food label.  3. Both lunch and dinner should include a protein food, a carb food, and vegetables.   - Obtain twice as many veg's as protein or carbohydrate foods for both lunch and dinner.   - Try to keep frozen veg's on hand for a quick vegetable serving.     - Fresh or frozen veg's are best.  4. Breakfast should always include protein.
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
## 2017-04-15 NOTE — Assessment & Plan Note (Signed)
Did not see any specific source today. Unclear if this is due to dry mouth she has a hemoglobin of 11.9. She is noncompliant with some of her diabetic medications as well as her psychiatric medications. She reports the sores occur intermittently. She has been to the dentist to has provided today mouthwash previously. May be related to stress that she has not slept over the past week. - Provided mouthwash today - Encouraged to be compliant with her diabetic medications and psychiatric medications - May need referral to ENT if there is no improvement.

## 2017-04-23 ENCOUNTER — Other Ambulatory Visit: Payer: Self-pay | Admitting: Internal Medicine

## 2017-05-04 ENCOUNTER — Other Ambulatory Visit: Payer: Self-pay | Admitting: Internal Medicine

## 2017-05-04 NOTE — Telephone Encounter (Signed)
Ok 60, 1 RF 

## 2017-05-04 NOTE — Telephone Encounter (Signed)
Rx sent 

## 2017-05-04 NOTE — Telephone Encounter (Signed)
Pt is requesting refill on Flexeril . Last Fill: 01/2017 #60 and 1RF. Chronic usage? Please advise?

## 2017-05-07 ENCOUNTER — Other Ambulatory Visit: Payer: Self-pay | Admitting: Internal Medicine

## 2017-05-16 ENCOUNTER — Other Ambulatory Visit: Payer: Self-pay | Admitting: Internal Medicine

## 2017-05-20 ENCOUNTER — Telehealth: Payer: Self-pay | Admitting: Internal Medicine

## 2017-05-20 NOTE — Telephone Encounter (Signed)
[facility] Day - Client TELEPHONE ADVICE RECORD [facility] Patient Name: Adrienne AlfredRICIA Lo DOB: [birth year] Initial Comment Caller has chest pain when eating. Nurse Assessment Nurse: Solocinski, RN, Beth Date/Time (Eastern Time): 05/20/2017 4:48:39 PM Confirm and document reason for call. If symptomatic, describe symptoms. ---Caller has chest pain when eating and feels like heartburn and took Zantac generic. Caller states it worsens when lying down. Caller states the Zantac made it feel some better. Caller states she has about a 3 - 4/10. Caller states she denies jaw pain, sweating, nausea, back pain nor arm pain. Caller states she used to be on acid reflux medication and they took her off. Does the patient have any new or worsening symptoms? ---Yes Will a triage be completed? ---Yes Related visit to physician within the last 2 weeks? ---No Does the PT have any chronic conditions? (i.e. diabetes, asthma, etc.) ---Yes List chronic conditions. ---anxiety, bipolar, hyperlipdemia, htn, Is this a behavioral health or substance abuse call? ---No Guidelines Guideline Title Affirmed Question Affirmed Notes Chest Pain [1] Chest pain lasting <= 5 minutes AND [2] NO chest pain or cardiac symptoms now (Exceptions: pains lasting a few seconds) Final Disposition User See Physician within 24 Hours Solocinski, RN, Beth Comments Relayed to caller that appointment could not be found at this time, per RN with access, and [facility]. Caller verbalizes understanding and appreciation. Referrals REFERRED TO PCP OFFICE Caller Disagree/Comply Comply Caller Understands Yes PreDisposition Call Doctor Call Id: [phone]

## 2017-05-21 ENCOUNTER — Encounter: Payer: Self-pay | Admitting: Internal Medicine

## 2017-05-21 ENCOUNTER — Ambulatory Visit (INDEPENDENT_AMBULATORY_CARE_PROVIDER_SITE_OTHER): Payer: Medicare Other | Admitting: Internal Medicine

## 2017-05-21 VITALS — BP 142/70 | HR 59 | Temp 97.9°F | Resp 14 | Ht 61.0 in | Wt 142.1 lb

## 2017-05-21 DIAGNOSIS — K2100 Gastro-esophageal reflux disease with esophagitis, without bleeding: Secondary | ICD-10-CM

## 2017-05-21 MED ORDER — PANTOPRAZOLE SODIUM 40 MG PO TBEC: 40.0000 mg | DELAYED_RELEASE_TABLET | Freq: Every day | ORAL | 3 refills | Status: DC

## 2017-05-21 NOTE — Telephone Encounter (Signed)
thx

## 2017-05-21 NOTE — Telephone Encounter (Signed)
FYI

## 2017-05-21 NOTE — Patient Instructions (Signed)
Take pantoprazole every morning before breakfast  Bland diet for a few days  Call if not gradually better  Call if you are not improving in the next 2 weeks   Food Choices for Gastroesophageal Reflux Disease, Adult When you have gastroesophageal reflux disease (GERD), the foods you eat and your eating habits are very important. Choosing the right foods can help ease the discomfort of GERD. Consider working with a diet and nutrition specialist (dietitian) to help you make healthy food choices. What general guidelines should I follow? Eating plan  Choose healthy foods low in fat, such as fruits, vegetables, whole grains, low-fat dairy products, and lean meat, fish, and poultry.  Eat frequent, small meals instead of three large meals each day. Eat your meals slowly, in a relaxed setting. Avoid bending over or lying down until 2-3 hours after eating.  Limit high-fat foods such as fatty meats or fried foods.  Limit your intake of oils, butter, and shortening to less than 8 teaspoons each day.  Avoid the following: ? Foods that cause symptoms. These may be different for different people. Keep a food diary to keep track of foods that cause symptoms. ? Alcohol. ? Drinking large amounts of liquid with meals. ? Eating meals during the 2-3 hours before bed.  Cook foods using methods other than frying. This may include baking, grilling, or broiling. Lifestyle   Maintain a healthy weight. Ask your health care provider what weight is healthy for you. If you need to lose weight, work with your health care provider to do so safely.  Exercise for at least 30 minutes on 5 or more days each week, or as told by your health care provider.  Avoid wearing clothes that fit tightly around your waist and chest.  Do not use any products that contain nicotine or tobacco, such as cigarettes and e-cigarettes. If you need help quitting, ask your health care provider.  Sleep with the head of your bed raised.  Use a wedge under the mattress or blocks under the bed frame to raise the head of the bed. What foods are not recommended? The items listed may not be a complete list. Talk with your dietitian about what dietary choices are best for you. Grains Pastries or quick breads with added fat. [place] toast. Vegetables Deep fried vegetables. [place] fries. Any vegetables prepared with added fat. Any vegetables that cause symptoms. For some people this may include tomatoes and tomato products, chili peppers, onions and garlic, and horseradish. Fruits Any fruits prepared with added fat. Any fruits that cause symptoms. For some people this may include citrus fruits, such as oranges, grapefruit, pineapple, and lemons. Meats and other protein foods High-fat meats, such as fatty beef or pork, hot dogs, ribs, ham, sausage, salami and bacon. Fried meat or protein, including fried fish and fried chicken. Nuts and nut butters. Dairy Whole milk and chocolate milk. Sour cream. Cream. Ice cream. Cream cheese. Milk shakes. Beverages Coffee and tea, with or without caffeine. Carbonated beverages. Sodas. Energy drinks. Fruit juice made with acidic fruits (such as orange or grapefruit). Tomato juice. Alcoholic drinks. Fats and oils Butter. Margarine. Shortening. Ghee. Sweets and desserts Chocolate and cocoa. Donuts. Seasoning and other foods Pepper. Peppermint and spearmint. Any condiments, herbs, or seasonings that cause symptoms. For some people, this may include curry, hot sauce, or vinegar-based salad dressings. Summary  When you have gastroesophageal reflux disease (GERD), food and lifestyle choices are very important to help ease the discomfort of GERD.  Eat frequent, small meals instead of three large meals each day. Eat your meals slowly, in a relaxed setting. Avoid bending over or lying down until 2-3 hours after eating.  Limit high-fat foods such as fatty meat or fried foods. This information is not  intended to replace advice given to you by your health care provider. Make sure you discuss any questions you have with your health care provider. Document Released: 07/13/2005 Document Revised: 07/14/2016 Document Reviewed: 07/14/2016 Elsevier Interactive Patient Education  2017 [employer].

## 2017-05-21 NOTE — Progress Notes (Signed)
Pre visit review using our clinic review tool, if applicable. No additional management support is needed unless otherwise documented below in the visit note. 

## 2017-05-21 NOTE — Telephone Encounter (Signed)
Pt sent a My-Chart message. Called pt. Pt has been scheduled to see PCP today at his next available today at 1:00.
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
## 2017-05-23 DIAGNOSIS — K219 Gastro-esophageal reflux disease without esophagitis: Secondary | ICD-10-CM | POA: Insufficient documentation

## 2017-05-23 HISTORY — DX: Gastro-esophageal reflux disease without esophagitis: K21.9

## 2017-05-23 NOTE — Assessment & Plan Note (Signed)
GERD: Sxs c/w  GERD and possible esophagitis by history.  She is [age], never a smoker.  Will prescribe PPIs daily, for 6 weeks, then as needed.  Avoid meloxicam for at least 2 weeks, use Tylenol instead.  When she goes back to meloxicam to take prn only (has been taking it daily) If she is not improving, she will need a GI  referral  (new onset, age 56 but currently w/ no red flag sxs) Next visit November as schedule

## 2017-06-01 ENCOUNTER — Other Ambulatory Visit: Payer: Self-pay | Admitting: Internal Medicine

## 2017-06-08 ENCOUNTER — Ambulatory Visit (INDEPENDENT_AMBULATORY_CARE_PROVIDER_SITE_OTHER): Payer: Medicare Other | Admitting: Internal Medicine

## 2017-06-08 ENCOUNTER — Encounter: Payer: Self-pay | Admitting: Internal Medicine

## 2017-06-08 VITALS — BP 116/78 | HR 93 | Temp 98.3°F | Resp 14 | Ht 61.0 in | Wt 141.4 lb

## 2017-06-08 DIAGNOSIS — Z23 Encounter for immunization: Secondary | ICD-10-CM | POA: Diagnosis not present

## 2017-06-08 DIAGNOSIS — E1365 Other specified diabetes mellitus with hyperglycemia: Secondary | ICD-10-CM

## 2017-06-08 DIAGNOSIS — IMO0002 Reserved for concepts with insufficient information to code with codable children: Secondary | ICD-10-CM

## 2017-06-08 DIAGNOSIS — Z0000 Encounter for general adult medical examination without abnormal findings: Secondary | ICD-10-CM | POA: Diagnosis not present

## 2017-06-08 LAB — CBC WITH DIFFERENTIAL/PLATELET
BASOS ABS: 0 10*3/uL (ref 0.0–0.1)
BASOS PCT: 0.6 % (ref 0.0–3.0)
Eosinophils Absolute: 0.1 10*3/uL (ref 0.0–0.7)
Eosinophils Relative: 1.8 % (ref 0.0–5.0)
HEMATOCRIT: 39.4 % (ref 36.0–46.0)
HEMOGLOBIN: 13 g/dL (ref 12.0–15.0)
Lymphocytes Relative: 42.1 % (ref 12.0–46.0)
Lymphs Abs: 2.3 10*3/uL (ref 0.7–4.0)
MCHC: 33 g/dL (ref 30.0–36.0)
MCV: 89.5 fl (ref 78.0–100.0)
MONO ABS: 0.3 10*3/uL (ref 0.1–1.0)
Monocytes Relative: 6 % (ref 3.0–12.0)
NEUTROS ABS: 2.7 10*3/uL (ref 1.4–7.7)
Neutrophils Relative %: 49.5 % (ref 43.0–77.0)
Platelets: 238 10*3/uL (ref 150.0–400.0)
RBC: 4.41 Mil/uL (ref 3.87–5.11)
RDW: 13.4 % (ref 11.5–15.5)
WBC: 5.4 10*3/uL (ref 4.0–10.5)

## 2017-06-08 LAB — COMPREHENSIVE METABOLIC PANEL
ALBUMIN: 3.9 g/dL (ref 3.5–5.2)
ALT: 23 U/L (ref 0–35)
AST: 23 U/L (ref 0–37)
Alkaline Phosphatase: 53 U/L (ref 39–117)
BUN: 16 mg/dL (ref 6–23)
CHLORIDE: 99 meq/L (ref 96–112)
CO2: 37 meq/L — AB (ref 19–32)
Calcium: 9.4 mg/dL (ref 8.4–10.5)
Creatinine, Ser: 0.48 mg/dL (ref 0.40–1.20)
GFR: 141.57 mL/min (ref >60.00)
GLUCOSE: 130 mg/dL — AB (ref 70–99)
POTASSIUM: 4.5 meq/L (ref 3.5–5.1)
SODIUM: 141 meq/L (ref 135–145)
Total Bilirubin: 0.3 mg/dL (ref 0.2–1.2)
Total Protein: 7.5 g/dL (ref 6.0–8.3)

## 2017-06-08 LAB — LIPID PANEL
CHOL/HDL RATIO: 3
CHOLESTEROL: 140 mg/dL (ref 0–200)
HDL: 40.9 mg/dL (ref >39.00)
LDL Cholesterol: 81 mg/dL (ref 0–99)
NonHDL: 99.18
Triglycerides: 90 mg/dL (ref 0.0–149.0)
VLDL: 18 mg/dL (ref 0.0–40.0)

## 2017-06-08 LAB — TSH: TSH: 0.32 u[IU]/mL — AB (ref 0.35–4.50)

## 2017-06-08 LAB — HEMOGLOBIN A1C: HEMOGLOBIN A1C: 9.1 % — AB (ref 4.6–6.5)

## 2017-06-08 NOTE — Assessment & Plan Note (Addendum)
-  Tdap: 2013;   prevnar: 2016;  Pneumonia shot 2010;  shingrex discussed ; flu shot today -CCS:  Colonoscopy 07-2010, Dr. Randa EvensEdwards, had polyps;  due for f/u cscope but again she states she would never do that again. See comments from 2017, iFOB (-) 2017;  will provide another iFOB  -Female care: Sees Dr Luna KitchensG Atkins ; [facility] (-) [phone]; Had a bone density tests with Dr. Arelia SneddonMcComb, gyn, reportedly normal. Menopausal since 2016, reassess the need for DEXA in the future -Diet and exercise: Counseled + FH CAD:  intolerant to aspirin daily  due to gum bleeding, declined even low doses  -Labs: CMP, FLP, CBC, A1c, TSH

## 2017-06-08 NOTE — Patient Instructions (Addendum)
GO TO THE LAB : Get the blood work     GO TO THE FRONT DESK Schedule your next appointment for a checkup in 4 months  Continue with pantoprazole daily for the next 3 weeks, then stop.  If your acid reflux symptoms come back please let me know  Call your gynecologist within the next few weeks if you don't  have an appointment

## 2017-06-08 NOTE — Progress Notes (Signed)
Pre visit review using our clinic review tool, if applicable. No additional management support is needed unless otherwise documented below in the visit note. 
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
## 2017-06-09 NOTE — Assessment & Plan Note (Signed)
DM: Last A1c 11.9. Declined ref to Endocrinology, states she has improved   her medication compliance.  Check A1c today.  Continue with Januvia, metformin HTN:  well controlled on amlodipine, Inderal. High cholesterol: On Lipitor, check and labs Thyromegaly: Exam shows a mildly enlarged gland.  Consider ultrasound next year. GERD: Sxs completely resolved with PPIs.  Rec  to continue PPIs for 3 more weeks then stop.  If sxs resurface she is to let me know. ? of systolic murmur: Reassess yearly  RTC 4 months ROV

## 2017-06-14 ENCOUNTER — Encounter: Payer: Self-pay | Admitting: Internal Medicine

## 2017-06-15 ENCOUNTER — Telehealth: Payer: Self-pay

## 2017-06-15 MED ORDER — PIOGLITAZONE HCL 30 MG PO TABS: 30.0000 mg | ORAL_TABLET | Freq: Every day | ORAL | 3 refills | Status: DC

## 2017-06-15 NOTE — Addendum Note (Signed)
Addended by: Conrad BurlingtonANTER, Klever Twyford D on: 06/15/2017 10:48 AM   Modules accepted: Orders

## 2017-06-15 NOTE — Telephone Encounter (Signed)
Copied from [employer] [id number]. Topic: General - Other >> Jun 15, 2017 11:28 AM Damita Dunnings, CMA wrote: Reason for CRM: PCP forgot to give Pt and IFOB kit at CPE on 06/08/2017. IFOB mailed.

## 2017-07-09 ENCOUNTER — Other Ambulatory Visit (INDEPENDENT_AMBULATORY_CARE_PROVIDER_SITE_OTHER): Payer: Medicare Other

## 2017-07-09 DIAGNOSIS — Z0000 Encounter for general adult medical examination without abnormal findings: Secondary | ICD-10-CM

## 2017-07-09 LAB — FECAL OCCULT BLOOD, IMMUNOCHEMICAL: FECAL OCCULT BLD: NEGATIVE

## 2017-07-11 ENCOUNTER — Other Ambulatory Visit: Payer: Self-pay | Admitting: Internal Medicine

## 2017-08-03 ENCOUNTER — Other Ambulatory Visit: Payer: Self-pay | Admitting: Internal Medicine

## 2017-08-04 ENCOUNTER — Other Ambulatory Visit: Payer: Self-pay | Admitting: Internal Medicine

## 2017-08-18 ENCOUNTER — Encounter: Payer: Self-pay | Admitting: Internal Medicine

## 2017-08-19 ENCOUNTER — Encounter: Payer: Self-pay | Admitting: Internal Medicine

## 2017-08-19 ENCOUNTER — Ambulatory Visit (INDEPENDENT_AMBULATORY_CARE_PROVIDER_SITE_OTHER): Payer: Medicare Other | Admitting: Internal Medicine

## 2017-08-19 VITALS — BP 144/84 | HR 91 | Temp 98.1°F | Resp 14 | Ht 61.0 in | Wt 140.0 lb

## 2017-08-19 DIAGNOSIS — M79602 Pain in left arm: Secondary | ICD-10-CM | POA: Diagnosis not present

## 2017-08-19 NOTE — Patient Instructions (Signed)
TAKE MELOXICAM once a day  needed for pain.  Always take it with food because may cause gastritis and ulcers.  If you notice nausea, stomach pain, change in the color of stools --->  Stop the medicine and let us know  ALSO OK TO TAKE Tylenol  500 mg OTC 2 tabs a day every 8 hours as needed for pain  Call in few days if no better  Call if severe pain

## 2017-08-19 NOTE — Telephone Encounter (Signed)
Patient is scheduled for tomorrow, I see prior note from Dr Drue NovelPaz stating to be seen today or tomorrow. Patient states today, please advise

## 2017-08-19 NOTE — Telephone Encounter (Signed)
Spoke w/ Pt- working her in at 11:40 today.

## 2017-08-19 NOTE — Progress Notes (Signed)
Pre visit review using our clinic review tool, if applicable. No additional management support is needed unless otherwise documented below in the visit note. 
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
## 2017-08-20 ENCOUNTER — Ambulatory Visit: Payer: Medicare Other | Admitting: Internal Medicine
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
## 2017-08-25 ENCOUNTER — Other Ambulatory Visit: Payer: Self-pay | Admitting: Internal Medicine

## 2017-08-25 MED ORDER — ATORVASTATIN CALCIUM 20 MG PO TABS: 20.0000 mg | ORAL_TABLET | Freq: Every day | ORAL | 1 refills | Status: DC

## 2017-08-25 NOTE — Telephone Encounter (Signed)
Copied from CRM [id number]. Topic: Quick Communication - See Telephone Encounter >> Aug 25, 2017  1:27 PM Landry MellowFoltz, Melissa J wrote: CRM for notification. See Telephone encounter for:   08/25/17.[facility] called - she needs verba auth to dispense med atorvastatin (LIPITOR) 20 MG tablet. Cb# is [id number] reference # [id number]

## 2017-08-25 NOTE — Telephone Encounter (Signed)
Pt requesting authorization for atorvastatin for [facility].  Their call back number is [phone]  Reference # is [id number].

## 2017-08-25 NOTE — Telephone Encounter (Signed)
Rx sent electronically to [facility]. 

## 2017-08-30 ENCOUNTER — Other Ambulatory Visit: Payer: Self-pay

## 2017-08-30 MED ORDER — CYCLOBENZAPRINE HCL 10 MG PO TABS: 10.0000 mg | ORAL_TABLET | Freq: Two times a day (BID) | ORAL | 0 refills | Status: DC | PRN

## 2017-08-30 MED ORDER — MELOXICAM 7.5 MG PO TABS: ORAL_TABLET | ORAL | 0 refills | Status: DC

## 2017-09-04 ENCOUNTER — Other Ambulatory Visit: Payer: Self-pay | Admitting: Internal Medicine

## 2017-09-07 ENCOUNTER — Other Ambulatory Visit: Payer: Self-pay

## 2017-09-07 MED ORDER — PROPRANOLOL HCL 10 MG PO TABS: 20.0000 mg | ORAL_TABLET | Freq: Two times a day (BID) | ORAL | 1 refills | Status: DC

## 2017-09-23 ENCOUNTER — Encounter (HOSPITAL_COMMUNITY): Payer: Self-pay | Admitting: *Deleted

## 2017-09-23 ENCOUNTER — Emergency Department (HOSPITAL_COMMUNITY): Payer: Medicare Other

## 2017-09-23 ENCOUNTER — Other Ambulatory Visit: Payer: Self-pay

## 2017-09-23 ENCOUNTER — Inpatient Hospital Stay (HOSPITAL_COMMUNITY)
Admission: EM | Admit: 2017-09-23 | Discharge: 2017-09-25 | DRG: 101 | Disposition: A | Discharge status: Home / Self Care | Payer: Medicare Other | Attending: Internal Medicine | Admitting: Internal Medicine

## 2017-09-23 DIAGNOSIS — Z7984 Long term (current) use of oral hypoglycemic drugs: Secondary | ICD-10-CM

## 2017-09-23 DIAGNOSIS — I159 Secondary hypertension, unspecified: Secondary | ICD-10-CM | POA: Diagnosis not present

## 2017-09-23 DIAGNOSIS — T426X6A Underdosing of other antiepileptic and sedative-hypnotic drugs, initial encounter: Secondary | ICD-10-CM | POA: Diagnosis not present

## 2017-09-23 DIAGNOSIS — Y92410 Unspecified street and highway as the place of occurrence of the external cause: Secondary | ICD-10-CM | POA: Diagnosis not present

## 2017-09-23 DIAGNOSIS — Z23 Encounter for immunization: Secondary | ICD-10-CM

## 2017-09-23 DIAGNOSIS — I674 Hypertensive encephalopathy: Secondary | ICD-10-CM | POA: Diagnosis present

## 2017-09-23 DIAGNOSIS — M1990 Unspecified osteoarthritis, unspecified site: Secondary | ICD-10-CM | POA: Diagnosis present

## 2017-09-23 DIAGNOSIS — R569 Unspecified convulsions: Secondary | ICD-10-CM | POA: Diagnosis not present

## 2017-09-23 DIAGNOSIS — T424X6A Underdosing of benzodiazepines, initial encounter: Secondary | ICD-10-CM | POA: Diagnosis not present

## 2017-09-23 DIAGNOSIS — J9600 Acute respiratory failure, unspecified whether with hypoxia or hypercapnia: Secondary | ICD-10-CM | POA: Diagnosis not present

## 2017-09-23 DIAGNOSIS — Z880 Allergy status to penicillin: Secondary | ICD-10-CM | POA: Diagnosis not present

## 2017-09-23 DIAGNOSIS — F13939 Sedative, hypnotic or anxiolytic use, unspecified with withdrawal, unspecified: Secondary | ICD-10-CM

## 2017-09-23 DIAGNOSIS — G9340 Encephalopathy, unspecified: Secondary | ICD-10-CM

## 2017-09-23 DIAGNOSIS — Z915 Personal history of self-harm: Secondary | ICD-10-CM | POA: Diagnosis not present

## 2017-09-23 DIAGNOSIS — R402423 Glasgow coma scale score 9-12, at hospital admission: Secondary | ICD-10-CM | POA: Diagnosis not present

## 2017-09-23 DIAGNOSIS — G40901 Epilepsy, unspecified, not intractable, with status epilepticus: Secondary | ICD-10-CM | POA: Diagnosis present

## 2017-09-23 DIAGNOSIS — Z79899 Other long term (current) drug therapy: Secondary | ICD-10-CM

## 2017-09-23 DIAGNOSIS — E119 Type 2 diabetes mellitus without complications: Secondary | ICD-10-CM | POA: Diagnosis present

## 2017-09-23 DIAGNOSIS — Z7982 Long term (current) use of aspirin: Secondary | ICD-10-CM

## 2017-09-23 DIAGNOSIS — Z91011 Allergy to milk products: Secondary | ICD-10-CM | POA: Diagnosis not present

## 2017-09-23 DIAGNOSIS — R918 Other nonspecific abnormal finding of lung field: Secondary | ICD-10-CM | POA: Diagnosis not present

## 2017-09-23 DIAGNOSIS — Z8659 Personal history of other mental and behavioral disorders: Secondary | ICD-10-CM

## 2017-09-23 DIAGNOSIS — E7800 Pure hypercholesterolemia, unspecified: Secondary | ICD-10-CM | POA: Diagnosis not present

## 2017-09-23 DIAGNOSIS — I10 Essential (primary) hypertension: Secondary | ICD-10-CM | POA: Diagnosis not present

## 2017-09-23 DIAGNOSIS — F319 Bipolar disorder, unspecified: Secondary | ICD-10-CM | POA: Diagnosis present

## 2017-09-23 DIAGNOSIS — F309 Manic episode, unspecified: Secondary | ICD-10-CM | POA: Diagnosis not present

## 2017-09-23 DIAGNOSIS — F13239 Sedative, hypnotic or anxiolytic dependence with withdrawal, unspecified: Secondary | ICD-10-CM | POA: Diagnosis not present

## 2017-09-23 DIAGNOSIS — Z8669 Personal history of other diseases of the nervous system and sense organs: Secondary | ICD-10-CM | POA: Diagnosis not present

## 2017-09-23 DIAGNOSIS — Z888 Allergy status to other drugs, medicaments and biological substances status: Secondary | ICD-10-CM | POA: Diagnosis not present

## 2017-09-23 LAB — URINALYSIS, COMPLETE (UACMP) WITH MICROSCOPIC
Bilirubin Urine: NEGATIVE
GLUCOSE, UA: 150 mg/dL — AB
KETONES UR: NEGATIVE mg/dL
Leukocytes, UA: NEGATIVE
NITRITE: NEGATIVE
PH: 5 (ref 5.0–8.0)
Protein, ur: >=300 mg/dL — AB
SPECIFIC GRAVITY, URINE: 1.014 (ref 1.005–1.030)

## 2017-09-23 LAB — CBC WITH DIFFERENTIAL/PLATELET
BASOS ABS: 0 10*3/uL (ref 0.0–0.1)
Basophils Relative: 0 %
EOS ABS: 0.3 10*3/uL (ref 0.0–0.7)
Eosinophils Relative: 2 %
HCT: 40.8 % (ref 36.0–46.0)
HEMOGLOBIN: 13.2 g/dL (ref 12.0–15.0)
LYMPHS PCT: 45 %
Lymphs Abs: 6.4 10*3/uL — ABNORMAL HIGH (ref 0.7–4.0)
MCH: 29.7 pg (ref 26.0–34.0)
MCHC: 32.4 g/dL (ref 30.0–36.0)
MCV: 91.7 fL (ref 78.0–100.0)
MONO ABS: 1.1 10*3/uL — AB (ref 0.1–1.0)
Monocytes Relative: 8 %
NEUTROS PCT: 45 %
Neutro Abs: 6.5 10*3/uL (ref 1.7–7.7)
PLATELETS: 317 10*3/uL (ref 150–400)
RBC: 4.45 MIL/uL (ref 3.87–5.11)
RDW: 13.5 % (ref 11.5–15.5)
WBC: 14.3 10*3/uL — ABNORMAL HIGH (ref 4.0–10.5)

## 2017-09-23 LAB — URINALYSIS, ROUTINE W REFLEX MICROSCOPIC
Bilirubin Urine: NEGATIVE
Glucose, UA: 150 mg/dL — AB
Hgb urine dipstick: NEGATIVE
Ketones, ur: NEGATIVE mg/dL
LEUKOCYTES UA: NEGATIVE
Nitrite: NEGATIVE
PH: 9 — AB (ref 5.0–8.0)
Protein, ur: 100 mg/dL — AB
SPECIFIC GRAVITY, URINE: 1.011 (ref 1.005–1.030)

## 2017-09-23 LAB — COMPREHENSIVE METABOLIC PANEL
ALK PHOS: 77 U/L (ref 38–126)
ALT: 28 U/L (ref 14–54)
ANION GAP: 21 — AB (ref 5–15)
AST: 38 U/L (ref 15–41)
Albumin: 3.7 g/dL (ref 3.5–5.0)
BILIRUBIN TOTAL: 0.6 mg/dL (ref 0.3–1.2)
BUN: 23 mg/dL — ABNORMAL HIGH (ref 6–20)
CALCIUM: 9.4 mg/dL (ref 8.9–10.3)
CO2: 19 mmol/L — ABNORMAL LOW (ref 22–32)
Chloride: 97 mmol/L — ABNORMAL LOW (ref 101–111)
Creatinine, Ser: 0.91 mg/dL (ref 0.44–1.00)
GFR calc non Af Amer: >60 mL/min (ref >60)
Glucose, Bld: 288 mg/dL — ABNORMAL HIGH (ref 65–99)
Potassium: 3.3 mmol/L — ABNORMAL LOW (ref 3.5–5.1)
Sodium: 137 mmol/L (ref 135–145)
TOTAL PROTEIN: 8.1 g/dL (ref 6.5–8.1)

## 2017-09-23 LAB — I-STAT VENOUS BLOOD GAS, ED
ACID-BASE DEFICIT: 6 mmol/L — AB (ref 0.0–2.0)
Bicarbonate: 24.5 mmol/L (ref 20.0–28.0)
O2 Saturation: 99 %
PCO2 VEN: 72.5 mmHg — AB (ref 44.0–60.0)
PH VEN: 7.136 — AB (ref 7.250–7.430)
PO2 VEN: 167 mmHg — AB (ref 32.0–45.0)
TCO2: 27 mmol/L (ref 22–32)

## 2017-09-23 LAB — I-STAT BETA HCG BLOOD, ED (MC, WL, AP ONLY): I-stat hCG, quantitative: <5 m[IU]/mL (ref <5)

## 2017-09-23 LAB — I-STAT CHEM 8, ED
BUN: 28 mg/dL — AB (ref 6–20)
CHLORIDE: 100 mmol/L — AB (ref 101–111)
CREATININE: 0.6 mg/dL (ref 0.44–1.00)
Calcium, Ion: 1.11 mmol/L — ABNORMAL LOW (ref 1.15–1.40)
Glucose, Bld: 292 mg/dL — ABNORMAL HIGH (ref 65–99)
HEMATOCRIT: 43 % (ref 36.0–46.0)
HEMOGLOBIN: 14.6 g/dL (ref 12.0–15.0)
POTASSIUM: 3.3 mmol/L — AB (ref 3.5–5.1)
Sodium: 141 mmol/L (ref 135–145)
TCO2: 25 mmol/L (ref 22–32)

## 2017-09-23 LAB — CREATININE, SERUM: Creatinine, Ser: 0.66 mg/dL (ref 0.44–1.00)

## 2017-09-23 LAB — GLUCOSE, CAPILLARY
GLUCOSE-CAPILLARY: 105 mg/dL — AB (ref 65–99)
Glucose-Capillary: 119 mg/dL — ABNORMAL HIGH (ref 65–99)
Glucose-Capillary: 204 mg/dL — ABNORMAL HIGH (ref 65–99)

## 2017-09-23 LAB — RAPID URINE DRUG SCREEN, HOSP PERFORMED
Amphetamines: NOT DETECTED
Barbiturates: NOT DETECTED
Benzodiazepines: POSITIVE — AB
COCAINE: NOT DETECTED
OPIATES: NOT DETECTED
Tetrahydrocannabinol: NOT DETECTED

## 2017-09-23 LAB — VALPROIC ACID LEVEL

## 2017-09-23 LAB — I-STAT CG4 LACTIC ACID, ED
Lactic Acid, Venous: 12.31 mmol/L (ref 0.5–1.9)
Lactic Acid, Venous: 2.17 mmol/L (ref 0.5–1.9)

## 2017-09-23 LAB — ETHANOL

## 2017-09-23 LAB — PHOSPHORUS: Phosphorus: 4.8 mg/dL — ABNORMAL HIGH (ref 2.5–4.6)

## 2017-09-23 LAB — PROTIME-INR
INR: 1.06
PROTHROMBIN TIME: 13.7 s (ref 11.4–15.2)

## 2017-09-23 LAB — MRSA PCR SCREENING: MRSA by PCR: NEGATIVE

## 2017-09-23 LAB — MAGNESIUM: Magnesium: 1.7 mg/dL (ref 1.7–2.4)

## 2017-09-23 MED ORDER — HEPARIN SODIUM (PORCINE) 5000 UNIT/ML IJ SOLN
5000.0000 [IU] | Freq: Three times a day (TID) | INTRAMUSCULAR | Status: DC
  Administered 2017-09-23 – 2017-09-25 (×5): 5000 [IU] via SUBCUTANEOUS
  Filled 2017-09-23 (×6): qty 1

## 2017-09-23 MED ORDER — PNEUMOCOCCAL VAC POLYVALENT 25 MCG/0.5ML IJ INJ
0.5000 mL | INJECTION | INTRAMUSCULAR | Status: AC
  Administered 2017-09-24: 0.5 mL via INTRAMUSCULAR
  Filled 2017-09-23: qty 0.5

## 2017-09-23 MED ORDER — ORAL CARE MOUTH RINSE: 15.0000 mL | Freq: Two times a day (BID) | OROMUCOSAL | Status: DC

## 2017-09-23 MED ORDER — ALPRAZOLAM 0.5 MG PO TABS
1.0000 mg | ORAL_TABLET | Freq: Two times a day (BID) | ORAL | Status: DC | PRN
  Administered 2017-09-23: 1 mg via ORAL
  Filled 2017-09-23: qty 2

## 2017-09-23 MED ORDER — SODIUM CHLORIDE 0.9 % IV SOLN
INTRAVENOUS | Status: DC
  Administered 2017-09-23: 16:00:00 via INTRAVENOUS

## 2017-09-23 MED ORDER — LABETALOL HCL 5 MG/ML IV SOLN
20.0000 mg | INTRAVENOUS | Status: DC | PRN
  Filled 2017-09-23: qty 4

## 2017-09-23 MED ORDER — LORAZEPAM 2 MG/ML IJ SOLN
INTRAMUSCULAR | Status: AC
  Filled 2017-09-23: qty 1

## 2017-09-23 MED ORDER — LACTATED RINGERS IV BOLUS (SEPSIS)
1000.0000 mL | Freq: Once | INTRAVENOUS | Status: AC
  Administered 2017-09-23: 1000 mL via INTRAVENOUS

## 2017-09-23 MED ORDER — INSULIN ASPART 100 UNIT/ML ~~LOC~~ SOLN
0.0000 [IU] | SUBCUTANEOUS | Status: DC
  Administered 2017-09-23: 5 [IU] via SUBCUTANEOUS
  Administered 2017-09-24: 3 [IU] via SUBCUTANEOUS
  Administered 2017-09-24 (×3): 2 [IU] via SUBCUTANEOUS
  Administered 2017-09-25: 3 [IU] via SUBCUTANEOUS
  Administered 2017-09-25: 2 [IU] via SUBCUTANEOUS

## 2017-09-23 MED ORDER — VALPROATE SODIUM 500 MG/5ML IV SOLN
500.0000 mg | Freq: Once | INTRAVENOUS | Status: AC
  Filled 2017-09-23 (×2): qty 5

## 2017-09-23 MED ORDER — PROPRANOLOL HCL 20 MG/5ML PO SOLN
40.0000 mg | Freq: Two times a day (BID) | ORAL | Status: DC
  Administered 2017-09-23 – 2017-09-25 (×3): 40 mg via ORAL
  Filled 2017-09-23 (×5): qty 10

## 2017-09-23 MED ORDER — SODIUM CHLORIDE 0.9 % IV SOLN
1500.0000 mg | Freq: Once | INTRAVENOUS | Status: AC
  Administered 2017-09-23: 1500 mg via INTRAVENOUS
  Filled 2017-09-23: qty 15

## 2017-09-23 MED ORDER — VALPROATE SODIUM 500 MG/5ML IV SOLN
500.0000 mg | Freq: Two times a day (BID) | INTRAVENOUS | Status: DC
  Administered 2017-09-23 – 2017-09-25 (×5): 500 mg via INTRAVENOUS
  Filled 2017-09-23 (×6): qty 5

## 2017-09-23 MED ORDER — PROPOFOL 10 MG/ML IV BOLUS
70.0000 mg | Freq: Once | INTRAVENOUS | Status: DC
  Filled 2017-09-23: qty 20

## 2017-09-23 MED ORDER — SODIUM CHLORIDE 0.9 % IV BOLUS (SEPSIS)
1000.0000 mL | Freq: Once | INTRAVENOUS | Status: AC
Start: 2017-09-23 — End: 2017-09-23
  Administered 2017-09-23: 1000 mL via INTRAVENOUS

## 2017-09-23 MED ORDER — LORAZEPAM 2 MG/ML IJ SOLN: 1.0000 mg | INTRAMUSCULAR | Status: DC | PRN

## 2017-09-23 MED ORDER — ONDANSETRON HCL 4 MG/2ML IJ SOLN
INTRAMUSCULAR | Status: AC
  Filled 2017-09-23: qty 2

## 2017-09-23 MED ORDER — LABETALOL HCL 5 MG/ML IV SOLN
20.0000 mg | Freq: Once | INTRAVENOUS | Status: AC
  Administered 2017-09-23: 20 mg via INTRAVENOUS
  Filled 2017-09-23: qty 4
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
## 2017-09-23 NOTE — ED Notes (Signed)
Patient rolled her eyes back became stiff and has a seizure lasted apporx. 2-3 minutes, remained post-icle for about 30 mins. Son came to bedside. Patient was speaking coherently. Staring to remember leaving [facility] after visiting husband and she was going to get food. States her mind was racing and she couldn't get her thoughts together. [facility] he called and stated patient is on several " mental meds" and he gives her her medication, he doesn't think she has taken them since he was hospitalized on Monday.

## 2017-09-23 NOTE — Procedures (Signed)
EEG Report  Clinical History:  Witnessed to have 2 seizures today.  Her Tramadol dose was increased recently.  CT negative for acute hemorrhage.   Ativan was given after the last seizure.    Technical Summary:  A 19 channel digital EEG recording was performed using the 10-20 international system of electrode placement.  Bipolar and Referential montages were used.  The total recording time was approx 20 minutes.  Findings:  There is a posterior dominant rhythm of 8-9 Hz reactive to eye opening and closure.  No focal slowing is present. Photic stimulation and hyperventilation are not performed.  She gets drowsy, but sleep is not recorded.  There are no epileptiform discharges or electrographic seizures present.    Impression:  This is a normal EEG.  However, this does not rule out seizures. If clinical suspicion remains high then a sleep deprived EEG and/or prolonged ambulatory EEG may be of additional value.    Weston SettleShervin Rally Ouch, MS, MD

## 2017-09-23 NOTE — Progress Notes (Signed)
EEG completed, results pending. 

## 2017-09-23 NOTE — ED Notes (Signed)
EEG at bedside.
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
## 2017-09-23 NOTE — ED Notes (Signed)
Went to CT Scan Dr. Shyrl NumbersNanavanti with patient while in CT patent stopped taking and had a seizure lasting 30 sec. Patient became stiff her eyes rolled back . Ativan 1 mg given per order Dr. Shyrl NumbersNanavanti. Ct scan was completed.
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
## 2017-09-23 NOTE — ED Notes (Signed)
Returned to ED patient is still post-icle. Neuro at bedside. Sats decreased to 88% on 4 liters [place] , resp converted to 45% vent mask. Sat increased to 95%. Son remains at bedside.
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
## 2017-09-24 ENCOUNTER — Inpatient Hospital Stay (HOSPITAL_COMMUNITY): Payer: Medicare Other

## 2017-09-24 ENCOUNTER — Other Ambulatory Visit: Payer: Self-pay

## 2017-09-24 DIAGNOSIS — G40901 Epilepsy, unspecified, not intractable, with status epilepticus: Principal | ICD-10-CM

## 2017-09-24 LAB — BASIC METABOLIC PANEL
ANION GAP: 9 (ref 5–15)
BUN: 15 mg/dL (ref 6–20)
CO2: 29 mmol/L (ref 22–32)
Calcium: 8.5 mg/dL — ABNORMAL LOW (ref 8.9–10.3)
Chloride: 102 mmol/L (ref 101–111)
Creatinine, Ser: 0.64 mg/dL (ref 0.44–1.00)
GFR calc Af Amer: >60 mL/min (ref >60)
GFR calc non Af Amer: >60 mL/min (ref >60)
Glucose, Bld: 126 mg/dL — ABNORMAL HIGH (ref 65–99)
POTASSIUM: 3.9 mmol/L (ref 3.5–5.1)
Sodium: 140 mmol/L (ref 135–145)

## 2017-09-24 LAB — LACTIC ACID, PLASMA: LACTIC ACID, VENOUS: 0.9 mmol/L (ref 0.5–1.9)

## 2017-09-24 LAB — GLUCOSE, CAPILLARY
GLUCOSE-CAPILLARY: 127 mg/dL — AB (ref 65–99)
GLUCOSE-CAPILLARY: 94 mg/dL (ref 65–99)
GLUCOSE-CAPILLARY: 138 mg/dL — AB (ref 65–99)
Glucose-Capillary: 122 mg/dL — ABNORMAL HIGH (ref 65–99)
Glucose-Capillary: 154 mg/dL — ABNORMAL HIGH (ref 65–99)

## 2017-09-24 LAB — PHOSPHORUS: Phosphorus: 4 mg/dL (ref 2.5–4.6)

## 2017-09-24 LAB — CBC
HEMATOCRIT: 34.6 % — AB (ref 36.0–46.0)
HEMOGLOBIN: 11.1 g/dL — AB (ref 12.0–15.0)
MCH: 29.2 pg (ref 26.0–34.0)
MCHC: 32.1 g/dL (ref 30.0–36.0)
MCV: 91.1 fL (ref 78.0–100.0)
Platelets: 243 10*3/uL (ref 150–400)
RBC: 3.8 MIL/uL — ABNORMAL LOW (ref 3.87–5.11)
RDW: 13.6 % (ref 11.5–15.5)
WBC: 9.5 10*3/uL (ref 4.0–10.5)

## 2017-09-24 LAB — MAGNESIUM: Magnesium: 1.6 mg/dL — ABNORMAL LOW (ref 1.7–2.4)

## 2017-09-24 MED ORDER — BUPROPION HCL ER (XL) 150 MG PO TB24
300.0000 mg | ORAL_TABLET | Freq: Every day | ORAL | Status: DC
  Administered 2017-09-24 – 2017-09-25 (×2): 300 mg via ORAL
  Filled 2017-09-24: qty 2
  Filled 2017-09-24: qty 1

## 2017-09-24 MED ORDER — ALPRAZOLAM 0.5 MG PO TABS: 1.0000 mg | ORAL_TABLET | Freq: Two times a day (BID) | ORAL | Status: DC | PRN

## 2017-09-24 MED ORDER — ACETAMINOPHEN 325 MG PO TABS
650.0000 mg | ORAL_TABLET | Freq: Four times a day (QID) | ORAL | Status: DC | PRN
  Administered 2017-09-24: 650 mg via ORAL
  Filled 2017-09-24: qty 2
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
## 2017-09-24 NOTE — Plan of Care (Signed)
Pt up to chair w/ 2 staff assist. Pt able to communicate needs. No issues w/ urinary retention, able to void on bedside commode. Pt ate 90-100% of meals. SpO2 95-98% on RA. Discussed need to take prescribed medications in hospital and after discharge. Husband is pt's primary support.

## 2017-09-24 NOTE — Progress Notes (Addendum)
Reason for consult:   Subjective: patient is back to her baseline.    ROS: negative except above   Examination  Vital signs in last 24 hours: Temp:  [97.9 F (36.6 C)-99.5 F (37.5 C)] 98.2 F (36.8 C) (03/01 0900) Pulse Rate:  [47-107] 49 (03/01 1300) Resp:  [16-25] 19 (03/01 1300) BP: (113-178)/(54-127) 144/54 (03/01 1300) SpO2:  [99 %-100 %] 100 % (03/01 1300) Weight:  [63 kg (138 lb 14.2 oz)-64.9 kg (143 lb 1.3 oz)] 64.9 kg (143 lb 1.3 oz) (03/01 0444)  General: Not in distress, cooperative CVS: pulse-normal rate and rhythm RS: breathing comfortably Extremities: normal   Neuro: MS: Alert, oriented, follows commands CN: pupils equal and reactive,  EOMI, face symmetric, tongue midline, normal sensation over face, Motor: 5/5 strength in all 4 extremities Reflexes: 2+ bilaterally over patella, biceps, plantars: flexor Coordination: normal Gait: not tested  Basic Metabolic Panel: Recent Labs  Lab 09/23/17 1014 09/23/17 1034 09/23/17 1603 09/24/17 0415  NA 137 141  --  140  K 3.3* 3.3*  --  3.9  CL 97* 100*  --  102  CO2 19*  --   --  29  GLUCOSE 288* 292*  --  126*  BUN 23* 28*  --  15  CREATININE 0.91 0.60 0.66 0.64  CALCIUM 9.4  --   --  8.5*  MG 1.7  --   --  1.6*  PHOS 4.8*  --   --  4.0    CBC: Recent Labs  Lab 09/23/17 1014 09/23/17 1034 09/24/17 0415  WBC 14.3*  --  9.5  NEUTROABS 6.5  --   --   HGB 13.2 14.6 11.1*  HCT 40.8 43.0 34.6*  MCV 91.7  --  91.1  PLT 317  --  243     Coagulation Studies: Recent Labs    09/23/17 1014  LABPROT 13.7  INR 1.06    Imaging Reviewed:     ASSESSMENT AND PLAN  Status epilepticus -resolved HTN encephalopathy  Etiology: Most likely from  Xanax withdrawal, husband who normally gives her medications has not given her meds for last 4 days as he was admitted in hospital Will discontinue Keppra Resume home Depakote, Xanax Routine EEG: normal  CT head normal MRI ordered to r/o PRES, however pt  back to baseline . Patient prefers not to have one and I think this is reasonable and has she has  no vision loss, neurological deficits.    Per [facility] statutes, patients with seizures are not allowed to drive until they have been seizure-free for six months. Use caution when using heavy equipment or power tools. Avoid working on ladders or at heights. Take showers instead of baths. Ensure the water temperature is not too high on the home water heater. Do not go swimming alone. Do not lock yourself in a room alone (i.e. bathroom). When caring for infants or small children, sit down when holding, feeding, or changing them to minimize risk of injury to the child in the event you have a seizure. Maintain good sleep hygiene. Avoid alcohol.    If Adrienne Bennett has another seizure, call 911 and bring them back to the ED if:       A.  The seizure lasts longer than 5 minutes.            B.  The patient doesn't wake shortly after the seizure or has new problems such as difficulty seeing, speaking or moving following the seizure  C.  The patient was injured during the seizure       D.  The patient has a temperature over 102 F (39C)       E.  The patient vomited during the seizure and now is having trouble breathing     Adrienne SpinnerSushanth Aroor [facility] Pager Number [phone] For questions after 7pm please refer to AMION to reach the Neurologist on call

## 2017-09-25 DIAGNOSIS — G40901 Epilepsy, unspecified, not intractable, with status epilepticus: Secondary | ICD-10-CM | POA: Diagnosis present

## 2017-09-25 DIAGNOSIS — F13239 Sedative, hypnotic or anxiolytic dependence with withdrawal, unspecified: Secondary | ICD-10-CM

## 2017-09-25 DIAGNOSIS — Z23 Encounter for immunization: Secondary | ICD-10-CM | POA: Diagnosis not present

## 2017-09-25 DIAGNOSIS — F309 Manic episode, unspecified: Secondary | ICD-10-CM

## 2017-09-25 LAB — CBC WITH DIFFERENTIAL/PLATELET
BASOS ABS: 0 10*3/uL (ref 0.0–0.1)
BASOS PCT: 0 %
EOS ABS: 0.1 10*3/uL (ref 0.0–0.7)
EOS PCT: 1 %
HCT: 36.3 % (ref 36.0–46.0)
HEMOGLOBIN: 11.6 g/dL — AB (ref 12.0–15.0)
Lymphocytes Relative: 29 %
Lymphs Abs: 2.6 10*3/uL (ref 0.7–4.0)
MCH: 29.1 pg (ref 26.0–34.0)
MCHC: 32 g/dL (ref 30.0–36.0)
MCV: 91 fL (ref 78.0–100.0)
Monocytes Absolute: 0.6 10*3/uL (ref 0.1–1.0)
Monocytes Relative: 7 %
NEUTROS PCT: 63 %
Neutro Abs: 5.7 10*3/uL (ref 1.7–7.7)
PLATELETS: 265 10*3/uL (ref 150–400)
RBC: 3.99 MIL/uL (ref 3.87–5.11)
RDW: 13.4 % (ref 11.5–15.5)
WBC: 9 10*3/uL (ref 4.0–10.5)

## 2017-09-25 LAB — GLUCOSE, CAPILLARY
GLUCOSE-CAPILLARY: 134 mg/dL — AB (ref 65–99)
GLUCOSE-CAPILLARY: 203 mg/dL — AB (ref 65–99)
Glucose-Capillary: 59 mg/dL — ABNORMAL LOW (ref 65–99)
Glucose-Capillary: 140 mg/dL — ABNORMAL HIGH (ref 65–99)
Glucose-Capillary: 121 mg/dL — ABNORMAL HIGH (ref 65–99)
Glucose-Capillary: 198 mg/dL — ABNORMAL HIGH (ref 65–99)

## 2017-09-25 LAB — PHOSPHORUS: PHOSPHORUS: 3 mg/dL (ref 2.5–4.6)

## 2017-09-25 LAB — MAGNESIUM: MAGNESIUM: 1.7 mg/dL (ref 1.7–2.4)

## 2017-09-25 LAB — BASIC METABOLIC PANEL
ANION GAP: 9 (ref 5–15)
BUN: 21 mg/dL — AB (ref 6–20)
CHLORIDE: 100 mmol/L — AB (ref 101–111)
CO2: 27 mmol/L (ref 22–32)
Calcium: 8.5 mg/dL — ABNORMAL LOW (ref 8.9–10.3)
Creatinine, Ser: 0.65 mg/dL (ref 0.44–1.00)
Glucose, Bld: 103 mg/dL — ABNORMAL HIGH (ref 65–99)
Potassium: 3.6 mmol/L (ref 3.5–5.1)
SODIUM: 136 mmol/L (ref 135–145)

## 2017-09-25 MED ORDER — DIVALPROEX SODIUM 250 MG PO DR TAB: 500.0000 mg | DELAYED_RELEASE_TABLET | Freq: Two times a day (BID) | ORAL | Status: DC

## 2017-09-25 MED ORDER — PROPRANOLOL HCL 40 MG PO TABS: 40.0000 mg | ORAL_TABLET | Freq: Two times a day (BID) | ORAL | Status: DC

## 2017-09-25 NOTE — Discharge Instructions (Signed)
Seizure, Adult  Per [facility] statutes, patients with seizures are not allowed to drive until they have been seizure-free for six months. Use caution when using heavy equipment or power tools. Avoid working on ladders or at heights. Take showers instead of baths. Ensure the water temperature is not too high on the home water heater. Do not go swimming alone. Do not lock yourself in a room alone (i.e. bathroom). When caring for infants or small children, sit down when holding, feeding, or changing them to minimize risk of injury to the child in the event you have a seizure. Maintain good sleep hygiene. Avoid alcohol.   A seizure is a sudden burst of abnormal electrical activity in the brain. The abnormal activity temporarily interrupts normal brain function, causing a person to experience any of the following:  Involuntary movements.  Changes in awareness or consciousness.  Uncontrollable shaking (convulsions).  Seizures usually last from 30 seconds to 2 minutes. They usually do not cause permanent brain damage unless they are prolonged. What can cause a seizure to happen? Seizures can happen for many reasons including:  A fever.  Low blood sugar.  A medicine.  An illnesses.  A brain injury.  Some people who have a seizure never have another one. People who have repeated seizures have a condition called epilepsy. What are the symptoms of a seizure? Symptoms of a seizure vary greatly from person to person. They include:  Convulsions.  Stiffening of the body.  Involuntary movements of the arms or legs.  Loss of consciousness.  Breathing problems.  Falling suddenly.  Confusion.  Head nodding.  Eye blinking or fluttering.  Lip smacking.  Drooling.  Rapid eye movements.  Grunting.  Loss of bladder control and bowel control.  Staring.  Unresponsiveness.  Some people have symptoms right before a seizure happens (aura) and right after a seizure happens.  Symptoms of an aura include:  Fear or anxiety.  Nausea.  Feeling like the room is spinning (vertigo).  A feeling of having seen or heard something before (deja vu).  Odd tastes or smells.  Changes in vision, such as seeing flashing lights or spots.  Symptoms that may follow a seizure include:  Confusion.  Sleepiness.  Headache.  Weakness of one side of the body.  Follow these instructions at home: Medicines   Take over-the-counter and prescription medicines only as told by your health care provider.  Avoid any substances that may prevent your medicine from working properly, such as alcohol. Activity  Do not drive, swim, or do any other activities that would be dangerous if you had another seizure. Wait until your health care provider approves.  If you live in the [place] check with your local DMV (department of motor vehicles) to find out about the local driving laws. Each state has specific rules about when you can legally return to driving.  Get enough rest. Lack of sleep can make seizures more likely to occur. Educating others Teach friends and family what to do if you have a seizure. They should:  Lay you on the ground to prevent a fall.  Cushion your head and body.  Loosen any tight clothing around your neck.  Turn you on your side. If vomiting occurs, this helps keep your airway clear.  Stay with you until you recover.  Not hold you down. Holding you down will not stop the seizure.  Not put anything in your mouth.  Know whether or not you need emergency care.  General  instructions  Contact your health care provider each time you have a seizure.  Avoid anything that has ever triggered a seizure for you.  Keep a seizure diary. Record what you remember about each seizure, especially anything that might have triggered the seizure.  Keep all follow-up visits as told by your health care provider. This is important. Contact a health care provider  if:  You have another seizure.  You have seizures more often.  Your seizure symptoms change.  You continue to have seizures with treatment.  You have symptoms of an infection or illness. They might increase your risk of having a seizure. Get help right away if:  You have a seizure: ? That lasts longer than 5 minutes. ? That is different than previous seizures. ? That leaves you unable to speak or use a part of your body. ? That makes it harder to breathe. ? After a head injury.  You have: ? Multiple seizures in a row. ? Confusion or a severe headache right after a seizure.  You are having seizures more often.  You do not wake up immediately after a seizure.  You injure yourself during a seizure. These symptoms may represent a serious problem that is an emergency. Do not wait to see if the symptoms will go away. Get medical help right away. Call your local emergency services (911 in the [place] Do not drive yourself to the hospital. This information is not intended to replace advice given to you by your health care provider. Make sure you discuss any questions you have with your health care provider. Document Released: 07/10/2000 Document Revised: 03/08/2016 Document Reviewed: 02/14/2016 Elsevier Interactive Patient Education  2018 [employer]   Benzodiazepine Withdrawal Benzodiazepines are prescription medicines that decrease the activity of (depress) the central nervous system and cause changes in certain brain chemicals (neurotransmitters). Withdrawal is a group of physical and mental symptoms that can happen when you suddenly stop taking a medicine. There are many types of benzodiazepines. Some benzodiazepines take effect quickly and stay in your system for a short amount of time (short-acting). Other benzodiazepines require more time to take effect and stay in your system for longer amounts of time (long-acting). The five most commonly prescribed benzodiazepines  are:  Alprazolam.  Lorazepam.  Clonazepam.  Diazepam.  Temazepam.  What are the causes? When you take benzodiazepines, your brain needs more and more of the medicine over time in order to get the same effects from it. This increased need is called tolerance. As you develop a tolerance, your brain adapts to the effects of the benzodiazepine and relies on these effects. This is called dependency. Withdrawal happens when you suddenly stop taking your medicine. This does not give your brain enough time to adapt to not having the medicine. What increases the risk? This condition is more likely to develop in:  People who have taken benzodiazepines for more than 1-2 weeks.  People who have developed a tolerance for benzodiazepines.  People who have developed a dependence on benzodiazepines.  People who take high dosages of benzodiazepines.  People who take doses of benzodiazepines that are higher than prescribed.  People who take benzodiazepines without a prescription.  People who use benzodiazepines with other substances that depress the central nervous system, such as alcohol.  People who have a history of drug or alcohol abuse.  What are the signs or symptoms? Symptoms of this condition may include:  Difficulty sleeping.  Anxiety.  Restlessness.  Irritability.  Muscle aches.  Involuntary shaking or trembling of a body part (tremor).  Confusion and poor concentration.  Vomiting.  Sweating.  Headaches.  Feeling or seeing things that are not there (hallucinations).  Seizures.  Symptoms of withdrawal from short-acting benzodiazepines may develop 1-2 days after you stop taking your medicine, and they may last for 2-4 weeks or longer. Symptoms of withdrawal from long-acting benzodiazepines may develop 2-7 days after you stop taking your medicine, and they may last for 2-8 weeks or longer. How is this diagnosed? This condition may be diagnosed based on:  Your  symptoms.  A physical exam. Your health care provider may check for: ? Rapid heartbeat. ? Rapid breathing. ? Tremors. ? High blood pressure.  Blood tests.  Urine tests.  Your alcohol and drug habits.  Your medical history.  How is this treated? Treatment for this condition depends on:  Your symptoms.  The type of benzodiazepine you have been taking.  How long you have been taking benzodiazepines.  Treatment usually involves starting you on a safe and stable dose of a benzodiazepine and then slowly lowering your dosage over time (tapered withdrawal). This may be done at a hospital or a treatment center. Long-term treatment for this condition may involve medicine, counseling, and support groups. Follow these instructions at home:  Take over-the-counter and prescription medicines only as told by your health care provider.  Check with your health care provider before starting new medicines.  Keep all follow-up visits as told by your health care provider. This is important. How is this prevented?  Do not take any benzodiazepines without a prescription.  Do not take more than your prescribed dosage.  Do not mix benzodiazepines with alcohol or other medicines.  Do not stop taking benzodiazepines without speaking with your health care provider. Contact a health care provider if:  You are not able to take your medicines as told by your health care provider.  You have symptoms that get worse.  You develop withdrawal symptoms during your tapered withdrawal.  You develop a craving for drugs or alcohol.  You experience withdrawal again (relapse). Get help right away if:  You have a seizure.  You become very confused.  You lose consciousness.  You have difficulty breathing.  You have serious thoughts about hurting yourself or someone else. This information is not intended to replace advice given to you by your health care provider. Make sure you discuss any  questions you have with your health care provider. Document Released: 07/02/2011 Document Revised: 11/21/2015 Document Reviewed: 01/02/2015 Elsevier Interactive Patient Education  [employer].

## 2017-09-25 NOTE — Progress Notes (Signed)
Patient trans in from [facility] to [facility]. On arrival, patient alert and oriented. Vss checked, cardiac monitor applied and safety measures put in place.Marland Kitchen..Marland Kitchen

## 2017-09-25 NOTE — Progress Notes (Signed)
Patient is being transferred to [address] [facility]. Report called to the receiving RN.
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
## 2017-09-25 NOTE — Progress Notes (Signed)
Discharge instructions provided. Pt taken down with all of belongings via wheelchair.
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
## 2017-09-26 LAB — HIV ANTIBODY (ROUTINE TESTING W REFLEX): HIV Screen 4th Generation wRfx: NONREACTIVE

## 2017-09-27 ENCOUNTER — Telehealth: Payer: Self-pay | Admitting: *Deleted

## 2017-09-27 NOTE — Telephone Encounter (Signed)
thx

## 2017-09-27 NOTE — Telephone Encounter (Signed)
Adrienne Bennett  MR#: [id number]  DOB:Aug 20, 1960  Date of Admission: 09/23/2017 Date of Discharge: 09/25/2017  Attending Physician:Jeffrey T McClung  Patient's PCP:Paz, Nolon RodJose E, MD  Consults: Neurology PCCM  Disposition: D/C home   Transition Care Management Follow-up Telephone Call   How have you been since you were released from the hospital? Just a little tired.   Do you understand why you were in the hospital? yes   Do you understand the discharge instructions? yes   Where were you discharged to? Home with husband  Items Reviewed:  Medications reviewed: no, States husband takes care of her medications and will be coming with her to visit.  Allergies reviewed: yes  Dietary changes reviewed: yes  Referrals reviewed: yes   Functional Questionnaire:   Activities of Daily Living (ADLs):   She states they are independent in the following: ambulation, bathing and hygiene, feeding, continence, grooming, toileting and dressing States they require assistance with the following: n/a   Any transportation issues/concerns?: yes   Any patient concerns? Pt states she feels like her memory is worse since the seizure.    Confirmed importance and date/time of follow-up visits scheduled yes Provider Appointment booked with Dr.Paz 09/28/17 @4pm   Confirmed with patient if condition begins to worsen call PCP or go to the ER.  Patient was given the office number and encouraged to call back with question or concerns.  : yes

## 2017-09-28 ENCOUNTER — Encounter: Payer: Self-pay | Admitting: Internal Medicine

## 2017-09-28 ENCOUNTER — Ambulatory Visit (INDEPENDENT_AMBULATORY_CARE_PROVIDER_SITE_OTHER): Payer: Medicare Other | Admitting: Internal Medicine

## 2017-09-28 VITALS — BP 128/68 | HR 55 | Temp 97.6°F | Resp 14 | Ht 61.0 in | Wt 137.2 lb

## 2017-09-28 DIAGNOSIS — D649 Anemia, unspecified: Secondary | ICD-10-CM

## 2017-09-28 DIAGNOSIS — I10 Essential (primary) hypertension: Secondary | ICD-10-CM | POA: Diagnosis not present

## 2017-09-28 DIAGNOSIS — G40901 Epilepsy, unspecified, not intractable, with status epilepticus: Secondary | ICD-10-CM

## 2017-09-28 DIAGNOSIS — IMO0002 Reserved for concepts with insufficient information to code with codable children: Secondary | ICD-10-CM

## 2017-09-28 DIAGNOSIS — E1365 Other specified diabetes mellitus with hyperglycemia: Secondary | ICD-10-CM

## 2017-09-28 DIAGNOSIS — R7989 Other specified abnormal findings of blood chemistry: Secondary | ICD-10-CM
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
## 2017-09-28 NOTE — Progress Notes (Signed)
Pre visit review using our clinic review tool, if applicable. No additional management support is needed unless otherwise documented below in the visit note. 

## 2017-09-28 NOTE — Patient Instructions (Signed)
GO TO THE LAB : Get the blood work     GO TO THE FRONT DESK Schedule your next appointment for a checkup in 3 months   We are referring you to neurology, if you don't hear from them in the next few days please call us   Check the  blood pressure   weekly   Be sure your blood pressure is between 110/65 and  135/85. If it is consistently higher or lower, let me know

## 2017-09-29 ENCOUNTER — Encounter: Payer: Self-pay | Admitting: Internal Medicine

## 2017-09-29 LAB — CBC WITH DIFFERENTIAL/PLATELET
BASOS PCT: 0.8 % (ref 0.0–3.0)
Basophils Absolute: 0.1 10*3/uL (ref 0.0–0.1)
EOS ABS: 0.1 10*3/uL (ref 0.0–0.7)
Eosinophils Relative: 1.8 % (ref 0.0–5.0)
HCT: 37.4 % (ref 36.0–46.0)
HEMOGLOBIN: 12.3 g/dL (ref 12.0–15.0)
Lymphocytes Relative: 29.4 % (ref 12.0–46.0)
Lymphs Abs: 2.1 10*3/uL (ref 0.7–4.0)
MCHC: 32.9 g/dL (ref 30.0–36.0)
MCV: 89.3 fl (ref 78.0–100.0)
MONO ABS: 0.5 10*3/uL (ref 0.1–1.0)
Monocytes Relative: 7 % (ref 3.0–12.0)
Neutro Abs: 4.4 10*3/uL (ref 1.4–7.7)
Neutrophils Relative %: 61 % (ref 43.0–77.0)
PLATELETS: 311 10*3/uL (ref 150.0–400.0)
RBC: 4.19 Mil/uL (ref 3.87–5.11)
RDW: 13.8 % (ref 11.5–15.5)
WBC: 7.2 10*3/uL (ref 4.0–10.5)

## 2017-09-29 LAB — HEMOGLOBIN A1C: HEMOGLOBIN A1C: 7.6 % — AB (ref 4.6–6.5)

## 2017-09-29 LAB — T3, FREE: T3, Free: 3 pg/mL (ref 2.3–4.2)

## 2017-09-29 LAB — T4, FREE: FREE T4: 1.2 ng/dL (ref 0.60–1.60)

## 2017-09-29 LAB — TSH: TSH: 0.28 u[IU]/mL — AB (ref 0.35–4.50)

## 2017-09-29 NOTE — Assessment & Plan Note (Signed)
Status epilepticus.  Admitted to the hospital with a seizure felt to be from withdrawal from Xanax and Depakote.  CT head, EEG were essentially unremarkable. No further episodes, she is somewhat lethargic but otherwise doing well. Is extremely important she does not run out of Xanax and Depakote, the problem was that her husband had a medical emergency and was not able to arrange medications for her.  I strongly rec them to have a backup plan. Arrange neurology referral for a hospital follow-up. HTN: Currently on amlodipine, Inderal.  Patient has been reports that she has been taking Inderal 10 mg 1 tablet BID for the longest time despite the sig being  2 tablets BID; since she left the hospital she is indeed taking 2 tablets BID; pt wonders if lethargy is d/t increase BB dose; ? decrease the dose. Agreed to decrease 1 inderal bid, watch BPs. DM: Continue metformin, Actos.  Check a A1c Suppressed TSH: Check a TSH, T3-T4 Mild decreased hemoglobin: Recheck a CBC Addendum: Also reports watery, brownish diarrhea since she left the hospital.  No fever, chills, abdominal pain no blood in the stools.  Recommend OTCs for now, if not better will have to do stool testing, rule out C. difficile given recent admission. RTC 3 months

## 2017-10-03 ENCOUNTER — Encounter: Payer: Self-pay | Admitting: Internal Medicine

## 2017-10-04 MED ORDER — METFORMIN HCL ER 500 MG PO TB24: 500.0000 mg | ORAL_TABLET | Freq: Two times a day (BID) | ORAL | 6 refills | Status: DC

## 2017-10-04 NOTE — Telephone Encounter (Signed)
See message, change to metformin ER 500 mg 1 tablet twice a day #60 and 6 refills  Received: Today  Message Contents  Wanda PlumpPaz, Jose E, MD  Conrad [place]lingtonanter, Doria Fern, CMA

## 2017-10-07 ENCOUNTER — Ambulatory Visit: Payer: Self-pay | Admitting: Neurology

## 2017-10-08 NOTE — Addendum Note (Signed)
Addended byConrad [place]: Maureen Duesing D on: 10/08/2017 01:06 PM   Modules accepted: Orders

## 2017-10-09 ENCOUNTER — Encounter: Payer: Self-pay | Admitting: Internal Medicine

## 2017-10-14 ENCOUNTER — Encounter: Payer: Self-pay | Admitting: Family Medicine

## 2017-10-15 ENCOUNTER — Encounter: Payer: Self-pay | Admitting: Neurology

## 2017-10-18 ENCOUNTER — Other Ambulatory Visit: Payer: Self-pay | Admitting: Internal Medicine

## 2017-11-09 ENCOUNTER — Encounter: Payer: Self-pay | Admitting: Internal Medicine

## 2017-12-02 ENCOUNTER — Encounter: Payer: Self-pay | Admitting: Internal Medicine

## 2017-12-08 ENCOUNTER — Other Ambulatory Visit: Payer: Self-pay | Admitting: Internal Medicine

## 2017-12-24 ENCOUNTER — Other Ambulatory Visit: Payer: Self-pay | Admitting: Internal Medicine

## 2017-12-30 ENCOUNTER — Ambulatory Visit: Payer: Medicare Other | Admitting: Internal Medicine

## 2017-12-31 ENCOUNTER — Encounter: Payer: Self-pay | Admitting: Internal Medicine

## 2017-12-31 ENCOUNTER — Ambulatory Visit (INDEPENDENT_AMBULATORY_CARE_PROVIDER_SITE_OTHER): Payer: Medicare Other | Admitting: Internal Medicine

## 2017-12-31 VITALS — BP 152/86 | HR 81 | Temp 98.1°F | Resp 16 | Ht 61.0 in | Wt 144.0 lb

## 2017-12-31 DIAGNOSIS — I10 Essential (primary) hypertension: Secondary | ICD-10-CM

## 2017-12-31 DIAGNOSIS — F3174 Bipolar disorder, in full remission, most recent episode manic: Secondary | ICD-10-CM

## 2017-12-31 DIAGNOSIS — R7989 Other specified abnormal findings of blood chemistry: Secondary | ICD-10-CM

## 2017-12-31 DIAGNOSIS — E1365 Other specified diabetes mellitus with hyperglycemia: Secondary | ICD-10-CM

## 2017-12-31 DIAGNOSIS — IMO0002 Reserved for concepts with insufficient information to code with codable children: Secondary | ICD-10-CM
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
## 2017-12-31 NOTE — Patient Instructions (Addendum)
   GO TO THE FRONT DESK Schedule nurse appointment 6 weeks from today to check your blood pressure. Schedule your next appointment for a physical exam by November 2019

## 2018-01-02 NOTE — Assessment & Plan Note (Signed)
Status epilepticus: Doing well, to see neurology next week HTN: See last visit, Inderal decreased to 1 tablet B.I.D. due to fatigue, fatigue decreased, she continue on amlodipine.  BP today is 152, reports she is unable to check her BPs at home.  Recommend no change for now, come back for a nurse visit in 6 weeks for BP check DM: Tolerating extended-release metformin very well, also on Actos, last A1c improved.  Feet exam negative today. Decreased TSH: To see Endo in few weeks. Bipolar: Per psychiatry, reports that she is off Wellbutrin and Xanax and taking clonazepam, also on temazepam for insomnia RTC 6 weeks for a BP check RTC November 2019 for a physical

## 2018-01-03 DIAGNOSIS — N958 Other specified menopausal and perimenopausal disorders: Secondary | ICD-10-CM | POA: Diagnosis not present

## 2018-01-03 DIAGNOSIS — Z1231 Encounter for screening mammogram for malignant neoplasm of breast: Secondary | ICD-10-CM | POA: Diagnosis not present

## 2018-01-03 LAB — HM MAMMOGRAPHY

## 2018-01-04 ENCOUNTER — Encounter: Payer: Self-pay | Admitting: Neurology

## 2018-01-04 ENCOUNTER — Ambulatory Visit (INDEPENDENT_AMBULATORY_CARE_PROVIDER_SITE_OTHER): Payer: Medicare Other | Admitting: Neurology

## 2018-01-04 ENCOUNTER — Other Ambulatory Visit: Payer: Self-pay

## 2018-01-04 VITALS — BP 154/86 | HR 80 | Ht 60.0 in | Wt 145.0 lb

## 2018-01-04 DIAGNOSIS — R569 Unspecified convulsions: Secondary | ICD-10-CM | POA: Diagnosis not present

## 2018-01-04 NOTE — Patient Instructions (Signed)
1. Continue all your medications, do not stop any medication suddenly 2. Call our office on August 28 to confirm no further seizures and [facility] 3. Follow-up in 6 months, call for any changes  Seizure Precautions: 1. If medication has been prescribed for you to prevent seizures, take it exactly as directed.  Do not stop taking the medicine without talking to your doctor first, even if you have not had a seizure in a long time.   2. Avoid activities in which a seizure would cause danger to yourself or to others.  Don't operate dangerous machinery, swim alone, or climb in high or dangerous places, such as on ladders, roofs, or girders.  Do not drive unless your doctor says you may.  3. If you have any warning that you may have a seizure, lay down in a safe place where you can't hurt yourself.    4.  No driving for 6 months from last seizure, as [place].   Please refer to the following link on the [employer]'s website for more information: http://www.epilepsyfoundation.org/answerplace/Social/driving/drivingu.cfm   5.  Maintain good sleep hygiene. Avoid alcohol.  6.  Notify your neurology if you are planning pregnancy or if you become pregnant.  7.  Contact your doctor if you have any problems that may be related to the medicine you are taking.  8.  Call 911 and bring the patient back to the ED if:        A.  The seizure lasts longer than 5 minutes.       B.  The patient doesn't awaken shortly after the seizure  C.  The patient has new problems such as difficulty seeing, speaking or moving  D.  The patient was injured during the seizure  E.  The patient has a temperature over 102 F (39C)  F.  The patient vomited and now is having trouble breathing
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
## 2018-01-19 ENCOUNTER — Encounter: Payer: Self-pay | Admitting: Internal Medicine

## 2018-01-20 DIAGNOSIS — E0590 Thyrotoxicosis, unspecified without thyrotoxic crisis or storm: Secondary | ICD-10-CM | POA: Diagnosis not present

## 2018-01-20 DIAGNOSIS — E042 Nontoxic multinodular goiter: Secondary | ICD-10-CM | POA: Diagnosis not present

## 2018-01-29 ENCOUNTER — Encounter: Payer: Self-pay | Admitting: Neurology

## 2018-02-01 ENCOUNTER — Ambulatory Visit: Payer: Medicare Other | Admitting: Endocrinology

## 2018-02-03 ENCOUNTER — Encounter: Payer: Self-pay | Admitting: Family Medicine

## 2018-02-06 ENCOUNTER — Encounter: Payer: Self-pay | Admitting: Internal Medicine

## 2018-02-07 ENCOUNTER — Telehealth: Payer: Self-pay

## 2018-02-07 NOTE — Telephone Encounter (Signed)
Can cancel appt for 02/11/2018. Thank you.

## 2018-02-07 NOTE — Telephone Encounter (Signed)
Pt sent MyChart message regarding elevated blood sugars in ~400-500. PCP would like to see her tomorrow. Please call and schedule visit at her convenience.

## 2018-02-07 NOTE — Telephone Encounter (Signed)
Pt scheduled for 02/08/18 @ 10:40am. She is scheduled for a nurse visit for 02/11/18 should she keep this appt since she is being seen tomorrow?

## 2018-02-07 NOTE — Telephone Encounter (Signed)
Appointment cancelled

## 2018-02-08 ENCOUNTER — Ambulatory Visit (INDEPENDENT_AMBULATORY_CARE_PROVIDER_SITE_OTHER): Payer: Medicare Other | Admitting: Internal Medicine

## 2018-02-08 ENCOUNTER — Encounter: Payer: Self-pay | Admitting: Internal Medicine

## 2018-02-08 VITALS — BP 134/68 | HR 77 | Temp 97.8°F | Resp 16 | Ht 60.0 in | Wt 142.0 lb

## 2018-02-08 DIAGNOSIS — IMO0002 Reserved for concepts with insufficient information to code with codable children: Secondary | ICD-10-CM

## 2018-02-08 DIAGNOSIS — I10 Essential (primary) hypertension: Secondary | ICD-10-CM

## 2018-02-08 DIAGNOSIS — E1165 Type 2 diabetes mellitus with hyperglycemia: Secondary | ICD-10-CM

## 2018-02-08 DIAGNOSIS — E1365 Other specified diabetes mellitus with hyperglycemia: Secondary | ICD-10-CM

## 2018-02-08 LAB — POCT GLUCOSE (DEVICE FOR HOME USE): Glucose Fasting, POC: 324 mg/dL — AB (ref 70–99)

## 2018-02-08 MED ORDER — PIOGLITAZONE HCL 30 MG PO TABS: 30.0000 mg | ORAL_TABLET | Freq: Every day | ORAL | 5 refills | Status: DC

## 2018-02-08 NOTE — Patient Instructions (Addendum)
Schedule a nurse visit 10 days to 2 weeks from today.  They will check your blood pressure, please provide the nurse with your blood sugar readings   Go back on actos (pioglotazone) and continue all other medicines   Please read the medication list   Diabetes: Check your blood sugar  Once or twice  a day     at different times of the day  GOALS: Fasting before a meal 90- 130 2 hours after a meal less than 180 At bedtime 90-150  Call with readings in 10 days   DIABETES self learn tools: Online resources: The American diabetes Association     diabetes.org  Visit [facility] website it is a [employer] resource  [facility].org  The [facility] web site has a diabetes section  [email or web address]
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
## 2018-02-08 NOTE — Progress Notes (Signed)
Pre visit review using our clinic review tool, if applicable. No additional management support is needed unless otherwise documented below in the visit note. 

## 2018-02-09 LAB — HM DIABETES EYE EXAM

## 2018-02-09 NOTE — Assessment & Plan Note (Signed)
DM: Good compliance with Januvia and metformin but has not been taking Actos in a while.  CBG at the office 224. Rec: Go back on Actos, good nutrition's, increase physical activity, check CBGs, bring readings in 10 to 14 days. HTN: Good med compliance, reports BP was elevated 1 time few days ago, BP today is very good.  No change. Status epilepticus?  Saw neurology, seizure felt to be due to medication withdrawal.  Patient is limiting her physical activity because she is afraid of another seizure, advised that as long as she takes medication she should be okay and encouraged her to start to be active again and venture out of her house.

## 2018-02-10 ENCOUNTER — Encounter: Payer: Self-pay | Admitting: Internal Medicine

## 2018-02-11 ENCOUNTER — Ambulatory Visit: Payer: Medicare Other

## 2018-02-17 ENCOUNTER — Other Ambulatory Visit: Payer: Self-pay | Admitting: Internal Medicine

## 2018-02-18 ENCOUNTER — Ambulatory Visit (INDEPENDENT_AMBULATORY_CARE_PROVIDER_SITE_OTHER): Payer: Medicare Other | Admitting: Family Medicine

## 2018-02-18 ENCOUNTER — Other Ambulatory Visit: Payer: Self-pay | Admitting: Internal Medicine

## 2018-02-18 DIAGNOSIS — I10 Essential (primary) hypertension: Secondary | ICD-10-CM | POA: Diagnosis not present

## 2018-02-18 NOTE — Progress Notes (Signed)
Pre visit review using our clinic tool,if applicable. No additional management support is needed unless otherwise documented below in the visit note.   Pt here for Blood pressure check per orders from Dr. Willow OraJose Paz. Patient did not take BP medication prior to coming in today. States Blood sugars have been running high for the past few days.  Blood sugar today = 410 fasting. Has seen Endocrinology and has Thyroid testing on Monday.    Pt currently takes:Propanolol 10 mg states they stopped Amlodipine 10 mg. Patient has not taken BP medication today.  BP today  = 179/104 P = 80  Pt advised per Y.Lowne-Chase patient to come in to see provider early next week. Restart Amlodipine and take Actos  As well as other prescribed medications.  Patient agreed.

## 2018-02-18 NOTE — Progress Notes (Signed)
Reviewed  Ashley Bultema R Lowne Chase, DO  

## 2018-02-18 NOTE — Telephone Encounter (Signed)
Copied from CRM [id number]. Topic: Quick Communication - Rx Refill/Question >> Feb 18, 2018  3:58 PM Raoul PitchWilliams-Neal, Sade R wrote: Medication:amLODipine (NORVASC) 10 MG tablet  Has the patient contacted their pharmacy? No Preferred Pharmacy (with phone number or street name): [facility] - [place], [place] - [id number] [phone] (Phone) [phone] (Fax)      Agent: Please be advised that RX refills may take up to 3 business days. We ask that you follow-up with your pharmacy.

## 2018-02-21 DIAGNOSIS — E042 Nontoxic multinodular goiter: Secondary | ICD-10-CM | POA: Diagnosis not present

## 2018-02-21 DIAGNOSIS — E0590 Thyrotoxicosis, unspecified without thyrotoxic crisis or storm: Secondary | ICD-10-CM | POA: Diagnosis not present

## 2018-02-21 MED ORDER — AMLODIPINE BESYLATE 10 MG PO TABS: 10.0000 mg | ORAL_TABLET | Freq: Every day | ORAL | 0 refills | Status: DC

## 2018-02-21 NOTE — Telephone Encounter (Signed)
Amlodipine refill Last OV:02/18/18; Upcoming 02/25/18 Last refill:01/25/17 (expired) PCP:Paz [facility] [place], [place] - [id number] [phone] (Phone) [phone] (Fax)

## 2018-02-22 DIAGNOSIS — E042 Nontoxic multinodular goiter: Secondary | ICD-10-CM | POA: Diagnosis not present

## 2018-02-25 ENCOUNTER — Encounter: Payer: Self-pay | Admitting: Internal Medicine

## 2018-02-25 ENCOUNTER — Ambulatory Visit (INDEPENDENT_AMBULATORY_CARE_PROVIDER_SITE_OTHER): Payer: Medicare Other | Admitting: Internal Medicine

## 2018-02-25 VITALS — BP 142/98 | HR 67 | Temp 97.8°F | Resp 16 | Ht 60.0 in | Wt 145.1 lb

## 2018-02-25 DIAGNOSIS — E0590 Thyrotoxicosis, unspecified without thyrotoxic crisis or storm: Secondary | ICD-10-CM

## 2018-02-25 DIAGNOSIS — E1365 Other specified diabetes mellitus with hyperglycemia: Secondary | ICD-10-CM | POA: Diagnosis not present

## 2018-02-25 DIAGNOSIS — IMO0002 Reserved for concepts with insufficient information to code with codable children: Secondary | ICD-10-CM

## 2018-02-25 LAB — POCT GLUCOSE (DEVICE FOR HOME USE): Glucose Fasting, POC: 357 mg/dL — AB (ref 70–99)

## 2018-02-25 LAB — BASIC METABOLIC PANEL
BUN: 20 mg/dL (ref 6–23)
CALCIUM: 9.4 mg/dL (ref 8.4–10.5)
CO2: 37 mEq/L — ABNORMAL HIGH (ref 19–32)
Chloride: 93 mEq/L — ABNORMAL LOW (ref 96–112)
Creatinine, Ser: 0.7 mg/dL (ref 0.40–1.20)
GFR: 91.36 mL/min (ref >60.00)
Glucose, Bld: 346 mg/dL — ABNORMAL HIGH (ref 70–99)
Potassium: 4.8 mEq/L (ref 3.5–5.1)
Sodium: 136 mEq/L (ref 135–145)

## 2018-02-25 LAB — HEMOGLOBIN A1C: Hgb A1c MFr Bld: 13.2 % — ABNORMAL HIGH (ref 4.6–6.5)

## 2018-02-25 MED ORDER — AMLODIPINE BESYLATE 10 MG PO TABS: 10.0000 mg | ORAL_TABLET | Freq: Every day | ORAL | 6 refills | Status: DC

## 2018-02-25 NOTE — Patient Instructions (Addendum)
GO TO THE LAB : Get the blood work     GO TO THE FRONT DESK Schedule your next appointment for a checkup in 1 month  Take all your medications according to the list provided   Check the  blood pressure   daily Be sure your blood pressure is between 110/65 and  135/85. Call with readings in 2 weeks Call anytime if it is consistently more than 160
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
## 2018-02-25 NOTE — Progress Notes (Signed)
Pre visit review using our clinic review tool, if applicable. No additional management support is needed unless otherwise documented below in the visit note. 

## 2018-02-26 DIAGNOSIS — E0590 Thyrotoxicosis, unspecified without thyrotoxic crisis or storm: Secondary | ICD-10-CM

## 2018-02-26 HISTORY — DX: Thyrotoxicosis, unspecified without thyrotoxic crisis or storm: E05.90

## 2018-02-26 NOTE — Assessment & Plan Note (Addendum)
DM: Reports good compliance with metformin XR 500 twice a day, Actos, Januvia.  A1c 09/2017 was 7.6.  CBGs at home 300, 500.  CBG today fasting 357.  We will check a A1c, BMP, patient most likely will need either the addition of another po med  Or a injectable.  Long and short-term risks of uncontrolled diabetes discussed, including CAD, DKA and death, strokes, etc.  Patient states she will not take injectables thus I will strongly consider let Endocrinology manage DM. HTN: Reports she is taking Inderal prescribed but for the last year (unknown to me) has not taking amlodipine, reports that the pharmacy for one reason or another is not proviging it ; a RX for amlodipine printed.  Check BPs, call with readings in 2 weeks.  Follow-up 1 month [facility]thyroidism: [facility], after additional blood work they recommended I 131 uptake scan RTC 1 month

## 2018-02-28 NOTE — Addendum Note (Signed)
Addended byConrad Alianza: Mitsuye Schrodt D on: 02/28/2018 08:35 AM   Modules accepted: Orders

## 2018-03-11 ENCOUNTER — Encounter: Payer: Self-pay | Admitting: Internal Medicine

## 2018-03-22 DIAGNOSIS — H353131 Nonexudative age-related macular degeneration, bilateral, early dry stage: Secondary | ICD-10-CM | POA: Diagnosis not present

## 2018-03-23 ENCOUNTER — Telehealth: Payer: Self-pay | Admitting: Neurology

## 2018-03-23 NOTE — Telephone Encounter (Signed)
I have also received mychart message from pt about this.  I replied that I will have DMV paperwork faxed out before the end of day today.

## 2018-03-23 NOTE — Telephone Encounter (Signed)
Patient called states that she has not had a seizure since her last appt 6 months ago

## 2018-03-24 ENCOUNTER — Encounter: Payer: Self-pay | Admitting: Internal Medicine

## 2018-03-28 ENCOUNTER — Other Ambulatory Visit: Payer: Self-pay | Admitting: Internal Medicine

## 2018-03-29 ENCOUNTER — Telehealth: Payer: Self-pay

## 2018-03-29 NOTE — Telephone Encounter (Signed)
Pt. Called to report she was out of her amlodipine. Triage checked with [facility] and they report she received # 90 pills in July and shouls still have some left. Spoke with pt. And she found her medication. States my husband had put it up. Told agent her BS was 500 last night. Has not checked her BS today - "I'll do it later. It's been running high." Reports she has an appointment this Thursday with Dr. [name]. Instructed to bring her log of her BS readings. Verbalizes understanding. Instructed if her blood sugars run over 300 to call office.

## 2018-03-31 ENCOUNTER — Ambulatory Visit (INDEPENDENT_AMBULATORY_CARE_PROVIDER_SITE_OTHER): Payer: Medicare Other | Admitting: Internal Medicine

## 2018-03-31 ENCOUNTER — Encounter: Payer: Self-pay | Admitting: Internal Medicine

## 2018-03-31 VITALS — BP 122/66 | HR 66 | Temp 98.2°F | Resp 14 | Ht 60.0 in | Wt 142.0 lb

## 2018-03-31 DIAGNOSIS — I10 Essential (primary) hypertension: Secondary | ICD-10-CM

## 2018-03-31 DIAGNOSIS — E11319 Type 2 diabetes mellitus with unspecified diabetic retinopathy without macular edema: Secondary | ICD-10-CM

## 2018-03-31 DIAGNOSIS — IMO0002 Reserved for concepts with insufficient information to code with codable children: Secondary | ICD-10-CM

## 2018-03-31 DIAGNOSIS — E1365 Other specified diabetes mellitus with hyperglycemia: Secondary | ICD-10-CM | POA: Diagnosis not present

## 2018-03-31 DIAGNOSIS — E13319 Other specified diabetes mellitus with unspecified diabetic retinopathy without macular edema: Secondary | ICD-10-CM

## 2018-03-31 HISTORY — DX: Type 2 diabetes mellitus with unspecified diabetic retinopathy without macular edema: E11.319

## 2018-03-31 NOTE — Assessment & Plan Note (Signed)
DM: Last A1c 13.2, she agreed to see endocrinology, has an appointment later this month. DM retinopathy: Patient reports her last eye exam a week ago showed retinopathy, that reinforces the need to improve her diabetes control. HTN: Seems to be well controlled, continue amlodipine, Inderal Right leg pain: Developed right leg pain, probably sciatica, after she started to exercise in the [facility]; symptoms are gone, I recommend to go back on routine exercise. RTC as a scheduled 05-2018 cpx

## 2018-03-31 NOTE — Progress Notes (Signed)
Pre visit review using our clinic review tool, if applicable. No additional management support is needed unless otherwise documented below in the visit note. 
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
## 2018-04-02 ENCOUNTER — Other Ambulatory Visit: Payer: Self-pay | Admitting: Internal Medicine

## 2018-04-07 ENCOUNTER — Other Ambulatory Visit: Payer: Self-pay | Admitting: Internal Medicine

## 2018-04-14 ENCOUNTER — Telehealth: Payer: Self-pay

## 2018-04-14 NOTE — Telephone Encounter (Signed)
Spoke with pt letting her know that Dr. Karel JarvisAquino has filled out the Neurological section of her DMV paperwork - but that there is 1 section for Dr. Donell BeersPlovsky to fill out.  Let pt know that I will place paperwork at front desk for pick up so that she may take to Dr. Caprice RenshawPlovsky's office. Pt agreeable to this. Copy made of paperwork and placed in scan bin

## 2018-04-18 DIAGNOSIS — E042 Nontoxic multinodular goiter: Secondary | ICD-10-CM | POA: Diagnosis not present

## 2018-04-18 DIAGNOSIS — Z794 Long term (current) use of insulin: Secondary | ICD-10-CM | POA: Diagnosis not present

## 2018-04-18 DIAGNOSIS — E1165 Type 2 diabetes mellitus with hyperglycemia: Secondary | ICD-10-CM | POA: Diagnosis not present

## 2018-04-18 DIAGNOSIS — Z23 Encounter for immunization: Secondary | ICD-10-CM | POA: Diagnosis not present

## 2018-04-18 DIAGNOSIS — E0590 Thyrotoxicosis, unspecified without thyrotoxic crisis or storm: Secondary | ICD-10-CM | POA: Diagnosis not present

## 2018-04-18 DIAGNOSIS — E11319 Type 2 diabetes mellitus with unspecified diabetic retinopathy without macular edema: Secondary | ICD-10-CM | POA: Diagnosis not present

## 2018-04-24 ENCOUNTER — Other Ambulatory Visit: Payer: Self-pay | Admitting: Internal Medicine

## 2018-04-28 DIAGNOSIS — E0510 Thyrotoxicosis with toxic single thyroid nodule without thyrotoxic crisis or storm: Secondary | ICD-10-CM | POA: Diagnosis not present

## 2018-04-28 DIAGNOSIS — E0520 Thyrotoxicosis with toxic multinodular goiter without thyrotoxic crisis or storm: Secondary | ICD-10-CM | POA: Diagnosis not present

## 2018-05-09 ENCOUNTER — Other Ambulatory Visit: Payer: Self-pay

## 2018-05-09 MED ORDER — AMLODIPINE BESYLATE 10 MG PO TABS: 10.0000 mg | ORAL_TABLET | Freq: Every day | ORAL | 1 refills | Status: DC

## 2018-05-19 ENCOUNTER — Other Ambulatory Visit: Payer: Self-pay | Admitting: Internal Medicine

## 2018-06-01 DIAGNOSIS — Z794 Long term (current) use of insulin: Secondary | ICD-10-CM | POA: Diagnosis not present

## 2018-06-01 DIAGNOSIS — E1165 Type 2 diabetes mellitus with hyperglycemia: Secondary | ICD-10-CM | POA: Diagnosis not present

## 2018-06-01 DIAGNOSIS — E0590 Thyrotoxicosis, unspecified without thyrotoxic crisis or storm: Secondary | ICD-10-CM | POA: Diagnosis not present

## 2018-06-01 DIAGNOSIS — E042 Nontoxic multinodular goiter: Secondary | ICD-10-CM | POA: Diagnosis not present

## 2018-06-01 DIAGNOSIS — E11319 Type 2 diabetes mellitus with unspecified diabetic retinopathy without macular edema: Secondary | ICD-10-CM | POA: Diagnosis not present

## 2018-06-04 ENCOUNTER — Encounter: Payer: Self-pay | Admitting: Internal Medicine

## 2018-06-20 ENCOUNTER — Encounter: Payer: Self-pay | Admitting: Internal Medicine

## 2018-06-20 ENCOUNTER — Ambulatory Visit (INDEPENDENT_AMBULATORY_CARE_PROVIDER_SITE_OTHER): Payer: Medicare Other | Admitting: Internal Medicine

## 2018-06-20 VITALS — BP 122/60 | HR 68 | Temp 98.0°F | Resp 16 | Ht 60.0 in | Wt 139.2 lb

## 2018-06-20 DIAGNOSIS — E1365 Other specified diabetes mellitus with hyperglycemia: Secondary | ICD-10-CM

## 2018-06-20 DIAGNOSIS — E785 Hyperlipidemia, unspecified: Secondary | ICD-10-CM | POA: Diagnosis not present

## 2018-06-20 DIAGNOSIS — Z0000 Encounter for general adult medical examination without abnormal findings: Secondary | ICD-10-CM

## 2018-06-20 DIAGNOSIS — F3174 Bipolar disorder, in full remission, most recent episode manic: Secondary | ICD-10-CM | POA: Diagnosis not present

## 2018-06-20 DIAGNOSIS — IMO0002 Reserved for concepts with insufficient information to code with codable children: Secondary | ICD-10-CM

## 2018-06-20 LAB — LIPID PANEL
CHOLESTEROL: 143 mg/dL (ref 0–200)
HDL: 39.4 mg/dL (ref >39.00)
LDL CALC: 79 mg/dL (ref 0–99)
NonHDL: 103.86
TRIGLYCERIDES: 124 mg/dL (ref 0.0–149.0)
Total CHOL/HDL Ratio: 4
VLDL: 24.8 mg/dL (ref 0.0–40.0)

## 2018-06-20 LAB — CBC WITH DIFFERENTIAL/PLATELET
BASOS PCT: 0.5 % (ref 0.0–3.0)
Basophils Absolute: 0 10*3/uL (ref 0.0–0.1)
EOS ABS: 0.1 10*3/uL (ref 0.0–0.7)
EOS PCT: 2.6 % (ref 0.0–5.0)
HEMATOCRIT: 40.4 % (ref 36.0–46.0)
HEMOGLOBIN: 13.6 g/dL (ref 12.0–15.0)
LYMPHS PCT: 32.6 % (ref 12.0–46.0)
Lymphs Abs: 1.8 10*3/uL (ref 0.7–4.0)
MCHC: 33.6 g/dL (ref 30.0–36.0)
MCV: 87.6 fl (ref 78.0–100.0)
Monocytes Absolute: 0.4 10*3/uL (ref 0.1–1.0)
Monocytes Relative: 7.1 % (ref 3.0–12.0)
NEUTROS ABS: 3.2 10*3/uL (ref 1.4–7.7)
Neutrophils Relative %: 57.2 % (ref 43.0–77.0)
Platelets: 256 10*3/uL (ref 150.0–400.0)
RBC: 4.61 Mil/uL (ref 3.87–5.11)
RDW: 13.8 % (ref 11.5–15.5)
WBC: 5.5 10*3/uL (ref 4.0–10.5)

## 2018-06-20 MED ORDER — ZOSTER VAC RECOMB ADJUVANTED 50 MCG/0.5ML IM SUSR: 0.5000 mL | Freq: Once | INTRAMUSCULAR | 1 refills | Status: AC

## 2018-06-20 NOTE — Progress Notes (Signed)
Pre visit review using our clinic review tool, if applicable. No additional management support is needed unless otherwise documented below in the visit note. 
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
## 2018-06-20 NOTE — Assessment & Plan Note (Signed)
-  Tdap: 2013;   prevnar: 2016;  Pneumonia shot 2010;  shingrex Rx printed. had a flu shot   -CCS:  Colonoscopy 07-2010, Dr. Randa EvensEdwards, had polyps;  states she would never do that again. See comments from 2017 ; iFOB neg 2017, 2018, provided one today  -Female care: Sees Dr Luna KitchensG Atkins ; [facility] 12-2017  Menopausal since 2016, report DEXA @ gyn  -Diet and exercise: Counseled + FH CAD:  intolerant to aspirin daily  due to gum bleeding  -Labs reviewed, due for a FLP CBC

## 2018-06-20 NOTE — Patient Instructions (Addendum)
Please schedule Medicare Wellness with Lawanna KobusAngel.   GO TO THE LAB : Get the blood work     GO TO THE FRONT DESK Schedule your next appointment for a checkup in 6 to 8 months  For diarrhea: Loperamide 2 or 4 mg before meals as needed. No more than 3 times a day

## 2018-06-21 NOTE — Assessment & Plan Note (Signed)
Labs from 11/19: Normal creatinine, potassium 5.0, LFTs normal.  A1c 14.  TSH 0.0 15 DM: Now under the care of endocrinology, she finally started insulin, stopped Actos/Januvia.  Although sugars are still in the high side she feels much improved. "I was in a fog" Thyroid disease: Per endocrinology. HTN: On amlodipine, Inderal, controlled. IBS: Chronic diarrhea, previously antispasmodics failed, does not see GI regularly, recommend loperamide 45 minutes before a meal as needed. DJD: On daily meloxicam.  No apparent side effects, encouraged to take with food. RTC 6 to 8 months

## 2018-06-24 ENCOUNTER — Other Ambulatory Visit: Payer: Self-pay | Admitting: Internal Medicine

## 2018-07-11 DIAGNOSIS — E0590 Thyrotoxicosis, unspecified without thyrotoxic crisis or storm: Secondary | ICD-10-CM | POA: Diagnosis not present

## 2018-07-11 DIAGNOSIS — E1165 Type 2 diabetes mellitus with hyperglycemia: Secondary | ICD-10-CM | POA: Diagnosis not present

## 2018-07-11 DIAGNOSIS — E11319 Type 2 diabetes mellitus with unspecified diabetic retinopathy without macular edema: Secondary | ICD-10-CM | POA: Diagnosis not present

## 2018-07-11 DIAGNOSIS — Z794 Long term (current) use of insulin: Secondary | ICD-10-CM | POA: Diagnosis not present

## 2018-07-11 DIAGNOSIS — E042 Nontoxic multinodular goiter: Secondary | ICD-10-CM | POA: Diagnosis not present

## 2018-08-15 ENCOUNTER — Ambulatory Visit: Payer: Medicare Other | Admitting: Neurology

## 2018-08-18 ENCOUNTER — Ambulatory Visit: Payer: Medicare Other | Admitting: Neurology

## 2018-08-24 DIAGNOSIS — E0590 Thyrotoxicosis, unspecified without thyrotoxic crisis or storm: Secondary | ICD-10-CM | POA: Diagnosis not present

## 2018-08-24 DIAGNOSIS — E11319 Type 2 diabetes mellitus with unspecified diabetic retinopathy without macular edema: Secondary | ICD-10-CM | POA: Diagnosis not present

## 2018-08-24 DIAGNOSIS — E1165 Type 2 diabetes mellitus with hyperglycemia: Secondary | ICD-10-CM | POA: Diagnosis not present

## 2018-08-24 DIAGNOSIS — E042 Nontoxic multinodular goiter: Secondary | ICD-10-CM | POA: Diagnosis not present

## 2018-08-24 DIAGNOSIS — Z794 Long term (current) use of insulin: Secondary | ICD-10-CM | POA: Diagnosis not present

## 2018-09-06 DIAGNOSIS — E1165 Type 2 diabetes mellitus with hyperglycemia: Secondary | ICD-10-CM | POA: Diagnosis not present

## 2018-09-06 DIAGNOSIS — E11319 Type 2 diabetes mellitus with unspecified diabetic retinopathy without macular edema: Secondary | ICD-10-CM | POA: Diagnosis not present

## 2018-09-06 DIAGNOSIS — Z794 Long term (current) use of insulin: Secondary | ICD-10-CM | POA: Diagnosis not present

## 2018-09-20 DIAGNOSIS — H353131 Nonexudative age-related macular degeneration, bilateral, early dry stage: Secondary | ICD-10-CM | POA: Diagnosis not present

## 2018-10-05 ENCOUNTER — Other Ambulatory Visit: Payer: Self-pay | Admitting: Internal Medicine

## 2018-10-08 ENCOUNTER — Other Ambulatory Visit: Payer: Self-pay | Admitting: Internal Medicine

## 2018-10-15 ENCOUNTER — Other Ambulatory Visit: Payer: Self-pay | Admitting: Internal Medicine

## 2018-10-20 DIAGNOSIS — E113393 Type 2 diabetes mellitus with moderate nonproliferative diabetic retinopathy without macular edema, bilateral: Secondary | ICD-10-CM | POA: Diagnosis not present

## 2018-11-20 ENCOUNTER — Other Ambulatory Visit: Payer: Self-pay | Admitting: Internal Medicine

## 2018-11-28 DIAGNOSIS — E11319 Type 2 diabetes mellitus with unspecified diabetic retinopathy without macular edema: Secondary | ICD-10-CM | POA: Diagnosis not present

## 2018-11-28 DIAGNOSIS — E042 Nontoxic multinodular goiter: Secondary | ICD-10-CM | POA: Diagnosis not present

## 2018-11-28 DIAGNOSIS — E0590 Thyrotoxicosis, unspecified without thyrotoxic crisis or storm: Secondary | ICD-10-CM | POA: Diagnosis not present

## 2018-11-28 DIAGNOSIS — E1165 Type 2 diabetes mellitus with hyperglycemia: Secondary | ICD-10-CM | POA: Diagnosis not present

## 2018-11-28 DIAGNOSIS — Z794 Long term (current) use of insulin: Secondary | ICD-10-CM | POA: Diagnosis not present

## 2018-12-24 ENCOUNTER — Encounter: Payer: Self-pay | Admitting: Internal Medicine

## 2019-01-01 DIAGNOSIS — L304 Erythema intertrigo: Secondary | ICD-10-CM | POA: Diagnosis not present

## 2019-01-01 DIAGNOSIS — B372 Candidiasis of skin and nail: Secondary | ICD-10-CM | POA: Diagnosis not present

## 2019-01-01 DIAGNOSIS — L03314 Cellulitis of groin: Secondary | ICD-10-CM | POA: Diagnosis not present

## 2019-01-01 DIAGNOSIS — E1169 Type 2 diabetes mellitus with other specified complication: Secondary | ICD-10-CM | POA: Diagnosis not present

## 2019-01-01 DIAGNOSIS — Z794 Long term (current) use of insulin: Secondary | ICD-10-CM | POA: Diagnosis not present

## 2019-01-05 DIAGNOSIS — N760 Acute vaginitis: Secondary | ICD-10-CM | POA: Diagnosis not present

## 2019-01-31 ENCOUNTER — Other Ambulatory Visit: Payer: Self-pay

## 2019-01-31 ENCOUNTER — Ambulatory Visit (INDEPENDENT_AMBULATORY_CARE_PROVIDER_SITE_OTHER): Payer: Medicare Other | Admitting: Internal Medicine

## 2019-01-31 ENCOUNTER — Encounter: Payer: Self-pay | Admitting: Internal Medicine

## 2019-01-31 VITALS — BP 124/92 | HR 96 | Temp 97.9°F | Resp 16 | Ht 60.0 in | Wt 157.5 lb

## 2019-01-31 DIAGNOSIS — I10 Essential (primary) hypertension: Secondary | ICD-10-CM

## 2019-01-31 DIAGNOSIS — K580 Irritable bowel syndrome with diarrhea: Secondary | ICD-10-CM | POA: Diagnosis not present

## 2019-01-31 DIAGNOSIS — F3174 Bipolar disorder, in full remission, most recent episode manic: Secondary | ICD-10-CM

## 2019-01-31 DIAGNOSIS — E0590 Thyrotoxicosis, unspecified without thyrotoxic crisis or storm: Secondary | ICD-10-CM

## 2019-01-31 LAB — CBC WITH DIFFERENTIAL/PLATELET
Basophils Absolute: 0 10*3/uL (ref 0.0–0.1)
Basophils Relative: 0.7 % (ref 0.0–3.0)
Eosinophils Absolute: 0.2 10*3/uL (ref 0.0–0.7)
Eosinophils Relative: 2.3 % (ref 0.0–5.0)
HCT: 41.4 % (ref 36.0–46.0)
Hemoglobin: 13.6 g/dL (ref 12.0–15.0)
Lymphocytes Relative: 31.6 % (ref 12.0–46.0)
Lymphs Abs: 2.1 10*3/uL (ref 0.7–4.0)
MCHC: 32.9 g/dL (ref 30.0–36.0)
MCV: 91.3 fl (ref 78.0–100.0)
Monocytes Absolute: 0.5 10*3/uL (ref 0.1–1.0)
Monocytes Relative: 8 % (ref 3.0–12.0)
Neutro Abs: 3.9 10*3/uL (ref 1.4–7.7)
Neutrophils Relative %: 57.4 % (ref 43.0–77.0)
Platelets: 249 10*3/uL (ref 150.0–400.0)
RBC: 4.54 Mil/uL (ref 3.87–5.11)
RDW: 14 % (ref 11.5–15.5)
WBC: 6.7 10*3/uL (ref 4.0–10.5)

## 2019-01-31 LAB — TSH: TSH: 1.34 u[IU]/mL (ref 0.35–4.50)

## 2019-01-31 LAB — COMPREHENSIVE METABOLIC PANEL
ALT: 38 U/L — ABNORMAL HIGH (ref 0–35)
AST: 25 U/L (ref 0–37)
Albumin: 3.6 g/dL (ref 3.5–5.2)
Alkaline Phosphatase: 113 U/L (ref 39–117)
BUN: 24 mg/dL — ABNORMAL HIGH (ref 6–23)
CO2: 33 mEq/L — ABNORMAL HIGH (ref 19–32)
Calcium: 8.6 mg/dL (ref 8.4–10.5)
Chloride: 93 mEq/L — ABNORMAL LOW (ref 96–112)
Creatinine, Ser: 0.72 mg/dL (ref 0.40–1.20)
GFR: 82.94 mL/min (ref >60.00)
Glucose, Bld: 422 mg/dL — ABNORMAL HIGH (ref 70–99)
Potassium: 4.4 mEq/L (ref 3.5–5.1)
Sodium: 133 mEq/L — ABNORMAL LOW (ref 135–145)
Total Bilirubin: 0.3 mg/dL (ref 0.2–1.2)
Total Protein: 7 g/dL (ref 6.0–8.3)

## 2019-01-31 LAB — T4, FREE: Free T4: 0.86 ng/dL (ref 0.60–1.60)

## 2019-01-31 LAB — T3, FREE: T3, Free: 3.5 pg/mL (ref 2.3–4.2)

## 2019-01-31 NOTE — Patient Instructions (Signed)
GO TO THE LAB : Get the blood work, get a stool container and bring a fresh sample soon as possible   GO TO THE FRONT DESK Schedule your next appointment   for a follow-up in 3 months  We are referring back you back to Dr. Oletta Lamas, gastroenterology

## 2019-01-31 NOTE — Progress Notes (Signed)
Pre visit review using our clinic review tool, if applicable. No additional management support is needed unless otherwise documented below in the visit note. 
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
## 2019-02-01 ENCOUNTER — Other Ambulatory Visit: Payer: Medicare Other

## 2019-02-01 ENCOUNTER — Telehealth: Payer: Self-pay

## 2019-02-01 DIAGNOSIS — K580 Irritable bowel syndrome with diarrhea: Secondary | ICD-10-CM

## 2019-02-01 DIAGNOSIS — R197 Diarrhea, unspecified: Secondary | ICD-10-CM | POA: Diagnosis not present

## 2019-02-01 NOTE — Telephone Encounter (Signed)
Normally 1 bowel movement is enough for both test(cups) they do not need to be full. If they are collected separate then each one should be returned the same day or the next day no later than 4pm.

## 2019-02-01 NOTE — Telephone Encounter (Signed)
Adrienne Bennett and Adrienne Bennett- can you help Pt?

## 2019-02-01 NOTE — Telephone Encounter (Signed)
Pt dropped off samples today.

## 2019-02-01 NOTE — Telephone Encounter (Signed)
Copied from [place] [id number]. Topic: General - Other >> Feb 01, 2019  9:25 AM Ivar Drape wrote: Reason for CRM:   Patient had a virtual visit with the provider and was told to do two bowel samples.  She wanted to know should she bring it to the office after each bowel movement or do she wait until she has both bowel movements and keep them cold.  Please advise.

## 2019-02-02 LAB — GASTROINTESTINAL PATHOGEN PANEL PCR
C. difficile Tox A/B, PCR: NOT DETECTED
Campylobacter, PCR: NOT DETECTED
Cryptosporidium, PCR: NOT DETECTED
E coli (ETEC) LT/ST PCR: NOT DETECTED
E coli (STEC) stx1/stx2, PCR: NOT DETECTED
E coli 0157, PCR: NOT DETECTED
Giardia lamblia, PCR: NOT DETECTED
Norovirus, PCR: NOT DETECTED
Rotavirus A, PCR: NOT DETECTED
Salmonella, PCR: NOT DETECTED
Shigella, PCR: NOT DETECTED

## 2019-02-02 NOTE — Assessment & Plan Note (Signed)
DM: Under the care of Endo, on insulin and metformin, still not well controlled Thyroid disease: Reports a thyroid ablation about a year ago, will check TFTs HTN: Seems well controlled, continue with amlodipine, Inderal. Depression, bipolar: Still see psychiatry however her therapist retired and will see a new one in few weeks (slightly nervous about that).  Also lost a sister not long ago, she is a slightly more stress than before. IBS: Diarrhea has definitely increased from baseline for the last 2 months, I asked her if she felt it was due to stress and she did not think so.  Took antibiotics last week for a skin infection but otherwise no antibiotics. Etiology of increased diarrhea : IBS versus metformin (although she has been on it for years) versus bacterial overgrowth versus an infection. Plan: Stool cultures, WBCs, GI referral.  RTC 3 months

## 2019-02-03 LAB — FECAL LACTOFERRIN, QUANT
Fecal Lactoferrin: NEGATIVE
MICRO NUMBER:: 645913
SPECIMEN QUALITY:: ADEQUATE

## 2019-02-14 ENCOUNTER — Encounter: Payer: Self-pay | Admitting: Internal Medicine

## 2019-02-15 ENCOUNTER — Telehealth: Payer: Self-pay | Admitting: Internal Medicine

## 2019-02-15 DIAGNOSIS — R197 Diarrhea, unspecified: Secondary | ICD-10-CM | POA: Diagnosis not present

## 2019-02-15 DIAGNOSIS — K588 Other irritable bowel syndrome: Secondary | ICD-10-CM | POA: Diagnosis not present

## 2019-02-15 NOTE — Telephone Encounter (Addendum)
Tried calling Pt- no answer, unable to leave message. Mychart message sent to Pt to call office.

## 2019-02-15 NOTE — Telephone Encounter (Signed)
See patient's message, she is complaining of severe arthritis pain.  That needs to be investigated. Please  schedule a in person visit if she is not having any fever, chills, cough, unusual GI symptoms. If she is having fever please let me know She is also requesting a parking permit, that is okay (permanent)

## 2019-02-17 ENCOUNTER — Other Ambulatory Visit: Payer: Self-pay | Admitting: Gastroenterology

## 2019-03-01 ENCOUNTER — Other Ambulatory Visit: Payer: Self-pay | Admitting: Internal Medicine

## 2019-03-05 ENCOUNTER — Other Ambulatory Visit: Payer: Self-pay | Admitting: Internal Medicine

## 2019-03-10 DIAGNOSIS — N760 Acute vaginitis: Secondary | ICD-10-CM | POA: Diagnosis not present

## 2019-03-21 ENCOUNTER — Encounter: Payer: Self-pay | Admitting: Internal Medicine

## 2019-04-10 ENCOUNTER — Ambulatory Visit (HOSPITAL_COMMUNITY): Admission: RE | Admit: 2019-04-10 | Payer: Medicare Other | Source: Home / Self Care | Admitting: Gastroenterology

## 2019-04-10 ENCOUNTER — Encounter (HOSPITAL_COMMUNITY): Admission: RE | Payer: Self-pay | Source: Home / Self Care

## 2019-04-10 SURGERY — COLONOSCOPY WITH PROPOFOL
Anesthesia: Monitor Anesthesia Care

## 2019-04-17 ENCOUNTER — Encounter: Payer: Self-pay | Admitting: Internal Medicine

## 2019-04-17 ENCOUNTER — Other Ambulatory Visit: Payer: Self-pay

## 2019-04-17 DIAGNOSIS — E11319 Type 2 diabetes mellitus with unspecified diabetic retinopathy without macular edema: Secondary | ICD-10-CM | POA: Diagnosis not present

## 2019-04-17 DIAGNOSIS — Z794 Long term (current) use of insulin: Secondary | ICD-10-CM | POA: Diagnosis not present

## 2019-04-17 DIAGNOSIS — E1165 Type 2 diabetes mellitus with hyperglycemia: Secondary | ICD-10-CM | POA: Diagnosis not present

## 2019-04-17 DIAGNOSIS — Z23 Encounter for immunization: Secondary | ICD-10-CM | POA: Diagnosis not present

## 2019-04-17 DIAGNOSIS — E0590 Thyrotoxicosis, unspecified without thyrotoxic crisis or storm: Secondary | ICD-10-CM | POA: Diagnosis not present

## 2019-04-17 DIAGNOSIS — E042 Nontoxic multinodular goiter: Secondary | ICD-10-CM | POA: Diagnosis not present

## 2019-04-17 LAB — HEMOGLOBIN A1C: Hemoglobin A1C: 13.9

## 2019-04-18 ENCOUNTER — Ambulatory Visit (INDEPENDENT_AMBULATORY_CARE_PROVIDER_SITE_OTHER): Payer: Medicare Other | Admitting: Internal Medicine

## 2019-04-18 ENCOUNTER — Other Ambulatory Visit: Payer: Self-pay

## 2019-04-18 ENCOUNTER — Encounter: Payer: Self-pay | Admitting: Internal Medicine

## 2019-04-18 ENCOUNTER — Ambulatory Visit (HOSPITAL_BASED_OUTPATIENT_CLINIC_OR_DEPARTMENT_OTHER)
Admission: RE | Admit: 2019-04-18 | Discharge: 2019-04-18 | Disposition: A | Discharge status: Home / Self Care | Payer: Medicare Other | Source: Ambulatory Visit | Attending: Internal Medicine | Admitting: Internal Medicine

## 2019-04-18 VITALS — BP 117/64 | HR 88 | Temp 97.6°F | Ht 60.0 in | Wt 156.0 lb

## 2019-04-18 DIAGNOSIS — M25561 Pain in right knee: Secondary | ICD-10-CM

## 2019-04-18 NOTE — Assessment & Plan Note (Signed)
Right knee pain: After injury 4 weeks ago, pain is quite atypical, she has episodic pain, not by walking but randomly. Exam is benign. Plan: X-ray, sports medical referral. Burtis Junes the use of ice and Tylenol over NSAIDs.
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
## 2019-04-18 NOTE — Patient Instructions (Signed)
   STOP BY THE FIRST FLOOR:  get the XR   For pain: Ice , tylenol as needed

## 2019-05-01 ENCOUNTER — Other Ambulatory Visit: Payer: Self-pay | Admitting: Internal Medicine

## 2019-05-03 ENCOUNTER — Ambulatory Visit (INDEPENDENT_AMBULATORY_CARE_PROVIDER_SITE_OTHER): Payer: Medicare Other | Admitting: Internal Medicine

## 2019-05-03 ENCOUNTER — Encounter: Payer: Self-pay | Admitting: Internal Medicine

## 2019-05-03 ENCOUNTER — Other Ambulatory Visit: Payer: Self-pay

## 2019-05-03 VITALS — BP 107/68 | HR 90 | Temp 97.4°F | Resp 16 | Ht 60.0 in | Wt 155.4 lb

## 2019-05-03 DIAGNOSIS — R197 Diarrhea, unspecified: Secondary | ICD-10-CM

## 2019-05-03 DIAGNOSIS — E785 Hyperlipidemia, unspecified: Secondary | ICD-10-CM

## 2019-05-03 LAB — LIPID PANEL
Cholesterol: 156 mg/dL (ref 0–200)
HDL: 32.2 mg/dL — ABNORMAL LOW (ref >39.00)
LDL Cholesterol: 92 mg/dL (ref 0–99)
NonHDL: 123.71
Total CHOL/HDL Ratio: 5
Triglycerides: 160 mg/dL — ABNORMAL HIGH (ref 0.0–149.0)
VLDL: 32 mg/dL (ref 0.0–40.0)

## 2019-05-03 NOTE — Patient Instructions (Addendum)
Please schedule Medicare Wellness with Glenard Haring.   Per our records you are due for an eye exam. Please contact your eye doctor to schedule an appointment. Please have them send copies of your office visit notes to [place]. Our fax number is [phone].   GO TO THE LAB : Get the blood work     GO TO THE FRONT DESK Schedule your next appointment   for a physical exam in 6 months  Continue checking your blood pressures at home or at the pharmacy  BP GOAL is between 110/65 and  135/85. If it is consistently higher or lower, let me know
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
## 2019-05-03 NOTE — Progress Notes (Signed)
Pre visit review using our clinic review tool, if applicable. No additional management support is needed unless otherwise documented below in the visit note. 

## 2019-05-04 NOTE — Assessment & Plan Note (Signed)
Diarrhea: Saw GI 02/15/2019, Rx a colonoscopy, patient canceled it because she did not like to go to the hospital due to COVID-19 fear.  I explained the patient that her chances to get COVID-19 of the hospital are very low due to the excellent precautions they take.  Encouraged her to rethink her decision and contact GI if she changes her mind. HTN: BP today is very good, continue amlodipine, Inderal. Knee pain: See last visit, self resolving, decided not to see sports medicine. High cholesterol: On atorvastatin, last LFTs normal, check a FLP Depression: Follow-up by psychiatry, saw a new therapist, it was a very positive encounter.  She is also in much better terms with her sister, they are taking & taking walks together.  She seems to be extremely happy about it. Preventive care: Had a flu shot already RTC CPX 6 months.

## 2019-05-22 DIAGNOSIS — E042 Nontoxic multinodular goiter: Secondary | ICD-10-CM | POA: Diagnosis not present

## 2019-05-22 DIAGNOSIS — E11319 Type 2 diabetes mellitus with unspecified diabetic retinopathy without macular edema: Secondary | ICD-10-CM | POA: Diagnosis not present

## 2019-05-22 DIAGNOSIS — E1165 Type 2 diabetes mellitus with hyperglycemia: Secondary | ICD-10-CM | POA: Diagnosis not present

## 2019-05-22 DIAGNOSIS — Z794 Long term (current) use of insulin: Secondary | ICD-10-CM | POA: Diagnosis not present

## 2019-05-28 ENCOUNTER — Encounter: Payer: Self-pay | Admitting: Internal Medicine

## 2019-05-29 ENCOUNTER — Other Ambulatory Visit: Payer: Self-pay

## 2019-05-29 ENCOUNTER — Encounter: Payer: Self-pay | Admitting: Internal Medicine

## 2019-05-29 ENCOUNTER — Ambulatory Visit (INDEPENDENT_AMBULATORY_CARE_PROVIDER_SITE_OTHER): Payer: Medicare Other | Admitting: Internal Medicine

## 2019-05-29 VITALS — BP 147/83 | HR 103 | Temp 97.6°F | Resp 18 | Ht 60.0 in | Wt 156.5 lb

## 2019-05-29 DIAGNOSIS — E11319 Type 2 diabetes mellitus with unspecified diabetic retinopathy without macular edema: Secondary | ICD-10-CM | POA: Diagnosis not present

## 2019-05-29 DIAGNOSIS — R609 Edema, unspecified: Secondary | ICD-10-CM

## 2019-05-29 DIAGNOSIS — R600 Localized edema: Secondary | ICD-10-CM

## 2019-05-29 DIAGNOSIS — E1165 Type 2 diabetes mellitus with hyperglycemia: Secondary | ICD-10-CM | POA: Diagnosis not present

## 2019-05-29 DIAGNOSIS — Z794 Long term (current) use of insulin: Secondary | ICD-10-CM | POA: Diagnosis not present

## 2019-05-29 NOTE — Patient Instructions (Signed)
GO TO THE LAB : Get the blood work     GO TO THE FRONT DESK Schedule your next appointment   for a checkup in 1 month   Low-salt diet  Leg elevation  Take as little meloxicam as you can
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
## 2019-05-29 NOTE — Telephone Encounter (Signed)
Called patient twice. 1st time I spoke with the husband he stated she wasn't home and to call back in an hour or 2. I called back again 2nd time there was no answer so I left a msg for patient to call us back for an appt.

## 2019-05-29 NOTE — Progress Notes (Signed)
Pre visit review using our clinic review tool, if applicable. No additional management support is needed unless otherwise documented below in the visit note. 

## 2019-05-30 LAB — COMPREHENSIVE METABOLIC PANEL
ALT: 25 U/L (ref 0–35)
AST: 28 U/L (ref 0–37)
Albumin: 3.8 g/dL (ref 3.5–5.2)
Alkaline Phosphatase: 82 U/L (ref 39–117)
BUN: 23 mg/dL (ref 6–23)
CO2: 36 mEq/L — ABNORMAL HIGH (ref 19–32)
Calcium: 9 mg/dL (ref 8.4–10.5)
Chloride: 97 mEq/L (ref 96–112)
Creatinine, Ser: 0.65 mg/dL (ref 0.40–1.20)
GFR: 93.23 mL/min (ref >60.00)
Glucose, Bld: 119 mg/dL — ABNORMAL HIGH (ref 70–99)
Potassium: 4.4 mEq/L (ref 3.5–5.1)
Sodium: 140 mEq/L (ref 135–145)
Total Bilirubin: 0.3 mg/dL (ref 0.2–1.2)
Total Protein: 7 g/dL (ref 6.0–8.3)

## 2019-05-30 LAB — TSH: TSH: 1.07 u[IU]/mL (ref 0.35–4.50)

## 2019-05-30 NOTE — Assessment & Plan Note (Signed)
Lower extremity edema: As described above EKG today: Sinus tachycardia, no acute changes No CHF on clinical grounds, related to amlodipine or NSAIDs?  She has been on amlodipine for a while.. Plan: Check a CMP, TSH. Leg elevation, low-salt diet. If problem persists, consider further eval including echo, hold amlodipine. OSA?  She falls asleep easily, she is at risk, will formally reassess when she comes back RTC 1 month

## 2019-06-10 ENCOUNTER — Encounter: Payer: Self-pay | Admitting: Internal Medicine

## 2019-06-14 DIAGNOSIS — Z0279 Encounter for issue of other medical certificate: Secondary | ICD-10-CM

## 2019-06-19 ENCOUNTER — Telehealth: Payer: Self-pay | Admitting: Neurology

## 2019-06-19 NOTE — Telephone Encounter (Signed)
Patient wants to check the status of her DMV paperwork she needs to get them to another Dr as well and get them back to the [facility] by 07-14-19. Please call

## 2019-06-19 NOTE — Telephone Encounter (Signed)
I have them but I asked Brandy to schedule her for an appointment with me on 11/30 because I have not seen her since I filled out the forms a year ago. Needs an updated visit. If she needs the forms for the other docs before her visit with me on 11/30, she can get the other pages this week.

## 2019-06-19 NOTE — Telephone Encounter (Signed)
Do you have the DMV forms on your desk?

## 2019-06-20 NOTE — Telephone Encounter (Signed)
I had her scheduled for 06/26/19 but she called back yesterday and rescheduled to 07/11/19. I believe she had a schedule conflict. Thanks

## 2019-06-20 NOTE — Telephone Encounter (Signed)
Adrienne Bennett,  Is there an opening with Dr. Delice Lesch on the 30th? If so put the pt in there and I will call her. Thank you.

## 2019-06-26 ENCOUNTER — Encounter: Payer: Self-pay | Admitting: Internal Medicine

## 2019-06-26 ENCOUNTER — Other Ambulatory Visit: Payer: Self-pay

## 2019-06-26 ENCOUNTER — Telehealth: Payer: Medicare Other | Admitting: Neurology

## 2019-06-26 ENCOUNTER — Ambulatory Visit (INDEPENDENT_AMBULATORY_CARE_PROVIDER_SITE_OTHER): Payer: Medicare Other | Admitting: Internal Medicine

## 2019-06-26 VITALS — BP 174/78 | HR 86 | Temp 97.9°F | Resp 18 | Ht 60.0 in | Wt 156.5 lb

## 2019-06-26 DIAGNOSIS — R600 Localized edema: Secondary | ICD-10-CM | POA: Diagnosis not present

## 2019-06-26 DIAGNOSIS — I10 Essential (primary) hypertension: Secondary | ICD-10-CM

## 2019-06-26 DIAGNOSIS — E785 Hyperlipidemia, unspecified: Secondary | ICD-10-CM | POA: Diagnosis not present

## 2019-06-26 DIAGNOSIS — M8949 Other hypertrophic osteoarthropathy, multiple sites: Secondary | ICD-10-CM

## 2019-06-26 DIAGNOSIS — M159 Polyosteoarthritis, unspecified: Secondary | ICD-10-CM

## 2019-06-26 MED ORDER — PROPRANOLOL HCL 20 MG PO TABS: 20.0000 mg | ORAL_TABLET | Freq: Three times a day (TID) | ORAL | 1 refills | Status: DC

## 2019-06-26 NOTE — Patient Instructions (Addendum)
Please schedule Medicare Wellness with Glenard Haring.   Per our records you are due for an eye exam. Please contact your eye doctor to schedule an appointment. Please have them send copies of your office visit notes to [place]. Our fax number is [phone].    GO TO THE FRONT DESK Schedule your next appointment for a physical exam by 10/2019  Increase inderal to 20 mg : twice a day       Check the  blood pressure   weekly   BP GOAL is between 110/65 and  135/85. If it is consistently higher or lower, let me know   Back Exercises These exercises help to make your trunk and back strong. They also help to keep the lower back flexible. Doing these exercises can help to prevent back pain or lessen existing pain.  If you have back pain, try to do these exercises 2-3 times each day or as told by your doctor.  As you get better, do the exercises once each day. Repeat the exercises more often as told by your doctor.  To stop back pain from coming back, do the exercises once each day, or as told by your doctor. Exercises Single knee to chest Do these steps 3-5 times in a row for each leg: 1. Lie on your back on a firm bed or the floor with your legs stretched out. 2. Bring one knee to your chest. 3. Grab your knee or thigh with both hands and hold them it in place. 4. Pull on your knee until you feel a gentle stretch in your lower back or buttocks. 5. Keep doing the stretch for 10-30 seconds. 6. Slowly let go of your leg and straighten it. Pelvic tilt Do these steps 5-10 times in a row: 1. Lie on your back on a firm bed or the floor with your legs stretched out. 2. Bend your knees so they point up to the ceiling. Your feet should be flat on the floor. 3. Tighten your lower belly (abdomen) muscles to press your lower back against the floor. This will make your tailbone point up to the ceiling instead of pointing down to your feet or the floor. 4. Stay in this position for 5-10 seconds while you  gently tighten your muscles and breathe evenly. Cat-cow Do these steps until your lower back bends more easily: 1. Get on your hands and knees on a firm surface. Keep your hands under your shoulders, and keep your knees under your hips. You may put padding under your knees. 2. Let your head hang down toward your chest. Tighten (contract) the muscles in your belly. Point your tailbone toward the floor so your lower back becomes rounded like the back of a cat. 3. Stay in this position for 5 seconds. 4. Slowly lift your head. Let the muscles of your belly relax. Point your tailbone up toward the ceiling so your back forms a sagging arch like the back of a cow. 5. Stay in this position for 5 seconds.  Press-ups Do these steps 5-10 times in a row: 1. Lie on your belly (face-down) on the floor. 2. Place your hands near your head, about shoulder-width apart. 3. While you keep your back relaxed and keep your hips on the floor, slowly straighten your arms to raise the top half of your body and lift your shoulders. Do not use your back muscles. You may change where you place your hands in order to make yourself more comfortable. 4. Stay  in this position for 5 seconds. 5. Slowly return to lying flat on the floor.  Bridges Do these steps 10 times in a row: 1. Lie on your back on a firm surface. 2. Bend your knees so they point up to the ceiling. Your feet should be flat on the floor. Your arms should be flat at your sides, next to your body. 3. Tighten your butt muscles and lift your butt off the floor until your waist is almost as high as your knees. If you do not feel the muscles working in your butt and the back of your thighs, slide your feet 1-2 inches farther away from your butt. 4. Stay in this position for 3-5 seconds. 5. Slowly lower your butt to the floor, and let your butt muscles relax. If this exercise is too easy, try doing it with your arms crossed over your chest. Belly crunches Do  these steps 5-10 times in a row: 1. Lie on your back on a firm bed or the floor with your legs stretched out. 2. Bend your knees so they point up to the ceiling. Your feet should be flat on the floor. 3. Cross your arms over your chest. 4. Tip your chin a little bit toward your chest but do not bend your neck. 5. Tighten your belly muscles and slowly raise your chest just enough to lift your shoulder blades a tiny bit off of the floor. Avoid raising your body higher than that, because it can put too much stress on your low back. 6. Slowly lower your chest and your head to the floor. Back lifts Do these steps 5-10 times in a row: 1. Lie on your belly (face-down) with your arms at your sides, and rest your forehead on the floor. 2. Tighten the muscles in your legs and your butt. 3. Slowly lift your chest off of the floor while you keep your hips on the floor. Keep the back of your head in line with the curve in your back. Look at the floor while you do this. 4. Stay in this position for 3-5 seconds. 5. Slowly lower your chest and your face to the floor. Contact a doctor if:  Your back pain gets a lot worse when you do an exercise.  Your back pain does not get better 2 hours after you exercise. If you have any of these problems, stop doing the exercises. Do not do them again unless your doctor says it is okay. Get help right away if:  You have sudden, very bad back pain. If this happens, stop doing the exercises. Do not do them again unless your doctor says it is okay. This information is not intended to replace advice given to you by your health care provider. Make sure you discuss any questions you have with your health care provider. Document Released: 08/15/2010 Document Revised: 04/07/2018 Document Reviewed: 04/07/2018 Elsevier Patient Education  2020 [employer].
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
## 2019-06-26 NOTE — Progress Notes (Signed)
Pre visit review using our clinic review tool, if applicable. No additional management support is needed unless otherwise documented below in the visit note. 

## 2019-06-26 NOTE — Telephone Encounter (Signed)
Left message for patient advising papers cannot be filled out until she is seen.

## 2019-06-27 NOTE — Assessment & Plan Note (Signed)
DM, thyroid disease: Per endocrine, last TSH few weeks ago was 2.4. L E edema: Much improved after she is started to do leg elevation HTN: Currently on amlodipine and Inderal, BP the last couple of visits has been slightly elevated, recommend to increase Inderal to 20 mg twice daily and monitor BPs, see AVS. High cholesterol: On atorvastatin, LFTs were elevated when he saw endocrinology in October however LFTs normal here in November.  No change DJD: Having back pain on and off, currently taking meloxicam and Flexeril without apparent side effects.  We talk about possibly referral to Ortho versus PT versus self stretching.  Elected self stretching, exercises discussed. RTC 10-2019 physical exam

## 2019-06-28 ENCOUNTER — Telehealth: Payer: Self-pay | Admitting: Neurology

## 2019-06-28 DIAGNOSIS — E042 Nontoxic multinodular goiter: Secondary | ICD-10-CM | POA: Diagnosis not present

## 2019-06-28 DIAGNOSIS — E11319 Type 2 diabetes mellitus with unspecified diabetic retinopathy without macular edema: Secondary | ICD-10-CM | POA: Diagnosis not present

## 2019-06-28 DIAGNOSIS — Z794 Long term (current) use of insulin: Secondary | ICD-10-CM | POA: Diagnosis not present

## 2019-06-28 DIAGNOSIS — E1165 Type 2 diabetes mellitus with hyperglycemia: Secondary | ICD-10-CM | POA: Diagnosis not present

## 2019-06-28 DIAGNOSIS — E0590 Thyrotoxicosis, unspecified without thyrotoxic crisis or storm: Secondary | ICD-10-CM | POA: Diagnosis not present

## 2019-06-28 NOTE — Telephone Encounter (Signed)
Left message giving patient customer no [phone] also sent information via mychart.

## 2019-06-28 NOTE — Telephone Encounter (Signed)
Patient called in needing a number off the DMV paper work to ask for an extension. Please call. Thank you

## 2019-06-29 DIAGNOSIS — E113293 Type 2 diabetes mellitus with mild nonproliferative diabetic retinopathy without macular edema, bilateral: Secondary | ICD-10-CM | POA: Diagnosis not present

## 2019-07-11 ENCOUNTER — Telehealth (INDEPENDENT_AMBULATORY_CARE_PROVIDER_SITE_OTHER): Payer: Medicare Other | Admitting: Neurology

## 2019-07-11 ENCOUNTER — Other Ambulatory Visit: Payer: Self-pay

## 2019-07-11 ENCOUNTER — Encounter

## 2019-07-11 ENCOUNTER — Encounter: Payer: Self-pay | Admitting: Neurology

## 2019-07-11 VITALS — Ht 60.0 in | Wt 156.0 lb

## 2019-07-11 DIAGNOSIS — R569 Unspecified convulsions: Secondary | ICD-10-CM

## 2019-07-11 NOTE — Telephone Encounter (Signed)
Dr. Amparo Bristol portion of DMV forms have been completed. Per Delice Lesch pt's endocrinologist needs to complete a section as well. Pt notified on vmail. Left message informing pt of this and that she could come by the office to pick up originals. Originals placed up front.

## 2019-07-11 NOTE — Progress Notes (Signed)
Virtual Visit via Telephone Note The purpose of this virtual visit is to provide medical care while limiting exposure to the novel coronavirus.    Consent was obtained for phone visit:  Yes.   Answered questions that patient had about telehealth interaction:  Yes.   I discussed the limitations, risks, security and privacy concerns of performing an evaluation and management service by telephone. I also discussed with the patient that there may be a patient responsible charge related to this service. The patient expressed understanding and agreed to proceed.  Pt location: Home Physician Location: office Name of referring provider:  [name], MD I connected with .Adrienne Bennett at patients initiation/request on 07/11/2019 at  3:30 PM EST by telephone and verified that I am speaking with the correct person using two identifiers.  Pt MRN:  [id number] Pt DOB:  04/12/1961   History of Present Illness:  The patient had a telephone visit on 07/11/2019. She was last seen in the neurology clinic in June 2019 after she had 3 seizures in one day in February 2019 due to benzodiapezine withdrawal. She had been unable to take her medications administered by her husband since he was hospitalized himself at that time. Since her last visit, she denies any seizures since 08/2017. She has been switched from Xanax to Clonazepam and has been doing well. She is on Depakote for mood stabilization. She denies any staring/unresponsive episodes, gaps in time, olfactory/gustatory hallucinations, focal numbness/tingling/weakness, myoclonic jerks. No headaches, dizziness, falls. She is happy to report that her insulin dose has been lowered after she got her HbA1c down from 13.5 to 10.8.   History on Initial Assessment 01/04/2018: This is a pleasant 58 year old right-handed woman with a history of hypertension, diabetes, bipolar disorder, presenting for hospital follow-up after she had 3 seizures in one day last 09/23/2017.  Her husband had been admitted for surgery in the hospital and she states she had stayed up 3 nights in a row. Her husband manages her medications, she had not been taking Xanax, Depakote, and Wellbutrin for 3-4 days. She recalls going to [employer] on a foggy night, then her next recollection was feeling like she was in a coffin and it was frozen, then she could hear a woman yelling, then the next thing the woman hollered "she's seizing." She thought she was in a busy [employer] with a lot of people, but then realized she was in the ICU. Per ER notes, she was witnessed to become stiff with eyes rolled back for 2-3 minutes, she had another seizure while in CT.  Her head CT without contrast did not show any acute changes, there was mild diffuse atrophy and age-advanced white matter changes, a posterior fossa arachnoid cyst was also noted. Her wake and drowsy EEG were normal. She was evaluated by Neurology and diagnosed with benzodiazepine withdrawal seizures. All her medications were resumed.  She saw her psychiatrist Dr. Donell Beers, they report that her Xanax has been switched to clonazepam 1mg  1 in AM, 1/2 in PM. She has been taking Depakote for bipolar disorder for over 10 years and continues taking it. Wellbutrin was discontinued. She denies any prior history of seizures, no family history of seizures. She had a normal birth and early development.  There is no history of febrile convulsions, CNS infections such as meningitis/encephalitis, significant traumatic brain injury, neurosurgical procedures. She denies any olfactory/gustatory hallucinations, deja vu, rising epigastric sensation, focal numbness/tingling/weakness, myoclonic jerks. She denies any headaches, dizziness, diplopia, dysarthria/dysphagia,  neck/back pain, focal numbness/tingling/weakness, no falls. Her main concern today is insomnia, she states she had been taking Temazepam for the past month and was told she can take 1-2 tablets at night, but ran out  when she started taking 2 tablets qhs. Her husband continues to manage her medications. She has not been driving.    Outpatient Encounter Medications as of 07/11/2019  Medication Sig  . amLODipine (NORVASC) 10 MG tablet Take 1 tablet (10 mg total) by mouth daily.  Marland Kitchen atorvastatin (LIPITOR) 20 MG tablet Take 1 tablet (20 mg total) by mouth daily.  . clonazePAM (KLONOPIN) 1 MG tablet Take by mouth. 1 tab AM, 1/2 tab PM, rx by psychiatry  . cyclobenzaprine (FLEXERIL) 10 MG tablet Take 1 tablet (10 mg total) by mouth 2 (two) times daily as needed for muscle spasms.  . divalproex (DEPAKOTE) 250 MG DR tablet Take 250 mg by mouth. 1 tab AM, 2 tabs PM  . Insulin Degludec 200 UNIT/ML SOPN Inject 70 Units into the skin at bedtime.   . Lancets (ONETOUCH ULTRASOFT) lancets Check blood sugar no more than twice daily.  . meloxicam (MOBIC) 7.5 MG tablet Take 1 tablet (7.5 mg total) by mouth daily as needed for pain.  . metFORMIN (GLUCOPHAGE-XR) 500 MG 24 hr tablet Take 1 tablet (500 mg total) by mouth 2 (two) times daily.  [name] USE   TO CHECK GLUCOSE TWICE DAILY  . ONETOUCH VERIO test strip USE  STRIP TO CHECK GLUCOSE NO MORE THAN  TWICE DAILY  . oxybutynin (DITROPAN-XL) 5 MG 24 hr tablet Take 5 mg by mouth 2 (two) times daily.   . propranolol (INDERAL) 20 MG tablet Take 1 tablet (20 mg total) by mouth 3 (three) times daily.  . temazepam (RESTORIL) 15 MG capsule   . [DISCONTINUED] aspirin EC 81 MG tablet Take 81 mg by mouth daily.   No facility-administered encounter medications on file as of 07/11/2019.      Observations/Objective:  Limited due to nature of phone visit. Patient is awake, alert, able to answer questions without dysarthria or confusion.   Assessment and Plan:   This is a pleasant 58 yo RH woman with a history of hypertension, diabetes, bipolar disorder, who had 3 seizures in one day last 09/23/2017. Head CT and EEG normal. She has no clear epilepsy risk  factors, normal neurological exam. Seizures most likely due to benzodiazepine withdrawal, she had not been taking her medications for 3-4 days while husband was in hospital. She denies any further seizures since 08/2017. She is on clonazepam and Depakote for bipolar disorder. DMV forms will be filled out today. From a neurological standpoint, no indication for annual evaluation. She will follow-up as needed and knows to call for any changes.   Follow Up Instructions:   -I discussed the assessment and treatment plan with the patient. The patient was provided an opportunity to ask questions and all were answered. The patient agreed with the plan and demonstrated an understanding of the instructions.   The patient was advised to call back or seek an in-person evaluation if the symptoms worsen or if the condition fails to improve as anticipated.    Total Time spent in visit with the patient was:  5 minutes, of which 100% of the time was spent in counseling and/or coordinating care on the above.   Pt understands and agrees with the plan of care outlined.     Cameron Sprang, MD

## 2019-07-26 DIAGNOSIS — H353131 Nonexudative age-related macular degeneration, bilateral, early dry stage: Secondary | ICD-10-CM | POA: Diagnosis not present

## 2019-07-26 LAB — HM DIABETES EYE EXAM

## 2019-08-09 DIAGNOSIS — Z794 Long term (current) use of insulin: Secondary | ICD-10-CM | POA: Diagnosis not present

## 2019-08-09 DIAGNOSIS — E1165 Type 2 diabetes mellitus with hyperglycemia: Secondary | ICD-10-CM | POA: Diagnosis not present

## 2019-08-09 DIAGNOSIS — E11319 Type 2 diabetes mellitus with unspecified diabetic retinopathy without macular edema: Secondary | ICD-10-CM | POA: Diagnosis not present

## 2019-08-09 DIAGNOSIS — E042 Nontoxic multinodular goiter: Secondary | ICD-10-CM | POA: Diagnosis not present

## 2019-08-30 ENCOUNTER — Other Ambulatory Visit: Payer: Self-pay | Admitting: Internal Medicine

## 2019-09-17 ENCOUNTER — Other Ambulatory Visit: Payer: Self-pay | Admitting: Internal Medicine

## 2019-09-18 MED ORDER — PROPRANOLOL HCL 20 MG PO TABS: 20.0000 mg | ORAL_TABLET | Freq: Three times a day (TID) | ORAL | 1 refills | Status: DC

## 2019-09-22 ENCOUNTER — Encounter: Payer: Self-pay | Admitting: Internal Medicine

## 2019-10-09 DIAGNOSIS — E0590 Thyrotoxicosis, unspecified without thyrotoxic crisis or storm: Secondary | ICD-10-CM | POA: Diagnosis not present

## 2019-10-09 DIAGNOSIS — E042 Nontoxic multinodular goiter: Secondary | ICD-10-CM | POA: Diagnosis not present

## 2019-10-09 DIAGNOSIS — E11319 Type 2 diabetes mellitus with unspecified diabetic retinopathy without macular edema: Secondary | ICD-10-CM | POA: Diagnosis not present

## 2019-10-09 DIAGNOSIS — E1165 Type 2 diabetes mellitus with hyperglycemia: Secondary | ICD-10-CM | POA: Diagnosis not present

## 2019-10-09 DIAGNOSIS — Z794 Long term (current) use of insulin: Secondary | ICD-10-CM | POA: Diagnosis not present

## 2019-10-09 LAB — HEMOGLOBIN A1C: Hemoglobin A1C: 10.6

## 2019-10-09 LAB — HM DIABETES FOOT EXAM

## 2019-10-15 ENCOUNTER — Encounter: Payer: Self-pay | Admitting: Internal Medicine

## 2019-10-16 ENCOUNTER — Other Ambulatory Visit: Payer: Self-pay

## 2019-10-16 ENCOUNTER — Encounter: Payer: Self-pay | Admitting: Internal Medicine

## 2019-10-17 ENCOUNTER — Ambulatory Visit (INDEPENDENT_AMBULATORY_CARE_PROVIDER_SITE_OTHER): Payer: Medicare Other | Admitting: Internal Medicine

## 2019-10-17 ENCOUNTER — Encounter: Payer: Self-pay | Admitting: Internal Medicine

## 2019-10-17 ENCOUNTER — Other Ambulatory Visit: Payer: Self-pay

## 2019-10-17 VITALS — BP 185/95 | HR 107 | Temp 97.8°F | Resp 18 | Ht 60.0 in | Wt 151.1 lb

## 2019-10-17 DIAGNOSIS — I10 Essential (primary) hypertension: Secondary | ICD-10-CM | POA: Diagnosis not present

## 2019-10-17 DIAGNOSIS — F419 Anxiety disorder, unspecified: Secondary | ICD-10-CM | POA: Diagnosis not present

## 2019-10-17 DIAGNOSIS — G8929 Other chronic pain: Secondary | ICD-10-CM | POA: Diagnosis not present

## 2019-10-17 DIAGNOSIS — M542 Cervicalgia: Secondary | ICD-10-CM | POA: Diagnosis not present

## 2019-10-17 NOTE — Assessment & Plan Note (Signed)
Neck pain: The patient initially call for right shoulder pain but her symptoms are consistent with neck pain, getting worse. Unable to prescribe steroids due to diabetes. She is afraid of getting "hooked" to Flexeril. Plan:  Refer to Ortho, continue meloxicam, okay to take Flexeril, Tylenol and a heating pad. Anxiety: Under the care of psychiatry, on multiple medications, a lot of stress because he takes care of her mother who has memory impairment.  Essentially she is in charge of her care from 5 PM to 8 AM because in the daytime she spends time with the patient's sister and brother.  Patient is counseled, listening therapy provided. HTN: Used to be under better control, on amlodipine propranolol.  Propanolol has certain psychotropic properties so I do not like to change to a different beta-blocker or type of medicine.  Plan: Continue amlodipine, increase propanolol from 20 mg TID to 40 gm TID, monitor BPs. Unable to do a CPX today, will do in 2 weeks RTC CPX 2 weeks

## 2019-10-17 NOTE — Progress Notes (Signed)
Subjective:    Patient ID: Adrienne Bennett, female    DOB: October 27, 1960, 59 y.o.   MRN: [id number]  DOS:  10/17/2019 Type of visit - description: Acute Reports a long history of right "shoulder pain", increased x the last month. On further questioning, the pain is located at the lateral aspect of the right neck, increased with head motion and when she reaches out with her arms. Denies upper or lower extremity paresthesias. No fall or injury recently No gait abnormality. The pain radiates to the right trapezial area and right shoulder and sometimes radiates upwards creating a headache.  Also under a lot of stress, takes care of her mother who has dementia.  BP Readings from Last 3 Encounters:  10/17/19 (!) 185/95  06/26/19 (!) 174/78  05/29/19 (!) 147/83     Review of Systems See above   Past Medical History:  Diagnosis Date  . 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 Depression    sess Dr.Plovsky  . High cholesterol   . Hypertension   . IBS (irritable bowel syndrome)    chronic Diarrhea  . Migraine    "long long time ago; maybe 20 yr ago" (09/01/2013)  . Osteoarthritis    "back; goes down my right leg" (09/01/2013)  . Type II diabetes mellitus (HCC) 2007    Past Surgical History:  Procedure Laterality Date  . CARPAL TUNNEL RELEASE Bilateral ~ 2000  . TONSILLECTOMY  1970  . TUBAL LIGATION  1996    Allergies as of 10/17/2019      Reactions   Benztropine    Dairy Aid [lactase]    Lisinopril    Dc 06-2015, had lip swelling, stomatitis   Penicillins    Monistat 3 Combo Pack App  [miconazole Nitrate Applicator] Itching, Rash      Medication List       Accurate as of October 17, 2019  7:44 PM. If you have any questions, ask your nurse or doctor.        amLODipine 10 MG tablet Commonly known as: NORVASC Take 1 tablet (10 mg total) by mouth daily.     atorvastatin 20 MG tablet Commonly known as: LIPITOR Take 1 tablet (20 mg total) by mouth daily.   clonazePAM 1 MG tablet Commonly known as: KLONOPIN Take by mouth. 1 tab AM, 1/2 tab PM, rx by psychiatry   cyclobenzaprine 10 MG tablet Commonly known as: FLEXERIL Take 1 tablet (10 mg total) by mouth 2 (two) times daily as needed for muscle spasms.   Depakote 250 MG DR tablet Generic drug: divalproex Take 250 mg by mouth. 1 tab AM, 2 tabs PM   Ecotrin 325 MG EC tablet Generic drug: aspirin Take 325 mg by mouth in the morning and at bedtime.   insulin degludec 200 UNIT/ML FlexTouch Pen Commonly known as: TRESIBA Inject 70 Units into the skin at bedtime.   meloxicam 7.5 MG tablet Commonly known as: MOBIC Take 1 tablet (7.5 mg total) by mouth daily as needed for pain.   metFORMIN 1000 MG (MOD) 24 hr tablet Commonly known as: GLUMETZA Take 1 tablet (1,000 mg total) by mouth 2 (two) times daily with a meal.   onetouch ultrasoft lancets Check blood sugar no more than twice daily.   OneTouch Delica Lancets 33G Misc USE   TO CHECK GLUCOSE TWICE DAILY   OneTouch Verio test strip Generic drug: glucose  blood USE  STRIP TO CHECK GLUCOSE NO MORE THAN  TWICE DAILY   oxybutynin 5 MG 24 hr tablet Commonly known as: DITROPAN-XL Take 5 mg by mouth 2 (two) times daily.   propranolol 20 MG tablet Commonly known as: INDERAL Take 2 tablets (40 mg total) by mouth 3 (three) times daily. What changed: how much to take Changed by: Kathlene November, MD   temazepam 15 MG capsule Commonly known as: RESTORIL          Objective:   Physical Exam BP (!) 185/95 (BP Location: Left Arm, Patient Position: Sitting, Cuff Size: Small)   Pulse (!) 107   Temp 97.8 F (36.6 C) (Temporal)   Resp 18   Ht 5' (1.524 m)   Wt 151 lb 2 oz (68.5 kg)   SpO2 98%   BMI 29.51 kg/m  General:   Well developed, tearful, stressed. HEENT:  Normocephalic . Face symmetric, atraumatic Lungs:  CTA B Normal  respiratory effort, no intercostal retractions, no accessory muscle use. Heart: RRR,  no murmur.  Lower extremities: no pretibial edema bilaterally  Skin: Not pale. Not jaundice MSK:  Neck, range of motion decreased when she turns to the right.  No TTP at the cervical spine. Shoulders: Range of motion limited but symmetric and does not cause pain. Neurologic:  alert & oriented X3.  Speech normal, gait appropriate for age and unassisted. Motor symmetric DTRs:  Upper extremities symmetric Lower extremities:  Decrease right knee jerk, normal left knee jerk Symmetrically decrease ankle jerks Psych--  Cognition and judgment appear intact.  Cooperative with normal attention span and concentration.  Behavior appropriate. No anxious or depressed appearing.      Assessment      Assessment ENDO: started to see Dr Steffanie Dunn 12/2017 DM  HTN -- dc lisinopril 06-2015>> lips swell x1 , also had stomatitis >> resolved  High cholesterol Thyroid disease -Multinodular goiter, thyromegaly w/ dominant nodule: Negative BX 01-2015 - Hyperthyroidism, subclinical, DX 2019 --s/p thyroid ablation per patient 2019 ? IBS.Chronic diarrhea DJD Psychiatry: Sees Dr. 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 Unable to prescribe steroids due to diabetes. She is afraid of getting "hooked" to Flexeril. Plan:  Refer to Ortho, continue meloxicam, okay to take Flexeril, Tylenol and a heating pad. Anxiety: Under the care of psychiatry, on multiple medications, a lot of stress because he takes care of her mother who has memory impairment.  Essentially she is in charge of her care from 5 PM to 8 AM because in the daytime she spends time with the patient's sister and brother.  Patient  is counseled, listening therapy provided. HTN: Used to be under better control, on amlodipine propranolol.  Propanolol has certain psychotropic properties so I do not like to change to a different beta-blocker or type of medicine.  Plan: Continue amlodipine, increase propanolol from 20 mg TID to 40 gm TID, monitor BPs. Unable to do a CPX today, will do in 2 weeks RTC CPX 2 weeks  This visit occurred during the SARS-CoV-2 public health emergency.  Safety protocols were in place, including screening questions prior to the visit, additional usage of staff PPE, and extensive cleaning of exam room while observing appropriate contact time as indicated for disinfecting solutions.

## 2019-10-17 NOTE — Patient Instructions (Addendum)
We are referring you to the orthopedic doctor for your neck pain  Okay to take Flexeril twice a day if needed  Continue meloxicam  Heating pad  Okay to take Tylenol in addition to the above  Your blood pressure is elevated, increase propranolol to TWO tablets 3 times a day  Continue check the  blood pressure 3-4 times a week BP GOAL is between 110/65 and  135/85. If it is consistently higher or lower, let me know   GO TO THE FRONT DESK, please reschedule your appointments Come back for a   physical exam in 2 weeks

## 2019-10-17 NOTE — Progress Notes (Signed)
Pre visit review using our clinic review tool, if applicable. No additional management support is needed unless otherwise documented below in the visit note. 

## 2019-10-18 ENCOUNTER — Encounter: Payer: Self-pay | Admitting: Internal Medicine

## 2019-10-31 ENCOUNTER — Encounter: Payer: Medicare Other | Admitting: Internal Medicine

## 2019-11-01 ENCOUNTER — Encounter: Payer: Medicare Other | Admitting: Internal Medicine

## 2019-11-02 ENCOUNTER — Other Ambulatory Visit: Payer: Self-pay

## 2019-11-02 ENCOUNTER — Encounter: Payer: Medicare Other | Admitting: Internal Medicine

## 2019-11-02 DIAGNOSIS — M542 Cervicalgia: Secondary | ICD-10-CM | POA: Diagnosis not present

## 2019-11-03 ENCOUNTER — Ambulatory Visit (INDEPENDENT_AMBULATORY_CARE_PROVIDER_SITE_OTHER): Payer: Medicare Other | Admitting: Internal Medicine

## 2019-11-03 ENCOUNTER — Other Ambulatory Visit: Payer: Self-pay

## 2019-11-03 ENCOUNTER — Telehealth: Payer: Self-pay

## 2019-11-03 ENCOUNTER — Encounter: Payer: Self-pay | Admitting: Internal Medicine

## 2019-11-03 VITALS — BP 142/79 | HR 79 | Temp 97.4°F | Resp 18 | Ht 60.0 in | Wt 154.5 lb

## 2019-11-03 DIAGNOSIS — R29898 Other symptoms and signs involving the musculoskeletal system: Secondary | ICD-10-CM

## 2019-11-03 DIAGNOSIS — I10 Essential (primary) hypertension: Secondary | ICD-10-CM | POA: Diagnosis not present

## 2019-11-03 DIAGNOSIS — R519 Headache, unspecified: Secondary | ICD-10-CM

## 2019-11-03 DIAGNOSIS — Z0000 Encounter for general adult medical examination without abnormal findings: Secondary | ICD-10-CM | POA: Diagnosis not present

## 2019-11-03 DIAGNOSIS — M542 Cervicalgia: Secondary | ICD-10-CM | POA: Diagnosis not present

## 2019-11-03 DIAGNOSIS — G8929 Other chronic pain: Secondary | ICD-10-CM

## 2019-11-03 LAB — BASIC METABOLIC PANEL
BUN: 19 mg/dL (ref 6–23)
CO2: 35 mEq/L — ABNORMAL HIGH (ref 19–32)
Calcium: 9.2 mg/dL (ref 8.4–10.5)
Chloride: 93 mEq/L — ABNORMAL LOW (ref 96–112)
Creatinine, Ser: 0.7 mg/dL (ref 0.40–1.20)
GFR: 85.46 mL/min (ref >60.00)
Glucose, Bld: 320 mg/dL — ABNORMAL HIGH (ref 70–99)
Potassium: 4.7 mEq/L (ref 3.5–5.1)
Sodium: 134 mEq/L — ABNORMAL LOW (ref 135–145)

## 2019-11-03 LAB — CBC WITH DIFFERENTIAL/PLATELET
Basophils Absolute: 0 10*3/uL (ref 0.0–0.1)
Basophils Relative: 0.7 % (ref 0.0–3.0)
Eosinophils Absolute: 0.2 10*3/uL (ref 0.0–0.7)
Eosinophils Relative: 2.6 % (ref 0.0–5.0)
HCT: 40.6 % (ref 36.0–46.0)
Hemoglobin: 13.6 g/dL (ref 12.0–15.0)
Lymphocytes Relative: 33.8 % (ref 12.0–46.0)
Lymphs Abs: 2.1 10*3/uL (ref 0.7–4.0)
MCHC: 33.5 g/dL (ref 30.0–36.0)
MCV: 90 fl (ref 78.0–100.0)
Monocytes Absolute: 0.5 10*3/uL (ref 0.1–1.0)
Monocytes Relative: 8.3 % (ref 3.0–12.0)
Neutro Abs: 3.4 10*3/uL (ref 1.4–7.7)
Neutrophils Relative %: 54.6 % (ref 43.0–77.0)
Platelets: 222 10*3/uL (ref 150.0–400.0)
RBC: 4.51 Mil/uL (ref 3.87–5.11)
RDW: 14 % (ref 11.5–15.5)
WBC: 6.3 10*3/uL (ref 4.0–10.5)

## 2019-11-03 NOTE — Patient Instructions (Addendum)
Please schedule Medicare Wellness with Adrienne Bennett.   Per our records you are due for an eye exam. Please contact your eye doctor to schedule an appointment. Please have them send copies of your office visit notes to [place]. Our fax number is [phone].  We will check you for colon cancer with COLOGUARD,  if it came back positive, I will refer to the gastroenterologist.   Please see your gynecologist  We are referring you to the neurologist  Check the  blood pressure 2 or 3 times a month   BP GOAL is between 110/65 and  135/85. If it is consistently higher or lower, let me know    GO TO THE LAB : Get the blood work     GO TO THE FRONT DESK, PLEASE SCHEDULE YOUR APPOINTMENTS Come back for   for a checkup in 6 months

## 2019-11-03 NOTE — Progress Notes (Signed)
Pre visit review using our clinic review tool, if applicable. No additional management support is needed unless otherwise documented below in the visit note. 

## 2019-11-03 NOTE — Telephone Encounter (Signed)
Cologuard ordered through [employer] portal.  

## 2019-11-03 NOTE — Progress Notes (Signed)
Subjective:    Patient ID: Adrienne Bennett, female    DOB: 07/03/1961, 59 y.o.   MRN: [id number]  DOS:  11/03/2019 Type of visit - description: Here for CPX We also talk about other medical conditions. At the last visit complained of neck pain, it is on and off, note from Ortho reviewed. She also has headaches, they are typically associated with flareups of neck pain, no associated with nausea. Continue with the stress related to taking care of her mother.    Review of Systems  Other than above, a 14 point review of systems is negative    Past Medical History:  Diagnosis Date  . 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 Depression    sess Dr.Plovsky  . High cholesterol   . Hypertension   . IBS (irritable bowel syndrome)    chronic Diarrhea  . Migraine    "long long time ago; maybe 20 yr ago" (09/01/2013)  . Osteoarthritis    "back; goes down my right leg" (09/01/2013)  . Type II diabetes mellitus ([place]) 2007    Past Surgical History:  Procedure Laterality Date  . CARPAL TUNNEL RELEASE Bilateral ~ 2000  . TONSILLECTOMY  1970  . TUBAL LIGATION  1996   Family History  Problem Relation Age of Onset  . Diabetes Mother   . Diabetes Sister   . CAD Sister   . Hypertension Sister   . Diabetes Maternal Grandmother   . Coronary artery disease Father        age? 19s?  Marland Kitchen Coronary artery disease Brother        age?, 33s?  Marland Kitchen Hypertension Brother   . Colon cancer Neg Hx   . Breast cancer Neg Hx     Allergies as of 11/03/2019      Reactions   Benztropine    Dairy Aid [lactase]    Lisinopril    Dc 06-2015, had lip swelling, stomatitis   Penicillins    Monistat 3 Combo Pack App  [miconazole Nitrate Applicator] Itching, Rash      Medication List       Accurate as of November 03, 2019 11:59 PM. If you have any questions, ask your nurse or doctor.          amLODipine 10 MG tablet Commonly known as: NORVASC Take 1 tablet (10 mg total) by mouth daily.   atorvastatin 20 MG tablet Commonly known as: LIPITOR Take 1 tablet (20 mg total) by mouth daily.   clonazePAM 1 MG tablet Commonly known as: KLONOPIN Take by mouth. 1 tab AM, 1/2 tab PM, rx by psychiatry   cyclobenzaprine 10 MG tablet Commonly known as: FLEXERIL Take 1 tablet (10 mg total) by mouth 2 (two) times daily as needed for muscle spasms.   Depakote 250 MG DR tablet Generic drug: divalproex Take 250 mg by mouth. 1 tab AM, 2 tabs PM   Ecotrin 325 MG EC tablet Generic drug: aspirin Take 325 mg by mouth in the morning and at bedtime.   insulin degludec 200 UNIT/ML FlexTouch Pen Commonly known as: TRESIBA Inject 70 Units into the skin at bedtime.   meloxicam 7.5 MG tablet Commonly known as: MOBIC Take 1 tablet (7.5 mg total) by mouth daily as needed for pain.   metFORMIN 1000 MG (MOD) 24 hr tablet Commonly known as: GLUMETZA Take 1 tablet (1,000 mg total) by mouth 2 (two)  times daily with a meal.   onetouch ultrasoft lancets Check blood sugar no more than twice daily.   OneTouch Delica Lancets 33G Misc USE   TO CHECK GLUCOSE TWICE DAILY   OneTouch Verio test strip Generic drug: glucose blood USE  STRIP TO CHECK GLUCOSE NO MORE THAN  TWICE DAILY   oxybutynin 5 MG 24 hr tablet Commonly known as: DITROPAN-XL Take 5 mg by mouth 2 (two) times daily.   propranolol 20 MG tablet Commonly known as: INDERAL Take 2 tablets (40 mg total) by mouth 3 (three) times daily.   temazepam 15 MG capsule Commonly known as: RESTORIL          Objective:   Physical Exam BP (!) 142/79 (BP Location: Left Arm, Patient Position: Sitting, Cuff Size: Small)   Pulse 79   Temp (!) 97.4 F (36.3 C) (Temporal)   Resp 18   Ht 5' (1.524 m)   Wt 154 lb 8 oz (70.1 kg)   SpO2 95%   BMI 30.17 kg/m  General: Well developed, NAD, BMI noted Neck: No  thyromegaly  HEENT:   Normocephalic . Face symmetric, atraumatic Lungs:  CTA B Normal respiratory effort, no intercostal retractions, no accessory muscle use. Heart: RRR,  no murmur.  Abdomen:  Not distended, soft, non-tender. No rebound or rigidity.   Lower extremities: no pretibial edema bilaterally  Skin: Exposed areas without rash. Not pale. Not jaundice Neurologic:  alert & oriented X3.  Speech normal, gait appropriate for age and unassisted DTRs: Upper extremities normal Decreased throughout lower extremities Motor: subtle L LE weakness  Psych: Cognition and judgment appear intact.  Cooperative with normal attention span and concentration.  Behavior appropriate. No anxious or depressed appearing.     Assessment    Assessment ENDO: started to see Dr Morrison Old 12/2017 DM  HTN -- dc lisinopril 06-2015>> lips swell x1 , also had stomatitis >> resolved  High cholesterol Thyroid disease -Multinodular goiter, thyromegaly w/ dominant nodule: Negative BX 01-2015 - Hyperthyroidism, subclinical, DX 2019 --s/p thyroid ablation per patient 2019 ? IBS.Chronic diarrhea DJD Psychiatry: Sees Dr. 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  Recheck in few months. Neck pain: Note from orthopedics reviewed, they did not recommend MRI, rather first conservative treatment and possibly injection.  They also recommend to see PCP for anxiety and headaches. Motor deficit L LE and decreased reflexes in both  LE: Refer to neurology. Headaches: They seem to be cervicogenic.  Observation for now Depression, bipolar, PTSD: Sees psychiatry, she is a stress due to taking care of her 84 year old mother.  Counseled.  Adjustment of medicines per psychiatry. RTC 6 months  In addition to CPX,  we discussed neck pain, neurological findings, headaches.     This visit occurred during the SARS-CoV-2 public health emergency.  Safety protocols were in place, including screening questions prior to the visit, additional usage of staff PPE, and extensive cleaning of exam room while observing appropriate contact time as indicated for disinfecting solutions.

## 2019-11-05 NOTE — Assessment & Plan Note (Signed)
Here for CPX DM: Per endocrinology HTN, seems controlled, no changes High cholesterol: Last FLP great, not fasting today.  Recheck in few months. Neck pain: Note from orthopedics reviewed, they did not recommend MRI, rather first conservative treatment and possibly injection.  They also recommend to see PCP for anxiety and headaches. Motor deficit L LE and decreased reflexes in both  LE: Refer to neurology. Headaches: They seem to be cervicogenic.  Observation for now Depression, bipolar, PTSD: Sees psychiatry, she is a stress due to taking care of her 15 year old mother.  Counseled.  Adjustment of medicines per psychiatry. RTC 6 months

## 2019-11-05 NOTE — Assessment & Plan Note (Signed)
-  Tdap: 2013 - prevnar: 2016;  Pneumonia shot 2010 - s/p shingrex x 2  -Covid vaccinations : had the first injection already -CCS:  Colonoscopy 07-2010, Dr. Randa Evens, had polyps;  states she "would never do that again". See comments from 2017 . iFOB neg 2017, 2018; d/w pt cologuard vs iFOB, elected cologuard, strongly recommend to check with his insurance for coverage.  Also aware that if any of the above tests is (+), will have to see GI -Female care: menopausal since 2016 Sees Dr Luna Kitchens, got a letter from her, due for PAP MMG DEXA (per pt), encouraged to proceed  Last MMG 12-2017  -Diet and exercise: Counseled + FH CAD:on ASA  -Labs: CMP, CBC, Cologuard

## 2019-11-17 ENCOUNTER — Encounter: Payer: Self-pay | Admitting: Internal Medicine

## 2019-11-20 ENCOUNTER — Other Ambulatory Visit: Payer: Self-pay | Admitting: Internal Medicine

## 2019-11-25 ENCOUNTER — Other Ambulatory Visit: Payer: Self-pay | Admitting: Internal Medicine

## 2019-12-04 ENCOUNTER — Encounter: Payer: Self-pay | Admitting: Internal Medicine

## 2019-12-04 ENCOUNTER — Other Ambulatory Visit: Payer: Self-pay

## 2019-12-04 ENCOUNTER — Ambulatory Visit (INDEPENDENT_AMBULATORY_CARE_PROVIDER_SITE_OTHER): Payer: Medicare Other | Admitting: Internal Medicine

## 2019-12-04 VITALS — BP 159/71 | HR 95 | Temp 97.4°F | Resp 16 | Ht 60.0 in | Wt 152.4 lb

## 2019-12-04 DIAGNOSIS — I10 Essential (primary) hypertension: Secondary | ICD-10-CM | POA: Diagnosis not present

## 2019-12-04 DIAGNOSIS — K148 Other diseases of tongue: Secondary | ICD-10-CM | POA: Diagnosis not present

## 2019-12-04 MED ORDER — PROPRANOLOL HCL ER 120 MG PO CP24: 120.0000 mg | ORAL_CAPSULE | Freq: Every day | ORAL | 1 refills | Status: DC

## 2019-12-04 NOTE — Progress Notes (Signed)
Subjective:    Patient ID: Adrienne Bennett, female    DOB: 04-Apr-1961, 59 y.o.   MRN: [id number]  DOS:  12/04/2019 Type of visit - description: Acute 4 days ago noted lump at the tongue: Red, sore, white ring around it. Today it looks a little better. She is not taking any new medications. No fever chills No injury or trauma to that area of the tongue that she can tell  BP noted to be elevated, see assessment and plan  BP Readings from Last 3 Encounters:  12/04/19 (!) 159/71  11/03/19 (!) 142/79  10/17/19 (!) 185/95     Review of Systems See above   Past Medical History:  Diagnosis Date  . 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 Depression    sess Dr.Plovsky  . High cholesterol   . Hypertension   . IBS (irritable bowel syndrome)    chronic Diarrhea  . Migraine    "long long time ago; maybe 20 yr ago" (09/01/2013)  . Osteoarthritis    "back; goes down my right leg" (09/01/2013)  . Type II diabetes mellitus ([place]) 2007    Past Surgical History:  Procedure Laterality Date  . CARPAL TUNNEL RELEASE Bilateral ~ 2000  . TONSILLECTOMY  1970  . TUBAL LIGATION  1996    Allergies as of 12/04/2019      Reactions   Benztropine    Dairy Aid [lactase]    Lisinopril    Dc 06-2015, had lip swelling, stomatitis   Penicillins    Monistat 3 Combo Pack App  [miconazole Nitrate Applicator] Itching, Rash      Medication List       Accurate as of Dec 04, 2019 11:36 AM. If you have any questions, ask your nurse or doctor.        amLODipine 10 MG tablet Commonly known as: NORVASC Take 1 tablet (10 mg total) by mouth daily.   atorvastatin 20 MG tablet Commonly known as: LIPITOR Take 1 tablet (20 mg total) by mouth daily.   clonazePAM 1 MG tablet Commonly known as: KLONOPIN Take by mouth. 1 tab AM, 1/2 tab PM, rx by psychiatry   cyclobenzaprine 10 MG  tablet Commonly known as: FLEXERIL Take 1 tablet (10 mg total) by mouth 2 (two) times daily as needed for muscle spasms.   Depakote 250 MG DR tablet Generic drug: divalproex Take 250 mg by mouth. 1 tab AM, 2 tabs PM   Ecotrin 325 MG EC tablet Generic drug: aspirin Take 325 mg by mouth in the morning and at bedtime.   insulin degludec 200 UNIT/ML FlexTouch Pen Commonly known as: TRESIBA Inject 70 Units into the skin at bedtime.   meloxicam 7.5 MG tablet Commonly known as: MOBIC Take 1 tablet (7.5 mg total) by mouth daily as needed for pain.   metFORMIN 1000 MG (MOD) 24 hr tablet Commonly known as: GLUMETZA Take 1 tablet (1,000 mg total) by mouth 2 (two) times daily with a meal.   onetouch ultrasoft lancets Check blood sugar no more than twice daily.   OneTouch Delica Lancets 62I Misc USE   TO CHECK GLUCOSE TWICE DAILY   OneTouch Verio test strip Generic drug: glucose blood USE  STRIP TO CHECK GLUCOSE NO MORE THAN  TWICE DAILY   oxybutynin 5 MG 24 hr tablet Commonly known as: DITROPAN-XL Take 5 mg by mouth 2 (two) times daily.  propranolol 20 MG tablet Commonly known as: INDERAL Take 2 tablets (40 mg total) by mouth 3 (three) times daily.   temazepam 15 MG capsule Commonly known as: RESTORIL          Objective:   Physical Exam BP (!) 159/71 (BP Location: Left Arm, Patient Position: Sitting, Cuff Size: Small)   Pulse 95   Temp (!) 97.4 F (36.3 C) (Temporal)   Resp 16   Ht 5' (1.524 m)   Wt 152 lb 6 oz (69.1 kg)   SpO2 96%   BMI 29.76 kg/m   General:   Well developed, NAD, BMI noted. HEENT:  Normocephalic . Face symmetric, atraumatic. Tongue: Has a lesion, see picture, the lesion is firm but not hard.  The rest of the mouth inspection and palpation is pretty benign. Neck: No lymphadenopathy or thyromegaly Skin: Not pale. Not jaundice Neurologic:  alert & oriented X3.  Speech normal, gait appropriate for age and unassisted Psych--  Cognition and  judgment appear intact.  Cooperative with normal attention span and concentration.  Behavior appropriate. No anxious or depressed appearing.        Assessment     Assessment ENDO: started to see Dr Morrison Old 12/2017 DM  HTN -- dc lisinopril 06-2015>> lips swell x1 , also had stomatitis >> resolved  High cholesterol Thyroid disease -Multinodular goiter, thyromegaly w/ dominant nodule: Negative BX 01-2015 - Hyperthyroidism, subclinical, DX 2019 --s/p thyroid ablation per patient 2019 ? IBS.Chronic diarrhea DJD Psychiatry: Sees Dr. 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  She really does not like to see another doctor, since the lesion appear only 4 days ago then I agreed to see her in 4 weeks to be sure the area is back to normal.  In the meantime if the area gets harder, start bleeding or get worse she is to let me know. HTN: Elevated today, at home is slightly elevated in the 150s, she admits that her midday Inderal dose is frequently forgotten. Plan: DC rapid release Inderal, start Inderal XL 120 mg 1 tablet daily.  Continue amlodipine, monitor BPs. RTC 4 weeks   This visit occurred during the SARS-CoV-2 public health emergency.  Safety protocols were in place, including screening questions prior to the visit, additional usage of staff PPE, and extensive cleaning of exam room while observing appropriate contact time as indicated for disinfecting solutions.

## 2019-12-04 NOTE — Patient Instructions (Addendum)
Please schedule Medicare Wellness with Adrienne Bennett.   Per our records you are due for an eye exam. Please contact your eye doctor to schedule an appointment. Please have them send copies of your office visit notes to [place]. Our fax number is [phone].    Stop Inderal 3 times a day  Start the new Inderal 120 mg: 1 tablet daily only.  Continue checking your blood pressure BP GOAL is between 110/65 and  135/85. If it is consistently higher or lower, let me know  If the area in your tongue is getting worse call anytime otherwise I will see you in 4 weeks for a checkup  GO TO THE FRONT DESK, PLEASE SCHEDULE YOUR APPOINTMENTS Come back for   a checkup in 4 weeks

## 2019-12-04 NOTE — Progress Notes (Signed)
Pre visit review using our clinic review tool, if applicable. No additional management support is needed unless otherwise documented below in the visit note. 

## 2019-12-05 NOTE — Assessment & Plan Note (Signed)
Tongue lesion: New problem Etiology not completely clear, rec ENT for further eval.  She really does not like to see another doctor, since the lesion appear only 4 days ago then I agreed to see her in 4 weeks to be sure the area is back to normal.  In the meantime if the area gets harder, start bleeding or get worse she is to let me know. HTN: Elevated today, at home is slightly elevated in the 150s, she admits that her midday Inderal dose is frequently forgotten. Plan: DC rapid release Inderal, start Inderal XL 120 mg 1 tablet daily.  Continue amlodipine, monitor BPs. RTC 4 weeks

## 2020-01-01 ENCOUNTER — Ambulatory Visit (INDEPENDENT_AMBULATORY_CARE_PROVIDER_SITE_OTHER): Payer: Medicare Other | Admitting: Internal Medicine

## 2020-01-01 ENCOUNTER — Other Ambulatory Visit: Payer: Self-pay

## 2020-01-01 ENCOUNTER — Encounter: Payer: Self-pay | Admitting: Internal Medicine

## 2020-01-01 VITALS — BP 141/57 | HR 85 | Temp 97.6°F | Resp 18 | Ht 60.0 in | Wt 154.1 lb

## 2020-01-01 DIAGNOSIS — K148 Other diseases of tongue: Secondary | ICD-10-CM | POA: Diagnosis not present

## 2020-01-01 NOTE — Progress Notes (Signed)
Pre visit review using our clinic review tool, if applicable. No additional management support is needed unless otherwise documented below in the visit note. 

## 2020-01-01 NOTE — Progress Notes (Signed)
Subjective:    Patient ID: Adrienne Bennett, female    DOB: Nov 21, 1960, 59 y.o.   MRN: [id number]  DOS:  01/01/2020 Type of visit - description: Acute She reports ongoing problems with a tongue lesion. From time to time it fills up with blood and gets a slightly bigger makes it difficult to talk, she has self lanced it and obtained some bloody material.   Review of Systems No other concerns  Past Medical History:  Diagnosis Date  . 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 Depression    sess Dr.Plovsky  . High cholesterol   . Hypertension   . IBS (irritable bowel syndrome)    chronic Diarrhea  . Migraine    "long long time ago; maybe 20 yr ago" (09/01/2013)  . Osteoarthritis    "back; goes down my right leg" (09/01/2013)  . Type II diabetes mellitus ([facility]) 2007    Past Surgical History:  Procedure Laterality Date  . CARPAL TUNNEL RELEASE Bilateral ~ 2000  . TONSILLECTOMY  1970  . TUBAL LIGATION  1996    Allergies as of 01/01/2020      Reactions   Benztropine    Dairy Aid [lactase]    Lisinopril    Dc 06-2015, had lip swelling, stomatitis   Penicillins    Monistat 3 Combo Pack App  [miconazole Nitrate Applicator] Itching, Rash      Medication List       Accurate as of January 01, 2020 11:59 PM. If you have any questions, ask your nurse or doctor.        amLODipine 10 MG tablet Commonly known as: NORVASC Take 1 tablet (10 mg total) by mouth daily.   atorvastatin 20 MG tablet Commonly known as: LIPITOR Take 1 tablet (20 mg total) by mouth daily.   clonazePAM 1 MG tablet Commonly known as: KLONOPIN Take by mouth. 1 tab AM, 1/2 tab PM, rx by psychiatry   cyclobenzaprine 10 MG tablet Commonly known as: FLEXERIL Take 1 tablet (10 mg total) by mouth 2 (two) times daily as needed for muscle spasms.   Depakote 250 MG DR tablet Generic drug:  divalproex Take 250 mg by mouth. 1 tab AM, 2 tabs PM   Ecotrin 325 MG EC tablet Generic drug: aspirin Take 325 mg by mouth in the morning and at bedtime.   insulin degludec 200 UNIT/ML FlexTouch Pen Commonly known as: TRESIBA Inject 70 Units into the skin at bedtime.   meloxicam 7.5 MG tablet Commonly known as: MOBIC Take 1 tablet (7.5 mg total) by mouth daily as needed for pain.   metFORMIN 1000 MG (MOD) 24 hr tablet Commonly known as: GLUMETZA Take 1 tablet (1,000 mg total) by mouth 2 (two) times daily with a meal.   onetouch ultrasoft lancets Check blood sugar no more than twice daily.   OneTouch Delica Lancets 33G Misc USE   TO CHECK GLUCOSE TWICE DAILY   OneTouch Verio test strip Generic drug: glucose blood USE  STRIP TO CHECK GLUCOSE NO MORE THAN  TWICE DAILY   oxybutynin 5 MG 24 hr tablet Commonly known as: DITROPAN-XL Take 5 mg by mouth 2 (two) times daily.   propranolol ER 120 MG 24 hr capsule Commonly known as: Inderal LA Take 1 capsule (120 mg total) by mouth daily.   temazepam 15 MG capsule Commonly known as: RESTORIL  Objective:   Physical Exam BP (!) 141/57 (BP Location: Left Arm, Patient Position: Sitting, Cuff Size: Normal)   Pulse 85   Temp 97.6 F (36.4 C) (Temporal)   Resp 18   Ht 5' (1.524 m)   Wt 154 lb 2 oz (69.9 kg)   SpO2 97%   BMI 30.10 kg/m    General:   Well developed, NAD, BMI noted. HEENT:  Normocephalic . Face symmetric, atraumatic. Very prominent parotid glands but symmetric.  No obvious neck lymphadenopathies or masses Oral cavity: See picture, tongue lesion perhaps is slightly larger than before.  Poor oral hygiene. Neurologic:  alert & oriented X3.  Speech normal, gait appropriate for age and unassisted Psych--  Cognition and judgment appear intact.  Cooperative with normal attention span and concentration.  Behavior appropriate. No anxious or depressed appearing.      Assessment       Assessment ENDO: started to see Dr Steffanie Dunn 12/2017 DM  HTN -- dc lisinopril 06-2015>> lips swell x1 , also had stomatitis >> resolved  High cholesterol Thyroid disease -Multinodular goiter, thyromegaly w/ dominant nodule: Negative BX 01-2015 - Hyperthyroidism, subclinical, DX 2019 --s/p thyroid ablation per patient 2019 ? IBS.Chronic diarrhea DJD Psychiatry: Sees Dr. 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  She reports no self harming on that area (history of self mutilation) other than lancet the lesion it  one or two times when it got bigger and was filled with blood. Plan: Refer to ENT, see AVS.   This visit occurred during the SARS-CoV-2 public health emergency.  Safety protocols were in place, including screening questions prior to the visit, additional usage of staff PPE, and extensive cleaning of exam room while observing appropriate contact time as indicated for disinfecting solutions.

## 2020-01-01 NOTE — Patient Instructions (Addendum)
Please schedule Medicare Wellness with [name].   Per our records you are due for an eye exam. Please contact your eye doctor to schedule an appointment. Please have them send copies of your office visit notes to [place]. Our fax number is [phone]. Please avoid touching that area or lancet it.  If it gets much bigger and difficult your breathing or swallowing: Go to the emergency room

## 2020-01-02 NOTE — Assessment & Plan Note (Signed)
Tongue lesion: Ongoing problem, exam today shows perhaps a slightly larger lesion.  She reports no self harming on that area (history of self mutilation) other than lancet the lesion it  one or two times when it got bigger and was filled with blood. Plan: Refer to ENT, see AVS.

## 2020-01-11 DIAGNOSIS — E0590 Thyrotoxicosis, unspecified without thyrotoxic crisis or storm: Secondary | ICD-10-CM | POA: Diagnosis not present

## 2020-01-11 DIAGNOSIS — Z794 Long term (current) use of insulin: Secondary | ICD-10-CM | POA: Diagnosis not present

## 2020-01-11 DIAGNOSIS — E042 Nontoxic multinodular goiter: Secondary | ICD-10-CM | POA: Diagnosis not present

## 2020-01-11 DIAGNOSIS — E1165 Type 2 diabetes mellitus with hyperglycemia: Secondary | ICD-10-CM | POA: Diagnosis not present

## 2020-01-11 DIAGNOSIS — E11319 Type 2 diabetes mellitus with unspecified diabetic retinopathy without macular edema: Secondary | ICD-10-CM | POA: Diagnosis not present

## 2020-01-17 DIAGNOSIS — K148 Other diseases of tongue: Secondary | ICD-10-CM | POA: Diagnosis not present

## 2020-01-17 DIAGNOSIS — K140 Glossitis: Secondary | ICD-10-CM | POA: Diagnosis not present

## 2020-02-01 ENCOUNTER — Encounter: Payer: Self-pay | Admitting: Internal Medicine

## 2020-02-01 DIAGNOSIS — H353190 Nonexudative age-related macular degeneration, unspecified eye, stage unspecified: Secondary | ICD-10-CM | POA: Insufficient documentation

## 2020-03-27 ENCOUNTER — Other Ambulatory Visit: Payer: Self-pay | Admitting: Internal Medicine

## 2020-04-10 DIAGNOSIS — E113292 Type 2 diabetes mellitus with mild nonproliferative diabetic retinopathy without macular edema, left eye: Secondary | ICD-10-CM | POA: Diagnosis not present

## 2020-04-10 LAB — HM DIABETES EYE EXAM

## 2020-04-15 DIAGNOSIS — Z794 Long term (current) use of insulin: Secondary | ICD-10-CM | POA: Diagnosis not present

## 2020-04-15 DIAGNOSIS — E042 Nontoxic multinodular goiter: Secondary | ICD-10-CM | POA: Diagnosis not present

## 2020-04-15 DIAGNOSIS — E11319 Type 2 diabetes mellitus with unspecified diabetic retinopathy without macular edema: Secondary | ICD-10-CM | POA: Diagnosis not present

## 2020-04-15 DIAGNOSIS — Z23 Encounter for immunization: Secondary | ICD-10-CM | POA: Diagnosis not present

## 2020-04-15 DIAGNOSIS — E1165 Type 2 diabetes mellitus with hyperglycemia: Secondary | ICD-10-CM | POA: Diagnosis not present

## 2020-04-15 LAB — COMPREHENSIVE METABOLIC PANEL
Albumin: 4.1 (ref 3.5–5.0)
Calcium: 9.4 (ref 8.7–10.7)
GFR calc Af Amer: 107
GFR calc non Af Amer: 93
Globulin: 3.9

## 2020-04-15 LAB — LIPID PANEL
Cholesterol: 166 (ref 0–200)
HDL: 41 (ref 35–70)
LDL Cholesterol: 93
Triglycerides: 187 — AB (ref 40–160)

## 2020-04-15 LAB — BASIC METABOLIC PANEL
BUN: 21 (ref 4–21)
CO2: 26 — AB (ref 13–22)
Chloride: 94 — AB (ref 99–108)
Creatinine: 0.7 (ref 0.5–1.1)
Glucose: 352
Potassium: 5 (ref 3.4–5.3)
Sodium: 134 — AB (ref 137–147)

## 2020-04-15 LAB — HEPATIC FUNCTION PANEL
ALT: 43 — AB (ref 7–35)
AST: 45 — AB (ref 13–35)
Alkaline Phosphatase: 138 — AB (ref 25–125)
Bilirubin, Total: 0.3

## 2020-04-15 LAB — TSH: TSH: 1.4 (ref 0.41–5.90)

## 2020-04-15 LAB — HEMOGLOBIN A1C: Hemoglobin A1C: 11.5

## 2020-04-25 ENCOUNTER — Encounter: Payer: Self-pay | Admitting: Internal Medicine

## 2020-04-25 ENCOUNTER — Ambulatory Visit: Payer: Medicare Other | Admitting: Family Medicine

## 2020-04-25 DIAGNOSIS — Z0289 Encounter for other administrative examinations: Secondary | ICD-10-CM

## 2020-04-25 NOTE — Telephone Encounter (Signed)
Appointment scheduled today with Dr. Patsy Lager at 140pm

## 2020-04-29 ENCOUNTER — Encounter (HOSPITAL_BASED_OUTPATIENT_CLINIC_OR_DEPARTMENT_OTHER): Payer: Self-pay | Admitting: *Deleted

## 2020-04-29 ENCOUNTER — Emergency Department (HOSPITAL_BASED_OUTPATIENT_CLINIC_OR_DEPARTMENT_OTHER)
Admission: EM | Admit: 2020-04-29 | Discharge: 2020-04-29 | Disposition: A | Discharge status: Home / Self Care | Payer: Medicare Other | Attending: Emergency Medicine | Admitting: Emergency Medicine

## 2020-04-29 ENCOUNTER — Encounter: Payer: Self-pay | Admitting: Family

## 2020-04-29 ENCOUNTER — Ambulatory Visit (INDEPENDENT_AMBULATORY_CARE_PROVIDER_SITE_OTHER): Payer: Medicare Other | Admitting: Family

## 2020-04-29 ENCOUNTER — Emergency Department (HOSPITAL_BASED_OUTPATIENT_CLINIC_OR_DEPARTMENT_OTHER): Payer: Medicare Other

## 2020-04-29 ENCOUNTER — Other Ambulatory Visit: Payer: Self-pay

## 2020-04-29 VITALS — BP 143/70 | HR 85 | Temp 98.1°F | Resp 16 | Ht 61.0 in | Wt 153.0 lb

## 2020-04-29 DIAGNOSIS — K760 Fatty (change of) liver, not elsewhere classified: Secondary | ICD-10-CM | POA: Diagnosis not present

## 2020-04-29 DIAGNOSIS — Z7984 Long term (current) use of oral hypoglycemic drugs: Secondary | ICD-10-CM | POA: Diagnosis not present

## 2020-04-29 DIAGNOSIS — R1010 Upper abdominal pain, unspecified: Secondary | ICD-10-CM | POA: Diagnosis present

## 2020-04-29 DIAGNOSIS — R1032 Left lower quadrant pain: Secondary | ICD-10-CM | POA: Diagnosis not present

## 2020-04-29 DIAGNOSIS — E11319 Type 2 diabetes mellitus with unspecified diabetic retinopathy without macular edema: Secondary | ICD-10-CM | POA: Insufficient documentation

## 2020-04-29 DIAGNOSIS — R197 Diarrhea, unspecified: Secondary | ICD-10-CM

## 2020-04-29 DIAGNOSIS — I10 Essential (primary) hypertension: Secondary | ICD-10-CM | POA: Insufficient documentation

## 2020-04-29 DIAGNOSIS — Z7982 Long term (current) use of aspirin: Secondary | ICD-10-CM | POA: Diagnosis not present

## 2020-04-29 DIAGNOSIS — Z79899 Other long term (current) drug therapy: Secondary | ICD-10-CM | POA: Diagnosis not present

## 2020-04-29 DIAGNOSIS — Z794 Long term (current) use of insulin: Secondary | ICD-10-CM | POA: Insufficient documentation

## 2020-04-29 DIAGNOSIS — R109 Unspecified abdominal pain: Secondary | ICD-10-CM | POA: Diagnosis not present

## 2020-04-29 LAB — CBC
HCT: 42.8 % (ref 36.0–46.0)
Hemoglobin: 13.6 g/dL (ref 12.0–15.0)
MCH: 29.6 pg (ref 26.0–34.0)
MCHC: 31.8 g/dL (ref 30.0–36.0)
MCV: 93.2 fL (ref 80.0–100.0)
Platelets: 253 10*3/uL (ref 150–400)
RBC: 4.59 MIL/uL (ref 3.87–5.11)
RDW: 13.4 % (ref 11.5–15.5)
WBC: 7.5 10*3/uL (ref 4.0–10.5)
nRBC: 0 % (ref 0.0–0.2)

## 2020-04-29 LAB — COMPREHENSIVE METABOLIC PANEL
ALT: 53 U/L — ABNORMAL HIGH (ref 0–44)
AST: 65 U/L — ABNORMAL HIGH (ref 15–41)
Albumin: 3.4 g/dL — ABNORMAL LOW (ref 3.5–5.0)
Alkaline Phosphatase: 82 U/L (ref 38–126)
Anion gap: 10 (ref 5–15)
BUN: 26 mg/dL — ABNORMAL HIGH (ref 6–20)
CO2: 28 mmol/L (ref 22–32)
Calcium: 9 mg/dL (ref 8.9–10.3)
Chloride: 97 mmol/L — ABNORMAL LOW (ref 98–111)
Creatinine, Ser: 0.84 mg/dL (ref 0.44–1.00)
GFR calc Af Amer: >60 mL/min (ref >60)
GFR calc non Af Amer: >60 mL/min (ref >60)
Glucose, Bld: 110 mg/dL — ABNORMAL HIGH (ref 70–99)
Potassium: 4.6 mmol/L (ref 3.5–5.1)
Sodium: 135 mmol/L (ref 135–145)
Total Bilirubin: 0.2 mg/dL — ABNORMAL LOW (ref 0.3–1.2)
Total Protein: 7.9 g/dL (ref 6.5–8.1)

## 2020-04-29 LAB — URINALYSIS, ROUTINE W REFLEX MICROSCOPIC
Bilirubin Urine: NEGATIVE
Glucose, UA: 100 mg/dL — AB
Hgb urine dipstick: NEGATIVE
Ketones, ur: NEGATIVE mg/dL
Nitrite: NEGATIVE
Protein, ur: 100 mg/dL — AB
Specific Gravity, Urine: 1.025 (ref 1.005–1.030)
pH: 5.5 (ref 5.0–8.0)

## 2020-04-29 LAB — URINALYSIS, MICROSCOPIC (REFLEX)

## 2020-04-29 LAB — LIPASE, BLOOD: Lipase: 25 U/L (ref 11–51)

## 2020-04-29 MED ORDER — IOHEXOL 300 MG/ML  SOLN
100.0000 mL | Freq: Once | INTRAMUSCULAR | Status: AC | PRN
  Administered 2020-04-29: 100 mL via INTRAVENOUS

## 2020-04-29 MED ORDER — TRAMADOL HCL 50 MG PO TABS: 50.0000 mg | ORAL_TABLET | Freq: Three times a day (TID) | ORAL | 0 refills | Status: AC | PRN

## 2020-04-29 NOTE — Addendum Note (Signed)
Addended by: Wilford Corner on: 04/29/2020 10:05 AM   Modules accepted: Orders

## 2020-04-29 NOTE — ED Notes (Signed)
Pt. Speaks of wanting to eat before getting into room just before going into the room after her CT scan.

## 2020-04-29 NOTE — Discharge Instructions (Addendum)
Your CT scan your CT scan is reassuring. Please treat your symptoms with over-the-counter medications, oral hydration.  You can take Imodium as needed for diarrhea. Follow-up with your primary care provider regarding your visit today. It is recommended you have your liver enzymes rechecked by your primary care. Return to the emergency department for severely worsening abdominal pain, fever, uncontrollable vomiting, new or concerning symptoms.

## 2020-04-29 NOTE — Addendum Note (Signed)
Addended by: Mervin Kung A on: 04/29/2020 09:10 AM   Modules accepted: Orders

## 2020-04-29 NOTE — Patient Instructions (Addendum)
Please complete lab work prior to leaving. We will arrange a CT scan for this afternoon once we get insurance approval. For pain, you may use tramdol as needed- but do not drive after taking. Go to the ER if you develop severe/worsening abdominal pain.  Stop by imaging on your way out to pick up your contrast- they will tell you when to return this afternoon.

## 2020-04-29 NOTE — ED Triage Notes (Signed)
Abdominal pain x 4 days. Pain in localized to her left lower quadrant.
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
## 2020-04-29 NOTE — ED Provider Notes (Signed)
[facility] EMERGENCY DEPARTMENT Provider Note   CSN: [id number] Arrival date & time: 04/29/20  1101     History Chief Complaint  Patient presents with  . Abdominal Pain    Adrienne Bennett is a 59 y.o. female past medical history of IBS, type 2 diabetes, hyperlipidemia, hypertension, presenting emergency department complaint of upper quadrant abdominal pain began 4 days ago.  Patient states pain is intermittent, sharp, worse with sitting, improved with standing.  She states she had some constipation yesterday now has some diarrhea today.  Denies associated fevers or chills, urinary symptoms, nausea or vomiting.  She is treated her symptoms with aspirin with some moderate relief.  No known history of diverticulosis.  The history is provided by the patient.       Past Medical History:  Diagnosis Date  . 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 Depression    sess Dr.Plovsky  . High cholesterol   . Hypertension   . IBS (irritable bowel syndrome)    chronic Diarrhea  . Macular degeneration, dry   . Migraine    "long long time ago; maybe 20 yr ago" (09/01/2013)  . Osteoarthritis    "back; goes down my right leg" (09/01/2013)  . Type II diabetes mellitus (HCC) 2007    Patient Active Problem List   Diagnosis Date Noted  . Macular degeneration, dry 02/01/2020  . Diabetic retinopathy (HCC) 03/31/2018  . Subclinical hyperthyroidism 02/26/2018  . GERD (gastroesophageal reflux disease) 05/23/2017  . Mouth sores 04/15/2017  . DJD (degenerative joint disease) 03/04/2016  . PCP NOTES >>>>>>>>>> 12/02/2015  . Thyromegaly 12/04/2014  . Hyperlipidemia 10/12/2012  . Bipolar affective disorder (HCC)   . Annual physical exam 07/13/2012  . Neck pain 02/03/2011  . Essential hypertension 09/04/2010  . GANGLION CYST 07/22/2010  . MIGRAINE HEADACHE 03/13/2010  . DM  (diabetes mellitus), secondary uncontrolled (HCC) 07/16/2008  . IBS ? 07/16/2008    Past Surgical History:  Procedure Laterality Date  . CARPAL TUNNEL RELEASE Bilateral ~ 2000  . TONSILLECTOMY  1970  . TUBAL LIGATION  1996     OB History   No obstetric history on file.     Family History  Problem Relation Age of Onset  . Diabetes Mother   . Diabetes Sister   . CAD Sister   . Hypertension Sister   . Diabetes Maternal Grandmother   . Coronary artery disease Father        age? 50s?  Marland Kitchen Coronary artery disease Brother        age?, 73s?  Marland Kitchen Hypertension Brother   . Colon cancer Neg Hx   . Breast cancer Neg Hx     Social History   Tobacco Use  . Smoking status: Never Smoker  . Smokeless tobacco: Never Used  Vaping Use  . Vaping Use: Never used  Substance Use Topics  . Alcohol use: No    Alcohol/week: 0.0 standard drinks  . Drug use: No    Home Medications Prior to Admission medications   Medication Sig Start Date End Date Taking? Authorizing Provider  amLODipine (NORVASC) 10 MG tablet Take 1 tablet (10 mg total) by mouth daily. 11/27/19   [name], MD  aspirin (ECOTRIN) 325 MG EC tablet Take 325 mg by mouth in the morning and at bedtime.    [provider]  atorvastatin (LIPITOR) 20 MG tablet Take 1  tablet (20 mg total) by mouth daily. 03/06/19   [name], MD  clonazePAM (KLONOPIN) 1 MG tablet Take by mouth. 1 tab AM, 1/2 tab PM, rx by psychiatry    [provider]  cyclobenzaprine (FLEXERIL) 10 MG tablet Take 1 tablet (10 mg total) by mouth 2 (two) times daily as needed for muscle spasms. 03/28/20   [name], MD  divalproex (DEPAKOTE) 250 MG DR tablet Take 250 mg by mouth. 1 tab AM, 2 tabs PM    [provider]  Insulin Degludec 200 UNIT/ML SOPN Inject 70 Units into the skin at bedtime.  06/01/18   [provider]  Lancets (ONETOUCH ULTRASOFT) lancets Check blood sugar no more than twice daily. 12/01/16   [name], MD  meloxicam  (MOBIC) 7.5 MG tablet Take 1 tablet (7.5 mg total) by mouth daily as needed for pain. 11/21/19   [name], MD  metFORMIN (GLUMETZA) 1000 MG (MOD) 24 hr tablet Take 1 tablet (1,000 mg total) by mouth 2 (two) times daily with a meal.    [name], MD  OneTouch Delica Lancets 33G MISC USE   TO CHECK GLUCOSE TWICE DAILY 10/17/18   [name], MD  [facility] VERIO test strip USE  STRIP TO CHECK GLUCOSE NO MORE THAN  TWICE DAILY 03/01/19   [name], MD  oxybutynin (DITROPAN-XL) 5 MG 24 hr tablet Take 5 mg by mouth 2 (two) times daily.     [provider]  propranolol ER (INDERAL LA) 120 MG 24 hr capsule Take 1 capsule (120 mg total) by mouth daily. 12/04/19   [name], MD  temazepam (RESTORIL) 15 MG capsule  12/11/17   [provider]  traMADol (ULTRAM) 50 MG tablet Take 1 tablet (50 mg total) by mouth every 8 (eight) hours as needed for up to 5 days. 04/29/20 05/04/20  Sandford Craze, NP    Allergies    Benztropine, Dairy aid [lactase], Lisinopril, Penicillins, and Monistat 3 combo pack app  [miconazole nitrate applicator]  Review of Systems   Review of Systems  Constitutional: Negative for fever.  Gastrointestinal: Positive for abdominal pain, constipation and diarrhea.  Genitourinary: Negative for dysuria, flank pain, frequency and hematuria.  All other systems reviewed and are negative.   Physical Exam Updated Vital Signs BP (!) 112/57 (BP Location: Left Arm)   Pulse 89   Temp 98.3 F (36.8 C) (Oral)   Resp 16   Ht 5\' 1"  (1.549 m)   Wt 69.4 kg   SpO2 100%   BMI 28.91 kg/m   Physical Exam Vitals and nursing note reviewed.  Constitutional:      General: She is not in acute distress.    Appearance: She is well-developed. She is not ill-appearing.  HENT:     Head: Normocephalic and atraumatic.  Eyes:     Conjunctiva/sclera: Conjunctivae normal.  Cardiovascular:     Rate and Rhythm: Normal rate and regular rhythm.  Pulmonary:     Effort: Pulmonary  effort is normal. No respiratory distress.     Breath sounds: Normal breath sounds.  Abdominal:     General: Bowel sounds are normal.     Palpations: Abdomen is soft.     Tenderness: There is abdominal tenderness in the left lower quadrant. There is no right CVA tenderness, left CVA tenderness, guarding or rebound.  Skin:    General: Skin is warm.  Neurological:     Mental Status: She is  alert.  Psychiatric:        Behavior: Behavior normal.     ED Results / Procedures / Treatments   Labs (all labs ordered are listed, but only abnormal results are displayed) Labs Reviewed  COMPREHENSIVE METABOLIC PANEL - Abnormal; Notable for the following components:      Result Value   Chloride 97 (*)    Glucose, Bld 110 (*)    BUN 26 (*)    Albumin 3.4 (*)    AST 65 (*)    ALT 53 (*)    Total Bilirubin 0.2 (*)    All other components within normal limits  URINALYSIS, ROUTINE W REFLEX MICROSCOPIC - Abnormal; Notable for the following components:   APPearance CLOUDY (*)    Glucose, UA 100 (*)    Protein, ur 100 (*)    Leukocytes,Ua TRACE (*)    All other components within normal limits  URINALYSIS, MICROSCOPIC (REFLEX) - Abnormal; Notable for the following components:   Bacteria, UA FEW (*)    All other components within normal limits  LIPASE, BLOOD  CBC    EKG None  Radiology CT Abdomen Pelvis W Contrast  Result Date: 04/29/2020 CLINICAL DATA:  Abdominal pain x 4 days. Pain in localized to her left lower quadrant HX HTN, DM EXAM: CT ABDOMEN AND PELVIS WITH CONTRAST TECHNIQUE: Multidetector CT imaging of the abdomen and pelvis was performed using the standard protocol following bolus administration of intravenous contrast. CONTRAST:  OMNIPAQUE IOHEXOL 300 MG/ML  SOLN COMPARISON:  Ultrasound abdomen 02/21/2009. FINDINGS: Lower chest: Suggestion of traction bronchiectasis and scarring within the left lower lobe. Hepatobiliary: The liver is enlarged measuring up to 20 cm. The  hepatic parenchyma is diffusely hypodense compared to the splenic parenchyma consistent with fatty infiltration. No focal liver abnormality. No gallstones, gallbladder wall thickening, or pericholecystic fluid. No biliary dilatation. Pancreas: Diffusely atrophic. Otherwise normal pancreatic contour. No surrounding inflammatory changes. No main pancreatic ductal dilatation. Spleen: Normal in size without focal abnormality. Adrenals/Urinary Tract: No adrenal nodule bilaterally. Bilateral kidneys enhance symmetrically. No hydronephrosis. No hydroureter. The urinary bladder is unremarkable. Stomach/Bowel: Stomach is within normal limits. The appendix is not definitely identified. No evidence of bowel wall thickening, distention, or inflammatory changes. Vascular/Lymphatic: No abdominal aorta or iliac aneurysm. Mild atherosclerotic plaque of the aorta and its branches. Prominent but nonenlarged retroperitoneal lymph nodes. No abdominal, pelvic, or inguinal lymphadenopathy. Reproductive: Status post hysterectomy. No adnexal masses. Other: No intraperitoneal free fluid. No intraperitoneal free gas. No organized fluid collection. Musculoskeletal: No abdominal wall hernia or abnormality Levoscoliosis of the thoracolumbar spine. No suspicious lytic or blastic osseous lesions. No acute displaced fracture. Multilevel degenerative changes of the spine. IMPRESSION: 1. Hepatomegaly and hepatic steatosis. 2. Otherwise no acute intra-abdominal or intrapelvic abnormality. No colonic diverticulosis. Electronically Signed   By: Tish Frederickson M.D.   On: 04/29/2020 18:11    Procedures Procedures (including critical care time)  Medications Ordered in ED Medications  iohexol (OMNIPAQUE) 300 MG/ML solution 100 mL (100 mLs Intravenous Contrast Given 04/29/20 1733)    ED Course  I have reviewed the triage vital signs and the nursing notes.  Pertinent labs & imaging results that were available during my care of the patient were  reviewed by me and considered in my medical decision making (see chart for details).    MDM Rules/Calculators/A&P                          Patient  presenting to the emergency department with complaint of intermittent left lower quadrant abdominal pain began 4 days ago.  She has some associated diarrhea, no fevers or chills, no nausea or vomiting.  No urinary symptoms.  On exam, she is well-appearing no distress.  Currently without pain on evaluation.  Abdomen is soft, tenderness in left lower quadrant, no peritoneal signs.  Given patient's location of pain and symptoms, will CT scan to evaluate for possible diverticulitis.  Labs are reassuring, no leukocytosis, very slight elevation in LFTs.  UA is negative for infection or blood.  Lipase within normal limits.  CT scan is negative.  Does show some fatty liver which may account for patient's very slight elevation in LFTs.  Do not believe this is associated.  Suspect symptoms may be from a mild enteritis.  Recommend continue to treat symptomatically, PCP follow-up, recheck LFTs..  Return precautions discussed.  She is well-appearing, agreeable plan, appropriate for discharge.   Final Clinical Impression(s) / ED Diagnoses Final diagnoses:  LLQ abdominal pain  Diarrhea, unspecified type    Rx / DC Orders ED Discharge Orders    None       Riordan Walle, [place] N, PA-C 04/29/20 1848    Terald Sleeperrifan, Matthew J, MD 04/30/20 [phone]

## 2020-05-01 LAB — URINALYSIS, ROUTINE W REFLEX MICROSCOPIC
Bilirubin Urine: NEGATIVE
Hgb urine dipstick: NEGATIVE
Hyaline Cast: NONE SEEN /LPF
Ketones, ur: NEGATIVE
Nitrite: NEGATIVE
RBC / HPF: NONE SEEN /HPF (ref 0–2)
Specific Gravity, Urine: 1.017 (ref 1.001–1.03)
pH: <=5 (ref 5.0–8.0)

## 2020-05-01 LAB — COMPREHENSIVE METABOLIC PANEL WITH GFR

## 2020-05-01 LAB — URINE CULTURE
MICRO NUMBER:: 11027286
Result:: NO GROWTH
SPECIMEN QUALITY:: ADEQUATE

## 2020-05-01 LAB — CBC WITH DIFFERENTIAL/PLATELET

## 2020-05-02 ENCOUNTER — Encounter: Payer: Self-pay | Admitting: Internal Medicine

## 2020-05-03 ENCOUNTER — Encounter: Payer: Self-pay | Admitting: Internal Medicine

## 2020-05-03 ENCOUNTER — Other Ambulatory Visit: Payer: Self-pay

## 2020-05-03 ENCOUNTER — Ambulatory Visit (INDEPENDENT_AMBULATORY_CARE_PROVIDER_SITE_OTHER): Payer: Medicare Other | Admitting: Internal Medicine

## 2020-05-03 VITALS — BP 131/79 | HR 93 | Temp 97.7°F | Resp 16 | Ht 61.0 in | Wt 154.4 lb

## 2020-05-03 DIAGNOSIS — R1032 Left lower quadrant pain: Secondary | ICD-10-CM | POA: Diagnosis not present

## 2020-05-03 DIAGNOSIS — K148 Other diseases of tongue: Secondary | ICD-10-CM | POA: Diagnosis not present

## 2020-05-03 DIAGNOSIS — I10 Essential (primary) hypertension: Secondary | ICD-10-CM | POA: Diagnosis not present

## 2020-05-03 NOTE — Progress Notes (Signed)
Pre visit review using our clinic review tool, if applicable. No additional management support is needed unless otherwise documented below in the visit note. 

## 2020-05-03 NOTE — Patient Instructions (Signed)
   GO TO THE FRONT DESK, PLEASE SCHEDULE YOUR APPOINTMENTS Come back for  A check up in 4 months  

## 2020-05-03 NOTE — Progress Notes (Signed)
Subjective:    Patient ID: Adrienne Bennett, female    DOB: [birth year], 60 y.o.   MRN: [id number]  DOS:  05/03/2020 Type of visit - description: rov Tongue lesion:  see last visit: S/p biopsy, benign Went to the ER 04/29/2020, note reviewed  Review of Systems Currently with no abdominal pain.  No fever chills No nausea or vomiting. Emotionally doing really well  Past Medical History:  Diagnosis Date  . 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 Depression    sess Dr.Plovsky  . High cholesterol   . Hypertension   . IBS (irritable bowel syndrome)    chronic Diarrhea  . Macular degeneration, dry   . Migraine    "long long time ago; maybe 20 yr ago" (09/01/2013)  . Osteoarthritis    "back; goes down my right leg" (09/01/2013)  . Type II diabetes mellitus (HCC) 2007    Past Surgical History:  Procedure Laterality Date  . CARPAL TUNNEL RELEASE Bilateral ~ 2000  . TONSILLECTOMY  1970  . TUBAL LIGATION  1996    Allergies as of 05/03/2020      Reactions   Benztropine    Dairy Aid [lactase]    Lisinopril    Dc 06-2015, had lip swelling, stomatitis   Penicillins    Monistat 3 Combo Pack App  [miconazole Nitrate Applicator] Itching, Rash      Medication List       Accurate as of May 03, 2020 11:59 PM. If you have any questions, ask your nurse or doctor.        STOP taking these medications   Ecotrin 325 MG EC tablet Generic drug: aspirin Stopped by: Willow Ora, MD     TAKE these medications   amLODipine 10 MG tablet Commonly known as: NORVASC Take 1 tablet (10 mg total) by mouth daily.   atorvastatin 20 MG tablet Commonly known as: LIPITOR Take 1 tablet (20 mg total) by mouth daily.   clonazePAM 1 MG tablet Commonly known as: KLONOPIN Take by mouth. 1 tab AM, 1/2 tab PM, rx by psychiatry   cyclobenzaprine 10 MG tablet Commonly known as:  FLEXERIL Take 1 tablet (10 mg total) by mouth 2 (two) times daily as needed for muscle spasms.   Depakote 250 MG DR tablet Generic drug: divalproex Take 250 mg by mouth. 1 tab AM, 2 tabs PM   insulin degludec 200 UNIT/ML FlexTouch Pen Commonly known as: TRESIBA Inject 100 Units into the skin at bedtime. If blood sugar consistently more than 200, increase to 120 units   meloxicam 7.5 MG tablet Commonly known as: MOBIC Take 1 tablet (7.5 mg total) by mouth daily as needed for pain.   metFORMIN 1000 MG (MOD) 24 hr tablet Commonly known as: GLUMETZA Take 1 tablet (1,000 mg total) by mouth 2 (two) times daily with a meal.   onetouch ultrasoft lancets Check blood sugar no more than twice daily.   OneTouch Delica Lancets 33G Misc USE   TO CHECK GLUCOSE TWICE DAILY   OneTouch Verio test strip Generic drug: glucose blood USE  STRIP TO CHECK GLUCOSE NO MORE THAN  TWICE DAILY   oxybutynin 5 MG 24 hr tablet Commonly known as: DITROPAN-XL Take 5 mg by mouth 2 (two) times daily.   propranolol ER 120 MG 24 hr capsule Commonly known as: Inderal LA Take 1 capsule (120 mg total) by mouth daily.  temazepam 15 MG capsule Commonly known as: RESTORIL   traMADol 50 MG tablet Commonly known as: ULTRAM Take 1 tablet (50 mg total) by mouth every 8 (eight) hours as needed for up to 5 days.          Objective:   Physical Exam BP 131/79 (BP Location: Left Arm, Patient Position: Sitting, Cuff Size: Small)   Pulse 93   Temp 97.7 F (36.5 C) (Oral)   Resp 16   Ht 5\' 1"  (1.549 m)   Wt 154 lb 6 oz (70 kg)   SpO2 97%   BMI 29.17 kg/m   General:   Well developed, NAD, BMI noted. HEENT:  Normocephalic . Face symmetric, atraumatic Lungs:  CTA B Normal respiratory effort, no intercostal retractions, no accessory muscle use. Heart: RRR,  no murmur.  Lower extremities: no pretibial edema bilaterally  Skin: Not pale. Not jaundice Neurologic:  alert & oriented X3.  Speech normal, gait  appropriate for age and unassisted Psych--  Cognition and judgment appear intact.  Cooperative with normal attention span and concentration.  Behavior appropriate. No anxious or depressed appearing.      Assessment       Assessment ENDO: started to see Dr 12/2017 DM  HTN -- dc lisinopril 06-2015>> lips swell x1 , also had stomatitis >> resolved  High cholesterol Fatty Liver per CT 04-2020 Thyroid disease -Multinodular goiter, thyromegaly w/ dominant nodule: Negative BX 01-2015 - Hyperthyroidism, subclinical, DX 2019 --s/p thyroid ablation per patient 2019 ? IBS.Chronic diarrhea DJD Psychiatry: Sees Dr. 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 LLQ abdominal pain: Went to the ER 04/29/2020, AST 65 ALT 53 lipase normal, CBC normal; CT was nonacute, + fatty liver. Increase LFTs: Does not take Tylenol, had LLQ abdominal pain but never RUQ abdominal pain.  Recheck LFTs on return to the office. Tongue  lesion: See last visit, saw ENT, pathology benign, no cancer. ASA: takes prn pain, not qd b/c is  afraid bleeding, pro-cons of qd ASA d/w pt , elected no ASA Preventive care: Had COVID vaccine x2, booster recommended.  Had a flu shot already. Depression, bipolar: Currently doing well, less stres d/t  family helping take care of her 51 year old mother. RTC 4 months    This visit occurred during the SARS-CoV-2 public health emergency.  Safety protocols were in place, including screening questions prior to the visit, additional usage of staff PPE, and extensive cleaning of exam room while observing appropriate contact time as indicated for disinfecting solutions.

## 2020-05-04 NOTE — Assessment & Plan Note (Signed)
DM: Not well controlled, follow-up with Endo HTN: Last BMP satisfactory, continue amlodipine, propanolol. LLQ abdominal pain: Went to the ER 04/29/2020, AST 65 ALT 53 lipase normal, CBC normal; CT was nonacute, + fatty liver. Increase LFTs: Does not take Tylenol, had LLQ abdominal pain but never RUQ abdominal pain.  Recheck LFTs on return to the office. Tongue  lesion: See last visit, saw ENT, pathology benign, no cancer. ASA: takes prn pain, not qd b/c is  afraid bleeding, pro-cons of qd ASA d/w pt , elected no ASA Preventive care: Had COVID vaccine x2, booster recommended.  Had a flu shot already. Depression, bipolar: Currently doing well, less stres d/t  family helping take care of her 74 year old mother. RTC 4 months

## 2020-05-14 ENCOUNTER — Other Ambulatory Visit: Payer: Self-pay | Admitting: Internal Medicine

## 2020-05-30 ENCOUNTER — Other Ambulatory Visit: Payer: Self-pay | Admitting: Internal Medicine

## 2020-05-31 ENCOUNTER — Ambulatory Visit (INDEPENDENT_AMBULATORY_CARE_PROVIDER_SITE_OTHER): Payer: Medicare Other | Admitting: Internal Medicine

## 2020-05-31 ENCOUNTER — Encounter: Payer: Self-pay | Admitting: Internal Medicine

## 2020-05-31 ENCOUNTER — Other Ambulatory Visit: Payer: Self-pay

## 2020-05-31 VITALS — BP 164/84 | HR 97 | Temp 97.9°F | Resp 18 | Ht 61.0 in | Wt 153.5 lb

## 2020-05-31 DIAGNOSIS — M79604 Pain in right leg: Secondary | ICD-10-CM | POA: Diagnosis not present

## 2020-05-31 NOTE — Progress Notes (Signed)
Pre visit review using our clinic review tool, if applicable. No additional management support is needed unless otherwise documented below in the visit note. 

## 2020-05-31 NOTE — Progress Notes (Signed)
Subjective:    Patient ID: Adrienne Bennett, female    DOB: [birth year], 60 y.o.   MRN: [id number]  DOS:  05/31/2020 Type of visit - description: Acute One week history of pain: Location: Hurts from the right knee down. The pain is steady, not necessarily worse when she walks,it did get worse when she was pressing the gas pedal while driving. It feels "cold", not burning She also has noticed swelling on and off, worse on the right than on the left.  She is very concerned about the swelling, "my sister swell up before she died". no weight gain noted.  Denies fever chills No chest pain no difficulty breathing No back pain per se. No injury No change in the color of the lower extremity.  Wt Readings from Last 3 Encounters:  05/31/20 153 lb 8 oz (69.6 kg)  05/03/20 154 lb 6 oz (70 kg)  04/29/20 153 lb (69.4 kg)   BP Readings from Last 3 Encounters:  05/31/20 (!) 164/84  05/03/20 131/79  04/29/20 (!) 155/74     Review of Systems See above   Past Medical History:  Diagnosis Date  . 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 Depression    sess Dr.Plovsky  . High cholesterol   . Hypertension   . IBS (irritable bowel syndrome)    chronic Diarrhea  . Macular degeneration, dry   . Migraine    "long long time ago; maybe 20 yr ago" (09/01/2013)  . Osteoarthritis    "back; goes down my right leg" (09/01/2013)  . Type II diabetes mellitus (HCC) 2007    Past Surgical History:  Procedure Laterality Date  . CARPAL TUNNEL RELEASE Bilateral ~ 2000  . TONSILLECTOMY  1970  . TUBAL LIGATION  1996    Allergies as of 05/31/2020      Reactions   Benztropine    Dairy Aid [lactase]    Lisinopril    Dc 06-2015, had lip swelling, stomatitis   Penicillins    Monistat 3 Combo Pack App  [miconazole Nitrate Applicator] Itching, Rash      Medication List       Accurate as  of May 31, 2020 11:59 PM. If you have any questions, ask your nurse or doctor.        amLODipine 10 MG tablet Commonly known as: NORVASC Take 1 tablet (10 mg total) by mouth daily.   atorvastatin 20 MG tablet Commonly known as: LIPITOR Take 1 tablet (20 mg total) by mouth daily.   clonazePAM 1 MG tablet Commonly known as: KLONOPIN Take by mouth. 1 tab AM, 1/2 tab PM, rx by psychiatry   cyclobenzaprine 10 MG tablet Commonly known as: FLEXERIL Take 1 tablet (10 mg total) by mouth 2 (two) times daily as needed for muscle spasms.   Depakote 250 MG DR tablet Generic drug: divalproex Take 250 mg by mouth. 1 tab AM, 2 tabs PM   insulin degludec 200 UNIT/ML FlexTouch Pen Commonly known as: TRESIBA Inject 100 Units into the skin at bedtime. If blood sugar consistently more than 200, increase to 120 units   meloxicam 7.5 MG tablet Commonly known as: MOBIC Take 1 tablet (7.5 mg total) by mouth daily as needed for pain.   metFORMIN 1000 MG (MOD) 24 hr tablet Commonly known as: GLUMETZA Take 1 tablet (1,000 mg total) by mouth 2 (two) times daily with a meal.  onetouch ultrasoft lancets Check blood sugar no more than twice daily.   OneTouch Delica Lancets 33G Misc USE   TO CHECK GLUCOSE TWICE DAILY   OneTouch Verio test strip Generic drug: glucose blood Check blood sugars up to 4 times daily as directed by endocrinology   oxybutynin 5 MG 24 hr tablet Commonly known as: DITROPAN-XL Take 5 mg by mouth 2 (two) times daily.   propranolol ER 120 MG 24 hr capsule Commonly known as: Inderal LA Take 1 capsule (120 mg total) by mouth daily.   temazepam 15 MG capsule Commonly known as: RESTORIL          Objective:   Physical Exam BP (!) 164/84 (BP Location: Right Arm, Patient Position: Sitting, Cuff Size: Small)   Pulse 97   Temp 97.9 F (36.6 C) (Oral)   Resp 18   Ht 5\' 1"  (1.549 m)   Wt 153 lb 8 oz (69.6 kg)   SpO2 95%   BMI 29.00 kg/m  General:   Well  developed, NAD, BMI noted. HEENT:  Normocephalic . Face symmetric, atraumatic Lungs:  CTA B Normal respiratory effort, no intercostal retractions, no accessory muscle use. Heart: RRR,  no murmur.  Lower extremities: Bilateral femoral and pedal pulses normal. Both extremities are warm, symmetric, no TTP. Knees, ankles: No puffiness. Skin: Not pale. Not jaundice Neurologic:  alert & oriented X3.  Speech normal, gait appropriate for age and unassisted Strength normal, DTRs: Decrease right ankle jerk otherwise symmetric. Psych--  Cognition and judgment appear intact.  Cooperative with normal attention span and concentration.  Behavior appropriate. No anxious or depressed appearing.      Assessment    Assessment ENDO: started to see Dr 12/2017 DM  HTN -- dc lisinopril 06-2015>> lips swell x1 , also had stomatitis >> resolved  High cholesterol Fatty Liver per CT 04-2020 Thyroid disease -Multinodular goiter, thyromegaly w/ dominant nodule: Negative BX 01-2015 - Hyperthyroidism, subclinical, DX 2019 --s/p thyroid ablation per patient 2019 ? IBS.Chronic diarrhea DJD Psychiatry: Sees Dr. 2020 Depression, bipolar, PTSD, agoraphobiam ,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),  recovered self-mutilator  PLAN: Right leg pain: Started a week ago, from the knee down.  On exam, no evidence of vascular disease or arthropathy. Etiology unclear, neuropathic pain?  No typical symptoms for radiculopathy. For now we agreed on observation, if the pain does not self resolve soon or if it gets worse she will let me know.  We will consider blood work, neurology referral?. She is concerned about edema, no edema or weight gain noted today, she is reassured. HTN: BP is slightly elevated, usually okay, recheck on RTC Otherwise RTC as recommended by 08-2020  This visit occurred during the SARS-CoV-2 public health emergency.  Safety protocols were in place,  including screening questions prior to the visit, additional usage of staff PPE, and extensive cleaning of exam room while observing appropriate contact time as indicated for disinfecting solutions.

## 2020-05-31 NOTE — Patient Instructions (Addendum)
You are due for a mammogram and pap smear. Please contact Dr. Renaldo Fiddler to schedule that at your earliest convenience.   Call if no better in few days, call id pain is severe

## 2020-06-01 NOTE — Assessment & Plan Note (Signed)
Right leg pain: Started a week ago, from the knee down.  On exam, no evidence of vascular disease or arthropathy. Etiology unclear, neuropathic pain?  No typical symptoms for radiculopathy. For now we agreed on observation, if the pain does not self resolve soon or if it gets worse she will let me know.  We will consider blood work, neurology referral?. She is concerned about edema, no edema or weight gain noted today, she is reassured. HTN: BP is slightly elevated, usually okay, recheck on RTC Otherwise RTC as recommended by 08-2020

## 2020-06-03 ENCOUNTER — Ambulatory Visit: Payer: Medicare Other | Admitting: Internal Medicine

## 2020-07-15 DIAGNOSIS — E042 Nontoxic multinodular goiter: Secondary | ICD-10-CM | POA: Diagnosis not present

## 2020-07-15 DIAGNOSIS — Z794 Long term (current) use of insulin: Secondary | ICD-10-CM | POA: Diagnosis not present

## 2020-07-15 DIAGNOSIS — E1165 Type 2 diabetes mellitus with hyperglycemia: Secondary | ICD-10-CM | POA: Diagnosis not present

## 2020-07-15 DIAGNOSIS — E11319 Type 2 diabetes mellitus with unspecified diabetic retinopathy without macular edema: Secondary | ICD-10-CM | POA: Diagnosis not present

## 2020-07-15 LAB — HEMOGLOBIN A1C: Hemoglobin A1C: 10.7

## 2020-07-31 ENCOUNTER — Other Ambulatory Visit: Payer: Self-pay | Admitting: Internal Medicine

## 2020-08-07 DIAGNOSIS — H353131 Nonexudative age-related macular degeneration, bilateral, early dry stage: Secondary | ICD-10-CM | POA: Diagnosis not present

## 2020-08-28 DIAGNOSIS — Z20822 Contact with and (suspected) exposure to covid-19: Secondary | ICD-10-CM | POA: Diagnosis not present

## 2020-09-03 ENCOUNTER — Ambulatory Visit: Payer: Medicare Other | Admitting: Internal Medicine

## 2020-09-14 ENCOUNTER — Other Ambulatory Visit: Payer: Self-pay | Admitting: Internal Medicine

## 2020-10-01 ENCOUNTER — Encounter: Payer: Self-pay | Admitting: Internal Medicine

## 2020-10-02 ENCOUNTER — Encounter: Payer: Self-pay | Admitting: Internal Medicine

## 2020-10-02 DIAGNOSIS — H02834 Dermatochalasis of left upper eyelid: Secondary | ICD-10-CM | POA: Diagnosis not present

## 2020-10-02 DIAGNOSIS — H527 Unspecified disorder of refraction: Secondary | ICD-10-CM | POA: Diagnosis not present

## 2020-10-02 DIAGNOSIS — H02831 Dermatochalasis of right upper eyelid: Secondary | ICD-10-CM | POA: Diagnosis not present

## 2020-10-02 DIAGNOSIS — H25813 Combined forms of age-related cataract, bilateral: Secondary | ICD-10-CM | POA: Diagnosis not present

## 2020-10-02 DIAGNOSIS — H52203 Unspecified astigmatism, bilateral: Secondary | ICD-10-CM | POA: Diagnosis not present

## 2020-10-02 DIAGNOSIS — E103292 Type 1 diabetes mellitus with mild nonproliferative diabetic retinopathy without macular edema, left eye: Secondary | ICD-10-CM | POA: Diagnosis not present

## 2020-10-02 NOTE — Telephone Encounter (Signed)
Spoke with patient regarding symptoms.  Patient reports chest pressure 2/week x 1 month when grocery shopping.  States she does not notice the discomfort at any other time of the week.  Denies SOB, diaphoresis, arm pain, jaw pain, or chest pain.  Patient states she does have increased stress at home.   Patient unavailable for appointment today, scheduled with PCP on 10/03/2020.   Advised if symptoms present, to go to closest ER or call EMS. Patient verbalized understanding.

## 2020-10-02 NOTE — Telephone Encounter (Signed)
Called patient and lvm to call back for appointment  

## 2020-10-03 ENCOUNTER — Ambulatory Visit (INDEPENDENT_AMBULATORY_CARE_PROVIDER_SITE_OTHER): Payer: Medicare Other | Admitting: Internal Medicine

## 2020-10-03 ENCOUNTER — Ambulatory Visit (HOSPITAL_BASED_OUTPATIENT_CLINIC_OR_DEPARTMENT_OTHER)
Admission: RE | Admit: 2020-10-03 | Discharge: 2020-10-03 | Disposition: A | Discharge status: Home / Self Care | Payer: Medicare Other | Source: Ambulatory Visit | Attending: Internal Medicine | Admitting: Internal Medicine

## 2020-10-03 ENCOUNTER — Ambulatory Visit: Payer: Medicare Other | Admitting: Internal Medicine

## 2020-10-03 ENCOUNTER — Other Ambulatory Visit: Payer: Self-pay

## 2020-10-03 VITALS — BP 130/80 | HR 86 | Temp 98.1°F | Ht 61.0 in | Wt 158.0 lb

## 2020-10-03 DIAGNOSIS — R079 Chest pain, unspecified: Secondary | ICD-10-CM | POA: Diagnosis not present

## 2020-10-03 DIAGNOSIS — F419 Anxiety disorder, unspecified: Secondary | ICD-10-CM | POA: Diagnosis not present

## 2020-10-03 DIAGNOSIS — I517 Cardiomegaly: Secondary | ICD-10-CM | POA: Diagnosis not present

## 2020-10-03 NOTE — Progress Notes (Signed)
Subjective:    Patient ID: Adrienne Bennett, female    DOB: 09/23/1960, 60 y.o.   MRN: [id number]  DOS:  10/03/2020 Type of visit - description: Acute:  Here for chest tightness, see patient's message Approximately 3 to 4 weeks ago she developed TWO type of chest pains:  Stabbing, right-sided, rib cage pain, twice a week, last few minutes, it "paralyzes her".  Symptoms are not postprandial.  No associated nausea, vomiting.  No cough.   A pressure type of pain located at both sides of the rib cage that "constrains my breathing". There is no associated  nausea, diaphoresis.  Happens 2 or 3 times a week.  None of the symptoms are exertional.  She thinks symptoms are related to stress.  Review of Systems Denies cough. No leg pain.  No calf swelling.  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),  recovered self-mutilator   Depression    sess Dr.Plovsky   High cholesterol    Hypertension    IBS (irritable bowel syndrome)    chronic Diarrhea   Macular degeneration, dry    Migraine    "long long time ago; maybe 20 yr ago" (09/01/2013)   Osteoarthritis    "back; goes down my right leg" (09/01/2013)   Type II diabetes mellitus (HCC) 2007    Past Surgical History:  Procedure Laterality Date   CARPAL TUNNEL RELEASE Bilateral ~ 2000   TONSILLECTOMY  1970   TUBAL LIGATION  1996    Allergies as of 10/03/2020      Reactions   Benztropine    Dairy Aid [lactase]    Lisinopril    Dc 06-2015, had lip swelling, stomatitis   Penicillins    Monistat 3 Combo Pack App  [miconazole Nitrate Applicator] Itching, Rash      Medication List       Accurate as of October 03, 2020 11:59 PM. If you have any questions, ask your nurse or doctor.        amLODipine 10 MG tablet Commonly known as: NORVASC Take 1 tablet  (10 mg total) by mouth daily.   atorvastatin 20 MG tablet Commonly known as: LIPITOR Take 1 tablet (20 mg total) by mouth daily.   clonazePAM 1 MG tablet Commonly known as: KLONOPIN Take by mouth. 1 tab AM, 1/2 tab PM, rx by psychiatry   cyclobenzaprine 10 MG tablet Commonly known as: FLEXERIL Take 1 tablet (10 mg total) by mouth 2 (two) times daily as needed for muscle spasms.   divalproex 250 MG DR tablet Commonly known as: DEPAKOTE Take 250 mg by mouth. 1 tab AM, 2 tabs PM   insulin degludec 200 UNIT/ML FlexTouch Pen Commonly known as: TRESIBA Inject 100 Units into the skin at bedtime. If blood sugar consistently more than 200, increase to 120 units   meloxicam 7.5 MG tablet Commonly known as: MOBIC Take 1 tablet (7.5 mg total) by mouth daily as needed for pain.   metFORMIN 1000 MG (MOD) 24 hr tablet Commonly known as: GLUMETZA Take 1 tablet (1,000 mg total) by mouth 2 (two) times daily with a meal.   onetouch ultrasoft lancets Check blood sugar no more than twice daily.   OneTouch Delica Lancets 33G Misc USE   TO CHECK GLUCOSE TWICE DAILY   OneTouch Verio test strip Generic drug: glucose  blood Check blood sugars up to 4 times daily as directed by endocrinology   oxybutynin 5 MG 24 hr tablet Commonly known as: DITROPAN-XL Take 5 mg by mouth 2 (two) times daily.   propranolol ER 120 MG 24 hr capsule Commonly known as: Inderal LA Take 1 capsule (120 mg total) by mouth daily.   temazepam 15 MG capsule Commonly known as: RESTORIL          Objective:   Physical Exam BP 130/80 (BP Location: Left Arm, Patient Position: Sitting, Cuff Size: Large)    Pulse 86    Temp 98.1 F (36.7 C) (Oral)    Ht 5\' 1"  (1.549 m)    Wt 158 lb (71.7 kg)    SpO2 95%    BMI 29.85 kg/m  General:   Well developed, NAD, BMI noted.  HEENT:  Normocephalic . Face symmetric, atraumatic Lungs:  CTA B Normal respiratory effort, no intercostal retractions, no accessory muscle  use. Heart: RRR,  no murmur.  Abdomen:  Not distended, soft, non-tender. No rebound or rigidity.   Skin: Not pale. Not jaundice Lower extremities: Overdiagnosis sock mark no edema.  Calves soft and symmetric Neurologic:  alert & oriented X3.  Speech normal, gait appropriate for age and unassisted Psych--  Cognition and judgment appear intact.  Cooperative with normal attention span and concentration.  Behavior appropriate. Slightly anxious but not  depressed appearing.     Assessment      Assessment ENDO: started to see Dr 12/2017 DM  HTN -- dc lisinopril 06-2015>> lips swell x1 , also had stomatitis >> resolved  High cholesterol Fatty Liver per CT 04-2020 Thyroid disease -Multinodular goiter, thyromegaly w/ dominant nodule: Negative BX 01-2015 - Hyperthyroidism, subclinical, DX 2019 --s/p thyroid ablation per patient 2019 ? IBS.Chronic diarrhea DJD Psychiatry: Sees Dr. 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 For completes: EKG: NSR, no acute changes, no change from previous Chest x-ray Observation, if symptoms increase in frequency and intensity or they are associated with nausea, diaphoresis: Call or go to the ER. Depression, bipolar, PTSD: Reports she is under a lot of stress, she take shifts with her siblings to take care of her mother, "too much is expected of me". Listening therapy provided. Formal counseling?  Unable to pursue, no time.  Recommend to discuss with Dr. Donell Beers, adjust meds?.   This visit occurred during the SARS-CoV-2 public health emergency.  Safety protocols were in place, including screening questions prior to the visit, additional usage of staff PPE, and extensive cleaning of exam room while observing  appropriate contact time as indicated for disinfecting solutions.

## 2020-10-03 NOTE — Patient Instructions (Addendum)
Per our records you are due for an eye exam. Please contact your eye doctor to schedule an appointment. Please have them send copies of your office visit notes to [place]. Our fax number is [phone].   Get chest x-ray at the first floor  Call if not gradually better  Call or ER if severe symptoms  Please discuss distress with Dr. Donell Beers

## 2020-10-04 NOTE — Assessment & Plan Note (Signed)
Chest pain: Two type of chest pains as described above, symptoms are atypical, low suspicious for a coronary syndrome or PE. For completes: EKG: NSR, no acute changes, no change from previous Chest x-ray Observation, if symptoms increase in frequency and intensity or they are associated with nausea, diaphoresis: Call or go to the ER. Depression, bipolar, PTSD: Reports she is under a lot of stress, she take shifts with her siblings to take care of her mother, "too much is expected of me". Listening therapy provided. Formal counseling?  Unable to pursue, no time.  Recommend to discuss with Dr. Donell Beers, adjust meds?Marland Kitchen

## 2020-10-08 DIAGNOSIS — E11319 Type 2 diabetes mellitus with unspecified diabetic retinopathy without macular edema: Secondary | ICD-10-CM | POA: Diagnosis not present

## 2020-10-08 DIAGNOSIS — E042 Nontoxic multinodular goiter: Secondary | ICD-10-CM | POA: Diagnosis not present

## 2020-10-08 DIAGNOSIS — Z794 Long term (current) use of insulin: Secondary | ICD-10-CM | POA: Diagnosis not present

## 2020-10-08 DIAGNOSIS — E1165 Type 2 diabetes mellitus with hyperglycemia: Secondary | ICD-10-CM | POA: Diagnosis not present

## 2020-10-15 ENCOUNTER — Encounter: Payer: Self-pay | Admitting: Internal Medicine

## 2020-11-04 IMAGING — CT CT ABD-PELV W/ CM
2 of 5 series · 16 of 46 positions shown, 18 images · IV contrast (Omnipaque)
Comparison: Ultrasound abdomen 02/21/2009.

CLINICAL DATA: Abdominal pain x 4 days. Pain in localized to her
left lower quadrant HX HTN, DM

EXAM:
CT ABDOMEN AND PELVIS WITH CONTRAST
TECHNIQUE: Multidetector CT imaging of the abdomen and pelvis was performed
using the standard protocol following bolus administration of
intravenous contrast.
CONTRAST:  100mL OMNIPAQUE IOHEXOL 300 MG/ML  SOLN

[Series 2: axial st · axial · 0.74mm/px · z∈[-604,-219]mm · 13 of 87 slices shown, 15 images]
[im 5/87  soft-tissue]
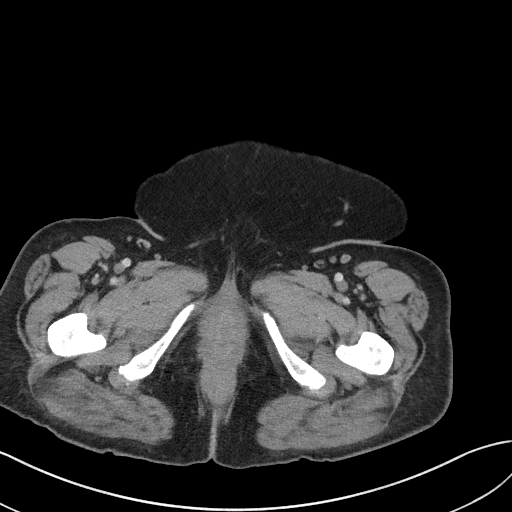
[im 5/87  bone]
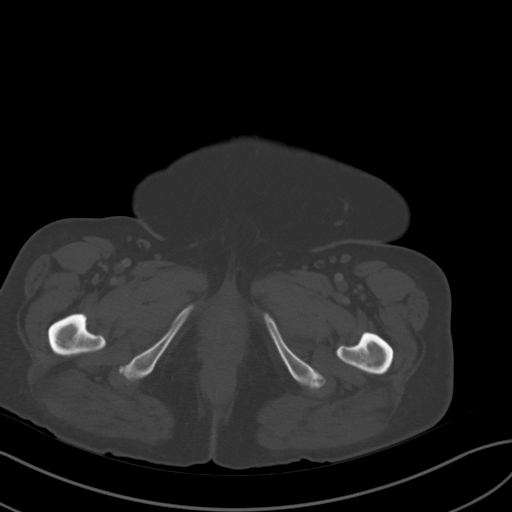
[im 10/87  soft-tissue]
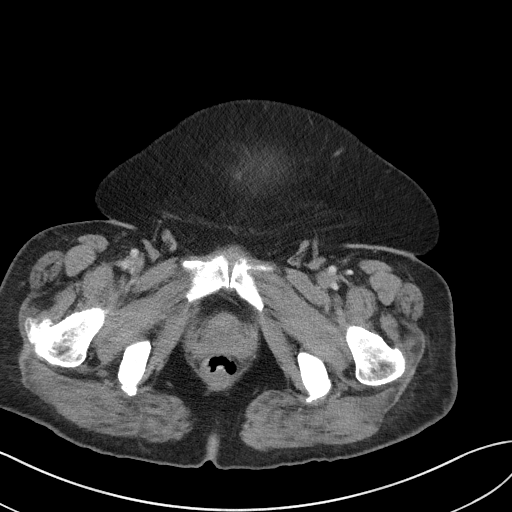
[im 20/87  soft-tissue]
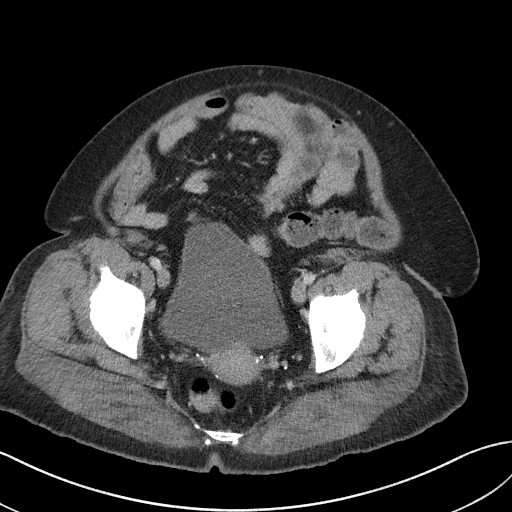
[im 24/87  soft-tissue]
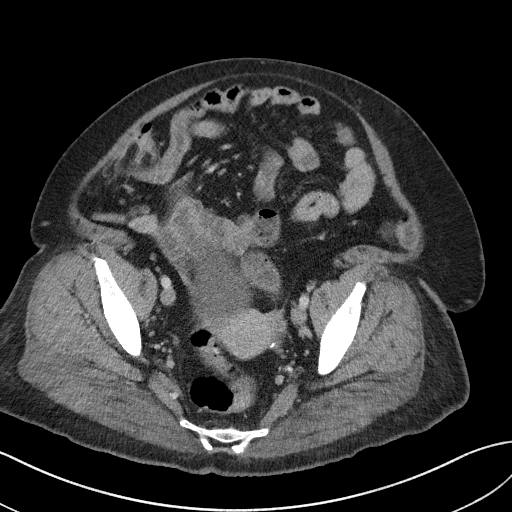
[im 29/87  soft-tissue]
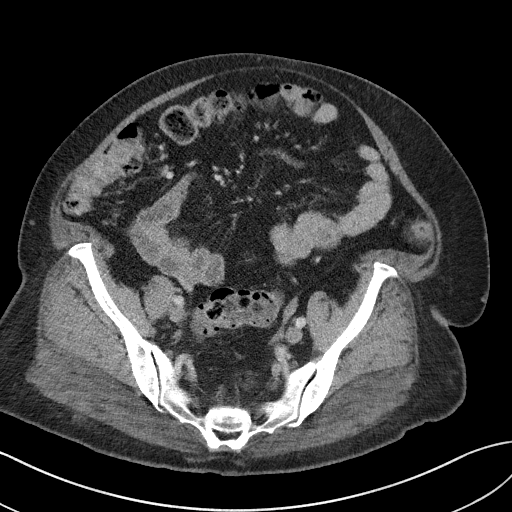
[im 39/87  soft-tissue]
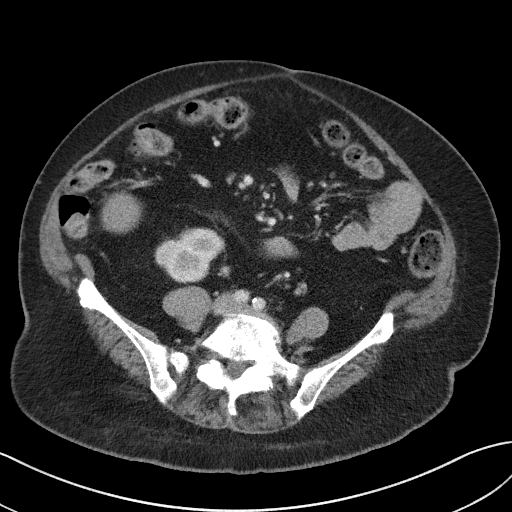
[im 44/87  soft-tissue]
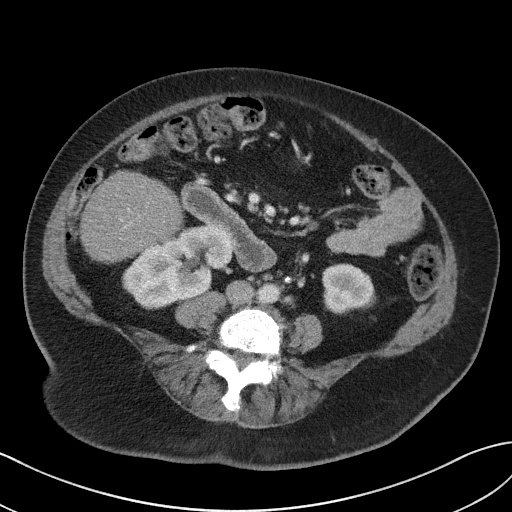
[im 48/87  soft-tissue]
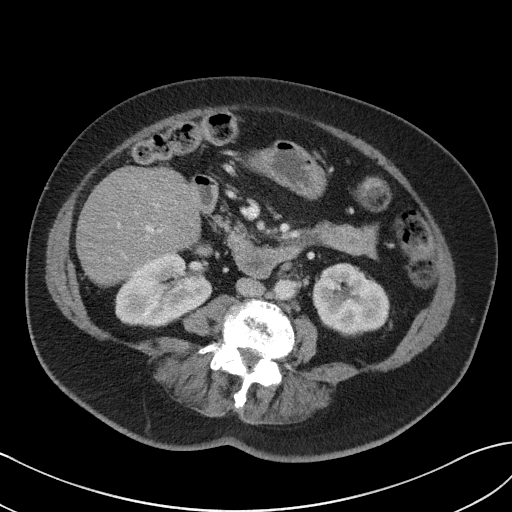
[im 58/87  soft-tissue]
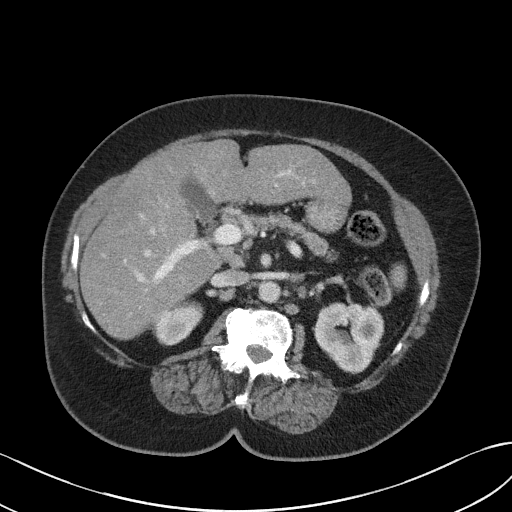
[im 58/87  bone]
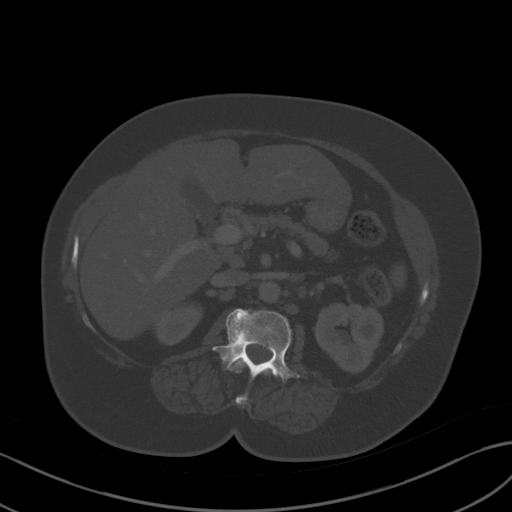
[im 63/87  soft-tissue]
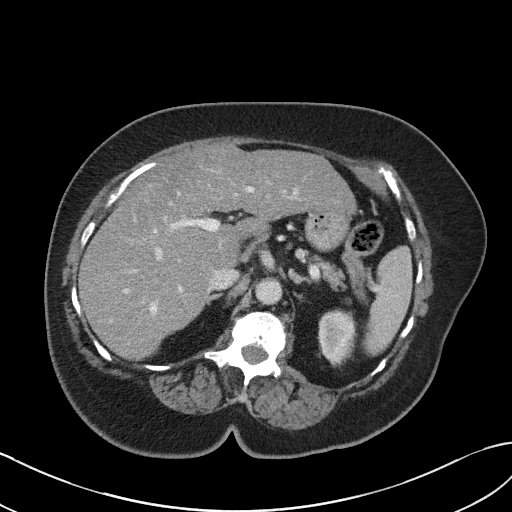
[im 67/87  soft-tissue]
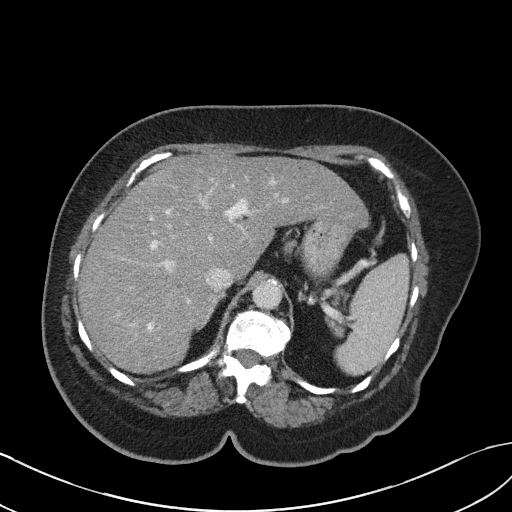
[im 77/87  soft-tissue]
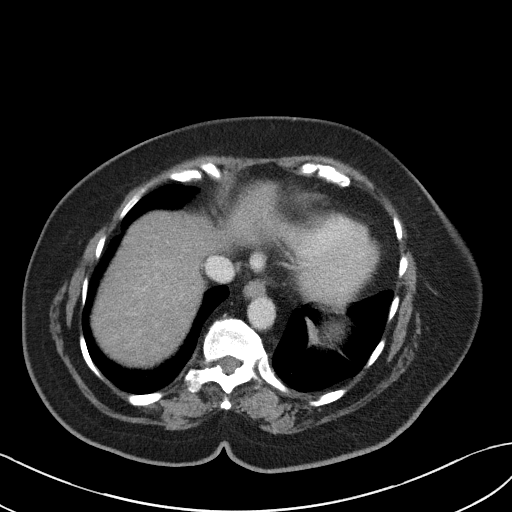
[im 82/87  soft-tissue]
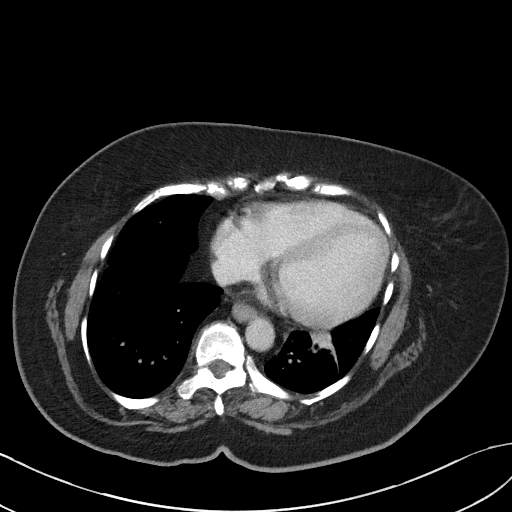

[Series 5: coronal st · coronal · 0.73mm/px · 3 of 107 slices shown]
[im 36/107  soft-tissue]
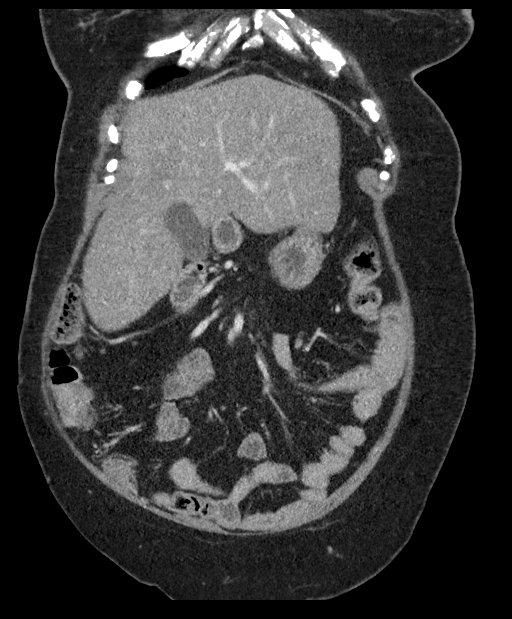
[im 48/107  soft-tissue]
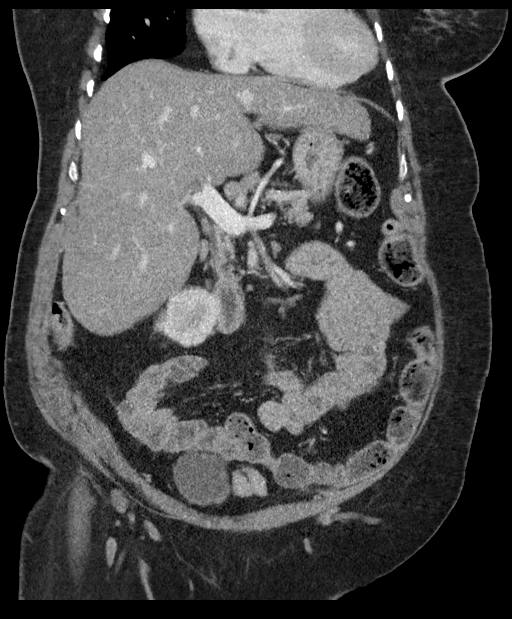
[im 59/107  soft-tissue]
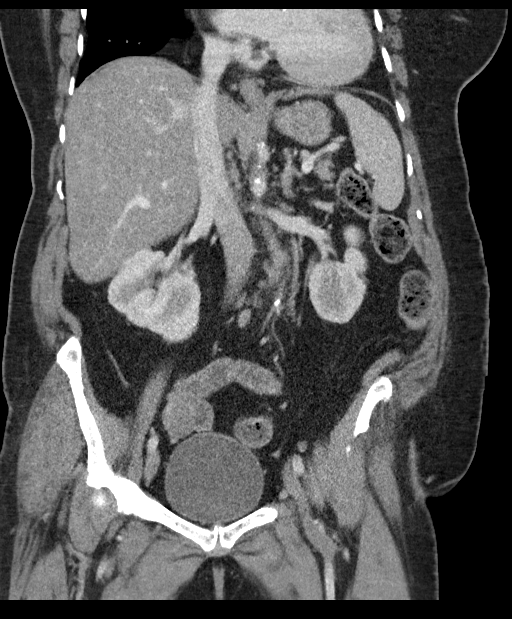

[16 of 46 positions shown; findings below may reference images not displayed]

FINDINGS: Lower chest: Suggestion of traction bronchiectasis and scarring
within the left lower lobe.

Hepatobiliary: The liver is enlarged measuring up to 20 cm. The
hepatic parenchyma is diffusely hypodense compared to the splenic
parenchyma consistent with fatty infiltration. No focal liver
abnormality. No gallstones, gallbladder wall thickening, or
pericholecystic fluid. No biliary dilatation.

Pancreas: Diffusely atrophic. Otherwise normal pancreatic contour.
No surrounding inflammatory changes. No main pancreatic ductal
dilatation.

Spleen: Normal in size without focal abnormality.

Adrenals/Urinary Tract: No adrenal nodule bilaterally. Bilateral
kidneys enhance symmetrically. No hydronephrosis. No hydroureter.
The urinary bladder is unremarkable.

Stomach/Bowel: Stomach is within normal limits. The appendix is not
definitely identified. No evidence of bowel wall thickening,
distention, or inflammatory changes.

Vascular/Lymphatic: No abdominal aorta or iliac aneurysm. Mild
atherosclerotic plaque of the aorta and its branches. Prominent but
nonenlarged retroperitoneal lymph nodes. No abdominal, pelvic, or
inguinal lymphadenopathy.

Reproductive: Status post hysterectomy. No adnexal masses.

Other: No intraperitoneal free fluid. No intraperitoneal free gas.
No organized fluid collection.

Musculoskeletal:

No abdominal wall hernia or abnormality

Levoscoliosis of the thoracolumbar spine. No suspicious lytic or
blastic osseous lesions. No acute displaced fracture. Multilevel
degenerative changes of the spine.
IMPRESSION: 1. Hepatomegaly and hepatic steatosis.
2. Otherwise no acute intra-abdominal or intrapelvic abnormality. No
colonic diverticulosis.

## 2020-11-06 ENCOUNTER — Encounter: Payer: Self-pay | Admitting: Internal Medicine

## 2020-11-07 DIAGNOSIS — H52203 Unspecified astigmatism, bilateral: Secondary | ICD-10-CM | POA: Diagnosis not present

## 2020-11-07 DIAGNOSIS — H25813 Combined forms of age-related cataract, bilateral: Secondary | ICD-10-CM | POA: Diagnosis not present

## 2020-11-09 ENCOUNTER — Other Ambulatory Visit: Payer: Self-pay | Admitting: Internal Medicine

## 2020-11-11 MED ORDER — PROPRANOLOL HCL ER 120 MG PO CP24: 120.0000 mg | ORAL_CAPSULE | Freq: Every day | ORAL | 0 refills | Status: DC

## 2020-11-14 DIAGNOSIS — E1136 Type 2 diabetes mellitus with diabetic cataract: Secondary | ICD-10-CM | POA: Diagnosis not present

## 2020-11-14 DIAGNOSIS — I10 Essential (primary) hypertension: Secondary | ICD-10-CM | POA: Diagnosis not present

## 2020-11-14 DIAGNOSIS — H25041 Posterior subcapsular polar age-related cataract, right eye: Secondary | ICD-10-CM | POA: Diagnosis not present

## 2020-11-14 DIAGNOSIS — Z833 Family history of diabetes mellitus: Secondary | ICD-10-CM | POA: Diagnosis not present

## 2020-11-14 DIAGNOSIS — Z79899 Other long term (current) drug therapy: Secondary | ICD-10-CM | POA: Diagnosis not present

## 2020-11-14 DIAGNOSIS — E785 Hyperlipidemia, unspecified: Secondary | ICD-10-CM | POA: Diagnosis not present

## 2020-11-14 DIAGNOSIS — H2181 Floppy iris syndrome: Secondary | ICD-10-CM | POA: Diagnosis not present

## 2020-11-14 DIAGNOSIS — H527 Unspecified disorder of refraction: Secondary | ICD-10-CM | POA: Diagnosis not present

## 2020-11-14 DIAGNOSIS — H25811 Combined forms of age-related cataract, right eye: Secondary | ICD-10-CM | POA: Diagnosis not present

## 2020-11-14 DIAGNOSIS — Z7984 Long term (current) use of oral hypoglycemic drugs: Secondary | ICD-10-CM | POA: Diagnosis not present

## 2020-11-14 DIAGNOSIS — H02831 Dermatochalasis of right upper eyelid: Secondary | ICD-10-CM | POA: Diagnosis not present

## 2020-11-14 DIAGNOSIS — H02834 Dermatochalasis of left upper eyelid: Secondary | ICD-10-CM | POA: Diagnosis not present

## 2020-11-14 DIAGNOSIS — H25813 Combined forms of age-related cataract, bilateral: Secondary | ICD-10-CM | POA: Diagnosis not present

## 2020-11-14 DIAGNOSIS — H52203 Unspecified astigmatism, bilateral: Secondary | ICD-10-CM | POA: Diagnosis not present

## 2020-11-15 DIAGNOSIS — H52202 Unspecified astigmatism, left eye: Secondary | ICD-10-CM | POA: Diagnosis not present

## 2020-11-15 DIAGNOSIS — Z961 Presence of intraocular lens: Secondary | ICD-10-CM | POA: Diagnosis not present

## 2020-11-15 DIAGNOSIS — H25812 Combined forms of age-related cataract, left eye: Secondary | ICD-10-CM | POA: Diagnosis not present

## 2020-11-19 ENCOUNTER — Encounter: Payer: Self-pay | Admitting: Internal Medicine

## 2020-11-22 ENCOUNTER — Encounter: Payer: Self-pay | Admitting: Internal Medicine

## 2020-11-25 ENCOUNTER — Encounter: Payer: Self-pay | Admitting: Internal Medicine

## 2020-11-26 ENCOUNTER — Encounter: Payer: Self-pay | Admitting: Internal Medicine

## 2020-11-26 NOTE — Telephone Encounter (Signed)
LMOM asking that Pt call back to schedule visit for this morning. Only time PCP has is 11:20am. I have held the appt slot for her.

## 2020-11-27 ENCOUNTER — Ambulatory Visit (INDEPENDENT_AMBULATORY_CARE_PROVIDER_SITE_OTHER): Payer: Medicare Other | Admitting: Internal Medicine

## 2020-11-27 ENCOUNTER — Ambulatory Visit (HOSPITAL_BASED_OUTPATIENT_CLINIC_OR_DEPARTMENT_OTHER)
Admission: RE | Admit: 2020-11-27 | Discharge: 2020-11-27 | Disposition: A | Discharge status: Home / Self Care | Payer: Medicare Other | Source: Ambulatory Visit | Attending: Internal Medicine | Admitting: Internal Medicine

## 2020-11-27 ENCOUNTER — Other Ambulatory Visit: Payer: Self-pay

## 2020-11-27 VITALS — BP 163/92 | HR 109 | Temp 98.2°F | Ht 61.0 in | Wt 154.4 lb

## 2020-11-27 DIAGNOSIS — I509 Heart failure, unspecified: Secondary | ICD-10-CM | POA: Diagnosis not present

## 2020-11-27 DIAGNOSIS — J90 Pleural effusion, not elsewhere classified: Secondary | ICD-10-CM | POA: Diagnosis not present

## 2020-11-27 DIAGNOSIS — R000 Tachycardia, unspecified: Secondary | ICD-10-CM

## 2020-11-27 DIAGNOSIS — R0609 Other forms of dyspnea: Secondary | ICD-10-CM

## 2020-11-27 DIAGNOSIS — R0600 Dyspnea, unspecified: Secondary | ICD-10-CM | POA: Diagnosis not present

## 2020-11-27 DIAGNOSIS — R7989 Other specified abnormal findings of blood chemistry: Secondary | ICD-10-CM | POA: Diagnosis not present

## 2020-11-27 DIAGNOSIS — R002 Palpitations: Secondary | ICD-10-CM

## 2020-11-27 DIAGNOSIS — J9811 Atelectasis: Secondary | ICD-10-CM | POA: Diagnosis not present

## 2020-11-27 DIAGNOSIS — J811 Chronic pulmonary edema: Secondary | ICD-10-CM | POA: Diagnosis not present

## 2020-11-27 LAB — BASIC METABOLIC PANEL
BUN: 19 mg/dL (ref 6–23)
CO2: 35 mEq/L — ABNORMAL HIGH (ref 19–32)
Calcium: 8.8 mg/dL (ref 8.4–10.5)
Chloride: 95 mEq/L — ABNORMAL LOW (ref 96–112)
Creatinine, Ser: 0.76 mg/dL (ref 0.40–1.20)
GFR: 85 mL/min (ref >60.00)
Glucose, Bld: 275 mg/dL — ABNORMAL HIGH (ref 70–99)
Potassium: 3.8 mEq/L (ref 3.5–5.1)
Sodium: 138 mEq/L (ref 135–145)

## 2020-11-27 LAB — CBC WITH DIFFERENTIAL/PLATELET
Basophils Absolute: 0 10*3/uL (ref 0.0–0.1)
Basophils Relative: 0.7 % (ref 0.0–3.0)
Eosinophils Absolute: 0.2 10*3/uL (ref 0.0–0.7)
Eosinophils Relative: 2.7 % (ref 0.0–5.0)
HCT: 41 % (ref 36.0–46.0)
Hemoglobin: 13.2 g/dL (ref 12.0–15.0)
Lymphocytes Relative: 28.7 % (ref 12.0–46.0)
Lymphs Abs: 1.9 10*3/uL (ref 0.7–4.0)
MCHC: 32.3 g/dL (ref 30.0–36.0)
MCV: 91.7 fl (ref 78.0–100.0)
Monocytes Absolute: 0.6 10*3/uL (ref 0.1–1.0)
Monocytes Relative: 8.2 % (ref 3.0–12.0)
Neutro Abs: 4 10*3/uL (ref 1.4–7.7)
Neutrophils Relative %: 59.7 % (ref 43.0–77.0)
Platelets: 257 10*3/uL (ref 150.0–400.0)
RBC: 4.47 Mil/uL (ref 3.87–5.11)
RDW: 14.7 % (ref 11.5–15.5)
WBC: 6.7 10*3/uL (ref 4.0–10.5)

## 2020-11-27 LAB — D-DIMER, QUANTITATIVE: D-Dimer, Quant: 1.02 mcg/mL FEU — ABNORMAL HIGH (ref <0.50)

## 2020-11-27 MED ORDER — PROPRANOLOL HCL 20 MG PO TABS: 20.0000 mg | ORAL_TABLET | Freq: Three times a day (TID) | ORAL | 3 refills | Status: DC

## 2020-11-27 NOTE — Patient Instructions (Addendum)
Restart Inderal 20 mg twice daily  Check the  blood pressure every other day BP GOAL is between 110/65 and  135/85. If it is consistently higher or lower, let me know  If you are not gradually better let us know  If he has severe difficulty breathing, leg swelling, chest pain: Go to the ER   GO TO THE LAB : Get the blood work     GO TO THE FRONT DESK, PLEASE SCHEDULE YOUR APPOINTMENTS Come back for a checkup in 4 weeks  We will schedule echocardiogram and a stress test

## 2020-11-27 NOTE — Progress Notes (Signed)
Subjective:    Patient ID: Adrienne Bennett, female    DOB: 08/10/1960, 60 y.o.   MRN: [id number]  DOS:  11/27/2020 Type of visit - description: Acute visit, here with her husband.  The patient switch from propranolol to propanolol extended release approximately a week ago. Initially she felt great (see patient's message) The next day she suddenly had palpitations, DOE, severe fatigue.  3 days ago she decided to stop propanolol altogether. Still has palpitations and dyspnea on exertion.   Review of Systems Denies fever chills No chest pain Mild edema.  No calf pain No nausea vomiting No diarrhea No cough   Past Medical History:  Diagnosis Date  . 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 Depression    sess Dr.Plovsky  . High cholesterol   . Hypertension   . IBS (irritable bowel syndrome)    chronic Diarrhea  . Macular degeneration, dry   . Migraine    "long long time ago; maybe 20 yr ago" (09/01/2013)  . Osteoarthritis    "back; goes down my right leg" (09/01/2013)  . Type II diabetes mellitus (HCC) 2007    Past Surgical History:  Procedure Laterality Date  . CARPAL TUNNEL RELEASE Bilateral ~ 2000  . TONSILLECTOMY  1970  . TUBAL LIGATION  1996    Allergies as of 11/27/2020      Reactions   Benztropine    Dairy Aid [lactase]    Lisinopril    Dc 06-2015, had lip swelling, stomatitis   Penicillins    Monistat 3 Combo Pack App  [miconazole Nitrate Applicator] Itching, Rash      Medication List       Accurate as of Nov 27, 2020 11:59 PM. If you have any questions, ask your nurse or doctor.        STOP taking these medications   propranolol ER 120 MG 24 hr capsule Commonly known as: Inderal LA Replaced by: propranolol 20 MG tablet Stopped by: Willow Ora, MD     TAKE these medications   amLODipine 10 MG tablet Commonly known as: NORVASC Take 1  tablet (10 mg total) by mouth daily.   aspirin EC 81 MG tablet Take 81 mg by mouth daily. Swallow whole.   atorvastatin 20 MG tablet Commonly known as: LIPITOR Take 1 tablet (20 mg total) by mouth daily.   clonazePAM 1 MG tablet Commonly known as: KLONOPIN Take by mouth. 1 tab AM, 1/2 tab PM, rx by psychiatry   cyclobenzaprine 10 MG tablet Commonly known as: FLEXERIL Take 1 tablet (10 mg total) by mouth 2 (two) times daily as needed for muscle spasms.   divalproex 250 MG DR tablet Commonly known as: DEPAKOTE Take 250 mg by mouth. 1 tab AM, 2 tabs PM   insulin degludec 200 UNIT/ML FlexTouch Pen Commonly known as: TRESIBA Inject 100 Units into the skin at bedtime. If blood sugar consistently more than 200, increase to 120 units   meloxicam 7.5 MG tablet Commonly known as: MOBIC Take 1 tablet (7.5 mg total) by mouth daily as needed for pain.   metFORMIN 1000 MG (MOD) 24 hr tablet Commonly known as: GLUMETZA Take 1 tablet (1,000 mg total) by mouth 2 (two) times daily with a meal.   onetouch ultrasoft lancets Check blood sugar no more than twice daily.   OneTouch Delica Lancets 33G Misc USE   TO CHECK GLUCOSE TWICE  DAILY   OneTouch Verio test strip Generic drug: glucose blood Check blood sugars up to 4 times daily as directed by endocrinology   oxybutynin 5 MG 24 hr tablet Commonly known as: DITROPAN-XL Take 5 mg by mouth 2 (two) times daily.   propranolol 20 MG tablet Commonly known as: INDERAL Take 1 tablet (20 mg total) by mouth 3 (three) times daily. Replaces: propranolol ER 120 MG 24 hr capsule Started by: Willow Ora, MD   temazepam 15 MG capsule Commonly known as: RESTORIL          Objective:   Physical Exam BP (!) 163/92 (BP Location: Left Arm, Patient Position: Sitting, Cuff Size: Large)   Pulse (!) 109   Temp 98.2 F (36.8 C) (Temporal)   Ht 5\' 1"  (1.549 m)   Wt 154 lb 6.4 oz (70 kg)   SpO2 92%   BMI 29.17 kg/m  General:   Well developed, NAD,  BMI noted.  HEENT:  Normocephalic . Face symmetric, atraumatic. Neck: Difficult to assess JVD Lungs:  CTA B Normal respiratory effort, no intercostal retractions, no accessory muscle use. Heart: Regular with occasionally skipped beat.  Initially tachycardic when she arrived, I rechecked her heart rate manually: 90. Abdomen:  Not distended, soft, non-tender. No rebound or rigidity.   Skin: Not pale. Not jaundice Lower extremities: no pretibial edema bilaterally.  Calves symmetric and nontender Neurologic:  alert & oriented X3.  Speech normal, gait appropriate for age and unassisted Psych--  Cognition and judgment appear intact.  Cooperative with normal attention span and concentration.  Behavior appropriate. No anxious or depressed appearing.     Assessment      Assessment ENDO: started to see Dr 12/2017 DM  HTN -- dc lisinopril 06-2015>> lips swell x1 , also had stomatitis >> resolved  High cholesterol Fatty Liver per CT 04-2020 Thyroid disease -Multinodular goiter, thyromegaly w/ dominant nodule: Negative BX 01-2015 - Hyperthyroidism, subclinical, DX 2019 --s/p thyroid ablation per patient 2019 ? IBS.Chronic diarrhea DJD Psychiatry: Sees Dr. 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 All this in the context of changing her inderal formulation from plain to extended release; subsequently she stopped BBs 3 days ago. EKG today show tachycardia and nonspecific T wave changes (discussed with cardiology). O2 sat 91, 92%, last O2 sat was 95.  She is in no acute distress (checking a D-dimer and x-ray) Plan: Restart beta-blockers BMP, CBC, D-dimer, chest x-ray. Consider CT. Will also get echo and a Lexiscan for completeness. Watch symptoms, ER if symptoms severe.    Addendum: D-dimer slightly positive, chest x-ray consistent with CHF. Plan: CT chest, further advised with results RTC 4 weeks  Time spent: 42 minutes, discussing care with cardiology, assessing results the same day.  Ordering additional test based on a positive D-dimer, explaining results to the patient.  This visit occurred during the SARS-CoV-2 public health emergency.  Safety protocols were in place, including screening questions prior to the visit, additional usage of staff PPE, and extensive cleaning of exam room while observing appropriate contact time as indicated for disinfecting solutions.

## 2020-11-28 ENCOUNTER — Telehealth: Payer: Self-pay | Admitting: Internal Medicine

## 2020-11-28 ENCOUNTER — Ambulatory Visit (HOSPITAL_BASED_OUTPATIENT_CLINIC_OR_DEPARTMENT_OTHER)
Admission: RE | Admit: 2020-11-28 | Discharge: 2020-11-28 | Disposition: A | Discharge status: Home / Self Care | Payer: Medicare Other | Source: Ambulatory Visit | Attending: Internal Medicine | Admitting: Internal Medicine

## 2020-11-28 ENCOUNTER — Encounter (HOSPITAL_BASED_OUTPATIENT_CLINIC_OR_DEPARTMENT_OTHER): Payer: Self-pay

## 2020-11-28 DIAGNOSIS — R000 Tachycardia, unspecified: Secondary | ICD-10-CM | POA: Diagnosis not present

## 2020-11-28 DIAGNOSIS — R7989 Other specified abnormal findings of blood chemistry: Secondary | ICD-10-CM | POA: Diagnosis not present

## 2020-11-28 DIAGNOSIS — J90 Pleural effusion, not elsewhere classified: Secondary | ICD-10-CM | POA: Diagnosis not present

## 2020-11-28 DIAGNOSIS — J9811 Atelectasis: Secondary | ICD-10-CM | POA: Diagnosis not present

## 2020-11-28 DIAGNOSIS — I517 Cardiomegaly: Secondary | ICD-10-CM | POA: Diagnosis not present

## 2020-11-28 DIAGNOSIS — R0600 Dyspnea, unspecified: Secondary | ICD-10-CM | POA: Diagnosis not present

## 2020-11-28 DIAGNOSIS — J811 Chronic pulmonary edema: Secondary | ICD-10-CM | POA: Diagnosis not present

## 2020-11-28 DIAGNOSIS — R911 Solitary pulmonary nodule: Secondary | ICD-10-CM | POA: Diagnosis not present

## 2020-11-28 MED ORDER — IOHEXOL 350 MG/ML SOLN
100.0000 mL | Freq: Once | INTRAVENOUS | Status: AC | PRN
  Administered 2020-11-28: 100 mL via INTRAVENOUS

## 2020-11-28 MED ORDER — FUROSEMIDE 20 MG PO TABS: 20.0000 mg | ORAL_TABLET | Freq: Every day | ORAL | 1 refills | Status: DC

## 2020-11-28 NOTE — Telephone Encounter (Signed)
Adrienne Bennett from imaging called  CT report is resulted. - NO PE. But she says there were like 6 other annotations that needed to be reviewed.   She let the pt go because she wanted to eat, and the result took an hour to read anyway.

## 2020-11-28 NOTE — Assessment & Plan Note (Signed)
DOE, palpitations: Symptoms as described above, she denies chest pain, no edema. All this in the context of changing her inderal formulation from plain to extended release; subsequently she stopped BBs 3 days ago. EKG today show tachycardia and nonspecific T wave changes (discussed with cardiology). O2 sat 91, 92%, last O2 sat was 95.  She is in no acute distress (checking a D-dimer and x-ray) Plan: Restart beta-blockers BMP, CBC, D-dimer, chest x-ray. Consider CT. Will also get echo and a Lexiscan for completeness. Watch symptoms, ER if symptoms severe.   Addendum: D-dimer slightly positive, chest x-ray consistent with CHF. Plan: CT chest, further advised with results RTC 4 weeks

## 2020-11-28 NOTE — Telephone Encounter (Signed)
I reviewed the results with the patient. Chest x-ray suspicious for CHF D-dimer +, CT chest negative for PE, opacities could represent CHF but also atypical infection.  Patient started propanolol, still having DOE and some orthopnea.  No chest pain. Overall, suspect sxs d/t  CHF,   induced by stopping beta-blockers?. Plan: Start Lasix, Rx sent Check for COVID , call us if + Will ask cardiology to see within few days. ER if severe symptoms, chest pain, palpitations.  (Other CT findings include goiter and 3 mm nodule, will addressed at the next opportunity)

## 2020-11-29 ENCOUNTER — Telehealth (HOSPITAL_COMMUNITY): Payer: Self-pay | Admitting: *Deleted

## 2020-11-29 ENCOUNTER — Telehealth: Payer: Self-pay | Admitting: Emergency Medicine

## 2020-11-29 ENCOUNTER — Other Ambulatory Visit: Payer: Self-pay

## 2020-11-29 DIAGNOSIS — K589 Irritable bowel syndrome without diarrhea: Secondary | ICD-10-CM | POA: Insufficient documentation

## 2020-11-29 DIAGNOSIS — F32A Depression, unspecified: Secondary | ICD-10-CM | POA: Insufficient documentation

## 2020-11-29 DIAGNOSIS — M1990 Unspecified osteoarthritis, unspecified site: Secondary | ICD-10-CM | POA: Insufficient documentation

## 2020-11-29 DIAGNOSIS — I10 Essential (primary) hypertension: Secondary | ICD-10-CM | POA: Insufficient documentation

## 2020-11-29 DIAGNOSIS — Z20822 Contact with and (suspected) exposure to covid-19: Secondary | ICD-10-CM | POA: Diagnosis not present

## 2020-11-29 DIAGNOSIS — E7800 Pure hypercholesterolemia, unspecified: Secondary | ICD-10-CM | POA: Insufficient documentation

## 2020-11-29 NOTE — Telephone Encounter (Signed)
Left message on voicemail per DPR in reference to upcoming appointment scheduled on 12/02/2020 at 10:15 with detailed instructions given per Myocardial Perfusion Study Information Sheet for the test. LM to arrive 15 minutes early, and that it is imperative to arrive on time for appointment to keep from having the test rescheduled. If you need to cancel or reschedule your appointment, please call the office within 24 hours of your appointment. Failure to do so may result in a cancellation of your appointment, and a $50 no show fee. Phone number given for call back for any questions.

## 2020-11-29 NOTE — Telephone Encounter (Signed)
Left message for patient to return call with efforts to move up appointment to next week per Dr. [name] request.

## 2020-11-29 NOTE — Telephone Encounter (Signed)
Pt scheduled w/ Dr. Kirtland Bouchard on 12/03/2020.

## 2020-11-29 NOTE — Telephone Encounter (Signed)
Patient scheduled for sooner appointment

## 2020-11-29 NOTE — Telephone Encounter (Signed)
Patient returning call.

## 2020-12-02 ENCOUNTER — Ambulatory Visit (HOSPITAL_COMMUNITY): Payer: Medicare Other | Attending: Cardiology

## 2020-12-02 ENCOUNTER — Other Ambulatory Visit: Payer: Self-pay

## 2020-12-02 VITALS — Ht 61.0 in | Wt 154.0 lb

## 2020-12-02 DIAGNOSIS — R110 Nausea: Secondary | ICD-10-CM | POA: Insufficient documentation

## 2020-12-02 DIAGNOSIS — R000 Tachycardia, unspecified: Secondary | ICD-10-CM | POA: Diagnosis not present

## 2020-12-02 DIAGNOSIS — R0600 Dyspnea, unspecified: Secondary | ICD-10-CM

## 2020-12-02 DIAGNOSIS — R002 Palpitations: Secondary | ICD-10-CM | POA: Insufficient documentation

## 2020-12-02 DIAGNOSIS — R0609 Other forms of dyspnea: Secondary | ICD-10-CM

## 2020-12-02 LAB — MYOCARDIAL PERFUSION IMAGING
LV dias vol: 72 mL (ref 46–106)
LV sys vol: 39 mL
Peak HR: 100 {beats}/min
Rest HR: 90 {beats}/min
SDS: 0
SRS: 0
SSS: 0
TID: 1.14

## 2020-12-02 MED ORDER — AMINOPHYLLINE 25 MG/ML IV SOLN
75.0000 mg | Freq: Once | INTRAVENOUS | Status: AC
  Administered 2020-12-02: 75 mg via INTRAVENOUS

## 2020-12-02 MED ORDER — TECHNETIUM TC 99M TETROFOSMIN IV KIT
32.5000 | PACK | Freq: Once | INTRAVENOUS | Status: AC | PRN
  Administered 2020-12-02: 32.5 via INTRAVENOUS
  Filled 2020-12-02: qty 33

## 2020-12-02 MED ORDER — REGADENOSON 0.4 MG/5ML IV SOLN
0.4000 mg | Freq: Once | INTRAVENOUS | Status: AC
  Administered 2020-12-02: 0.4 mg via INTRAVENOUS

## 2020-12-02 MED ORDER — TECHNETIUM TC 99M TETROFOSMIN IV KIT
10.5000 | PACK | Freq: Once | INTRAVENOUS | Status: AC | PRN
  Administered 2020-12-02: 10.5 via INTRAVENOUS
  Filled 2020-12-02: qty 11

## 2020-12-03 ENCOUNTER — Encounter: Payer: Self-pay | Admitting: Cardiology

## 2020-12-03 ENCOUNTER — Ambulatory Visit: Payer: Medicare Other | Admitting: Cardiology

## 2020-12-03 VITALS — BP 126/70 | HR 97 | Ht 61.0 in | Wt 155.0 lb

## 2020-12-03 DIAGNOSIS — I509 Heart failure, unspecified: Secondary | ICD-10-CM | POA: Insufficient documentation

## 2020-12-03 DIAGNOSIS — E1365 Other specified diabetes mellitus with hyperglycemia: Secondary | ICD-10-CM

## 2020-12-03 DIAGNOSIS — I5042 Chronic combined systolic (congestive) and diastolic (congestive) heart failure: Secondary | ICD-10-CM | POA: Diagnosis not present

## 2020-12-03 DIAGNOSIS — F3174 Bipolar disorder, in full remission, most recent episode manic: Secondary | ICD-10-CM

## 2020-12-03 DIAGNOSIS — IMO0002 Reserved for concepts with insufficient information to code with codable children: Secondary | ICD-10-CM

## 2020-12-03 DIAGNOSIS — I10 Essential (primary) hypertension: Secondary | ICD-10-CM | POA: Diagnosis not present
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
## 2020-12-03 NOTE — Patient Instructions (Signed)
Medication Instructions:  Your physician recommends that you continue on your current medications as directed. Please refer to the Current Medication list given to you today.  *If you need a refill on your cardiac medications before your next appointment, please call your pharmacy*   Lab Work: Your physician recommends that you return for lab work today: bmp, pro bnp  If you have labs (blood work) drawn today and your tests are completely normal, you will receive your results only by: MyChart Message (if you have MyChart) OR A paper copy in the mail If you have any lab test that is abnormal or we need to change your treatment, we will call you to review the results.   Testing/Procedures: None   Follow-Up: At [facility], you and your health needs are our priority.  As part of our continuing mission to provide you with exceptional heart care, we have created designated Provider Care Teams.  These Care Teams include your primary Cardiologist (physician) and Advanced Practice Providers (APPs -  Physician Assistants and Nurse Practitioners) who all work together to provide you with the care you need, when you need it.  We recommend signing up for the patient portal called "MyChart".  Sign up information is provided on this After Visit Summary.  MyChart is used to connect with patients for Virtual Visits (Telemedicine).  Patients are able to view lab/test results, encounter notes, upcoming appointments, etc.  Non-urgent messages can be sent to your provider as well.   To learn more about what you can do with MyChart, go to [email or web address].    Your next appointment:   3 week(s)  The format for your next appointment:   In Person  Provider:   Robert Krasowski, MD    Other Instructions    

## 2020-12-04 LAB — BASIC METABOLIC PANEL
BUN/Creatinine Ratio: 34 — ABNORMAL HIGH (ref 12–28)
BUN: 24 mg/dL (ref 8–27)
CO2: 27 mmol/L (ref 20–29)
Calcium: 9.2 mg/dL (ref 8.7–10.3)
Chloride: 94 mmol/L — ABNORMAL LOW (ref 96–106)
Creatinine, Ser: 0.7 mg/dL (ref 0.57–1.00)
Glucose: 218 mg/dL — ABNORMAL HIGH (ref 65–99)
Potassium: 4.5 mmol/L (ref 3.5–5.2)
Sodium: 138 mmol/L (ref 134–144)
eGFR: 99 mL/min/{1.73_m2} (ref >59)

## 2020-12-04 LAB — PRO B NATRIURETIC PEPTIDE: NT-Pro BNP: 334 pg/mL — ABNORMAL HIGH (ref 0–287)

## 2020-12-05 ENCOUNTER — Telehealth: Payer: Self-pay | Admitting: Cardiology

## 2020-12-05 ENCOUNTER — Telehealth: Payer: Self-pay | Admitting: Emergency Medicine

## 2020-12-05 MED ORDER — METOPROLOL TARTRATE 25 MG PO TABS: 25.0000 mg | ORAL_TABLET | Freq: Two times a day (BID) | ORAL | 1 refills | Status: DC

## 2020-12-05 NOTE — Telephone Encounter (Signed)
New Message:    Pt called and said her doctor will need a letter stating why she can not have the surgery, Pre-Op and Post Op. Please fax to  Dr Cain Saupe office at [phone].

## 2020-12-05 NOTE — Telephone Encounter (Signed)
Called patient informed her of results. She will stop propranolol and start metoprolol tartrate 25 mg twice daily. No further questions.

## 2020-12-05 NOTE — Telephone Encounter (Signed)
-----   Message from Georgeanna Lea, MD sent at 12/05/2020 11:38 AM EDT ----- Stop propranolol, start metoprolol titrate 25 twice daily, her labs were good.  Mild congestive heart failure

## 2020-12-05 NOTE — Telephone Encounter (Signed)
    Adrienne Bennett DOB:  10-08-1960  MRN:  [id number]   Primary Cardiologist: None  Chart reviewed as part of pre-operative protocol coverage.   Per chart review her 'left eye phaco with iol with optiwave refractive analysis' with Dr. Hardie Shackleton [facility] has been cancelled. Stress test 12/02/20 was low risk study with no iscemic or infarction with LVEF 45-54% recommended for correlation with TTE. She was seen by Dr. [name] as a new patient for evaluation of shortness of breath and workup of possible heart failure. An echocardiogram has been scheduled for 12/09/20.   Cataract extractions are recognized in guidelines as low risk surgeries that do not typically require specific preoperative testing or holding of blood thinner therapy. However, given new cardiac workup will defer to Dr. [name] and request his input.    I will route this update recommendation to the requesting party via Epic fax function.  Alver Sorrow, NP 12/05/2020, 10:30 AM

## 2020-12-05 NOTE — Telephone Encounter (Signed)
    Adrienne Bennett DOB:  [birth year]  MRN:  [id number]   Primary Cardiologist: None  Chart reviewed as part of pre-operative protocol coverage. Per Dr .Vanetta Shawl recommendations - will defer providing clearance until after echocardiogram. Will leave in preop box to be addressed after echocardiogram.   Will route to Dr. Mirna Mires office so they are aware that further recommendations will be forthcoming after echo 12/09/20.   Please call with questions.  Adrienne Sorrow, NP 12/05/2020, 1:35 PM

## 2020-12-09 ENCOUNTER — Other Ambulatory Visit: Payer: Self-pay

## 2020-12-09 ENCOUNTER — Telehealth: Payer: Self-pay

## 2020-12-09 ENCOUNTER — Ambulatory Visit (HOSPITAL_COMMUNITY)
Admission: RE | Admit: 2020-12-09 | Discharge: 2020-12-09 | Disposition: A | Discharge status: Home / Self Care | Payer: Medicare Other | Source: Ambulatory Visit | Attending: Internal Medicine | Admitting: Internal Medicine

## 2020-12-09 DIAGNOSIS — I700 Atherosclerosis of aorta: Secondary | ICD-10-CM | POA: Insufficient documentation

## 2020-12-09 DIAGNOSIS — I110 Hypertensive heart disease with heart failure: Secondary | ICD-10-CM | POA: Diagnosis not present

## 2020-12-09 DIAGNOSIS — R0609 Other forms of dyspnea: Secondary | ICD-10-CM | POA: Diagnosis not present

## 2020-12-09 DIAGNOSIS — E119 Type 2 diabetes mellitus without complications: Secondary | ICD-10-CM | POA: Insufficient documentation

## 2020-12-09 DIAGNOSIS — I509 Heart failure, unspecified: Secondary | ICD-10-CM | POA: Diagnosis not present

## 2020-12-09 DIAGNOSIS — R000 Tachycardia, unspecified: Secondary | ICD-10-CM | POA: Diagnosis not present

## 2020-12-09 DIAGNOSIS — E785 Hyperlipidemia, unspecified: Secondary | ICD-10-CM | POA: Insufficient documentation

## 2020-12-09 DIAGNOSIS — R002 Palpitations: Secondary | ICD-10-CM

## 2020-12-09 DIAGNOSIS — R0600 Dyspnea, unspecified: Secondary | ICD-10-CM | POA: Diagnosis not present

## 2020-12-09 DIAGNOSIS — I351 Nonrheumatic aortic (valve) insufficiency: Secondary | ICD-10-CM | POA: Insufficient documentation

## 2020-12-09 LAB — ECHOCARDIOGRAM COMPLETE
Area-P 1/2: 3.34 cm2
P 1/2 time: 322 msec
S' Lateral: 2.4 cm

## 2020-12-09 NOTE — Progress Notes (Signed)
  Echocardiogram 2D Echocardiogram has been performed.  Tye Savoy 12/09/2020, 4:05 PM

## 2020-12-09 NOTE — Telephone Encounter (Signed)
Received call from Kingsley at [facility]- wanting to add perflutren to echo- informed that PCP has never previously ordered echo's with that and he is out until 12/17/2020 I am unable to give verbal to add. Baird Lyons states they will proceed with what they have but Pt may have to come back for an echo limited as they are having trouble seeing the walls of the heart.

## 2020-12-12 NOTE — Telephone Encounter (Signed)
   Patient Name: Adrienne Bennett  DOB: 01-Apr-1961  MRN: [id number]   Primary Cardiologist: [name], MD  Chart reviewed as part of pre-operative protocol coverage. Cataract extractions are recognized in guidelines as low risk surgeries that do not typically require specific preoperative testing or holding of blood thinner therapy. Therefore, given past medical history and time since last visit, based on ACC/AHA guidelines, Adrienne Bennett would be at acceptable risk for the planned procedure without further cardiovascular testing.   I will route this recommendation to the requesting party via Epic fax function and remove from pre-op pool.  Please call with questions.  Ronney Asters, NP 12/12/2020, 8:09 AM

## 2020-12-25 ENCOUNTER — Ambulatory Visit (INDEPENDENT_AMBULATORY_CARE_PROVIDER_SITE_OTHER): Payer: Medicare Other | Admitting: Internal Medicine

## 2020-12-25 ENCOUNTER — Encounter: Payer: Self-pay | Admitting: Internal Medicine

## 2020-12-25 ENCOUNTER — Other Ambulatory Visit: Payer: Self-pay

## 2020-12-25 VITALS — BP 168/94 | HR 114 | Temp 98.4°F | Resp 18 | Ht 61.0 in | Wt 156.4 lb

## 2020-12-25 DIAGNOSIS — I5042 Chronic combined systolic (congestive) and diastolic (congestive) heart failure: Secondary | ICD-10-CM | POA: Diagnosis not present

## 2020-12-25 MED ORDER — FUROSEMIDE 20 MG PO TABS: 20.0000 mg | ORAL_TABLET | Freq: Every day | ORAL | 1 refills | Status: DC

## 2020-12-25 MED ORDER — METOPROLOL TARTRATE 50 MG PO TABS: 50.0000 mg | ORAL_TABLET | Freq: Two times a day (BID) | ORAL | 1 refills | Status: DC

## 2020-12-25 NOTE — Progress Notes (Signed)
Subjective:    Patient ID: Adrienne Bennett, female    DOB: 11-Nov-1960, 60 y.o.   MRN: [id number]  DOS:  12/25/2020 Type of visit - description: Follow-up  Patient was seen 11/27/2020 with DOE, palpitations. D-dimer was a slightly positive, lung CT-A showed no PE. The working diagnosis was CHF.  Subsequently she saw cardiology 12/03/2020. By the time she was seen, she was on Lasix and felt better.  BNP was moderately elevated. Stress test and echo were done.   Wt Readings from Last 3 Encounters:  12/25/20 156 lb 6 oz (70.9 kg)  12/03/20 155 lb (70.3 kg)  12/02/20 154 lb (69.9 kg)    Review of Systems At this point she is feeling better. Still has some residual DOE. She is tachycardic but subjectively feels no palpitations. Denies chest pain Edema is minimal.   Past Medical History:  Diagnosis Date  . Annual physical exam 07/13/2012  . 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 Depression    sess Dr.Plovsky  . Diabetic retinopathy (HCC) 03/31/2018  . DJD (degenerative joint disease) 03/04/2016  . DM (diabetes mellitus), secondary uncontrolled (HCC) 07/16/2008   Qualifier: Diagnosis of  By: [name] MD, [name].   . Essential hypertension 09/04/2010   Qualifier: Diagnosis of  By: [name] MD, Jarious Lyon E.   . GANGLION CYST 07/22/2010   Qualifier: Diagnosis of  By: [name] MD, [name].   . GERD (gastroesophageal reflux disease) 05/23/2017  . High cholesterol   . Hyperlipidemia 10/12/2012  . Hypertension   . IBS (irritable bowel syndrome)    chronic Diarrhea  . IBS ? 07/16/2008   Qualifier: Diagnosis of  By: [name] MD, [name] Macular degeneration, dry   . Migraine    "long long time ago; maybe 20 yr ago" (09/01/2013)  .  MIGRAINE HEADACHE 03/13/2010   Qualifier: Diagnosis of  By: [name] MD, Wesly Whisenant E.   . Mouth sores 04/15/2017  . Neck pain 02/03/2011  . Osteoarthritis    "back; goes down my right leg" (09/01/2013)  . Subclinical hyperthyroidism 02/26/2018  . Thyromegaly 12/04/2014  . Type II diabetes mellitus (HCC) 2007    Past Surgical History:  Procedure Laterality Date  . CARPAL TUNNEL RELEASE Bilateral ~ 2000  . TONSILLECTOMY  1970  . TUBAL LIGATION  1996    Allergies as of 12/25/2020      Reactions   Benztropine    Dairy Aid [lactase]    Lisinopril    Dc 06-2015, had lip swelling, stomatitis   Penicillins    Monistat 3 Combo Pack App  [miconazole Nitrate Applicator] Itching, Rash      Medication List       Accurate as of December 25, 2020  3:29 PM. If you have any questions, ask your nurse or doctor.        amLODipine 10 MG tablet Commonly known as: NORVASC Take 1 tablet (10 mg total) by mouth daily.   aspirin EC 81 MG tablet Take 81 mg by mouth daily. Swallow whole.   atorvastatin 20 MG tablet Commonly known as: LIPITOR Take 1 tablet (20 mg total) by mouth daily.   clonazePAM 1 MG tablet Commonly known as: KLONOPIN Take 1 mg by mouth 2 (two) times  daily. 1 tab AM, 1 tab PM, rx by psychiatry   cyclobenzaprine 10 MG tablet Commonly known as: FLEXERIL Take 1 tablet (10 mg total) by mouth 2 (two) times daily as needed for muscle spasms.   divalproex 250 MG DR tablet Commonly known as: DEPAKOTE Take 250 mg by mouth 2 (two) times daily. 1 tab AM, 2 tabs PM   furosemide 20 MG tablet Commonly known as: LASIX Take 1 tablet (20 mg total) by mouth daily.   insulin degludec 200 UNIT/ML FlexTouch Pen Commonly known as: TRESIBA Inject 100 Units into the skin at bedtime. If blood sugar consistently more than 200, increase to 120 units   meloxicam 7.5 MG tablet Commonly known as: MOBIC Take 1 tablet (7.5 mg total) by mouth daily as needed for pain.   metFORMIN 1000 MG (MOD) 24 hr  tablet Commonly known as: GLUMETZA Take 1 tablet (1,000 mg total) by mouth 2 (two) times daily with a meal.   metoprolol tartrate 25 MG tablet Commonly known as: LOPRESSOR Take 1 tablet (25 mg total) by mouth 2 (two) times daily.          Objective:   Physical Exam BP (!) 168/94 (BP Location: Left Arm, Patient Position: Sitting, Cuff Size: Small)   Pulse (!) 114   Temp 98.4 F (36.9 C) (Oral)   Resp 18   Ht 5\' 1"  (1.549 m)   Wt 156 lb 6 oz (70.9 kg)   SpO2 96%   BMI 29.55 kg/m  General:   Well developed, NAD, BMI noted. HEENT:  Normocephalic . Face symmetric, atraumatic Lungs:  CTA B Normal respiratory effort, no intercostal retractions, no accessory muscle use. Heart: RRR,  no murmur.  Lower extremities: Trace pretibial edema bilaterally  Skin: Not pale. Not jaundice Neurologic:  alert & oriented X3.  Speech normal, gait appropriate for age and unassisted Psych--  Cognition and judgment appear intact.  Cooperative with normal attention span and concentration.  Behavior appropriate. No anxious or depressed appearing.      Assessment     Assessment ENDO: started to see Dr 12/2017 DM  HTN -- dc lisinopril 06-2015>> lips swell x1 , also had stomatitis >> resolved  High cholesterol Fatty Liver per CT 04-2020 Thyroid disease -Multinodular goiter, thyromegaly w/ dominant nodule: Negative BX 01-2015 - Hyperthyroidism, subclinical, DX 2019 --s/p thyroid ablation per patient 2019 ? IBS.Chronic diarrhea DJD Psychiatry: Sees Dr. 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 D-dimer slightly positive, CTA negative for PE. Stress test: Reduced EF, 45 to 54%, no ischemia. Echo: Preserved LV function. Cardiology recommended medical treatment. She is currently on Lasix,  beta-blockers. Although she has not lost weight she symptomatically is better. Plan: Continue Lasix, increase metoprolol from 25 mg to 50 mg twice daily, continue amlodipine, check a CMP. If increased dose of metoprolol is causing difficulty breathing, swelling or any other problems she is to let me know. Follow-up with cardiology next week as planned. Depression, bipolar, PTSD: Under the care of psychiatry, c/o insomnia, she is taking only 1 clonazepam, she could increase it to twice a day . RTC 3 months   This visit occurred during the SARS-CoV-2 public health emergency.  Safety protocols were in place, including screening questions prior to the visit, additional usage of staff PPE, and extensive cleaning of exam room while observing appropriate contact time  as indicated for disinfecting solutions.

## 2020-12-25 NOTE — Patient Instructions (Signed)
Increase metoprolol to 50 mg twice daily  Other medications the same  Check the  blood pressure daily BP GOAL is between 110/65 and  135/85. If it is consistently higher or lower, let me know  Let me know if the higher dose of metoprolol is affecting you   GO TO THE LAB : Get the blood work     GO TO THE FRONT DESK, PLEASE SCHEDULE YOUR APPOINTMENTS Come back for   a checkup in 3 months

## 2020-12-26 NOTE — Assessment & Plan Note (Signed)
CHF with preserved EF: See last visit, was seen with DOE, palpitations. D-dimer slightly positive, CTA negative for PE. Stress test: Reduced EF, 45 to 54%, no ischemia. Echo: Preserved LV function. Cardiology recommended medical treatment. She is currently on Lasix, beta-blockers. Although she has not lost weight she symptomatically is better. Plan: Continue Lasix, increase metoprolol from 25 mg to 50 mg twice daily, continue amlodipine, check a CMP. If increased dose of metoprolol is causing difficulty breathing, swelling or any other problems she is to let me know. Follow-up with cardiology next week as planned. Depression, bipolar, PTSD: Under the care of psychiatry, c/o insomnia, she is taking only 1 clonazepam, she could increase it to twice a day .

## 2020-12-27 LAB — COMPREHENSIVE METABOLIC PANEL
ALT: 15 U/L (ref 0–35)
AST: 18 U/L (ref 0–37)
Albumin: 3.8 g/dL (ref 3.5–5.2)
Alkaline Phosphatase: 94 U/L (ref 39–117)
BUN: 19 mg/dL (ref 6–23)
CO2: 36 mEq/L — ABNORMAL HIGH (ref 19–32)
Calcium: 9 mg/dL (ref 8.4–10.5)
Chloride: 94 mEq/L — ABNORMAL LOW (ref 96–112)
Creatinine, Ser: 0.77 mg/dL (ref 0.40–1.20)
GFR: 83.63 mL/min (ref >60.00)
Glucose, Bld: 155 mg/dL — ABNORMAL HIGH (ref 70–99)
Potassium: 4.6 mEq/L (ref 3.5–5.1)
Sodium: 140 mEq/L (ref 135–145)
Total Bilirubin: 0.3 mg/dL (ref 0.2–1.2)
Total Protein: 7.8 g/dL (ref 6.0–8.3)

## 2020-12-30 DIAGNOSIS — E7800 Pure hypercholesterolemia, unspecified: Secondary | ICD-10-CM | POA: Insufficient documentation

## 2021-01-01 ENCOUNTER — Encounter: Payer: Self-pay | Admitting: Cardiology

## 2021-01-01 ENCOUNTER — Ambulatory Visit: Payer: Medicare Other | Admitting: Cardiology

## 2021-01-01 ENCOUNTER — Other Ambulatory Visit: Payer: Self-pay

## 2021-01-01 VITALS — BP 144/92 | HR 56 | Ht 61.0 in | Wt 158.0 lb

## 2021-01-01 DIAGNOSIS — F3174 Bipolar disorder, in full remission, most recent episode manic: Secondary | ICD-10-CM

## 2021-01-01 DIAGNOSIS — E785 Hyperlipidemia, unspecified: Secondary | ICD-10-CM

## 2021-01-01 DIAGNOSIS — I5032 Chronic diastolic (congestive) heart failure: Secondary | ICD-10-CM

## 2021-01-01 DIAGNOSIS — I739 Peripheral vascular disease, unspecified: Secondary | ICD-10-CM | POA: Diagnosis not present

## 2021-01-01 DIAGNOSIS — I10 Essential (primary) hypertension: Secondary | ICD-10-CM

## 2021-01-01 DIAGNOSIS — E1365 Other specified diabetes mellitus with hyperglycemia: Secondary | ICD-10-CM | POA: Diagnosis not present

## 2021-01-01 DIAGNOSIS — IMO0002 Reserved for concepts with insufficient information to code with codable children: Secondary | ICD-10-CM

## 2021-01-01 MED ORDER — FUROSEMIDE 40 MG PO TABS: 40.0000 mg | ORAL_TABLET | Freq: Two times a day (BID) | ORAL | 1 refills | Status: DC

## 2021-01-01 NOTE — Patient Instructions (Signed)
Medication Instructions:  Your physician has recommended you make the following change in your medication:  INCREASE: Lasix to 40 mg twice daily  *If you need a refill on your cardiac medications before your next appointment, please call your pharmacy*   Lab Work: Your physician recommends that you return for lab work in 1 week : bmp  If you have labs (blood work) drawn today and your tests are completely normal, you will receive your results only by: Marland Kitchen MyChart Message (if you have MyChart) OR . A paper copy in the mail If you have any lab test that is abnormal or we need to change your treatment, we will call you to review the results.   Testing/Procedures: Your physician has requested that you have a lower extremity arterial exercise duplex. During this test, exercise and ultrasound are used to evaluate arterial blood flow in the legs. Allow one hour for this exam. There are no restrictions or special instructions.    Follow-Up: At [facility], you and your health needs are our priority.  As part of our continuing mission to provide you with exceptional heart care, we have created designated Provider Care Teams.  These Care Teams include your primary Cardiologist (physician) and Advanced Practice Providers (APPs -  Physician Assistants and Nurse Practitioners) who all work together to provide you with the care you need, when you need it.  We recommend signing up for the patient portal called "MyChart".  Sign up information is provided on this After Visit Summary.  MyChart is used to connect with patients for Virtual Visits (Telemedicine).  Patients are able to view lab/test results, encounter notes, upcoming appointments, etc.  Non-urgent messages can be sent to your provider as well.   To learn more about what you can do with MyChart, go to [email or web address].    Your next appointment:   3 month(s)  The format for your next appointment:   In Person  Provider:   [name], MD   Other Instructions
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
## 2021-01-01 NOTE — Addendum Note (Signed)
Addended by: Hazle Quant on: 01/01/2021 03:08 PM   Modules accepted: Orders

## 2021-01-08 DIAGNOSIS — E11319 Type 2 diabetes mellitus with unspecified diabetic retinopathy without macular edema: Secondary | ICD-10-CM | POA: Diagnosis not present

## 2021-01-08 DIAGNOSIS — E1165 Type 2 diabetes mellitus with hyperglycemia: Secondary | ICD-10-CM | POA: Diagnosis not present

## 2021-01-08 DIAGNOSIS — Z794 Long term (current) use of insulin: Secondary | ICD-10-CM | POA: Diagnosis not present

## 2021-01-08 DIAGNOSIS — E042 Nontoxic multinodular goiter: Secondary | ICD-10-CM | POA: Diagnosis not present

## 2021-01-08 LAB — HEMOGLOBIN A1C: Hemoglobin A1C: 9

## 2021-01-10 ENCOUNTER — Other Ambulatory Visit (HOSPITAL_COMMUNITY): Payer: Self-pay | Admitting: Cardiology

## 2021-01-10 DIAGNOSIS — M79604 Pain in right leg: Secondary | ICD-10-CM

## 2021-01-12 ENCOUNTER — Other Ambulatory Visit: Payer: Self-pay | Admitting: Internal Medicine

## 2021-01-12 NOTE — Addendum Note (Signed)
Encounter addended by: Novella Olive on: 01/12/2021 1:11 PM  Actions taken: Letter saved

## 2021-01-15 ENCOUNTER — Other Ambulatory Visit: Payer: Self-pay | Admitting: Internal Medicine

## 2021-01-17 ENCOUNTER — Encounter: Payer: Self-pay | Admitting: Internal Medicine

## 2021-01-28 ENCOUNTER — Ambulatory Visit (HOSPITAL_COMMUNITY)
Admission: RE | Admit: 2021-01-28 | Discharge: 2021-01-28 | Disposition: A | Discharge status: Home / Self Care | Payer: Medicare Other | Source: Ambulatory Visit | Attending: Cardiovascular Disease | Admitting: Cardiovascular Disease

## 2021-01-28 ENCOUNTER — Other Ambulatory Visit: Payer: Self-pay | Admitting: Cardiology

## 2021-01-28 ENCOUNTER — Other Ambulatory Visit: Payer: Self-pay

## 2021-01-28 DIAGNOSIS — M79605 Pain in left leg: Secondary | ICD-10-CM | POA: Diagnosis not present

## 2021-01-28 DIAGNOSIS — M79604 Pain in right leg: Secondary | ICD-10-CM

## 2021-01-28 DIAGNOSIS — I739 Peripheral vascular disease, unspecified: Secondary | ICD-10-CM | POA: Insufficient documentation

## 2021-01-28 DIAGNOSIS — F3174 Bipolar disorder, in full remission, most recent episode manic: Secondary | ICD-10-CM

## 2021-01-28 DIAGNOSIS — E1365 Other specified diabetes mellitus with hyperglycemia: Secondary | ICD-10-CM | POA: Diagnosis not present

## 2021-01-28 DIAGNOSIS — IMO0002 Reserved for concepts with insufficient information to code with codable children: Secondary | ICD-10-CM

## 2021-01-28 DIAGNOSIS — E785 Hyperlipidemia, unspecified: Secondary | ICD-10-CM | POA: Diagnosis not present

## 2021-01-28 DIAGNOSIS — I10 Essential (primary) hypertension: Secondary | ICD-10-CM | POA: Diagnosis not present

## 2021-01-31 ENCOUNTER — Telehealth: Payer: Self-pay

## 2021-01-31 ENCOUNTER — Other Ambulatory Visit: Payer: Self-pay | Admitting: Internal Medicine

## 2021-01-31 NOTE — Telephone Encounter (Signed)
-----   Message from Georgeanna Lea, MD sent at 01/31/2021  8:34 AM EDT ----- Normal arterial duplex evaluation of lower extremities

## 2021-01-31 NOTE — Telephone Encounter (Signed)
Left message on patients voicemail to please return our call.   

## 2021-02-03 NOTE — Telephone Encounter (Signed)
Called patient. Patient made aware of her ultrasound results. No questions or concerns expressed at this time.

## 2021-02-03 NOTE — Telephone Encounter (Signed)
Patient returning call.

## 2021-02-13 ENCOUNTER — Telehealth: Payer: Self-pay | Admitting: Internal Medicine

## 2021-02-13 NOTE — Telephone Encounter (Signed)
Spoke with patient she declined AWV and do not want any calls

## 2021-02-20 ENCOUNTER — Encounter: Payer: Self-pay | Admitting: Internal Medicine

## 2021-02-20 ENCOUNTER — Other Ambulatory Visit: Payer: Self-pay | Admitting: Internal Medicine

## 2021-02-23 ENCOUNTER — Telehealth: Payer: Medicare Other | Admitting: Physician Assistant

## 2021-02-23 ENCOUNTER — Encounter: Payer: Self-pay | Admitting: Physician Assistant

## 2021-02-23 DIAGNOSIS — U071 COVID-19: Secondary | ICD-10-CM | POA: Diagnosis not present

## 2021-02-23 DIAGNOSIS — R059 Cough, unspecified: Secondary | ICD-10-CM

## 2021-02-23 DIAGNOSIS — Z20822 Contact with and (suspected) exposure to covid-19: Secondary | ICD-10-CM | POA: Diagnosis not present

## 2021-02-23 MED ORDER — BENZONATATE 200 MG PO CAPS: 200.0000 mg | ORAL_CAPSULE | Freq: Two times a day (BID) | ORAL | 0 refills | Status: DC | PRN

## 2021-02-23 MED ORDER — MOLNUPIRAVIR EUA 200MG CAPSULE
4.0000 | ORAL_CAPSULE | Freq: Two times a day (BID) | ORAL | 0 refills | Status: AC
  Filled 2021-02-23: qty 40, 5d supply, fill #0
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
## 2021-02-24 ENCOUNTER — Other Ambulatory Visit (HOSPITAL_BASED_OUTPATIENT_CLINIC_OR_DEPARTMENT_OTHER): Payer: Self-pay

## 2021-02-26 ENCOUNTER — Encounter: Payer: Self-pay | Admitting: Internal Medicine

## 2021-02-26 ENCOUNTER — Other Ambulatory Visit: Payer: Self-pay

## 2021-02-26 ENCOUNTER — Telehealth (INDEPENDENT_AMBULATORY_CARE_PROVIDER_SITE_OTHER): Payer: Medicare Other | Admitting: Internal Medicine

## 2021-02-26 VITALS — Ht 61.0 in | Wt 145.0 lb

## 2021-02-26 DIAGNOSIS — U071 COVID-19: Secondary | ICD-10-CM | POA: Diagnosis not present

## 2021-02-26 MED ORDER — HYDROCODONE BIT-HOMATROP MBR 5-1.5 MG/5ML PO SOLN: 5.0000 mL | Freq: Every evening | ORAL | 0 refills | Status: DC | PRN

## 2021-02-26 MED ORDER — ALBUTEROL SULFATE HFA 108 (90 BASE) MCG/ACT IN AERS
2.0000 | INHALATION_SPRAY | Freq: Four times a day (QID) | RESPIRATORY_TRACT | 2 refills | Status: DC | PRN
Start: 2021-02-26 — End: 2023-10-04

## 2021-02-26 NOTE — Progress Notes (Signed)
Subjective:    Patient ID: Adrienne Bennett, female    DOB: 10/18/1960, 60 y.o.   MRN: [id number]  DOS:  02/26/2021 Type of visit - description: Virtual Visit via Video Note  I connected with the above patient  by a video enabled telemedicine application and verified that I am speaking with the correct person using two identifiers.   THIS ENCOUNTER IS A VIRTUAL VISIT DUE TO COVID-19 - PATIENT WAS NOT SEEN IN THE OFFICE. PATIENT HAS CONSENTED TO VIRTUAL VISIT / TELEMEDICINE VISIT   Location of patient: home  Location of provider: office  Persons participating in the virtual visit: patient, provider   I discussed the limitations of evaluation and management by telemedicine and the availability of in person appointments. The patient expressed understanding and agreed to proceed.   Acute  Symptoms startedn 02/19/2021, she tested positive for COVID in the next day. On 02/23/2021 she had a virtual visit with urgent care was prescribed molnupiravir and Tessalon Perles. The reason she is calling is because she continues with cough. Reports severe cough for the last 3 nights, has not been able to sleep. She has also noted some wheezing. She is unable to bring up any mucus despite the fact that she hears chest congestion.  At this point denies fever chills No sinus discharge Shortness of breath and chest pain only with episodes of cough. 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    Essential hypertension 09/04/2010   Qualifier: Diagnosis of  By: [name] MD, [name].    GANGLION CYST 07/22/2010   Qualifier: Diagnosis of  By: [name] MD, Maryalice Pasley E.    GERD (gastroesophageal reflux disease) 05/23/2017   High cholesterol    Hyperlipidemia 10/12/2012   Hypertension    IBS (irritable bowel syndrome)    chronic Diarrhea   IBS ? 07/16/2008   Qualifier: Diagnosis of  By: [name] MD, [name].    Macular degeneration, dry    Migraine    "long long time ago; maybe 20 yr ago" (09/01/2013)   MIGRAINE HEADACHE 03/13/2010   Qualifier: Diagnosis of  By: [name] MD, Antha Niday E.    Mouth sores 04/15/2017   Neck pain 02/03/2011   Osteoarthritis    "back; goes down my right leg" (09/01/2013)   Subclinical hyperthyroidism 02/26/2018   Thyromegaly 12/04/2014   Type II diabetes mellitus ([facility]) 2007    Past Surgical History:  Procedure Laterality Date   CARPAL TUNNEL RELEASE Bilateral ~ 2000   TONSILLECTOMY  1970   TUBAL LIGATION  1996    Allergies as of 02/26/2021       Reactions   Benztropine    Dairy Aid [lactase]    Lisinopril    Dc 06-2015, had lip swelling, stomatitis   Penicillins    Monistat 3 Combo Pack App  [miconazole Nitrate Applicator] Itching, Rash        Medication List  Accurate as of February 26, 2021 11:32 AM. If you have any questions, ask your nurse or doctor.          amLODipine 10 MG tablet Commonly known as: NORVASC Take 1 tablet (10 mg total) by mouth daily.   aspirin EC 81 MG tablet Take 81 mg by mouth daily. Swallow whole.   atorvastatin 20 MG tablet Commonly known as: LIPITOR Take 1 tablet (20 mg total) by mouth daily.   benzonatate 200 MG capsule Commonly known as: TESSALON Take 1 capsule (200 mg total) by mouth 2 (two) times daily as needed for cough.   clonazePAM 1 MG tablet Commonly known as: KLONOPIN Take 1 mg by mouth 2 (two) times daily. 1 tab AM, 1 tab PM, rx  by psychiatry   cyclobenzaprine 10 MG tablet Commonly known as: FLEXERIL Take 1 tablet (10 mg total) by mouth 2 (two) times daily as needed for muscle spasms.   divalproex 250 MG DR tablet Commonly known as: DEPAKOTE Take 250 mg by mouth 2 (two) times daily. 1 tab AM, 2 tabs PM   furosemide 40 MG tablet Commonly known as: LASIX Take 1 tablet (40 mg total) by mouth 2 (two) times daily.   insulin degludec 200 UNIT/ML FlexTouch Pen Commonly known as: TRESIBA Inject 100 Units into the skin at bedtime. If blood sugar consistently more than 200, increase to 120 units   L[place]vrio 200 MG Caps Generic drug: Molnupiravir Take 4 capsules (800 mg total) by mouth 2 (two) times daily for 5 days.   meloxicam 7.5 MG tablet Commonly known as: MOBIC Take 1 tablet (7.5 mg total) by mouth daily as needed for pain.   metFORMIN 1000 MG (MOD) 24 hr tablet Commonly known as: GLUMETZA Take 1 tablet (1,000 mg total) by mouth 2 (two) times daily with a meal.   metoprolol tartrate 50 MG tablet Commonly known as: LOPRESSOR Take 1 tablet (50 mg total) by mouth 2 (two) times daily.           Objective:   Physical Exam Ht 5\' 1"  (1.549 m)   Wt 145 lb (65.8 kg)   BMI 27.40 kg/m  This is a virtual video visit, she is alert oriented x3, does not seem to be in distress, speaking in complete sentences.  No cough noted. No vital signs or O2 sats available.    Assessment      Assessment ENDO: started to see Dr 12/2017 DM  HTN -- dc lisinopril 06-2015>> lips swell x1 , also had stomatitis >> resolved  High cholesterol Fatty Liver per CT 04-2020 Thyroid disease -Multinodular goiter, thyromegaly w/ dominant nodule: Negative BX 01-2015 - Hyperthyroidism, subclinical, DX 2019 --s/p thyroid ablation per patient 2019 ? IBS.Chronic diarrhea DJD Psychiatry: Sees Dr. 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 Her main concern today is severe cough, chest congestion, wheezing.  She is only taking 02/25/2021. No vital signs available, no O2 sats available. She does not look in distress. Plan:   continue antivirals, continue Tessalon Perles. Add OTC Robitussin-DM, albuterol if wheezing, hydrocodone at bedtime for severe cough.   She said that last night she felt very poorly but did not like to call 911, encouraged to do so if needed. She verbalized understanding.     I discussed the assessment and treatment plan with the patient. The patient was provided an opportunity to  ask questions and all were answered. The patient agreed with the plan and demonstrated an understanding of the instructions.   The patient was advised to call back or seek an in-person evaluation if the symptoms worsen or if the condition fails to improve as anticipated.

## 2021-02-27 NOTE — Assessment & Plan Note (Signed)
COVID-19, Dx on 02/20/2021, started [facility] 02/23/2021. Her main concern today is severe cough, chest congestion, wheezing.  She is only taking [place]. No vital signs available, no O2 sats available. She does not look in distress. Plan:   continue antivirals, continue Tessalon Perles. Add OTC Robitussin-DM, albuterol if wheezing, hydrocodone at bedtime for severe cough.   She said that last night she felt very poorly but did not like to call 911, encouraged to do so if needed. She verbalized understanding.

## 2021-03-19 ENCOUNTER — Other Ambulatory Visit: Payer: Self-pay | Admitting: Internal Medicine

## 2021-03-25 DIAGNOSIS — H25812 Combined forms of age-related cataract, left eye: Secondary | ICD-10-CM | POA: Diagnosis not present

## 2021-03-25 LAB — HM DIABETES EYE EXAM

## 2021-03-27 ENCOUNTER — Encounter: Payer: Self-pay | Admitting: Internal Medicine

## 2021-03-27 ENCOUNTER — Other Ambulatory Visit: Payer: Self-pay

## 2021-03-27 ENCOUNTER — Ambulatory Visit (INDEPENDENT_AMBULATORY_CARE_PROVIDER_SITE_OTHER): Payer: Medicare Other | Admitting: Internal Medicine

## 2021-03-27 VITALS — BP 136/82 | HR 88 | Temp 97.9°F | Resp 16 | Ht 61.0 in | Wt 147.1 lb

## 2021-03-27 DIAGNOSIS — Z01419 Encounter for gynecological examination (general) (routine) without abnormal findings: Secondary | ICD-10-CM

## 2021-03-27 DIAGNOSIS — E7800 Pure hypercholesterolemia, unspecified: Secondary | ICD-10-CM

## 2021-03-27 DIAGNOSIS — R2689 Other abnormalities of gait and mobility: Secondary | ICD-10-CM

## 2021-03-27 DIAGNOSIS — R21 Rash and other nonspecific skin eruption: Secondary | ICD-10-CM | POA: Diagnosis not present

## 2021-03-27 DIAGNOSIS — I10 Essential (primary) hypertension: Secondary | ICD-10-CM | POA: Diagnosis not present

## 2021-03-27 DIAGNOSIS — E010 Iodine-deficiency related diffuse (endemic) goiter: Secondary | ICD-10-CM

## 2021-03-27 DIAGNOSIS — Z1231 Encounter for screening mammogram for malignant neoplasm of breast: Secondary | ICD-10-CM

## 2021-03-27 DIAGNOSIS — F3174 Bipolar disorder, in full remission, most recent episode manic: Secondary | ICD-10-CM

## 2021-03-27 DIAGNOSIS — Z1272 Encounter for screening for malignant neoplasm of vagina: Secondary | ICD-10-CM

## 2021-03-27 LAB — BASIC METABOLIC PANEL
BUN: 24 mg/dL — ABNORMAL HIGH (ref 6–23)
CO2: 33 mEq/L — ABNORMAL HIGH (ref 19–32)
Calcium: 8.8 mg/dL (ref 8.4–10.5)
Chloride: 92 mEq/L — ABNORMAL LOW (ref 96–112)
Creatinine, Ser: 0.97 mg/dL (ref 0.40–1.20)
GFR: 63.28 mL/min (ref >60.00)
Glucose, Bld: 262 mg/dL — ABNORMAL HIGH (ref 70–99)
Potassium: 4.2 mEq/L (ref 3.5–5.1)
Sodium: 136 mEq/L (ref 135–145)

## 2021-03-27 LAB — LIPID PANEL
Cholesterol: 149 mg/dL (ref 0–200)
HDL: 30.9 mg/dL — ABNORMAL LOW (ref >39.00)
LDL Cholesterol: 80 mg/dL (ref 0–99)
NonHDL: 118.16
Total CHOL/HDL Ratio: 5
Triglycerides: 190 mg/dL — ABNORMAL HIGH (ref 0.0–149.0)
VLDL: 38 mg/dL (ref 0.0–40.0)

## 2021-03-27 LAB — TSH: TSH: 1.31 u[IU]/mL (ref 0.35–5.50)

## 2021-03-27 MED ORDER — KETOCONAZOLE 2 % EX CREA: 1.0000 "application " | TOPICAL_CREAM | Freq: Two times a day (BID) | CUTANEOUS | 0 refills | Status: DC

## 2021-03-27 NOTE — Progress Notes (Signed)
Subjective:    Patient ID: Adrienne Bennett, female    DOB: 08/22/1960, 60 y.o.   MRN: [id number]  DOS:  03/27/2021 Type of visit - description: Routine checkup Today with talk about several issues. Had COVID, she feels fully recuperated except for dry mouth , sx started with a diagnosis of COVID.  No eye dryness issues. Noted a rash at the left groin, no itchy. Pt has noted weight loss, that is confirmed. Reports balance issues, had 1 fall, apparently from not been able to elevate her feet enough to maneuver obstacles.  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    Essential hypertension 09/04/2010   Qualifier: Diagnosis of  By: [name] MD, [name].    GANGLION CYST 07/22/2010   Qualifier: Diagnosis of  By: [name] MD, Gailene Youkhana E.    GERD (gastroesophageal reflux disease) 05/23/2017   High cholesterol    Hyperlipidemia 10/12/2012   Hypertension    IBS (irritable bowel syndrome)    chronic Diarrhea   IBS ? 07/16/2008   Qualifier: Diagnosis of  By: [name] MD, [name].    Macular degeneration, dry    Migraine    "long long time ago; maybe 20 yr ago" (09/01/2013)   MIGRAINE HEADACHE 03/13/2010   Qualifier: Diagnosis of   By: [name] MD, Juliah Scadden E.    Mouth sores 04/15/2017   Neck pain 02/03/2011   Osteoarthritis    "back; goes down my right leg" (09/01/2013)   Subclinical hyperthyroidism 02/26/2018   Thyromegaly 12/04/2014   Type II diabetes mellitus (HCC) 2007    Past Surgical History:  Procedure Laterality Date   CARPAL TUNNEL RELEASE Bilateral ~ 2000   TONSILLECTOMY  1970   TUBAL LIGATION  1996    Allergies as of 03/27/2021       Reactions   Benztropine    Dairy Aid [lactase]    Lisinopril    Dc 06-2015, had lip swelling, stomatitis   Penicillins    Monistat 3 Combo Pack App  [miconazole Nitrate Applicator] Itching, Rash        Medication List        Accurate as of March 27, 2021  6:22 PM. If you have any questions, ask your nurse or doctor.          STOP taking these medications    benzonatate 200 MG capsule Commonly known as: TESSALON Stopped by: Willow Ora, MD   HYDROcodone bit-homatropine 5-1.5 MG/5ML syrup Commonly known as: HYCODAN Stopped by: Willow Ora, MD       TAKE these medications    albuterol 108 (90 Base) MCG/ACT inhaler Commonly known as: VENTOLIN HFA Inhale 2 puffs into the  lungs every 6 (six) hours as needed for wheezing or shortness of breath.   amLODipine 10 MG tablet Commonly known as: NORVASC Take 1 tablet (10 mg total) by mouth daily.   aspirin EC 81 MG tablet Take 81 mg by mouth daily. Swallow whole.   atorvastatin 20 MG tablet Commonly known as: LIPITOR TAKE 1 TABLET BY MOUTH  DAILY   clonazePAM 1 MG tablet Commonly known as: KLONOPIN Take 1 mg by mouth 2 (two) times daily. 1 tab AM, 1 tab PM, rx by psychiatry   cyclobenzaprine 10 MG tablet Commonly known as: FLEXERIL Take 1 tablet (10 mg total) by mouth 2 (two) times daily as needed for muscle spasms.   divalproex 250 MG DR tablet Commonly known as: DEPAKOTE Take 250 mg by mouth 2 (two) times daily. 1 tab AM, 2 tabs PM   furosemide 40 MG tablet Commonly known as: LASIX Take 1 tablet (40 mg  total) by mouth 2 (two) times daily.   insulin degludec 200 UNIT/ML FlexTouch Pen Commonly known as: TRESIBA Inject 100 Units into the skin at bedtime. If blood sugar consistently more than 200, increase to 120 units   ketoconazole 2 % cream Commonly known as: NIZORAL Apply 1 application topically 2 (two) times daily. For 10 days Started by: Willow Ora, MD   meloxicam 7.5 MG tablet Commonly known as: MOBIC Take 1 tablet (7.5 mg total) by mouth daily as needed for pain.   metFORMIN 1000 MG (MOD) 24 hr tablet Commonly known as: GLUMETZA Take 1 tablet (1,000 mg total) by mouth 2 (two) times daily with a meal.   metoprolol tartrate 50 MG tablet Commonly known as: LOPRESSOR Take 1 tablet (50 mg total) by mouth 2 (two) times daily.           Objective:   Physical Exam Skin:         Comments: Left groin: Some redness without maceration.   BP 136/82 (BP Location: Left Arm, Patient Position: Sitting, Cuff Size: Normal)   Pulse 88   Temp 97.9 F (36.6 C) (Oral)   Resp 16   Ht 5\' 1"  (1.549 m)   Wt 147 lb 2 oz (66.7 kg)   SpO2 91%   BMI 27.80 kg/m  General:   Well developed, NAD, BMI noted. HEENT:  Normocephalic . Face symmetric, atraumatic. Neck: + Thyromegaly: Left side of the thyroid is slightly enlarged, nontender, not nodular Lungs:  CTA B Normal respiratory effort, no intercostal retractions, no accessory muscle use. Heart: RRR,  no murmur.  Lower extremities: no pretibial edema bilaterally  Skin: Not pale. Not jaundice Neurologic:  alert & oriented X3.  Speech normal, gait unassisted, motor is symmetric, she does have some difficulty transferring. Psych--  Cognition and judgment appear intact.  Cooperative with normal attention span and concentration.  Behavior appropriate. No anxious or depressed appearing.      Assessment     Assessment ENDO: started to see Dr 12/2017 DM  HTN -- dc lisinopril 06-2015>> lips swell x1 , also had stomatitis >>  resolved  High cholesterol Fatty Liver per CT 04-2020 Thyroid disease -Multinodular goiter, thyromegaly w/ dominant nodule: Negative BX 01-2015 - Hyperthyroidism, subclinical, DX 2019 --s/p thyroid ablation per patient 2019 ? IBS.Chronic diarrhea DJD Psychiatry: Sees Dr. 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  Check BMP. CHF with preserved EF: No volume overload, no difficulty breathing, she seems to be doing well. Last visit with cardiology June 8, they recommended medical treatment. Check FLP Goiter: seen on CT 11/28/2020, on chart review, she has left-sided thyromegaly previously bx on 01-2015, BX negative.  On physical exam she still have some thyromegaly, left side, smooth, nontender, nonnodular.  Recommend observation for now.   Rash: Rx Nizoral for 10 days (noted history of allergy to Lotrisone, watch for reaction). Depression, bipolar, PTSD .: We will check Depakote at psychiatry request Weight loss noted: Due to additional diuretics few months ago?  Checking a TSH. Balance issues: I believe is deconditioning, her strength is symmetric but weak.  Offered PT, she will call if interested.  I did recommend her to use a cane to prevent falls. RTC 3 months   This visit occurred during the SARS-CoV-2 public health emergency.  Safety protocols were in place, including screening questions prior to the visit, additional usage of staff PPE, and extensive cleaning of exam room while observing appropriate contact time as indicated for disinfecting solutions.

## 2021-03-27 NOTE — Assessment & Plan Note (Signed)
HTN: Seems well controlled, rec to  continue with amlodipine, Lasix twice daily, metoprolol.  Check BMP. CHF with preserved EF: No volume overload, no difficulty breathing, she seems to be doing well. Last visit with cardiology June 8, they recommended medical treatment. Check FLP Goiter: seen on CT 11/28/2020, on chart review, she has left-sided thyromegaly previously bx on 01-2015, BX negative.  On physical exam she still have some thyromegaly, left side, smooth, nontender, nonnodular.  Recommend observation for now.   Rash: Rx Nizoral for 10 days (noted history of allergy to Lotrisone, watch for reaction). Depression, bipolar, PTSD .: We will check Depakote at psychiatry request Weight loss noted: Due to additional diuretics few months ago?  Checking a TSH. Balance issues: I believe is deconditioning, her strength is symmetric but weak.  Offered PT, she will call if interested.  I did recommend her to use a cane to prevent falls. RTC 3 months

## 2021-03-27 NOTE — Patient Instructions (Addendum)
Recommend to proceed with the following vaccines at your pharmacy: Covid #4 Flu shot this fall  You are overdue for a pap smear and mammogram. We have placed a referral to [facility]. Please call [facility] to schedule an appointment at your earliest convenience if you don't already have one. Their number is [phone].  Take the medications according to the list provided today   Check the  blood pressure weekly.   BP GOAL is between 110/65 and  135/85. If it is consistently higher or lower, let me know  Apply the cream I sent twice daily for 10 days.  If you develop problems with it let me know  GO TO THE LAB : Get the blood work     GO TO THE FRONT DESK, PLEASE SCHEDULE YOUR APPOINTMENTS Come back for a checkup in 3 months

## 2021-03-28 LAB — VALPROIC ACID LEVEL: Valproic Acid Lvl: 50.9 mg/L (ref 50.0–100.0)

## 2021-04-01 ENCOUNTER — Other Ambulatory Visit: Payer: Self-pay | Admitting: Internal Medicine

## 2021-04-01 DIAGNOSIS — K219 Gastro-esophageal reflux disease without esophagitis: Secondary | ICD-10-CM | POA: Diagnosis not present

## 2021-04-01 DIAGNOSIS — R569 Unspecified convulsions: Secondary | ICD-10-CM | POA: Diagnosis not present

## 2021-04-01 DIAGNOSIS — Z9841 Cataract extraction status, right eye: Secondary | ICD-10-CM | POA: Diagnosis not present

## 2021-04-01 DIAGNOSIS — H25812 Combined forms of age-related cataract, left eye: Secondary | ICD-10-CM | POA: Diagnosis not present

## 2021-04-01 DIAGNOSIS — I10 Essential (primary) hypertension: Secondary | ICD-10-CM | POA: Diagnosis not present

## 2021-04-01 DIAGNOSIS — Z961 Presence of intraocular lens: Secondary | ICD-10-CM | POA: Diagnosis not present

## 2021-04-01 DIAGNOSIS — I110 Hypertensive heart disease with heart failure: Secondary | ICD-10-CM | POA: Diagnosis not present

## 2021-04-01 DIAGNOSIS — H02834 Dermatochalasis of left upper eyelid: Secondary | ICD-10-CM | POA: Diagnosis not present

## 2021-04-01 DIAGNOSIS — M1990 Unspecified osteoarthritis, unspecified site: Secondary | ICD-10-CM | POA: Diagnosis not present

## 2021-04-01 DIAGNOSIS — E1036 Type 1 diabetes mellitus with diabetic cataract: Secondary | ICD-10-CM | POA: Diagnosis not present

## 2021-04-01 DIAGNOSIS — I5030 Unspecified diastolic (congestive) heart failure: Secondary | ICD-10-CM | POA: Diagnosis not present

## 2021-04-01 DIAGNOSIS — H52202 Unspecified astigmatism, left eye: Secondary | ICD-10-CM | POA: Diagnosis not present

## 2021-04-01 DIAGNOSIS — E103292 Type 1 diabetes mellitus with mild nonproliferative diabetic retinopathy without macular edema, left eye: Secondary | ICD-10-CM | POA: Diagnosis not present

## 2021-04-01 DIAGNOSIS — H02831 Dermatochalasis of right upper eyelid: Secondary | ICD-10-CM | POA: Diagnosis not present

## 2021-04-03 DIAGNOSIS — H25042 Posterior subcapsular polar age-related cataract, left eye: Secondary | ICD-10-CM | POA: Diagnosis not present

## 2021-04-08 ENCOUNTER — Ambulatory Visit: Payer: Medicare Other | Admitting: Cardiology

## 2021-04-08 ENCOUNTER — Other Ambulatory Visit: Payer: Self-pay

## 2021-04-08 ENCOUNTER — Encounter: Payer: Self-pay | Admitting: Cardiology

## 2021-04-08 VITALS — BP 120/72 | HR 87 | Ht 61.0 in | Wt 146.0 lb

## 2021-04-08 DIAGNOSIS — F3174 Bipolar disorder, in full remission, most recent episode manic: Secondary | ICD-10-CM

## 2021-04-08 DIAGNOSIS — I10 Essential (primary) hypertension: Secondary | ICD-10-CM | POA: Diagnosis not present

## 2021-04-08 DIAGNOSIS — R42 Dizziness and giddiness: Secondary | ICD-10-CM

## 2021-04-08 DIAGNOSIS — I5032 Chronic diastolic (congestive) heart failure: Secondary | ICD-10-CM | POA: Diagnosis not present
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
## 2021-04-08 NOTE — Patient Instructions (Addendum)

## 2021-04-10 IMAGING — CR DG CHEST 2V
2 series · 2 of 2 positions shown · non-contrast
Comparison: 09/23/2017

CLINICAL DATA: Chest pain and diabetes

EXAM:
CHEST - 2 VIEW

[w chest pa]
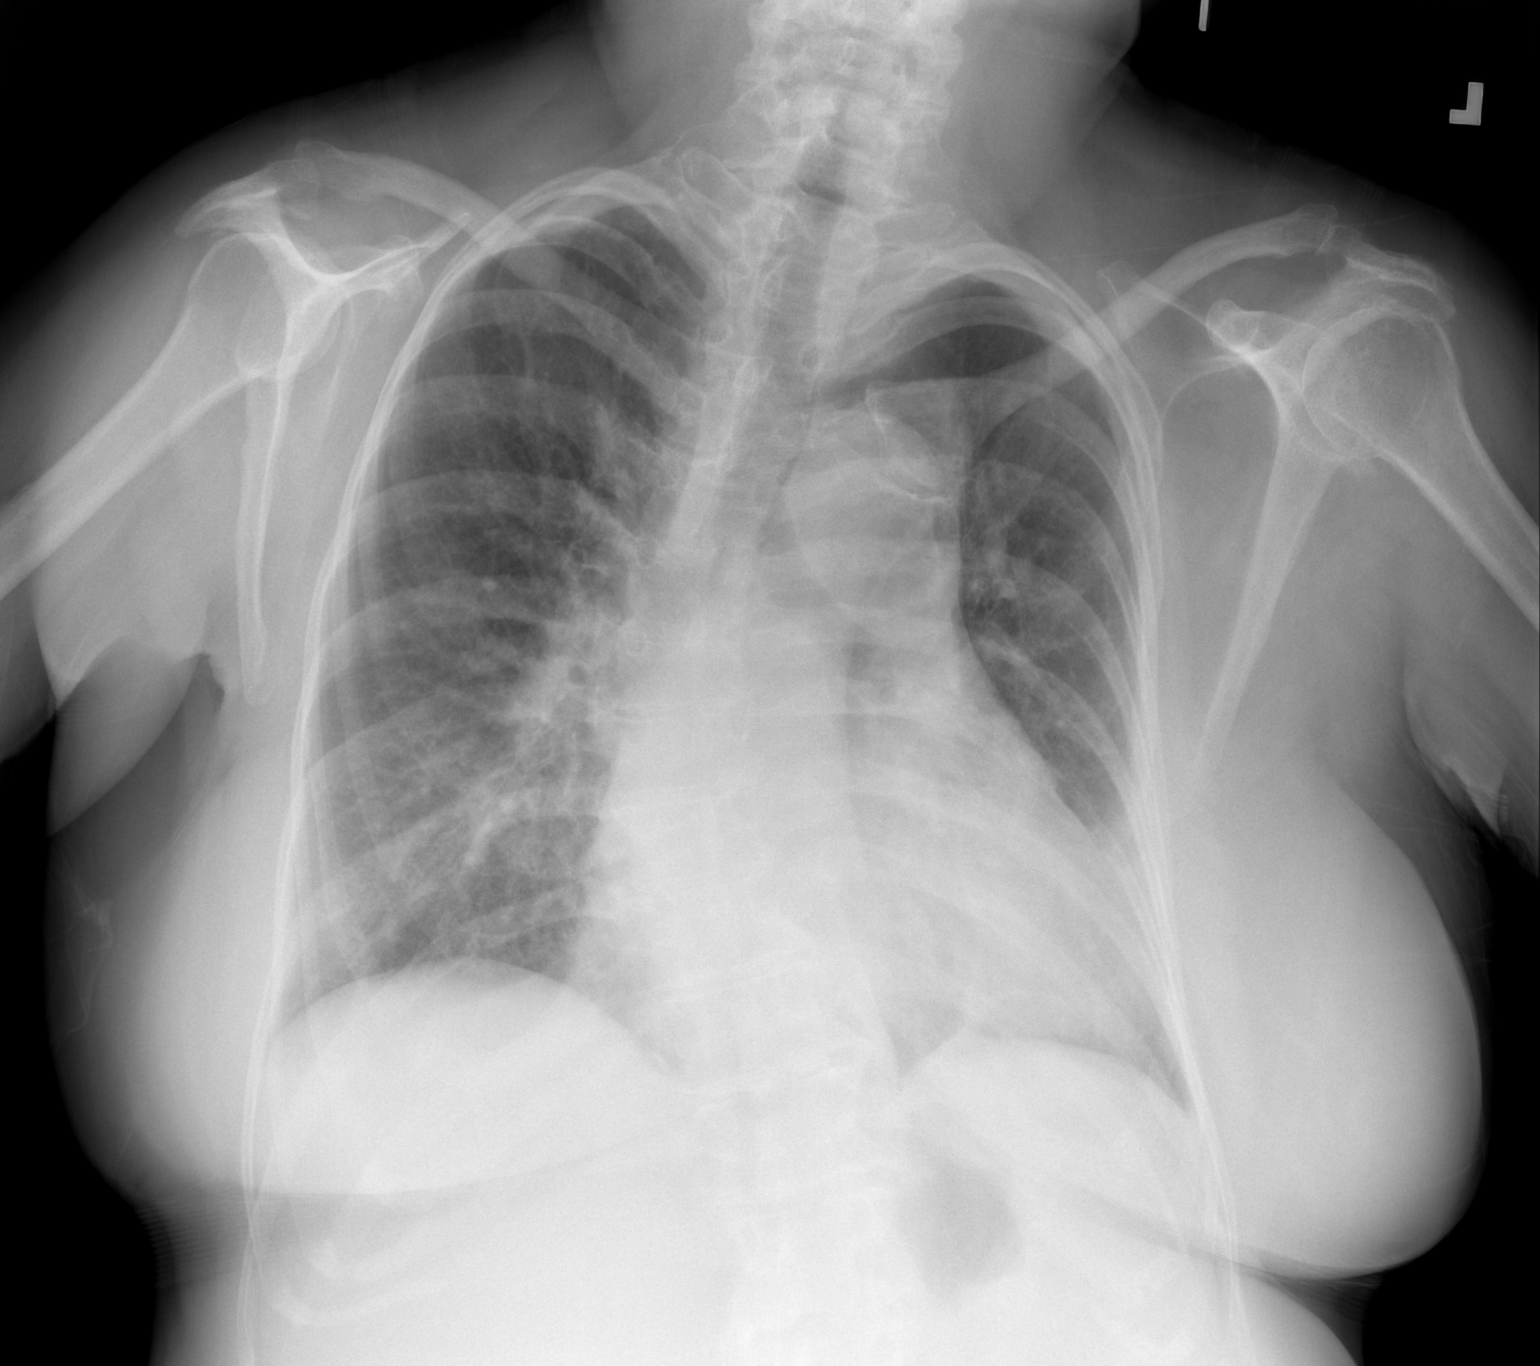

[w chest lat]
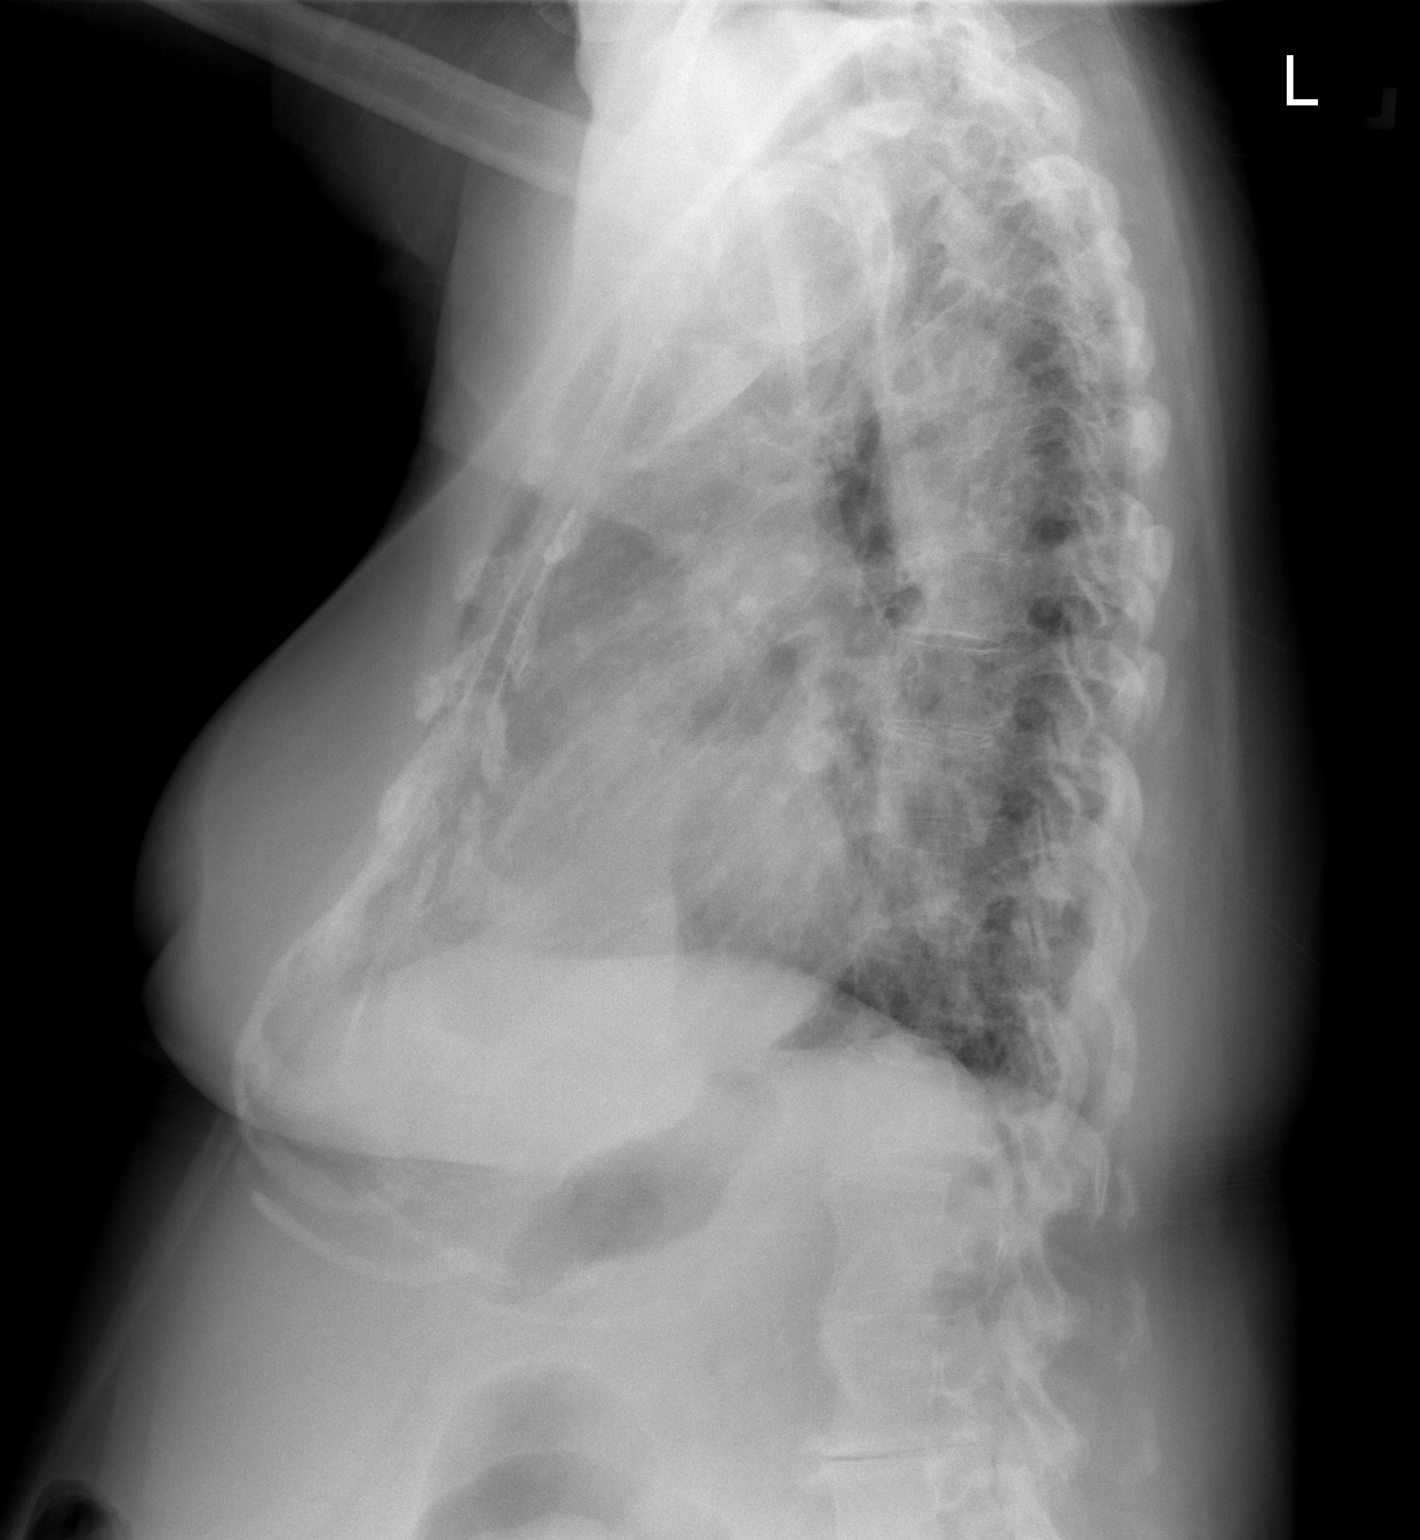

[2 of 2 positions shown; findings below may reference images not displayed]

FINDINGS: Chronic cardiomegaly and wide vascular pedicle accentuated by
rotation and scoliosis. Generalized interstitial coarsening. Lung
markings appear similar to prior. There is no edema, consolidation,
effusion, or pneumothorax.
IMPRESSION: Stable compared to 2953. Prominent interstitial markings without
visible interstitial lung disease at the bases on a 2627 abdominal
CT.

## 2021-04-15 DIAGNOSIS — Z794 Long term (current) use of insulin: Secondary | ICD-10-CM | POA: Diagnosis not present

## 2021-04-15 DIAGNOSIS — E1165 Type 2 diabetes mellitus with hyperglycemia: Secondary | ICD-10-CM | POA: Diagnosis not present

## 2021-04-15 DIAGNOSIS — E042 Nontoxic multinodular goiter: Secondary | ICD-10-CM | POA: Diagnosis not present

## 2021-04-15 DIAGNOSIS — E11319 Type 2 diabetes mellitus with unspecified diabetic retinopathy without macular edema: Secondary | ICD-10-CM | POA: Diagnosis not present

## 2021-04-15 LAB — HEMOGLOBIN A1C: Hemoglobin A1C: 11.3

## 2021-04-23 ENCOUNTER — Other Ambulatory Visit: Payer: Self-pay | Admitting: Internal Medicine

## 2021-05-02 ENCOUNTER — Other Ambulatory Visit: Payer: Self-pay

## 2021-05-02 MED ORDER — FUROSEMIDE 40 MG PO TABS: 40.0000 mg | ORAL_TABLET | Freq: Two times a day (BID) | ORAL | 2 refills | Status: DC

## 2021-05-02 NOTE — Telephone Encounter (Signed)
Refill of Furosemide 40 mg sent to [facility].

## 2021-05-18 ENCOUNTER — Other Ambulatory Visit: Payer: Self-pay | Admitting: Internal Medicine

## 2021-05-20 ENCOUNTER — Encounter: Payer: Self-pay | Admitting: Internal Medicine

## 2021-05-22 ENCOUNTER — Other Ambulatory Visit: Payer: Self-pay

## 2021-05-22 MED ORDER — METOPROLOL TARTRATE 50 MG PO TABS: 50.0000 mg | ORAL_TABLET | Freq: Two times a day (BID) | ORAL | 1 refills | Status: DC

## 2021-06-04 ENCOUNTER — Encounter: Payer: Self-pay | Admitting: Internal Medicine

## 2021-06-04 DIAGNOSIS — R29890 Loss of height: Secondary | ICD-10-CM | POA: Diagnosis not present

## 2021-06-04 DIAGNOSIS — Z1231 Encounter for screening mammogram for malignant neoplasm of breast: Secondary | ICD-10-CM | POA: Diagnosis not present

## 2021-06-04 LAB — HM PAP SMEAR: HM Pap smear: NEGATIVE

## 2021-06-04 LAB — HM MAMMOGRAPHY

## 2021-06-05 ENCOUNTER — Encounter: Payer: Self-pay | Admitting: Internal Medicine

## 2021-06-09 ENCOUNTER — Ambulatory Visit (INDEPENDENT_AMBULATORY_CARE_PROVIDER_SITE_OTHER): Payer: Medicare Other | Admitting: Internal Medicine

## 2021-06-09 ENCOUNTER — Other Ambulatory Visit: Payer: Self-pay

## 2021-06-09 ENCOUNTER — Encounter: Payer: Self-pay | Admitting: Internal Medicine

## 2021-06-09 VITALS — BP 110/60 | HR 84 | Temp 97.8°F | Resp 16 | Ht 60.0 in | Wt 147.6 lb

## 2021-06-09 DIAGNOSIS — Z7185 Encounter for immunization safety counseling: Secondary | ICD-10-CM | POA: Diagnosis not present

## 2021-06-09 DIAGNOSIS — L720 Epidermal cyst: Secondary | ICD-10-CM | POA: Diagnosis not present

## 2021-06-09 DIAGNOSIS — Z23 Encounter for immunization: Secondary | ICD-10-CM

## 2021-06-09 NOTE — Patient Instructions (Signed)
Proceed with your COVID-vaccine at your convenience  If the  finger cyst does not continue improving let me know

## 2021-06-09 NOTE — Progress Notes (Signed)
Subjective:    Patient ID: Adrienne Bennett, female    DOB: 11-Apr-1961, 60 y.o.   MRN: [id number]  DOS:  06/09/2021 Type of visit - description: Acute  About 2-1/2 weeks ago developed lump at the right index finger, she had no pain or discharge. Denies any previous injury. She tried to drain the area with a needle but did not obtain any discharge.  Additionally, we talk about her immunizations.   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    Essential hypertension 09/04/2010   Qualifier: Diagnosis of  By: [name] MD, [name].    GANGLION CYST 07/22/2010   Qualifier: Diagnosis of  By: [name] MD, Nori Poland E.    GERD (gastroesophageal reflux disease) 05/23/2017   High cholesterol    Hyperlipidemia 10/12/2012   Hypertension    IBS (irritable bowel syndrome)    chronic Diarrhea   IBS ? 07/16/2008   Qualifier: Diagnosis of  By: [name] MD, [name].    Macular degeneration, dry    Migraine    "long long time ago; maybe 20 yr ago" (09/01/2013)   MIGRAINE HEADACHE 03/13/2010   Qualifier: Diagnosis of  By: [name] MD, Revis Whalin E.    Mouth sores 04/15/2017   Neck pain 02/03/2011   Osteoarthritis    "back; goes down my right leg" (09/01/2013)   Subclinical hyperthyroidism 02/26/2018   Thyromegaly 12/04/2014   Type II diabetes mellitus (HCC) 2007    Past Surgical History:  Procedure Laterality Date   CARPAL TUNNEL RELEASE  Bilateral ~ 2000   TONSILLECTOMY  1970   TUBAL LIGATION  1996    Allergies as of 06/09/2021       Reactions   Benztropine    Dairy Aid [tilactase]    Lisinopril    Dc 06-2015, had lip swelling, stomatitis   Penicillins    Monistat 3 Combo Pack App  [miconazole Nitrate Applicator] Itching, Rash        Medication List        Accurate as of June 09, 2021  4:17 PM. If you have any questions, ask your nurse or doctor.          albuterol 108 (90 Base) MCG/ACT inhaler Commonly known as: VENTOLIN HFA Inhale 2 puffs into the lungs every 6 (six) hours as needed for wheezing or shortness of breath.   amLODipine 10 MG tablet Commonly known as: NORVASC TAKE 1 TABLET BY MOUTH  DAILY   aspirin EC 81 MG tablet Take 81 mg by mouth daily. Swallow whole.   atorvastatin 20 MG tablet Commonly known as: LIPITOR TAKE 1 TABLET BY MOUTH  DAILY   clonazePAM 1 MG tablet Commonly known as: KLONOPIN Take 1 mg by mouth 2 (two) times daily. 1 tab AM, 1 tab PM, rx by psychiatry   cyclobenzaprine 10 MG tablet Commonly known as: FLEXERIL TAKE 1 TABLET BY MOUTH  TWICE DAILY AS NEEDED  FOR  MUSCLE SPASM(S)   divalproex 250 MG DR tablet Commonly known as: DEPAKOTE Take 250 mg by mouth 2 (two) times daily. 1 tab AM, 2 tabs PM   furosemide 40 MG tablet Commonly known as: LASIX Take 1 tablet (40 mg total) by mouth 2 (two) times daily.   insulin degludec 200 UNIT/ML FlexTouch Pen Commonly known as: TRESIBA Inject 100 Units into the skin at bedtime. If blood sugar consistently more than 200, increase to 120 units   ketoconazole 2 % cream Commonly known as: NIZORAL Apply 1 application topically 2 (two) times daily. For 10 days   meloxicam 7.5 MG tablet Commonly known as: MOBIC TAKE 1 TABLET BY MOUTH  DAILY AS NEEDED FOR PAIN What changed:  when to take this additional instructions   metFORMIN 1000 MG (MOD) 24 hr tablet Commonly known as: GLUMETZA Take 1 tablet (1,000 mg total) by  mouth 2 (two) times daily with a meal.   metoprolol tartrate 50 MG tablet Commonly known as: LOPRESSOR Take 1 tablet (50 mg total) by mouth 2 (two) times daily.           Objective:   Physical Exam BP 110/60   Pulse 84   Temp 97.8 F (36.6 C)   Resp 16   Ht 5' (1.524 m)   Wt 147 lb 9.6 oz (67 kg)   SpO2 97%   BMI 28.83 kg/m  General:   Well developed, NAD, BMI noted. HEENT:  Normocephalic . Face symmetric, atraumatic Upper extremities: Hands with bony changes consistent with DJD, R index: Has a firm lump, see picture.  No redness, warmness on TTP. Lower extremities: no pretibial edema bilaterally  Skin: Not pale. Not jaundice Neurologic:  alert & oriented X3.  Speech normal, gait appropriate for age and unassisted Psych--  Cognition and judgment appear intact.  Cooperative with normal attention span and concentration.  Behavior appropriate. No anxious or depressed appearing.       Assessment     Assessment ENDO: started to see Dr Morrison Old 12/2017 DM  HTN -- dc lisinopril 06-2015>> lips swell x1 , also had stomatitis >> resolved  High cholesterol Fatty Liver per CT 04-2020 Thyroid disease -Multinodular goiter, thyromegaly w/ dominant nodule: Negative BX 01-2015 - Hyperthyroidism, subclinical, DX 2019 --s/p thyroid ablation per patient 2019 ? IBS.Chronic diarrhea DJD Psychiatry: Sees Dr. 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  It is actually getting better, thus recommend observation.  Call if not gradually better. Preventive care: Flu shot today Recommend COVID-vaccine, she is somewhat hesitant, pros and cons discussed.  She will proceed.    This visit occurred during the SARS-CoV-2 public health emergency.  Safety protocols  were in place, including screening questions prior to the visit, additional usage of staff PPE, and extensive cleaning of exam room while observing appropriate contact time as indicated for disinfecting solutions.

## 2021-06-10 NOTE — Assessment & Plan Note (Signed)
Cyst: Cyst located on the R index finger, synovial versus others.  It is actually getting better, thus recommend observation.  Call if not gradually better. Preventive care: Flu shot today Recommend COVID-vaccine, she is somewhat hesitant, pros and cons discussed.  She will proceed.

## 2021-06-13 ENCOUNTER — Encounter: Payer: Self-pay | Admitting: Internal Medicine

## 2021-06-26 ENCOUNTER — Encounter: Payer: Self-pay | Admitting: Internal Medicine

## 2021-06-26 ENCOUNTER — Ambulatory Visit (INDEPENDENT_AMBULATORY_CARE_PROVIDER_SITE_OTHER): Payer: Medicare Other | Admitting: Internal Medicine

## 2021-06-26 VITALS — BP 136/82 | HR 76 | Temp 98.2°F | Resp 18 | Ht 60.0 in | Wt 148.4 lb

## 2021-06-26 DIAGNOSIS — Z794 Long term (current) use of insulin: Secondary | ICD-10-CM

## 2021-06-26 DIAGNOSIS — I5032 Chronic diastolic (congestive) heart failure: Secondary | ICD-10-CM | POA: Diagnosis not present

## 2021-06-26 DIAGNOSIS — E1165 Type 2 diabetes mellitus with hyperglycemia: Secondary | ICD-10-CM | POA: Diagnosis not present

## 2021-06-26 DIAGNOSIS — I10 Essential (primary) hypertension: Secondary | ICD-10-CM

## 2021-06-26 DIAGNOSIS — E785 Hyperlipidemia, unspecified: Secondary | ICD-10-CM

## 2021-06-26 DIAGNOSIS — E1169 Type 2 diabetes mellitus with other specified complication: Secondary | ICD-10-CM

## 2021-06-26 LAB — BASIC METABOLIC PANEL
BUN: 15 mg/dL (ref 6–23)
CO2: 38 mEq/L — ABNORMAL HIGH (ref 19–32)
Calcium: 9.4 mg/dL (ref 8.4–10.5)
Chloride: 93 mEq/L — ABNORMAL LOW (ref 96–112)
Creatinine, Ser: 0.95 mg/dL (ref 0.40–1.20)
GFR: 64.77 mL/min (ref >60.00)
Glucose, Bld: 313 mg/dL — ABNORMAL HIGH (ref 70–99)
Potassium: 4.5 mEq/L (ref 3.5–5.1)
Sodium: 135 mEq/L (ref 135–145)

## 2021-06-26 NOTE — Patient Instructions (Signed)
Check the  blood pressure 2 or 3 times a month   BP GOAL is between 110/65 and  135/85. If it is consistently higher or lower, let me know     GO TO THE LAB : Get the blood work     GO TO THE FRONT DESK, PLEASE SCHEDULE YOUR APPOINTMENTS Come back for a physical exam by April 2023

## 2021-06-26 NOTE — Progress Notes (Signed)
Subjective:    Patient ID: Adrienne Bennett, female    DOB: April 11, 1961, 60 y.o.   MRN: [id number]  DOS:  06/26/2021 Type of visit - description: Routine follow-up  No new concerns. She admits to a lot of stress related to taking care of her mother. Last hemoglobin A1c was quite elevated, admits that due to stress she is not eating right.   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    Essential hypertension 09/04/2010   Qualifier: Diagnosis of  By: [name] MD, [name].    GANGLION CYST 07/22/2010   Qualifier: Diagnosis of  By: [name] MD, Khamron Gellert E.    GERD (gastroesophageal reflux disease) 05/23/2017   High cholesterol    Hyperlipidemia 10/12/2012   Hypertension    IBS (irritable bowel syndrome)    chronic Diarrhea   IBS ? 07/16/2008   Qualifier: Diagnosis of  By: [name] MD, [name].    Macular degeneration, dry    Migraine    "long long time ago; maybe 20 yr ago" (09/01/2013)   MIGRAINE HEADACHE 03/13/2010   Qualifier: Diagnosis of  By: [name] MD, Dalan Cowger E.    Mouth sores 04/15/2017   Neck pain 02/03/2011   Osteoarthritis    "back; goes down my right leg" (09/01/2013)   Subclinical hyperthyroidism 02/26/2018   Thyromegaly 12/04/2014   Type II diabetes mellitus (HCC) 2007    Past Surgical History:  Procedure Laterality Date   CARPAL TUNNEL RELEASE Bilateral ~ 2000   TONSILLECTOMY  1970   TUBAL LIGATION  1996     Allergies as of 06/26/2021       Reactions   Benztropine    Dairy Aid [tilactase]    Lisinopril    Dc 06-2015, had lip swelling, stomatitis   Penicillins    Monistat 3 Combo Pack App  [miconazole Nitrate Applicator] Itching, Rash        Medication List        Accurate as of June 26, 2021  5:33 PM. If you have any questions, ask your nurse or doctor.          albuterol 108 (90 Base) MCG/ACT inhaler Commonly known as: VENTOLIN HFA Inhale 2 puffs into the lungs every 6 (six) hours as needed for wheezing or shortness of breath.   amLODipine 10 MG tablet Commonly known as: NORVASC TAKE 1 TABLET BY MOUTH  DAILY   aspirin EC 81 MG tablet Take 81 mg by mouth daily. Swallow whole.   atorvastatin 20 MG tablet Commonly known as: LIPITOR TAKE 1 TABLET BY MOUTH  DAILY   clonazePAM 1 MG tablet Commonly known as: KLONOPIN Take 1 mg by mouth 2 (two) times daily. 1 tab AM, 1 tab PM, rx by psychiatry   cyclobenzaprine 10 MG tablet Commonly known as: FLEXERIL TAKE 1 TABLET BY MOUTH  TWICE DAILY AS NEEDED FOR  MUSCLE SPASM(S)   divalproex 250 MG  DR tablet Commonly known as: DEPAKOTE Take 250 mg by mouth 2 (two) times daily. 1 tab AM, 2 tabs PM   furosemide 40 MG tablet Commonly known as: LASIX Take 1 tablet (40 mg total) by mouth 2 (two) times daily.   insulin degludec 200 UNIT/ML FlexTouch Pen Commonly known as: TRESIBA Inject 100 Units into the skin at bedtime. If blood sugar consistently more than 200, increase to 120 units   ketoconazole 2 % cream Commonly known as: NIZORAL Apply 1 application topically 2 (two) times daily. For 10 days   meloxicam 7.5 MG tablet Commonly known as: MOBIC TAKE 1 TABLET BY MOUTH  DAILY AS NEEDED FOR PAIN What changed:  when to take this additional instructions   metFORMIN 1000 MG (MOD) 24 hr tablet Commonly known as: GLUMETZA Take 1 tablet (1,000 mg total) by mouth 2 (two) times daily with a meal.   metoprolol tartrate 50  MG tablet Commonly known as: LOPRESSOR Take 1 tablet (50 mg total) by mouth 2 (two) times daily.           Objective:   Physical Exam BP 136/82 (BP Location: Left Arm, Patient Position: Sitting, Cuff Size: Small)   Pulse 76   Temp 98.2 F (36.8 C) (Oral)   Resp 18   Ht 5' (1.524 m)   Wt 148 lb 6 oz (67.3 kg)   SpO2 98%   BMI 28.98 kg/m  General:   Well developed, NAD, BMI noted. HEENT:  Normocephalic . Face symmetric, atraumatic Lungs:  CTA B Normal respiratory effort, no intercostal retractions, no accessory muscle use. Heart: RRR,  no murmur.  DM foot exam: No edema, good pulses, pinprick examination normal Skin: Not pale. Not jaundice Neurologic:  alert & oriented X3.  Speech normal, gait appropriate for age and unassisted Psych--  Cognition and judgment appear intact.  Cooperative with normal attention span and concentration.  Behavior appropriate. No anxious or depressed appearing.      Assessment     Assessment ENDO: started to see Dr Morrison Old 12/2017 DM  HTN -- dc lisinopril 06-2015>> lips swell x1 , also had stomatitis >> resolved  High cholesterol Fatty Liver per CT 04-2020 Thyroid disease -Multinodular goiter, thyromegaly w/ dominant nodule: Negative BX 01-2015 - Hyperthyroidism, subclinical, DX 2019 --s/p thyroid ablation per patient 2019 ? IBS.Chronic diarrhea DJD Psychiatry: Sees Dr. 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 Foot exam negative. HTN: Seems controlled, continue present care, check BMP. CHF Saw cardiology 04/08/2021, felt to be stable.  No edema Preventive care: Had a flu shot, recommend a COVID booster. RTC CPX April 2023   This  visit occurred during the SARS-CoV-2 public health emergency.  Safety protocols were in place, including screening questions prior to the visit, additional usage of staff PPE, and extensive cleaning of exam room while observing appropriate contact time as indicated for disinfecting solutions.

## 2021-06-26 NOTE — Assessment & Plan Note (Signed)
DM: Poorly controlled, sees Endo, admits to a poor diet due to stress, I attempted to address the issue, she asked me to change subject. Foot exam negative. HTN: Seems controlled, continue present care, check BMP. CHF Saw cardiology 04/08/2021, felt to be stable.  No edema Preventive care: Had a flu shot, recommend a COVID booster. RTC CPX April 2023

## 2021-07-16 DIAGNOSIS — E113293 Type 2 diabetes mellitus with mild nonproliferative diabetic retinopathy without macular edema, bilateral: Secondary | ICD-10-CM | POA: Diagnosis not present

## 2021-07-16 DIAGNOSIS — E042 Nontoxic multinodular goiter: Secondary | ICD-10-CM | POA: Diagnosis not present

## 2021-07-16 DIAGNOSIS — Z794 Long term (current) use of insulin: Secondary | ICD-10-CM | POA: Diagnosis not present

## 2021-07-23 ENCOUNTER — Other Ambulatory Visit: Payer: Self-pay | Admitting: Internal Medicine

## 2021-09-16 ENCOUNTER — Encounter: Payer: Self-pay | Admitting: Cardiology

## 2021-09-18 ENCOUNTER — Other Ambulatory Visit: Payer: Self-pay | Admitting: Internal Medicine

## 2021-09-24 ENCOUNTER — Other Ambulatory Visit: Payer: Self-pay | Admitting: Internal Medicine

## 2021-10-08 DIAGNOSIS — H26493 Other secondary cataract, bilateral: Secondary | ICD-10-CM | POA: Diagnosis not present

## 2021-10-14 ENCOUNTER — Other Ambulatory Visit: Payer: Self-pay

## 2021-10-14 ENCOUNTER — Ambulatory Visit: Payer: Medicare Other | Admitting: Cardiology

## 2021-10-14 ENCOUNTER — Encounter: Payer: Self-pay | Admitting: Cardiology

## 2021-10-14 VITALS — BP 138/94 | HR 85 | Ht 61.0 in | Wt 149.0 lb

## 2021-10-14 DIAGNOSIS — I5032 Chronic diastolic (congestive) heart failure: Secondary | ICD-10-CM | POA: Diagnosis not present

## 2021-10-14 DIAGNOSIS — I10 Essential (primary) hypertension: Secondary | ICD-10-CM

## 2021-10-14 DIAGNOSIS — E7800 Pure hypercholesterolemia, unspecified: Secondary | ICD-10-CM | POA: Diagnosis not present

## 2021-10-14 NOTE — Patient Instructions (Signed)
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
## 2021-10-22 DIAGNOSIS — E042 Nontoxic multinodular goiter: Secondary | ICD-10-CM | POA: Diagnosis not present

## 2021-10-22 DIAGNOSIS — E113293 Type 2 diabetes mellitus with mild nonproliferative diabetic retinopathy without macular edema, bilateral: Secondary | ICD-10-CM | POA: Diagnosis not present

## 2021-10-22 DIAGNOSIS — Z794 Long term (current) use of insulin: Secondary | ICD-10-CM | POA: Diagnosis not present

## 2021-10-22 LAB — HEMOGLOBIN A1C: Hemoglobin A1C: 9.5

## 2021-10-23 ENCOUNTER — Encounter: Payer: Self-pay | Admitting: Internal Medicine

## 2021-10-27 ENCOUNTER — Ambulatory Visit (INDEPENDENT_AMBULATORY_CARE_PROVIDER_SITE_OTHER): Payer: Medicare Other | Admitting: Internal Medicine

## 2021-10-27 ENCOUNTER — Encounter: Payer: Self-pay | Admitting: Internal Medicine

## 2021-10-27 VITALS — BP 126/78 | HR 81 | Temp 98.2°F | Resp 18 | Ht 61.0 in | Wt 149.2 lb

## 2021-10-27 DIAGNOSIS — E785 Hyperlipidemia, unspecified: Secondary | ICD-10-CM | POA: Diagnosis not present

## 2021-10-27 DIAGNOSIS — E1169 Type 2 diabetes mellitus with other specified complication: Secondary | ICD-10-CM

## 2021-10-27 DIAGNOSIS — G629 Polyneuropathy, unspecified: Secondary | ICD-10-CM

## 2021-10-27 DIAGNOSIS — I10 Essential (primary) hypertension: Secondary | ICD-10-CM | POA: Diagnosis not present

## 2021-10-27 DIAGNOSIS — Z1211 Encounter for screening for malignant neoplasm of colon: Secondary | ICD-10-CM

## 2021-10-27 DIAGNOSIS — K589 Irritable bowel syndrome without diarrhea: Secondary | ICD-10-CM

## 2021-10-27 DIAGNOSIS — Z0001 Encounter for general adult medical examination with abnormal findings: Secondary | ICD-10-CM

## 2021-10-27 DIAGNOSIS — Z0000 Encounter for general adult medical examination without abnormal findings: Secondary | ICD-10-CM | POA: Diagnosis not present

## 2021-10-27 LAB — CBC WITH DIFFERENTIAL/PLATELET
Basophils Absolute: 0 10*3/uL (ref 0.0–0.1)
Basophils Relative: 0.6 % (ref 0.0–3.0)
Eosinophils Absolute: 0.2 10*3/uL (ref 0.0–0.7)
Eosinophils Relative: 2.6 % (ref 0.0–5.0)
HCT: 36.2 % (ref 36.0–46.0)
Hemoglobin: 12.1 g/dL (ref 12.0–15.0)
Lymphocytes Relative: 31.2 % (ref 12.0–46.0)
Lymphs Abs: 2.1 10*3/uL (ref 0.7–4.0)
MCHC: 33.4 g/dL (ref 30.0–36.0)
MCV: 91.7 fl (ref 78.0–100.0)
Monocytes Absolute: 0.5 10*3/uL (ref 0.1–1.0)
Monocytes Relative: 7.9 % (ref 3.0–12.0)
Neutro Abs: 4 10*3/uL (ref 1.4–7.7)
Neutrophils Relative %: 57.7 % (ref 43.0–77.0)
Platelets: 258 10*3/uL (ref 150.0–400.0)
RBC: 3.94 Mil/uL (ref 3.87–5.11)
RDW: 13.5 % (ref 11.5–15.5)
WBC: 6.9 10*3/uL (ref 4.0–10.5)

## 2021-10-27 LAB — B12 AND FOLATE PANEL
Folate: >24.2 ng/mL (ref >5.9)
Vitamin B-12: 345 pg/mL (ref 211–911)

## 2021-10-27 LAB — VITAMIN D 25 HYDROXY (VIT D DEFICIENCY, FRACTURES): VITD: 13.27 ng/mL — ABNORMAL LOW (ref 30.00–100.00)

## 2021-10-27 MED ORDER — ATORVASTATIN CALCIUM 40 MG PO TABS: 40.0000 mg | ORAL_TABLET | Freq: Every day | ORAL | 1 refills | Status: DC

## 2021-10-27 NOTE — Assessment & Plan Note (Signed)
-  Tdap: 2013 ?- prevnar: 2016 ?- PNM 23: 2010, 2019 ?- s/p shingrex x 2  ?-Covid vax booster is an option, declined  ?-CCS:  ?Colonoscopy 07-2010, Dr. Oletta Lamas, had polyps;  states she "would never do that again". See comments from 2017 . ?iFOB neg 2017, 2018; did not return a cologuard 2021 ("Im afraid it will be + then I need a Cscope"). ?Again explained benefits of early cancer detection.  Elected to see GI (I suspect she likes to d/w them IBS more so than a Cscope)   ? - last PAP MMG 05-2021 (K PN) ?- bones : DEXA at gyn  (per pt), on a MVI.  Checking vitamin D ?-Diet and exercise: Counseled ?+ FH CAD:on ASA  ?- Available labs reviewed: Recent CMP okay except for AST of 54.  Normal TSH.  LDL was 114. Will check CBC, vital D, 123456, folic acid ?- ACP info provided  ?  ?

## 2021-10-27 NOTE — Patient Instructions (Signed)
Let me know if you would like to see neurology regards the leg numbness when you walk ? ?Increase atorvastatin to 40 mg daily ? ? ?GO TO THE LAB : Get the blood work   ? ? ?GO TO THE FRONT DESK, PLEASE SCHEDULE YOUR APPOINTMENTS ?Come back for a checkup in 4 months ? ?"Living will", "Health Care Power of attorney": Advanced care planning ? ?(If you already have a living will or healthcare power of attorney, please bring the copy to be scanned in your chart.) ? ?Advance care planning is a process that supports adults in  understanding and sharing their preferences regarding future medical care.  ? ?The patient's preferences are recorded in documents called Advance Directives.    ?Advanced directives are completed (and can be modified at any time) while the patient is in full mental capacity.  ? ?The documentation should be available at all times to the patient, the family and the healthcare providers.  ?Bring in a copy to be scanned in your chart is an excellent idea and is recommended  ? ?This legal documents direct treatment decision making and/or appoint a surrogate to make the decision if the patient is not capable to do so.  ? ? ?Advance directives can be documented in many types of formats,  documents have names such as:  ?Lliving will  ?Durable power of attorney for healthcare (healthcare proxy or healthcare power of attorney)  ?Combined directives  ?Physician orders for life-sustaining treatment  ?  ?More information at: ? ?[email or web address]  ?

## 2021-10-27 NOTE — Assessment & Plan Note (Signed)
Here for CPX ?DM: Last A1c 11.3 (September 2022, K PN).  Follow-up by Endo ?Thyroid disease: per Endo, last TSH 2.6 few days ago ?High cholesterol: LDL 114 few days ago (Care Everywhere) , on Lipitor 20 mg, increased to 40 mg daily. ?IBS, fatty liver per CT. ?Continue with symptoms, mostly diarrhea, she is also unable to feel BM coming thus uses diapers.  Has not seen GI (Dr. Randa Evens) in years.  Offered a referral. Will arrange (to [facility], Dr Randa Evens retired) ?Depression bipolar PTSD: Per psychiatry. ?CHF with preserved EF: Saw cardiology 10/14/2021.  Felt to be stable ?Paresthesias, lower extremities: Foot exam negative, sxs not classic for DM neuropathy.  Perhaps neurogenic claudication?  Offered neurology referral- declined .  Check vitamin levels. ?DJD: On meloxicam daily. Seems to tolerate well  ?RTC 4 months ?
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
## 2021-10-30 MED ORDER — VITAMIN D (ERGOCALCIFEROL) 1.25 MG (50000 UNIT) PO CAPS: 50000.0000 [IU] | ORAL_CAPSULE | ORAL | 0 refills | Status: DC

## 2021-10-30 NOTE — Addendum Note (Signed)
Addended byConrad [place] D on: 10/30/2021 09:45 AM ? ? Modules accepted: Orders ? ?

## 2021-11-01 ENCOUNTER — Encounter: Payer: Self-pay | Admitting: Internal Medicine

## 2021-11-18 ENCOUNTER — Other Ambulatory Visit: Payer: Self-pay | Admitting: Internal Medicine

## 2021-12-22 ENCOUNTER — Encounter: Payer: Self-pay | Admitting: Internal Medicine

## 2021-12-23 ENCOUNTER — Telehealth: Payer: Self-pay | Admitting: Internal Medicine

## 2021-12-23 NOTE — Telephone Encounter (Signed)
Opened in error

## 2021-12-23 NOTE — Telephone Encounter (Signed)
Initial Comment Caller says that she has had bad headache for 11 days and has very loud ringing in her ears. Her heart feels like it is beating stronger. Her heart was beating out of her chest last night. No chest pain. Translation No Disp. Time Eilene Ghazi Time) Disposition Final User 12/23/2021 10:26:30 AM Attempt made - no message left Radford Pax, RN, Eugene Garnet 12/23/2021 10:42:11 AM Attempt made - message left Radford Pax, RN, Eugene Garnet 12/23/2021 10:54:36 AM FINAL ATTEMPT MADE - message left Yes Turner, RN, Eugene Garnet

## 2021-12-23 NOTE — Telephone Encounter (Signed)
Pt called back regarding missed call. Informed pt triage services has been trying to contact her but has been unable to reach her regarding symptoms. Pt stated she does not want to speak with them. Advised pt paz's schedule is booked this week. Pt states she does not want to see anyone else within the office or visit the emergency department. Pt informed me she will visit them if things progressively get worse.

## 2021-12-23 NOTE — Telephone Encounter (Signed)
Advised pt all providers are fully booked today. She stated she hears ringing in her ear and her heart has been racing. Transferred to triage.

## 2021-12-25 ENCOUNTER — Ambulatory Visit (INDEPENDENT_AMBULATORY_CARE_PROVIDER_SITE_OTHER): Payer: Medicare Other | Admitting: Internal Medicine

## 2021-12-25 ENCOUNTER — Encounter: Payer: Self-pay | Admitting: Internal Medicine

## 2021-12-25 ENCOUNTER — Ambulatory Visit (HOSPITAL_BASED_OUTPATIENT_CLINIC_OR_DEPARTMENT_OTHER)
Admission: RE | Admit: 2021-12-25 | Discharge: 2021-12-25 | Disposition: A | Discharge status: Home / Self Care | Payer: Medicare Other | Source: Ambulatory Visit | Attending: Internal Medicine | Admitting: Internal Medicine

## 2021-12-25 VITALS — BP 162/90 | HR 87 | Temp 98.0°F | Resp 16 | Ht 61.0 in | Wt 151.2 lb

## 2021-12-25 DIAGNOSIS — F32A Depression, unspecified: Secondary | ICD-10-CM

## 2021-12-25 DIAGNOSIS — G44001 Cluster headache syndrome, unspecified, intractable: Secondary | ICD-10-CM

## 2021-12-25 DIAGNOSIS — R519 Headache, unspecified: Secondary | ICD-10-CM | POA: Diagnosis not present

## 2021-12-25 DIAGNOSIS — R002 Palpitations: Secondary | ICD-10-CM

## 2021-12-25 DIAGNOSIS — I10 Essential (primary) hypertension: Secondary | ICD-10-CM

## 2021-12-25 LAB — BASIC METABOLIC PANEL
BUN: 22 mg/dL (ref 6–23)
CO2: 36 mEq/L — ABNORMAL HIGH (ref 19–32)
Calcium: 9.7 mg/dL (ref 8.4–10.5)
Chloride: 94 mEq/L — ABNORMAL LOW (ref 96–112)
Creatinine, Ser: 1.04 mg/dL (ref 0.40–1.20)
GFR: 57.9 mL/min — ABNORMAL LOW (ref >60.00)
Glucose, Bld: 153 mg/dL — ABNORMAL HIGH (ref 70–99)
Potassium: 4.7 mEq/L (ref 3.5–5.1)
Sodium: 139 mEq/L (ref 135–145)

## 2021-12-25 LAB — CBC WITH DIFFERENTIAL/PLATELET
Basophils Absolute: 0 10*3/uL (ref 0.0–0.1)
Basophils Relative: 0.5 % (ref 0.0–3.0)
Eosinophils Absolute: 0.1 10*3/uL (ref 0.0–0.7)
Eosinophils Relative: 1.7 % (ref 0.0–5.0)
HCT: 38.7 % (ref 36.0–46.0)
Hemoglobin: 12.6 g/dL (ref 12.0–15.0)
Lymphocytes Relative: 28.2 % (ref 12.0–46.0)
Lymphs Abs: 1.7 10*3/uL (ref 0.7–4.0)
MCHC: 32.5 g/dL (ref 30.0–36.0)
MCV: 92.7 fl (ref 78.0–100.0)
Monocytes Absolute: 0.5 10*3/uL (ref 0.1–1.0)
Monocytes Relative: 7.9 % (ref 3.0–12.0)
Neutro Abs: 3.7 10*3/uL (ref 1.4–7.7)
Neutrophils Relative %: 61.7 % (ref 43.0–77.0)
Platelets: 199 10*3/uL (ref 150.0–400.0)
RBC: 4.17 Mil/uL (ref 3.87–5.11)
RDW: 14.9 % (ref 11.5–15.5)
WBC: 6 10*3/uL (ref 4.0–10.5)

## 2021-12-25 LAB — SEDIMENTATION RATE: Sed Rate: 13 mm/hr (ref 0–30)

## 2021-12-25 LAB — TSH: TSH: 1.84 u[IU]/mL (ref 0.35–5.50)

## 2021-12-25 MED ORDER — TIZANIDINE HCL 2 MG PO CAPS: 2.0000 mg | ORAL_CAPSULE | Freq: Three times a day (TID) | ORAL | 0 refills | Status: DC

## 2021-12-25 NOTE — Patient Instructions (Addendum)
Your blood pressure is high Increase metoprolol 50 mg to 1.5 tablets twice daily for now.  Check the  blood pressure regularly BP GOAL is between 110/65 and  135/85. If it is consistently higher or lower, let me know   Hold cyclobenzaprine. Try tizanidine for headaches. You can also take Tylenol 500 mg 2 tablets every 8 hours. Go to the ER if the headache is ongoing or gets worse.  Reach out to your counselor about stress.  GO TO THE LAB : Get the blood work     GO TO THE FRONT DESK, PLEASE SCHEDULE YOUR APPOINTMENTS Come back for a checkup in 2 weeks     STOP BY THE FIRST FLOOR: Get your CT of the head

## 2021-12-25 NOTE — Progress Notes (Unsigned)
Subjective:    Patient ID: Adrienne Bennett, female    DOB: Oct 30, 1960, 61 y.o.   MRN: [id number]  DOS:  12/25/2021 Type of visit - description: Acute visit, refused to go to the ER as recommended.   Her main concern today is headache. Started 12 days ago, is stated 24/7. Described as hot, sharp hot iron that goes from 1 temple to the other. Tylenol, Advil and aspirin have not helped. No associated nausea or vomiting.  No light intolerance. Denies fever or chills. No major sinus congestion. No neck pain or stiffness. No rash. No dizziness, slurred speech or motor deficits for She did have something similar many years ago, not the worst headache of her life. She did report significant increase of his stress in the last several days.  Denies any suicidal ideas.  Also, last week was having dinner with her husband and developed palpitations, tinnitus, has not recommended ER visit but she declined it. Since then is having episodic palpitations but no associated chest pain or difficulty breathing. Denies any edema.   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    Essential hypertension 09/04/2010   Qualifier: Diagnosis of  By: Drue NovelPaz MD, Nolon RodJose E.    GANGLION CYST 07/22/2010   Qualifier: Diagnosis of  By: Drue NovelPaz MD, Teena Mangus E.    GERD (gastroesophageal reflux disease) 05/23/2017   High cholesterol     Hyperlipidemia 10/12/2012   Hypertension    IBS (irritable bowel syndrome)    chronic Diarrhea   IBS ? 07/16/2008   Qualifier: Diagnosis of  By: Drue NovelPaz MD, Nolon RodJose E.    Macular degeneration, dry    Migraine    "long long time ago; maybe 20 yr ago" (09/01/2013)   MIGRAINE HEADACHE 03/13/2010   Qualifier: Diagnosis of  By: Drue NovelPaz MD, Briseidy Spark E.    Mouth sores 04/15/2017   Neck pain 02/03/2011   Osteoarthritis    "back; goes down my right leg" (09/01/2013)   Subclinical hyperthyroidism 02/26/2018   Thyromegaly 12/04/2014   Type II diabetes mellitus (HCC) 2007    Past Surgical History:  Procedure Laterality Date   CARPAL TUNNEL RELEASE Bilateral ~ 2000   TONSILLECTOMY  1970   TUBAL LIGATION  1996    Current Outpatient Medications  Medication Instructions   albuterol (VENTOLIN HFA) 108 (90 Base) MCG/ACT inhaler 2 puffs, Inhalation, Every 6 hours PRN   amLODipine (NORVASC) 10 MG tablet TAKE 1 TABLET BY MOUTH  DAILY   aspirin EC 81 mg, Oral, Daily, Swallow whole.   atorvastatin (LIPITOR) 40 mg, Oral, Daily at bedtime   clonazePAM (KLONOPIN) 1 mg, Oral, 2 times daily, 1 tab AM, 1 tab PM, rx by psychiatry   cyclobenzaprine (FLEXERIL) 10 MG tablet TAKE 1 TABLET BY MOUTH  TWICE DAILY AS NEEDED FOR  MUSCLE SPASM(S)   divalproex (DEPAKOTE) 250 mg, Oral, 2 times daily, 1 tab AM, 2 tabs PM  FIBER ADULT GUMMIES PO Oral   furosemide (LASIX) 40 mg, Oral, 2 times daily   insulin degludec (TRESIBA) 100 Units, Subcutaneous, Daily at bedtime, If blood sugar consistently more than 200, increase to 120 units   ketoconazole (NIZORAL) 2 % cream 1 application., Topical, 2 times daily, For 10 days   meloxicam (MOBIC) 7.5 MG tablet TAKE 1 TABLET BY MOUTH  DAILY AS NEEDED FOR PAIN   metFORMIN (GLUMETZA) 1,000 mg, Oral, 2 times daily with meals   metoprolol tartrate (LOPRESSOR) 50 MG tablet TAKE 1 TABLET BY MOUTH TWICE  DAILY   Vitamin D (Ergocalciferol) (DRISDOL) 50,000 Units, Oral, Every 7 days       Objective:    Physical Exam BP (!) 154/86   Pulse 87   Temp 98 F (36.7 C) (Oral)   Resp 16   Ht 5\' 1"  (1.549 m)   Wt 151 lb 4 oz (68.6 kg)   LMP 08/24/2015 (Exact Date)   SpO2 94%   BMI 28.58 kg/m  General:   Well developed, NAD, BMI noted.  HEENT:  Normocephalic . Face symmetric, atraumatic. TMJ without click Temporal arteries: Palpable, nontender. Carotid pulses: Present Lungs:  CTA B Normal respiratory effort, no intercostal retractions, no accessory muscle use. Heart: RRR,  no murmur.  Abdomen:  Not distended, soft, non-tender. No rebound or rigidity.   Skin: Not pale. Not jaundice Lower extremities: no pretibial edema bilaterally  Neurologic:  alert & oriented X3.  Speech normal, gait appropriate for age and unassisted. Motor symmetric, EOMI Psych--  Cognition and judgment appear intact.  Cooperative with normal attention span and concentration.  Behavior appropriate. No anxious or depressed appearing.     Assessment     Assessment ENDO: started to see Dr 08/26/2015 12/2017 DM  HTN -- dc lisinopril 06-2015>> lips swell x1 , also had stomatitis >> resolved  High cholesterol GI: ---IBS (diarrhea, unable to control BMs) ---Fatty Liver per CT 04-2020 Thyroid disease -Multinodular goiter, thyromegaly w/ dominant nodule: Negative BX 01-2015 - Hyperthyroidism, subclinical, DX 2019 --s/p thyroid ablation 2019 ? IBS.Chronic diarrhea DJD Psychiatry: Sees Dr. 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  Had similar symptoms many years ago.  Review of system is + for increase on chronic stress, she did not like to talk much about it. DDx is large, includes tension headache, cluster headache, others. Plan: CT head today, sed rate,  CBC, BMP. ER if worse.  Trial with tizanidine (hold Flexeril)  Palpitations: As described above, EKG today: Sinus rhythm, rate 93, no acute changes.  Check TSH.  See next  HTN: Typically okay however today is elevated.  It was rechecked: 162/90.  Increase metoprolol 50 mg to 1.5 twice daily temporarily. Pression, bipolar, PTSD: Reports severe increase in his stress lately, strongly recommend to reach out to her counselor, denies suicidal ideas. All instructions printed and reviewed with the patient.  She verbalized understanding RTC 2-week      4-3 Here for CPX DM: Last A1c 11.3 (September 2022, K PN).  Follow-up by Endo Thyroid disease: per Endo, last TSH 2.6 few days ago High cholesterol: LDL 114 few days ago ([facility] Everywhere) , on Lipitor 20 mg, increased to 40 mg daily. IBS, fatty liver per CT. Continue with symptoms, mostly diarrhea, she is also  unable to feel BM coming thus uses diapers.  Has not seen GI (Dr. Randa Evens) in years.  Offered a referral. Will arrange (to [facility], Dr edwards retired) Depression bipolar PTSD: Per psychiatry. CHF with preserved EF: Saw cardiology 10/14/2021.  Felt to be stable Paresthesias, lower extremities: Foot exam negative, sxs not classic for DM neuropathy.  Perhaps neurogenic claudication?  Offered neurology referral- declined .  Check vitamin levels. DJD: On meloxicam daily. Seems to tolerate well  RTC 4 months

## 2021-12-26 ENCOUNTER — Telehealth: Payer: Self-pay | Admitting: Internal Medicine

## 2021-12-26 MED ORDER — TIZANIDINE HCL 2 MG PO TABS: 2.0000 mg | ORAL_TABLET | Freq: Three times a day (TID) | ORAL | 0 refills | Status: DC

## 2021-12-26 NOTE — Telephone Encounter (Signed)
Pharmacy called stating that the Rx that was written on 6.1.23 for tizanidine needed to be rewritten for tablets instead of capsules since insurance will not cover the capsules.

## 2021-12-26 NOTE — Telephone Encounter (Signed)
[facility] [place]  Advanced Heart Failure and Transplant  Heart Transplant Clinic  Follow-up Visit    Primary Care Physician: Brodsky, Mark E  Referring Provider: Brett Justin Berman  Date of Transplant: 09/15/2019  Organ(s) Transplanted: heart  Indication for transplant: Dilated Myopathy: Idiopathic  PHS increased risk donor: Yes    ID. 61 year old female with end-stage HFrEF 2/2 NICM s/p OHT 09/15/19, history of 2R, HTN, HLD and anxiety coming in for f/u of heart transplant.    Interval History:    The patient was last seen on 11/10/21. At that time issues with pain after urologic procedure.    He continues to deal with pain issue largely from prostate surgery. He still has some bleeding and some tissue come out. He tried different strategies and nothing helped. This is really impacting quality of life. He gets tired and frustrated and does not want to take it out on his family.    ROS:  A complete ROS was performed and is negative except as documented in the HPI.      Allergies:  Patient is allergic to cats [other] and dogs [other].    Past Medical History:   Diagnosis Date    Asthma     Atrial fibrillation (CMS-HCC)     Chronic HFrEF (heart failure with reduced ejection fraction) (CMS-HCC)     GERD (gastroesophageal reflux disease)     HTN (hypertension)     Insomnia     Nephrolithiasis     Sinusitis      Patient Active Problem List   Diagnosis    COPD (chronic obstructive pulmonary disease) (CMS-HCC)    Heart transplant, orthotopic, 09/15/2019    Pericardial effusion    Hypertension    Chronic back pain    At risk for infection transmitted from donor    Acute hepatitis C virus infection    Heart transplanted (CMS-HCC)    Acute UTI    Umbilical hernia without obstruction and without gangrene    COVID-19 virus detected    Acute medial meniscus tear of left knee, sequela    Localized osteoarthritis of left knee     Past Surgical History:   Procedure Laterality Date    CARDIAC DEFIBRILLATOR PLACEMENT       PB ANESTH,SHOULDER JOINT,NOS Right      Family History   Problem Relation Name Age of Onset    Hypertension Other      Other Maternal Grandmother          kidney disease needing HD     Social History     Socioeconomic History    Marital status: Single     Spouse name: Not on file    Number of children: Not on file    Years of education: Not on file    Highest education level: Not on file   Occupational History    Not on file   Tobacco Use    Smoking status: Never    Smokeless tobacco: Never    Tobacco comments:     from friends and relatives    Substance and Sexual Activity    Alcohol use: Not Currently     Comment: Prior heavier use, but completely quit in 2016    Drug use: Yes     Comment: eats edible marijuana for pain and insomnia     Sexual activity: Not on file   Other Topics Concern    Not on file   Social History Narrative      Born in [place], also lived in [place], [place], [place], no travel, worked as a carpenter, occasional cedar, no birds, no hot tubs, worked in construction + possible asbestos exposure      Social Determinants of Health     Financial Resource Strain: Not on file   Food Insecurity: Not on file   Transportation Needs: Not on file   Physical Activity: Not on file   Stress: Not on file   Social Connections: Not on file   Intimate Partner Violence: Not on file   Housing Stability: Not on file     Current Outpatient Medications   Medication Sig    albuterol 108 (90 Base) MCG/ACT inhaler Inhale 2 puffs by mouth every 4 hours as needed for Wheezing or Shortness of Breath.    aspirin 81 MG EC tablet Take 1 tablet (81 mg) by mouth daily.    baclofen (LIORESAL) 10 MG tablet Take 2 tablets (20 mg) by mouth nightly.    Blood Glucose Monitoring Suppl (TRUE METRIX METER) w/Device KIT Use as directed    budesonide-formoterol (SYMBICORT) 160-4.5 MCG/ACT inhaler Inhale 2 puffs by mouth every 12 hours.    bumetanide (BUMEX) 1 MG tablet Take 1 tablet (1 mg) by mouth daily as needed (fluid/weight  gain). Do not take unless instructed by Transplant team.    Calcium Carb-Cholecalciferol 600-10 MG-MCG TABS Take 1 tablet by mouth 2 times daily.    Cetirizine HCl (ZERVIATE) 0.24 % SOLN Place 1 drop into both eyes 2 times daily.    clindamycin (CLEOCIN T) 1 % solution Apply 1 Application. topically 2 times daily. Apply to the red bumps on your face up to two times a day.    controlled substance agreement controlled substance agreement    diclofenac (VOLTAREN) 1 % gel Apply 2 g topically 4 times daily.    docusate sodium (COLACE) 100 MG capsule Take 1 capsule (100 mg) by mouth 2 times daily.    DULoxetine (CYMBALTA) 30 MG CR capsule Take 1 capsule (30 mg) by mouth daily.    famotidine (PEPCID) 20 MG tablet Take 1 tablet (20 mg) by mouth 2 times daily.    fluticasone propionate (FLONASE) 50 MCG/ACT nasal spray Spray 1 spray into each nostril 2 times daily.    gabapentin (NEURONTIN) 300 MG capsule Take 1 capsule (300 mg) by mouth every morning AND 1 capsule (300 mg) daily AND 2 capsules (600 mg) every evening.    hydroCHLOROthiazide (HYDRODIURIL) 25 MG tablet Take 1 tablet (25 mg) by mouth daily.    ketoconazole (NIZORAL) 2 % shampoo Use shampoo daily for dandruff    lidocaine (LIDOCAINE PAIN RELIEF) 4 % patch Apply 1 patch topically every 24 hours. Leave patch on for 12 hours, then remove for 12 hours.    lisinopril (PRINIVIL, ZESTRIL) 10 MG tablet Take 2 tablets (20 mg) by mouth daily.    magnesium oxide (MAG-OX) 400 MG tablet Take 1 tablet by mouth daily    melatonin (GNP MELATONIN MAXIMUM STRENGTH) 5 MG tablet Take 2 tablets (10 mg) by mouth at bedtime.    Multiple Vitamin (MULTIVITAMIN) TABS tablet Take 1 tablet by mouth daily.    naloxone (KLOXXADO) 8 mg/0.1 mL nasal spray Call 911! Tilt head and spray intranasally into one nostril as needed for respiratory depression. If patient does not respond or responds and then relapses, repeat using a new nasal spray every 3 minutes until emergency medical assistance  arrives.    NEEDLE, DISP, 25 G 25G X   1" MISC Use to inject testosterone    NIFEdipine (ADALAT CC) 30 MG Controlled-Release tablet Take 1 tablet (30 mg) by mouth nightly.    ondansetron (ZOFRAN) 8 MG tablet Take 1 tablet (8 mg) by mouth every 8 hours as needed for Nausea/Vomiting.    oxyCODONE (ROXICODONE) 10 MG tablet Take 1 tab every 4 hours as needed for moderate pain and 2 tabs every 4 hours as needed for severe pain. Max 10 tabs per day, 28 day supply    phenazopyridine (PYRIDIUM) 100 MG tablet Take 1 tablet (100 mg) by mouth 3 times daily.    polyethylene glycol (GLYCOLAX) 17 GM/SCOOP powder Mix 17 grams in 4-8 oz of liquide and drink by mouth daily as needed (Constipation).    pravastatin (PRAVACHOL) 40 MG tablet Take 1 tablet (40 mg) by mouth every evening.    senna (SENOKOT) 8.6 MG tablet Take 1 tablet (8.6 mg) by mouth daily.    sirolimus (RAPAMUNE) 1 MG tablet Take 2 tablets (2 mg) by mouth every morning.    SYRINGE-NEEDLE, DISP, 3 ML (B-D 3CC LUER-LOK SYR 25GX1") 25G X 1" 3 ML MISC Use as directed to inject testosterone    SYRINGE-NEEDLE, DISP, 3 ML 18G X 1-1/2" 3 ML MISC Use to draw up testosterone    tacrolimus (ENVARSUS XR) 1 MG tablet STOP TAKING since 04/08/22 - remaining on chart for dose adjustments, titratable med.    tacrolimus (ENVARSUS XR) 4 MG tablet Take 1 tablet (4 mg) by mouth every morning.    tamsulosin (FLOMAX) 0.4 MG capsule Take 1 capsule (0.4 mg) by mouth daily.    tamsulosin (FLOMAX) 0.4 MG capsule Take 1 capsule (0.4 mg) by mouth daily.    testosterone cypionate (DEPO-TESTOSTERONE) 200 MG/ML SOLN Inject 1 ml into the muscle every 14 days    traZODone (DESYREL) 50 MG tablet Take 1 tablet (50 mg) by mouth nightly.     Current Facility-Administered Medications   Medication    diphenhydrAMINE (BENADRYL) injection 50 mg    diphenhydrAMINE (BENADRYL) tablet 50 mg     Immunization History   Administered Date(s) Administered    COVID-19 (Moderna) Low Dose Red Cap >= 18 Years 09/06/2020     COVID-19 (Moderna) Red Cap >= 12 Years 10/14/2019, 11/13/2019, 03/13/2020    Hep-A/Hep-B; Twinrix, Adult 10/07/2020    Influenza Vaccine (High Dose) Quadrivalent >=65 Years 05/29/2020    Influenza Vaccine (Unspecified) 03/27/2017    Influenza Vaccine >=6 Months 06/09/2010, 06/09/2011, 08/04/2012, 05/02/2014, 04/15/2018, 05/01/2019    Pneumococcal 13 Vaccine (PREVNAR-13) 05/29/2020    Pneumococcal 23 Vaccine (PNEUMOVAX-23) 06/09/2013, 10/07/2020    Tdap 07/28/2011   Deferred Date(s) Deferred    Pneumococcal 23 Vaccine (PNEUMOVAX-23) 09/28/2019     Physical Exam:  BP 102/69 (BP Location: Right arm, BP Patient Position: Sitting, BP cuff size: Large)   Pulse 98   Temp 98.5 F (36.9 C) (Temporal)   Resp 16   Ht 5' 10" (1.778 m)   Wt 96.2 kg (212 lb)   SpO2 97%   BMI 30.42 kg/m      General Appearance: ***alert, no distress, pleasant affect, cooperative.  Heart:  JVD ***, PMI ***, normal rate and regular rhythm, no murmurs, clicks, or gallops. ***  Lungs: ***clear to auscultation and percussion. No rales, rhonchi, or wheezes noted. No chest deformities noted.  Abdomen: ***BS normal.  Abdomen soft, non-tender.  No masses or organomegaly.  Extremities:  ***no cyanosis, clubbing, or edema. Has 2+ peripheral pulses.        Lab Data:  Lab Results   Component Value Date    BUN 26 (H) 04/08/2022    CREAT 1.98 (H) 04/08/2022    CL 99 04/08/2022    NA 140 04/08/2022    K 4.4 04/08/2022    CA 9.2 04/08/2022    TBILI 0.47 04/08/2022    ALB 4.1 04/08/2022    TP 7.1 04/08/2022    AST 22 04/08/2022    ALK 76 04/08/2022    BICARB 29 04/08/2022    ALT 25 04/08/2022    GLU 126 (H) 04/08/2022     Lab Results   Component Value Date    WBC 7.9 04/08/2022    RBC 5.50 04/08/2022    HGB 15.2 04/08/2022    HCT 46.5 04/08/2022    MCV 84.5 04/08/2022    MCHC 32.7 04/08/2022    RDW 12.3 04/08/2022    PLT 162 04/08/2022    MPV 11.6 04/08/2022     Lab Results   Component Value Date    A1C 5.7 09/05/2021     Lab Results   Component Value Date     TSH 1.63 09/05/2021     Lab Results   Component Value Date    CHOL 105 09/05/2021    HDL 38 09/05/2021    LDLCALC 43 09/05/2021    TRIG 121 09/05/2021     Lab Results   Component Value Date    SIROT 11.5 04/08/2022     Lab Results   Component Value Date    FKTR 6.5 04/08/2022     No results found for: CSATR  Lab Results   Component Value Date    CMVPL Not Detected 06/27/2021     Lab Results   Component Value Date    DSA ABSENT 04/08/2022       Prior Cardiovascular Studies:   Lab Results   Component Value Date    LV Ejection Fraction 59 10/02/2021          Echo 10/02/21  Summary:   1. The left ventricular size is normal. The left ventricular systolic function is normal.   2. No left ventricular hypertrophy.   3. Normal pattern of left ventricular diastolic filling.   4. EF=59%.   5. Compared to prior study EF now 59%, was 69% 10/25/20.     LHC/IVUS 09/30/21  CONCLUSION:                                                                   1. Myocardial bridging with mild systolic compression of the mid segment    of the left anterior descending coronary artery.                              2. No angiographic evidence of coronary artery disease.                      3. Intimal thickness noted in LAD/LM up to 0.5 mm (Stable to slightly       worse compare to 2022).                                                         4. Non significant FFR at apical LAD.                                        5. Left ventricular end diastolic pressure appears normal.        Assessment summary:  61 year old female with end-stage HFrEF 2/2 NICM s/p OHT 09/15/19, history of 2R, HTN, HLD and anxiety coming in for f/u of heart transplant.    Assessment/Plan:  # Hematuria  # Dysuria  # Chronic pain  Assessment: We had a long frank discussion about patient's chronic pain issues and the heart transplant team's role in this. I discussed with him that when I initially agreed to cover his chronic opiate prescription, this was the assumption that he would  have a provider versed in chronic pain after 3-4 months, but we are at 6 months and has unable to find one. Additionally, I had not put him on a pain contract at that time, but he recently used more opiates without asking and I informed him this was not appropriate, but because he had not established guidelines I was not going to stop at this time. However, going forward until he can establish with a pain physician, we will set up a pain contract and he will need to follow through like a usual pain clinic with us with goal of provider in 3-4 months or I may start tapering. I will augment adjuvant agents additionally for now and we can continue to work on this.  Plan:  -pain contract signed  -urine tox monthly  -clinic follow up month  -oxycodone 10 mg tablets PO, 1 tab every 4 hours moderate pain, 2 tabs every 4 hours for severe pain, no more than 10 tablets a day, total 280 per 28 days.   -diclofenac cream for joint pain  -lidocaine patch for back pain  -trial of pyridium  -increase gaba at night  -siro change as below  -cymbalta as below    # End-stage heart failure s/p orthotopic heart transplant  # Chronic Immunosuppression/Immunomodulation  Assessment: While we thought continuing sirolimus would help prevent recurrent scar tissue from prostate procedure, it may be exacerbating factors now with delayed wound healing. Will try mmf for 1 month.  Plan:   - continue envarsus 6 mg daily, goal trough 4-8  - HOLD sirolimus 3 mg daily, goal trough 4-8 for at least 1 month  - start mmf 1000 mg bid for one month to allow healing  - Continue to monitor for renal toxicities, infection risk and malignancy risk  - continue pravastatin 40 mg daily  - continue aspirin 81 mg daily    # Hypertension  Assessment: controlled  Plan:  -continue lisinopril 20 mg daily  -resume hctz  -nifedipine 30 mg daily    # Dyslipidemia  -continue pravastatin 40 mg daily    # Depression  Assessment: improved mood  Plan:  -increase cymbalta to 120  mg daily     RTC in 1 month       Nicholas W Wettersten, MD  Advanced Heart Failure, Mechanical Circulatory Support, Transplant  Pgr: [phone]

## 2021-12-26 NOTE — Assessment & Plan Note (Addendum)
Headache: Steady headache for the last 12 days, at the temples, neuro exam nonfocal.  Had similar symptoms many years ago.  Review of system is + for increase on chronic stress, she did not like to talk much about it. DDx is large, includes tension headache, cluster headache, others. Plan: CT head today, sed rate, CBC, BMP. ER if worse.  Trial with tizanidine (hold Flexeril) Palpitations: As described above, EKG today: Sinus rhythm, rate 93, no acute changes.  Check TSH.  See next HTN: Typically okay however today is elevated.  It was rechecked: 162/90.  Increase metoprolol 50 mg to 1.5 twice daily temporarily. Depression, bipolar, PTSD: Reports severe increase in his stress lately, strongly recommend to reach out to her counselor, denies suicidal ideas. All instructions printed and reviewed with the patient.  She verbalized understanding RTC 2-week

## 2021-12-30 ENCOUNTER — Other Ambulatory Visit: Payer: Self-pay | Admitting: Internal Medicine

## 2022-01-02 ENCOUNTER — Other Ambulatory Visit: Payer: Self-pay | Admitting: Internal Medicine

## 2022-01-02 ENCOUNTER — Encounter: Payer: Self-pay | Admitting: Internal Medicine

## 2022-01-05 ENCOUNTER — Encounter: Payer: Self-pay | Admitting: Cardiology

## 2022-01-08 ENCOUNTER — Encounter (HOSPITAL_BASED_OUTPATIENT_CLINIC_OR_DEPARTMENT_OTHER): Payer: Self-pay | Admitting: Emergency Medicine

## 2022-01-08 ENCOUNTER — Other Ambulatory Visit: Payer: Self-pay

## 2022-01-08 ENCOUNTER — Emergency Department (HOSPITAL_BASED_OUTPATIENT_CLINIC_OR_DEPARTMENT_OTHER): Payer: Medicare Other

## 2022-01-08 ENCOUNTER — Inpatient Hospital Stay (HOSPITAL_BASED_OUTPATIENT_CLINIC_OR_DEPARTMENT_OTHER)
Admission: EM | Admit: 2022-01-08 | Discharge: 2022-01-10 | DRG: 190 | Disposition: A | Discharge status: Home / Self Care | Payer: Medicare Other | Attending: Internal Medicine | Admitting: Internal Medicine

## 2022-01-08 DIAGNOSIS — Z7982 Long term (current) use of aspirin: Secondary | ICD-10-CM | POA: Diagnosis not present

## 2022-01-08 DIAGNOSIS — Z794 Long term (current) use of insulin: Secondary | ICD-10-CM

## 2022-01-08 DIAGNOSIS — Z8249 Family history of ischemic heart disease and other diseases of the circulatory system: Secondary | ICD-10-CM

## 2022-01-08 DIAGNOSIS — Z880 Allergy status to penicillin: Secondary | ICD-10-CM

## 2022-01-08 DIAGNOSIS — M1990 Unspecified osteoarthritis, unspecified site: Secondary | ICD-10-CM | POA: Diagnosis present

## 2022-01-08 DIAGNOSIS — G4700 Insomnia, unspecified: Secondary | ICD-10-CM | POA: Diagnosis present

## 2022-01-08 DIAGNOSIS — K219 Gastro-esophageal reflux disease without esophagitis: Secondary | ICD-10-CM | POA: Diagnosis present

## 2022-01-08 DIAGNOSIS — N179 Acute kidney failure, unspecified: Secondary | ICD-10-CM | POA: Diagnosis not present

## 2022-01-08 DIAGNOSIS — R0600 Dyspnea, unspecified: Secondary | ICD-10-CM | POA: Diagnosis not present

## 2022-01-08 DIAGNOSIS — Z888 Allergy status to other drugs, medicaments and biological substances status: Secondary | ICD-10-CM | POA: Diagnosis not present

## 2022-01-08 DIAGNOSIS — K580 Irritable bowel syndrome with diarrhea: Secondary | ICD-10-CM | POA: Diagnosis present

## 2022-01-08 DIAGNOSIS — E7800 Pure hypercholesterolemia, unspecified: Secondary | ICD-10-CM | POA: Diagnosis not present

## 2022-01-08 DIAGNOSIS — I5020 Unspecified systolic (congestive) heart failure: Secondary | ICD-10-CM | POA: Diagnosis not present

## 2022-01-08 DIAGNOSIS — Z833 Family history of diabetes mellitus: Secondary | ICD-10-CM | POA: Diagnosis not present

## 2022-01-08 DIAGNOSIS — Z91011 Allergy to milk products: Secondary | ICD-10-CM | POA: Diagnosis not present

## 2022-01-08 DIAGNOSIS — J9601 Acute respiratory failure with hypoxia: Secondary | ICD-10-CM

## 2022-01-08 DIAGNOSIS — F4310 Post-traumatic stress disorder, unspecified: Secondary | ICD-10-CM | POA: Diagnosis present

## 2022-01-08 DIAGNOSIS — E1165 Type 2 diabetes mellitus with hyperglycemia: Secondary | ICD-10-CM | POA: Diagnosis present

## 2022-01-08 DIAGNOSIS — F319 Bipolar disorder, unspecified: Secondary | ICD-10-CM | POA: Diagnosis present

## 2022-01-08 DIAGNOSIS — E11319 Type 2 diabetes mellitus with unspecified diabetic retinopathy without macular edema: Secondary | ICD-10-CM | POA: Diagnosis not present

## 2022-01-08 DIAGNOSIS — I509 Heart failure, unspecified: Secondary | ICD-10-CM | POA: Diagnosis not present

## 2022-01-08 DIAGNOSIS — Z791 Long term (current) use of non-steroidal anti-inflammatories (NSAID): Secondary | ICD-10-CM

## 2022-01-08 DIAGNOSIS — E785 Hyperlipidemia, unspecified: Secondary | ICD-10-CM | POA: Diagnosis present

## 2022-01-08 DIAGNOSIS — Z79899 Other long term (current) drug therapy: Secondary | ICD-10-CM | POA: Diagnosis not present

## 2022-01-08 DIAGNOSIS — I5033 Acute on chronic diastolic (congestive) heart failure: Secondary | ICD-10-CM | POA: Diagnosis not present

## 2022-01-08 DIAGNOSIS — J441 Chronic obstructive pulmonary disease with (acute) exacerbation: Principal | ICD-10-CM | POA: Diagnosis present

## 2022-01-08 DIAGNOSIS — J449 Chronic obstructive pulmonary disease, unspecified: Secondary | ICD-10-CM | POA: Diagnosis present

## 2022-01-08 DIAGNOSIS — I10 Essential (primary) hypertension: Secondary | ICD-10-CM | POA: Diagnosis present

## 2022-01-08 DIAGNOSIS — Z7984 Long term (current) use of oral hypoglycemic drugs: Secondary | ICD-10-CM | POA: Diagnosis not present

## 2022-01-08 DIAGNOSIS — J9621 Acute and chronic respiratory failure with hypoxia: Secondary | ICD-10-CM | POA: Diagnosis present

## 2022-01-08 DIAGNOSIS — I110 Hypertensive heart disease with heart failure: Secondary | ICD-10-CM | POA: Diagnosis present

## 2022-01-08 HISTORY — DX: Heart failure, unspecified: I50.9

## 2022-01-08 LAB — BASIC METABOLIC PANEL
Anion gap: 11 (ref 5–15)
BUN: 26 mg/dL — ABNORMAL HIGH (ref 8–23)
CO2: 30 mmol/L (ref 22–32)
Calcium: 8.2 mg/dL — ABNORMAL LOW (ref 8.9–10.3)
Chloride: 95 mmol/L — ABNORMAL LOW (ref 98–111)
Creatinine, Ser: 1.21 mg/dL — ABNORMAL HIGH (ref 0.44–1.00)
GFR, Estimated: 51 mL/min — ABNORMAL LOW (ref >60)
Glucose, Bld: 221 mg/dL — ABNORMAL HIGH (ref 70–99)
Potassium: 3.5 mmol/L (ref 3.5–5.1)
Sodium: 136 mmol/L (ref 135–145)

## 2022-01-08 LAB — TROPONIN I (HIGH SENSITIVITY): Troponin I (High Sensitivity): 8 ng/L (ref <18)

## 2022-01-08 LAB — CBC WITH DIFFERENTIAL/PLATELET
Abs Immature Granulocytes: 0.02 10*3/uL (ref 0.00–0.07)
Basophils Absolute: 0.1 10*3/uL (ref 0.0–0.1)
Basophils Relative: 1 %
Eosinophils Absolute: 0.2 10*3/uL (ref 0.0–0.5)
Eosinophils Relative: 2 %
HCT: 37 % (ref 36.0–46.0)
Hemoglobin: 12.1 g/dL (ref 12.0–15.0)
Immature Granulocytes: 0 %
Lymphocytes Relative: 30 %
Lymphs Abs: 2.7 10*3/uL (ref 0.7–4.0)
MCH: 30.3 pg (ref 26.0–34.0)
MCHC: 32.7 g/dL (ref 30.0–36.0)
MCV: 92.5 fL (ref 80.0–100.0)
Monocytes Absolute: 0.7 10*3/uL (ref 0.1–1.0)
Monocytes Relative: 7 %
Neutro Abs: 5.6 10*3/uL (ref 1.7–7.7)
Neutrophils Relative %: 60 %
Platelets: 240 10*3/uL (ref 150–400)
RBC: 4 MIL/uL (ref 3.87–5.11)
RDW: 13.9 % (ref 11.5–15.5)
WBC: 9.2 10*3/uL (ref 4.0–10.5)
nRBC: 0 % (ref 0.0–0.2)

## 2022-01-08 LAB — HIV ANTIBODY (ROUTINE TESTING W REFLEX): HIV Screen 4th Generation wRfx: NONREACTIVE

## 2022-01-08 LAB — BRAIN NATRIURETIC PEPTIDE: B Natriuretic Peptide: 239.6 pg/mL — ABNORMAL HIGH (ref 0.0–100.0)

## 2022-01-08 LAB — GLUCOSE, CAPILLARY: Glucose-Capillary: 303 mg/dL — ABNORMAL HIGH (ref 70–99)

## 2022-01-08 MED ORDER — ATORVASTATIN CALCIUM 40 MG PO TABS
40.0000 mg | ORAL_TABLET | Freq: Every day | ORAL | Status: DC
  Administered 2022-01-09 – 2022-01-10 (×2): 40 mg via ORAL
  Filled 2022-01-08 (×2): qty 1

## 2022-01-08 MED ORDER — FUROSEMIDE 40 MG PO TABS
40.0000 mg | ORAL_TABLET | Freq: Two times a day (BID) | ORAL | Status: DC
  Administered 2022-01-09: 40 mg via ORAL
  Filled 2022-01-08: qty 1

## 2022-01-08 MED ORDER — VITAMIN D (ERGOCALCIFEROL) 1.25 MG (50000 UNIT) PO CAPS: 50000.0000 [IU] | ORAL_CAPSULE | ORAL | Status: DC

## 2022-01-08 MED ORDER — ALBUTEROL SULFATE (2.5 MG/3ML) 0.083% IN NEBU: 2.5000 mg | INHALATION_SOLUTION | RESPIRATORY_TRACT | Status: DC | PRN

## 2022-01-08 MED ORDER — METOPROLOL TARTRATE 50 MG PO TABS
75.0000 mg | ORAL_TABLET | Freq: Two times a day (BID) | ORAL | Status: DC
  Administered 2022-01-08 – 2022-01-10 (×4): 75 mg via ORAL
  Filled 2022-01-08 (×4): qty 1

## 2022-01-08 MED ORDER — DIPHENHYDRAMINE HCL 50 MG/ML IJ SOLN
25.0000 mg | Freq: Once | INTRAMUSCULAR | Status: AC
Start: 2022-01-09 — End: 2022-01-08
  Administered 2022-01-08: 25 mg via INTRAVENOUS
  Filled 2022-01-08: qty 1

## 2022-01-08 MED ORDER — INSULIN DEGLUDEC 200 UNIT/ML ~~LOC~~ SOPN
PEN_INJECTOR | Freq: Every day | SUBCUTANEOUS | Status: DC
Start: 2022-01-08 — End: 2022-01-08

## 2022-01-08 MED ORDER — IPRATROPIUM-ALBUTEROL 0.5-2.5 (3) MG/3ML IN SOLN
3.0000 mL | RESPIRATORY_TRACT | Status: AC
  Administered 2022-01-08 (×2): 3 mL via RESPIRATORY_TRACT
  Filled 2022-01-08 (×3): qty 3

## 2022-01-08 MED ORDER — DOXYCYCLINE HYCLATE 100 MG PO TABS
100.0000 mg | ORAL_TABLET | Freq: Two times a day (BID) | ORAL | Status: DC
  Administered 2022-01-08 – 2022-01-10 (×4): 100 mg via ORAL
  Filled 2022-01-08 (×4): qty 1

## 2022-01-08 MED ORDER — INSULIN ASPART 100 UNIT/ML IJ SOLN: 0.0000 [IU] | Freq: Three times a day (TID) | INTRAMUSCULAR | Status: DC

## 2022-01-08 MED ORDER — INSULIN ASPART 100 UNIT/ML IJ SOLN
0.0000 [IU] | Freq: Every day | INTRAMUSCULAR | Status: DC
  Administered 2022-01-08: 4 [IU] via SUBCUTANEOUS
  Administered 2022-01-09: 5 [IU] via SUBCUTANEOUS

## 2022-01-08 MED ORDER — ALBUTEROL SULFATE HFA 108 (90 BASE) MCG/ACT IN AERS
2.0000 | INHALATION_SPRAY | Freq: Four times a day (QID) | RESPIRATORY_TRACT | Status: DC | PRN
Start: 2022-01-08 — End: 2022-01-08

## 2022-01-08 MED ORDER — INSULIN GLARGINE-YFGN 100 UNIT/ML ~~LOC~~ SOLN
50.0000 [IU] | Freq: Every day | SUBCUTANEOUS | Status: DC
  Administered 2022-01-08 – 2022-01-09 (×2): 50 [IU] via SUBCUTANEOUS
  Filled 2022-01-08 (×3): qty 0.5

## 2022-01-08 MED ORDER — METHYLPREDNISOLONE SODIUM SUCC 125 MG IJ SOLR
125.0000 mg | Freq: Two times a day (BID) | INTRAMUSCULAR | Status: AC
  Administered 2022-01-08 – 2022-01-09 (×2): 125 mg via INTRAVENOUS
  Filled 2022-01-08 (×2): qty 2

## 2022-01-08 MED ORDER — DIVALPROEX SODIUM 250 MG PO DR TAB
500.0000 mg | DELAYED_RELEASE_TABLET | Freq: Every day | ORAL | Status: DC
  Administered 2022-01-08 – 2022-01-09 (×2): 500 mg via ORAL
  Filled 2022-01-08 (×2): qty 2

## 2022-01-08 MED ORDER — IPRATROPIUM-ALBUTEROL 0.5-2.5 (3) MG/3ML IN SOLN
3.0000 mL | Freq: Four times a day (QID) | RESPIRATORY_TRACT | Status: DC
Start: 2022-01-08 — End: 2022-01-09
  Administered 2022-01-08: 3 mL via RESPIRATORY_TRACT
  Filled 2022-01-08: qty 3

## 2022-01-08 MED ORDER — MAGNESIUM SULFATE 2 GM/50ML IV SOLN
2.0000 g | Freq: Once | INTRAVENOUS | Status: AC
  Administered 2022-01-08: 2 g via INTRAVENOUS
  Filled 2022-01-08: qty 50

## 2022-01-08 MED ORDER — AMLODIPINE BESYLATE 10 MG PO TABS
10.0000 mg | ORAL_TABLET | Freq: Every day | ORAL | Status: DC
  Administered 2022-01-08 – 2022-01-10 (×3): 10 mg via ORAL
  Filled 2022-01-08 (×3): qty 1

## 2022-01-08 MED ORDER — PREDNISONE 20 MG PO TABS
40.0000 mg | ORAL_TABLET | Freq: Every day | ORAL | Status: DC
  Administered 2022-01-10: 40 mg via ORAL
  Filled 2022-01-08: qty 2

## 2022-01-08 MED ORDER — MELOXICAM 7.5 MG PO TABS
7.5000 mg | ORAL_TABLET | Freq: Every day | ORAL | Status: DC
  Administered 2022-01-09 – 2022-01-10 (×2): 7.5 mg via ORAL
  Filled 2022-01-08 (×2): qty 1

## 2022-01-08 MED ORDER — TIZANIDINE HCL 4 MG PO TABS
2.0000 mg | ORAL_TABLET | Freq: Three times a day (TID) | ORAL | Status: DC
  Administered 2022-01-08 – 2022-01-10 (×6): 2 mg via ORAL
  Filled 2022-01-08 (×6): qty 1

## 2022-01-08 MED ORDER — ASPIRIN 81 MG PO TBEC
81.0000 mg | DELAYED_RELEASE_TABLET | Freq: Every day | ORAL | Status: DC
  Administered 2022-01-09 – 2022-01-10 (×2): 81 mg via ORAL
  Filled 2022-01-08 (×2): qty 1

## 2022-01-08 MED ORDER — ENOXAPARIN SODIUM 40 MG/0.4ML IJ SOSY
40.0000 mg | PREFILLED_SYRINGE | INTRAMUSCULAR | Status: DC
  Administered 2022-01-08 – 2022-01-09 (×2): 40 mg via SUBCUTANEOUS
  Filled 2022-01-08 (×2): qty 0.4

## 2022-01-08 MED ORDER — DIVALPROEX SODIUM 250 MG PO DR TAB
250.0000 mg | DELAYED_RELEASE_TABLET | Freq: Every day | ORAL | Status: DC
  Administered 2022-01-09 – 2022-01-10 (×2): 250 mg via ORAL
  Filled 2022-01-08 (×2): qty 1

## 2022-01-08 MED ORDER — METHYLPREDNISOLONE SODIUM SUCC 125 MG IJ SOLR
125.0000 mg | Freq: Once | INTRAMUSCULAR | Status: AC
Start: 2022-01-08 — End: 2022-01-08
  Administered 2022-01-08: 125 mg via INTRAVENOUS
  Filled 2022-01-08: qty 2

## 2022-01-08 MED ORDER — CYCLOBENZAPRINE HCL 10 MG PO TABS
10.0000 mg | ORAL_TABLET | Freq: Two times a day (BID) | ORAL | Status: DC
  Administered 2022-01-08 – 2022-01-10 (×4): 10 mg via ORAL
  Filled 2022-01-08 (×4): qty 1

## 2022-01-08 MED ORDER — CLONAZEPAM 1 MG PO TABS
1.0000 mg | ORAL_TABLET | Freq: Two times a day (BID) | ORAL | Status: DC
  Administered 2022-01-08 – 2022-01-10 (×4): 1 mg via ORAL
  Filled 2022-01-08 (×4): qty 1

## 2022-01-08 MED ORDER — MELATONIN 5 MG PO TABS
10.0000 mg | ORAL_TABLET | Freq: Every day | ORAL | Status: DC
  Administered 2022-01-08 – 2022-01-09 (×2): 10 mg via ORAL
  Filled 2022-01-08 (×2): qty 2

## 2022-01-08 MED ORDER — KETOCONAZOLE 2 % EX CREA: 1.0000 | TOPICAL_CREAM | Freq: Every day | CUTANEOUS | Status: DC | PRN

## 2022-01-08 NOTE — ED Notes (Signed)
Pt up to  BR ambulatory 

## 2022-01-08 NOTE — ED Notes (Signed)
Pt given chicken noodle soup and crackers and drink

## 2022-01-08 NOTE — ED Provider Notes (Signed)
[facility] EMERGENCY DEPARTMENT Provider Note   CSN: [id number] Arrival date & time: 01/08/22  0106     History  Chief Complaint  Patient presents with   Shortness of Breath    Adrienne Bennett is a 61 y.o. female.  61 yo F with a chief complaint of difficulty breathing.  This started abruptly this evening.  She felt like she needed to use her inhaler but did not make much of a difference.  Then decided to come into the Emergency Department for evaluation.  For like yesterday she was fine.  She denies cough congestion or fever.  Felt some chest tightness with this which is typical of her reactive airway disease.  Denies nausea vomiting or diarrhea.  Denies increased swelling to her legs.   Shortness of Breath      Home Medications Prior to Admission medications   Medication Sig Start Date End Date Taking? Authorizing Provider  albuterol (VENTOLIN HFA) 108 (90 Base) MCG/ACT inhaler Inhale 2 puffs into the lungs every 6 (six) hours as needed for wheezing or shortness of breath. 02/26/21   [name], MD  amLODipine (NORVASC) 10 MG tablet TAKE 1 TABLET BY MOUTH  DAILY Patient taking differently: Take 10 mg by mouth daily. 09/24/21   [name], MD  aspirin EC 81 MG tablet Take 81 mg by mouth daily. Swallow whole.    [provider]  atorvastatin (LIPITOR) 40 MG tablet Take 1 tablet (40 mg total) by mouth at bedtime. 10/27/21   [name], MD  clonazePAM (KLONOPIN) 1 MG tablet Take 1 mg by mouth 2 (two) times daily. 1 tab AM, 1 tab PM, rx by psychiatry    [provider]  cyclobenzaprine (FLEXERIL) 10 MG tablet TAKE 1 TABLET BY MOUTH  TWICE DAILY AS NEEDED FOR  MUSCLE SPASM(S) Patient taking differently: Take 10 mg by mouth 3 (three) times daily as needed for muscle spasms. 05/19/21   [name], MD  divalproex (DEPAKOTE) 250 MG DR tablet Take 250 mg by mouth 2 (two) times daily. 1 tab AM, 2 tabs PM    [provider]  FIBER ADULT GUMMIES PO Take  by mouth.    [provider]  furosemide (LASIX) 40 MG tablet Take 1 tablet (40 mg total) by mouth 2 (two) times daily. 05/02/21   Georgeanna Lea, MD  insulin degludec (TRESIBA) 200 UNIT/ML FlexTouch Pen Inject 100 Units into the skin at bedtime. If blood sugar consistently more than 200, increase to 120 units 05/02/20   Paz, [name], MD  ketoconazole (NIZORAL) 2 % cream Apply 1 application topically 2 (two) times daily. For 10 days 03/27/21   [name], MD  meloxicam (MOBIC) 7.5 MG tablet TAKE 1 TABLET BY MOUTH  DAILY AS NEEDED FOR PAIN Patient taking differently: Take 7.5 mg by mouth daily. 09/18/21   [name], MD  metFORMIN (GLUMETZA) 1000 MG (MOD) 24 hr tablet Take 1 tablet (1,000 mg total) by mouth 2 (two) times daily with a meal.    [name], MD  metoprolol tartrate (LOPRESSOR) 50 MG tablet TAKE 1 TABLET BY MOUTH TWICE  DAILY 11/18/21   [name], MD  tiZANidine (ZANAFLEX) 2 MG tablet Take 1 tablet (2 mg total) by mouth 3 (three) times daily. 12/26/21   [name], MD  Vitamin D, Ergocalciferol, (DRISDOL) 1.25 MG (50000 UNIT) CAPS capsule Take 1 capsule (50,000 Units total) by mouth every 7 (seven)  days. 10/30/21   [name], MD      Allergies    Benztropine, Dairy aid [tilactase], Lisinopril, Penicillins, and Monistat 3 combo pack app  [miconazole nitrate]    Review of Systems   Review of Systems  Respiratory:  Positive for shortness of breath.     Physical Exam Updated Vital Signs BP (!) 170/74 (BP Location: Right Arm)   Pulse 77   Temp 98.3 F (36.8 C) (Oral)   Resp 18   Ht 5\' 1"  (1.549 m)   Wt 64.9 kg   LMP 08/24/2015 (Exact Date)   SpO2 94%   BMI 27.02 kg/m  Physical Exam Vitals and nursing note reviewed.  Constitutional:      General: She is not in acute distress.    Appearance: She is well-developed. She is not diaphoretic.  HENT:     Head: Normocephalic and atraumatic.  Eyes:     Pupils: Pupils are equal, round, and reactive to light.   Cardiovascular:     Rate and Rhythm: Normal rate and regular rhythm.     Heart sounds: No murmur heard.    No friction rub. No gallop.  Pulmonary:     Effort: Pulmonary effort is normal. Tachypnea present.     Breath sounds: No wheezing or rales.     Comments: Significantly diminished breath sounds in all fields with prolonged expiratory effort. Abdominal:     General: There is no distension.     Palpations: Abdomen is soft.     Tenderness: There is no abdominal tenderness.  Musculoskeletal:        General: No tenderness.     Cervical back: Normal range of motion and neck supple.  Skin:    General: Skin is warm and dry.  Neurological:     Mental Status: She is alert and oriented to person, place, and time.  Psychiatric:        Behavior: Behavior normal.     ED Results / Procedures / Treatments   Labs (all labs ordered are listed, but only abnormal results are displayed) Labs Reviewed  BASIC METABOLIC PANEL - Abnormal; Notable for the following components:      Result Value   Chloride 95 (*)    Glucose, Bld 221 (*)    BUN 26 (*)    Creatinine, Ser 1.21 (*)    Calcium 8.2 (*)    GFR, Estimated 51 (*)    All other components within normal limits  BRAIN NATRIURETIC PEPTIDE - Abnormal; Notable for the following components:   B Natriuretic Peptide 239.6 (*)    All other components within normal limits  CBC WITH DIFFERENTIAL/PLATELET  TROPONIN I (HIGH SENSITIVITY)    EKG EKG Interpretation  Date/Time:  Thursday January 08 2022 01:17:17 EDT Ventricular Rate:  76 PR Interval:  159 QRS Duration: 84 QT Interval:  397 QTC Calculation: 447 R Axis:   63 Text Interpretation: Sinus rhythm Probable left atrial enlargement Borderline repolarization abnormality Since last tracing rate faster Otherwise no significant change Confirmed by [id number] [phone]) on 01/08/2022 2:04:43 AM  Radiology DG Chest Port 1 View  Result Date: 01/08/2022 CLINICAL DATA:  Dyspnea, congestive heart  failure EXAM: PORTABLE CHEST 1 VIEW COMPARISON:  11/27/2020 FINDINGS: The lungs are symmetrically well expanded. Mild bilateral perihilar interstitial pulmonary infiltrate is present in keeping with changes of mild cardiogenic failure. No pneumothorax. Small left pleural effusion is not excluded. Mild cardiomegaly is stable. No acute bone abnormality IMPRESSION: Mild cardiogenic failure. Electronically Signed  By: Helyn Numbers M.D.   On: 01/08/2022 01:42    Procedures Procedures    Medications Ordered in ED Medications  ipratropium-albuterol (DUONEB) 0.5-2.5 (3) MG/3ML nebulizer solution 3 mL (3 mLs Nebulization Given 01/08/22 0156)  magnesium sulfate IVPB 2 g 50 mL (2 g Intravenous New Bag/Given 01/08/22 0205)  methylPREDNISolone sodium succinate (SOLU-MEDROL) 125 mg/2 mL injection 125 mg (125 mg Intravenous Given 01/08/22 0203)    ED Course/ Medical Decision Making/ A&P                           Medical Decision Making Amount and/or Complexity of Data Reviewed Labs: ordered. Radiology: ordered.  Risk Prescription drug management.   61 yo F with a chief complaints of difficulty breathing.  This been going on for just a few hours.  Occurred when she laid down to go to sleep.  By history it sounds like it could be acute pulmonary edema but the patient is presenting more typically with a COPD exacerbation.  Hypoxic on room air initially in the mid to low 80s.  Placed on 2 L with significant improvement.  Given 3 DuoNebs back-to-back steroids and magnesium with some improvement but persistent difficulty breathing.  Chest x-ray independently interpreted by me without focal infiltrate.  Will discuss with medicine for admission.  CRITICAL CARE Performed by: Rae Roam   Total critical care time: 35 minutes  Critical care time was exclusive of separately billable procedures and treating other patients.  Critical care was necessary to treat or prevent imminent or life-threatening  deterioration.  Critical care was time spent personally by me on the following activities: development of treatment plan with patient and/or surrogate as well as nursing, discussions with consultants, evaluation of patient's response to treatment, examination of patient, obtaining history from patient or surrogate, ordering and performing treatments and interventions, ordering and review of laboratory studies, ordering and review of radiographic studies, pulse oximetry and re-evaluation of patient's condition.  The patients results and plan were reviewed and discussed.   Any x-rays performed were independently reviewed by myself.   Differential diagnosis were considered with the presenting HPI.  Medications  ipratropium-albuterol (DUONEB) 0.5-2.5 (3) MG/3ML nebulizer solution 3 mL (3 mLs Nebulization Given 01/08/22 0156)  magnesium sulfate IVPB 2 g 50 mL (2 g Intravenous New Bag/Given 01/08/22 0205)  methylPREDNISolone sodium succinate (SOLU-MEDROL) 125 mg/2 mL injection 125 mg (125 mg Intravenous Given 01/08/22 0203)    Vitals:   01/08/22 0117 01/08/22 0141 01/08/22 0151 01/08/22 0154  BP: (!) 170/74     Pulse: 77     Resp: 18     Temp: 98.3 F (36.8 C)     TempSrc: Oral     SpO2: 97% 97% 95% 94%  Weight:      Height:        Final diagnoses:  COPD exacerbation (HCC)  Acute on chronic respiratory failure with hypoxia (HCC)    Admission/ observation were discussed with the admitting physician, patient and/or family and they are comfortable with the plan.          Final Clinical Impression(s) / ED Diagnoses Final diagnoses:  COPD exacerbation (HCC)  Acute on chronic respiratory failure with hypoxia [facility])    Rx / DC Orders ED Discharge Orders     None         Melene Plan, DO 01/08/22 2992

## 2022-01-08 NOTE — ED Notes (Signed)
Pt aware that she is being admitted , husband in room, pt requested food and drink,

## 2022-01-08 NOTE — H&P (Signed)
History and Physical    Patient: Adrienne Bennett IEP:[id number] DOB: April 20, 1961 DOA: 01/08/2022 DOS: the patient was seen and examined on 01/08/2022 PCP: [name], MD  Patient coming from: Home  Chief Complaint:  Chief Complaint  Patient presents with   Shortness of Breath   HPI: Adrienne Bennett is a 61 y.o. female with medical history significant of COPD, PTSD, bipolar disorder, diastolic dysfunction CHF, diabetes, diabetic retinopathy, hyperlipidemia, essential hypertension who presented to med [facility] with shortness of breath, cough and congestion.  No fever.  No nausea vomiting or diarrhea.  Patient felt like something was sitting in her chest.  Was unable to breathe for long.  Of time.  She has inhaler at home which she uses.  She went to the ER yesterday night and this morning.  Was seen and evaluated.  Despite initial treatment she continues to struggle to breathe and wheezes hence patient was sent to the ER for further evaluation and treatment.  She denied any sick contact.  Work-up did not show any pneumonia.  Patient appears to have severe COPD exacerbation and is being admitted for further evaluation and treatment. Review of Systems: As mentioned in the history of present illness. All other systems reviewed and are negative. 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     Essential hypertension 09/04/2010   Qualifier: Diagnosis of  By: [name] MD, [name].    GANGLION CYST 07/22/2010   Qualifier: Diagnosis of  By: [name] MD, Jose E.    GERD (gastroesophageal reflux disease) 05/23/2017   High cholesterol    Hyperlipidemia 10/12/2012   Hypertension    IBS (irritable bowel syndrome)    chronic Diarrhea   IBS ? 07/16/2008   Qualifier: Diagnosis of  By: [name] MD, [name].    Macular degeneration, dry    Migraine    "long long time ago; maybe 20 yr ago" (09/01/2013)   MIGRAINE HEADACHE 03/13/2010   Qualifier: Diagnosis of  By: [name] MD, Jose E.    Mouth sores 04/15/2017   Neck pain 02/03/2011   Osteoarthritis    "back; goes down my right leg" (09/01/2013)   Subclinical hyperthyroidism 02/26/2018   Thyromegaly 12/04/2014   Type II diabetes mellitus (HCC) 2007   Past Surgical History:  Procedure Laterality Date   CARPAL TUNNEL RELEASE Bilateral ~ 2000   cataract surgery Bilateral    TONSILLECTOMY  07/27/1968   TUBAL LIGATION  07/27/1994   Social History:  reports that she has never smoked. She has never used smokeless tobacco. She reports that she does not drink alcohol and does not use drugs.  Allergies  Allergen Reactions   Benztropine    Dairy Aid [Tilactase]    Lisinopril     Dc 06-2015, had lip swelling, stomatitis   Penicillins    Monistat 3 Combo Pack App  [Miconazole Nitrate] Itching and Rash  Family History  Problem Relation Age of Onset   Diabetes Mother    Diabetes Sister    CAD Sister    Hypertension Sister    Diabetes Maternal Grandmother    Coronary artery disease Father        age? 50s?   Coronary artery disease Brother        age?, 15s?   Hypertension Brother    Colon cancer Neg Hx    Breast cancer Neg Hx     Prior to Admission medications   Medication Sig Start Date End Date Taking? Authorizing Provider  albuterol (VENTOLIN HFA) 108 (90 Base) MCG/ACT inhaler Inhale 2 puffs into the lungs every 6 (six) hours as needed for  wheezing or shortness of breath. 02/26/21   [name], MD  amLODipine (NORVASC) 10 MG tablet TAKE 1 TABLET BY MOUTH  DAILY Patient taking differently: Take 10 mg by mouth daily. 09/24/21   [name], MD  aspirin EC 81 MG tablet Take 81 mg by mouth daily. Swallow whole.    [provider]  atorvastatin (LIPITOR) 40 MG tablet Take 1 tablet (40 mg total) by mouth at bedtime. 10/27/21   [name], MD  clonazePAM (KLONOPIN) 1 MG tablet Take 1 mg by mouth 2 (two) times daily. 1 tab AM, 1 tab PM, rx by psychiatry    [provider]  cyclobenzaprine (FLEXERIL) 10 MG tablet TAKE 1 TABLET BY MOUTH  TWICE DAILY AS NEEDED FOR  MUSCLE SPASM(S) Patient taking differently: Take 10 mg by mouth 3 (three) times daily as needed for muscle spasms. 05/19/21   [name], MD  divalproex (DEPAKOTE) 250 MG DR tablet Take 250 mg by mouth 2 (two) times daily. 1 tab AM, 2 tabs PM    [provider]  FIBER ADULT GUMMIES PO Take by mouth.    [provider]  furosemide (LASIX) 40 MG tablet Take 1 tablet (40 mg total) by mouth 2 (two) times daily. 05/02/21   Georgeanna Lea, MD  insulin degludec (TRESIBA) 200 UNIT/ML FlexTouch Pen Inject 100 Units into the skin at bedtime. If blood sugar consistently more than 200, increase to 120 units 05/02/20   Paz, [name], MD  ketoconazole (NIZORAL) 2 % cream Apply 1 application topically 2 (two) times daily. For 10 days 03/27/21   [name], MD  meloxicam (MOBIC) 7.5 MG tablet TAKE 1 TABLET BY MOUTH  DAILY AS NEEDED FOR PAIN Patient taking differently: Take 7.5 mg by mouth daily. 09/18/21   [name], MD  metFORMIN (GLUMETZA) 1000 MG (MOD) 24 hr tablet Take 1 tablet (1,000 mg total) by mouth 2 (two) times daily with a meal.    [name], MD  metoprolol tartrate (LOPRESSOR) 50 MG tablet TAKE 1 TABLET BY MOUTH TWICE  DAILY 11/18/21   [name], MD  tiZANidine (ZANAFLEX) 2 MG tablet Take 1 tablet (2 mg total) by mouth 3 (three) times daily. 12/26/21    [name], MD  Vitamin D, Ergocalciferol, (DRISDOL) 1.25 MG (50000 UNIT) CAPS capsule Take 1 capsule (50,000 Units total) by mouth every 7 (seven) days. 10/30/21   [name], MD    Physical Exam: Vitals:   01/08/22 1600 01/08/22 1700 01/08/22 1705 01/08/22 1811  BP: (!) 143/72 (!) 146/64 (!) 146/64 (!) 158/75  Pulse: 86 88 88 95  Resp: (!) 21  20 16   Temp:    98.6 F (37 C)  TempSrc:  Oral  SpO2: 96% 98% 96% 97%  Weight:      Height:       Generally: Stable in mild respiratory distress HEENT: PERRL, no pallor no jaundice no rhinorrhea Neck: Supple no JVD no lymphadenopathy Respiratory: Decreased air entry at the bases with mild expiratory wheezing Cardiovascular: S1-S2 no murmur Abdomen: Soft nontender with positive bowel sounds Extremities: No edema cyanosis or clubbing Neuro: Nonfocal exam  Data Reviewed:  Sodium is 136, potassium 3.5, chloride 95, CO2 of 30, glucose 221, BUN 26, creatinine 1.2 and calcium 8.2.  BNP 239.  CBC within normal.  Chest x-ray shows no acute finding except mild cardiogenic failure.  Assessment and Plan:  #1 acute respiratory failure with hypoxia: Secondary to COPD exacerbation.  Mild diastolic CHF exacerbation also noted.  Patient will be admitted.  She is currently on 2 L of oxygen.  IV steroid, nebulizer treatment and antibiotics.  #2 essential hypertension: Confirm on resume home regimen  #3 PTSD: Continue home regimen  #4 GERD: Continue with PPI  #5 diabetes: Sliding scale insulin  #6 hyperlipidemia: Continue home regimen  #7 severe insomnia: Patient reports not sleeping at night regardless of where she does.  At this point chronic for more than 40 years.   Advance Care Planning:   Code Status: Prior full  Consults: None  Family Communication: Husband at bedside  Severity of Illness: The appropriate patient status for this patient is INPATIENT. Inpatient status is judged to be reasonable and necessary in order to provide the  required intensity of service to ensure the patient's safety. The patient's presenting symptoms, physical exam findings, and initial radiographic and laboratory data in the context of their chronic comorbidities is felt to place them at high risk for further clinical deterioration. Furthermore, it is not anticipated that the patient will be medically stable for discharge from the hospital within 2 midnights of admission.   * I certify that at the point of admission it is my clinical judgment that the patient will require inpatient hospital care spanning beyond 2 midnights from the point of admission due to high intensity of service, high risk for further deterioration and high frequency of surveillance required.*  AuthorLonia Blood, MD 01/08/2022 6:23 PM  For on call review [email or web address].

## 2022-01-08 NOTE — ED Triage Notes (Signed)
Pt states she has chronic congestive heart failure and tonight laying in bed she became short of breath   Pt states she feels like someone is trying to squeeze her ribs together

## 2022-01-08 NOTE — Progress Notes (Signed)
       REMOTE CROSS COVER NOTE  NAME: Adrienne Bennett MRN: [id number] DOB : 12/19/1960    Date of Service   01/08/2022  HPI/Events of Note   Nursing reporting a localized rash after solumedrol administration through (R) forearm Peripheral IV. Patient reports this also occurred last night with solumedrol injection. Last night rash self resolved. Area is described by nursing as a red macular rash of (R)UE extending from the elbow down to the hand.   Interventions   Plan: 25 mg Benadryl Hold AM Solu-Medrol dose Hydrocortisone cream       Bishop Limbo DNP, MHA, FNP-BC Nurse Practitioner [facility] Pager [phone]

## 2022-01-08 NOTE — Progress Notes (Signed)
Transferring facility:  [facility] Requesting provider:  Dr. Melene Plan (EDP at [facility]) Reason for transfer: admission for further evaluation and management of suspected acute COPD exacerbation complicated by acute hypoxic respiratory distress.   61year old female with reported history of COPD, chronic diastolic heart failure, who presented to [facility] ED on 01/07/2022 complaining of 1 day of shortness of breath, in the absence of any associated orthopnea, worsening peripheral edema, chest pain, fever, chills.  She confirms no baseline supplemental oxygen requirements.  Vital signs in the ED were notable for the following: Afebrile; initial oxygen saturation 84% on room air, subsequently improving into the mid to high 90s on 2 L nasal cannula.  Labs notable for negative troponin.  Imaging notable for chest x-ray, which reportedly showed no evidence of acute cardiopulmonary process.  Medications administered prior to transfer included the following: Duo nebulizer treatments x 3, IV magnesium sulfate, Solu-Medrol.  Patient's shortness of breath reportedly improving with these measures, but not yet back to baseline, and still requiring 2 L nasal cannula in order to maintain oxygen saturation is greater than 92%.   Subsequently, I accepted this patient for transfer for inpatient admission to a med telemetry bed at [facility] for further work-up and management of suspected acute COPD exacerbation complicated by acute hypoxic respiratory distress..      Check [email or web address] for on-call coverage.   Nursing staff, Please call [facility] Admits & Consults System-Wide number on Amion as soon as patient's arrival, so appropriate admitting provider can evaluate the pt.     Newton Pigg, DO Hospitalist

## 2022-01-09 DIAGNOSIS — N179 Acute kidney failure, unspecified: Secondary | ICD-10-CM

## 2022-01-09 DIAGNOSIS — J441 Chronic obstructive pulmonary disease with (acute) exacerbation: Secondary | ICD-10-CM | POA: Diagnosis not present

## 2022-01-09 LAB — CBC WITH DIFFERENTIAL/PLATELET
Abs Immature Granulocytes: 0.05 10*3/uL (ref 0.00–0.07)
Basophils Absolute: 0 10*3/uL (ref 0.0–0.1)
Basophils Relative: 0 %
Eosinophils Absolute: 0 10*3/uL (ref 0.0–0.5)
Eosinophils Relative: 0 %
HCT: 35.6 % — ABNORMAL LOW (ref 36.0–46.0)
Hemoglobin: 11.2 g/dL — ABNORMAL LOW (ref 12.0–15.0)
Immature Granulocytes: 1 %
Lymphocytes Relative: 13 %
Lymphs Abs: 1.3 10*3/uL (ref 0.7–4.0)
MCH: 30.2 pg (ref 26.0–34.0)
MCHC: 31.5 g/dL (ref 30.0–36.0)
MCV: 96 fL (ref 80.0–100.0)
Monocytes Absolute: 0.1 10*3/uL (ref 0.1–1.0)
Monocytes Relative: 1 %
Neutro Abs: 8.4 10*3/uL — ABNORMAL HIGH (ref 1.7–7.7)
Neutrophils Relative %: 85 %
Platelets: 230 10*3/uL (ref 150–400)
RBC: 3.71 MIL/uL — ABNORMAL LOW (ref 3.87–5.11)
RDW: 14.3 % (ref 11.5–15.5)
WBC: 9.9 10*3/uL (ref 4.0–10.5)
nRBC: 0 % (ref 0.0–0.2)

## 2022-01-09 LAB — COMPREHENSIVE METABOLIC PANEL
ALT: 18 U/L (ref 0–44)
AST: 19 U/L (ref 15–41)
Albumin: 3.3 g/dL — ABNORMAL LOW (ref 3.5–5.0)
Alkaline Phosphatase: 67 U/L (ref 38–126)
Anion gap: 10 (ref 5–15)
BUN: 43 mg/dL — ABNORMAL HIGH (ref 8–23)
CO2: 32 mmol/L (ref 22–32)
Calcium: 8.6 mg/dL — ABNORMAL LOW (ref 8.9–10.3)
Chloride: 97 mmol/L — ABNORMAL LOW (ref 98–111)
Creatinine, Ser: 1.33 mg/dL — ABNORMAL HIGH (ref 0.44–1.00)
GFR, Estimated: 46 mL/min — ABNORMAL LOW (ref >60)
Glucose, Bld: 290 mg/dL — ABNORMAL HIGH (ref 70–99)
Potassium: 4.6 mmol/L (ref 3.5–5.1)
Sodium: 139 mmol/L (ref 135–145)
Total Bilirubin: 0.2 mg/dL — ABNORMAL LOW (ref 0.3–1.2)
Total Protein: 7.4 g/dL (ref 6.5–8.1)

## 2022-01-09 LAB — GLUCOSE, CAPILLARY
Glucose-Capillary: 307 mg/dL — ABNORMAL HIGH (ref 70–99)
Glucose-Capillary: 299 mg/dL — ABNORMAL HIGH (ref 70–99)
Glucose-Capillary: 325 mg/dL — ABNORMAL HIGH (ref 70–99)
Glucose-Capillary: 383 mg/dL — ABNORMAL HIGH (ref 70–99)

## 2022-01-09 LAB — HEMOGLOBIN A1C
Hgb A1c MFr Bld: 8.5 % — ABNORMAL HIGH (ref 4.8–5.6)
Mean Plasma Glucose: 197.25 mg/dL

## 2022-01-09 LAB — HIV ANTIBODY (ROUTINE TESTING W REFLEX): HIV Screen 4th Generation wRfx: NONREACTIVE

## 2022-01-09 MED ORDER — INSULIN ASPART 100 UNIT/ML IJ SOLN
5.0000 [IU] | Freq: Three times a day (TID) | INTRAMUSCULAR | Status: DC
  Administered 2022-01-09 – 2022-01-10 (×6): 5 [IU] via SUBCUTANEOUS

## 2022-01-09 MED ORDER — INSULIN ASPART 100 UNIT/ML IJ SOLN
0.0000 [IU] | Freq: Three times a day (TID) | INTRAMUSCULAR | Status: DC
  Administered 2022-01-09: 11 [IU] via SUBCUTANEOUS
  Administered 2022-01-09: 8 [IU] via SUBCUTANEOUS
  Administered 2022-01-09 – 2022-01-10 (×2): 11 [IU] via SUBCUTANEOUS
  Administered 2022-01-10: 3 [IU] via SUBCUTANEOUS
  Administered 2022-01-10: 2 [IU] via SUBCUTANEOUS

## 2022-01-09 MED ORDER — IPRATROPIUM-ALBUTEROL 0.5-2.5 (3) MG/3ML IN SOLN
3.0000 mL | Freq: Three times a day (TID) | RESPIRATORY_TRACT | Status: DC
  Administered 2022-01-09 – 2022-01-10 (×5): 3 mL via RESPIRATORY_TRACT
  Filled 2022-01-09 (×5): qty 3

## 2022-01-09 MED ORDER — HYDROCORTISONE 0.5 % EX CREA: TOPICAL_CREAM | Freq: Three times a day (TID) | CUTANEOUS | Status: DC | PRN

## 2022-01-09 MED ORDER — ADULT MULTIVITAMIN W/MINERALS CH
1.0000 | ORAL_TABLET | Freq: Every day | ORAL | Status: DC
Start: 2022-01-09 — End: 2022-01-10
  Administered 2022-01-09 – 2022-01-10 (×2): 1 via ORAL
  Filled 2022-01-09 (×2): qty 1

## 2022-01-09 NOTE — Evaluation (Signed)
Physical Therapy Evaluation Patient Details Name: Adrienne Bennett MRN: [id number] DOB: [birth year] Today's Date: 01/09/2022  History of Present Illness  Pt is 61 yo female who presents with SOB, cough and congestion. PMH includes COPD, PTSD, bipolar disorder, diastolic dysfunction CHF, diabetes, diabetic retinopathy, hyperlipidemia, essential HTN.  Clinical Impression  Pt is a 61 y.o. female with above HPI resulting in the deficits listed below (see PT Problem List). Pt performed sit to stand transfers and ambulation at supervision level. Pt ambulated total of ~160ft with x1 standing rest break. Pt on 2L [place] upon entry with O2 sats at 96%- reports no O2 use at baseline. O2 desat on RA to 83%, fluctuating between 83-91% during ambulation. At rest pt able to reocver to 88-92% on RA. Placed on 1L [place] and O2 sats 96-97%. Left on 1L at end of session, RN notified. Pt will benefit from skilled PT to maximize functional mobility to increase independence.         Recommendations for follow up therapy are one component of a multi-disciplinary discharge planning process, led by the attending physician.  Recommendations may be updated based on patient status, additional functional criteria and insurance authorization.  Follow Up Recommendations No PT follow up      01/09/22 1000  PT Visit Information  Last PT Received On 01/09/22  Assistance Needed +1  History of Present Illness Pt is a 61 y.o. female shortness of breath, cough and congestion admitted for acute COPD exacerbation. PMH significant for COPD, PTSD, bipolar disorder, diastolic dysfunction CHF, diabetes, diabetic retinopathy, hyperlipidemia, essential hypertension  Restrictions  Weight Bearing Restrictions No  Home Living  Family/patient expects to be discharged to: Private residence  Living Arrangements Spouse/significant other  Available Help at Discharge Family;Available PRN/intermittently  Type of Home House  Home Access Stairs to  enter  Entrance Stairs-Number of Steps 7  Entrance Stairs-Rails Can reach both  Home Layout Two level  Alternate Level Stairs-Number of Steps 13  Alternate Level Stairs-Rails Right  Bathroom Shower/Tub Walk-in Pension scheme manager Yes  Home Equipment Shower seat - built in  Additional Comments significant other works. gave quad cane to family, planning to get another one. Pt likes to sew and read.  Prior Function  Prior Level of Function  Independent/Modified Independent;Driving  Mobility Comments cane PRN, when feeling SOB/LEs weak.  Communication  Communication No difficulties  Pain Assessment  Pain Assessment No/denies pain  Cognition  Arousal/Alertness Awake/alert  Behavior During Therapy WFL for tasks assessed/performed  Overall Cognitive Status Within Functional Limits for tasks assessed  Upper Extremity Assessment  Upper Extremity Assessment Defer to OT evaluation  Lower Extremity Assessment  Lower Extremity Assessment Generalized weakness  Cervical / Trunk Assessment  Cervical / Trunk Assessment Normal  Bed Mobility  General bed mobility comments Sitting EOB upon entry  Transfers  Overall transfer level Needs assistance  Equipment used None  Transfers Sit to/from Stand  Sit to Stand Supervision  General transfer comment for safety  Ambulation/Gait  Ambulation/Gait assistance Supervision  Gait Distance (Feet) 120 Feet  Assistive device None  Gait Pattern/deviations Step-through pattern;Decreased stride length  General Gait Details no overt LOB observed. X1 standing rest break after initial 39ft. Pt on 2L [place] upon entry with O2 sats at 96%- reports no O2 use at baseline. O2 desat on RA to 83%, fluctuating between 83-91% during ambulation. At rest pt able to reocver to 88-92% on RA. Placed on 1L [place] and O2 sats 96-97%. Left  on 1L at end of session, RN notified.  Gait velocity decr  Balance  Overall balance assessment Needs assistance   Sitting-balance support No upper extremity supported;Feet supported  Sitting balance-Leahy Scale Good  Standing balance support No upper extremity supported  Standing balance-Leahy Scale Good  General Comments  General comments (skin integrity, edema, etc.) Discussed with pt and family on energy conservation, pacing of activity and awareness to symptoms to maximize safety, especially wiht mobility as pt demonstrating O2 desats when mobile.  PT - End of Session  Equipment Utilized During Treatment Gait belt;Oxygen  Activity Tolerance Patient tolerated treatment well  Patient left in chair;with call bell/phone within reach;with family/visitor present  Nurse Communication Mobility status  PT Assessment  PT Recommendation/Assessment Patient needs continued PT services  PT Visit Diagnosis Unsteadiness on feet (R26.81)  PT Problem List Decreased strength;Decreased activity tolerance;Decreased balance;Decreased mobility;Cardiopulmonary status limiting activity  PT Plan  PT Frequency (ACUTE ONLY) Min 3X/week  PT Treatment/Interventions (ACUTE ONLY) DME instruction;Gait training;Stair training;Functional mobility training;Therapeutic activities;Therapeutic exercise;Balance training;Patient/family education  AM-PAC PT "6 Clicks" Mobility Outcome Measure (Version 2)  Help needed turning from your back to your side while in a flat bed without using bedrails? 4  Help needed moving from lying on your back to sitting on the side of a flat bed without using bedrails? 3  Help needed moving to and from a bed to a chair (including a wheelchair)? 3  Help needed standing up from a chair using your arms (e.g., wheelchair or bedside chair)? 3  Help needed to walk in hospital room? 3  Help needed climbing 3-5 steps with a railing?  3  6 Click Score 19  Consider Recommendation of Discharge To: Home with [facility]  Progressive Mobility  What is the highest level of mobility based on the progressive mobility assessment?  Level 5 (Walks with assist in room/hall) - Balance while stepping forward/back and can walk in room with assist - Complete  Activity Ambulated with assistance in hallway  PT Recommendation  Follow Up Recommendations No PT follow up  Assistance recommended at discharge Intermittent Supervision/Assistance  Functional Status Assessment Patient has had a recent decline in their functional status and demonstrates the ability to make significant improvements in function in a reasonable and predictable amount of time.  PT equipment Other (comment) (Pt reports she is planning to get new quad cane)  Individuals Consulted  Consulted and Agree with Results and Recommendations Patient;Family member/caregiver  Family Member Consulted pt's spouse  Acute Rehab PT Goals  Patient Stated Goal Feel less short of breath when moving  PT Goal Formulation With patient/family  Time For Goal Achievement 01/23/22  Potential to Achieve Goals Good  PT Time Calculation  PT Start Time (ACUTE ONLY) 1005  PT Stop Time (ACUTE ONLY) 1038  PT Time Calculation (min) (ACUTE ONLY) 33 min  PT General Charges  $$ ACUTE PT VISIT 1 Visit  PT Evaluation  $PT Eval Low Complexity 1 Low  PT Treatments  $Therapeutic Activity 8-22 mins  Written Expression  Dominant Hand Right        Lyman Speller PT, DPT  Acute Rehabilitation Services  Office [phone]  01/09/2022, 12:26 PM

## 2022-01-09 NOTE — Hospital Course (Addendum)
61 year old female with COPD, PTSD/bipolar disorder, diastolic dysfunction, CHF, diabetes mellitus, diabetic retinopathy hyperlipidemia hypertension presented to the ED with shortness of breath, squeezing sensation on her ribs, symptoms typical of her reactive airway disease.  Denied cough congestion fever. Seen in the ED, had significantly diminished breath sounds with prolonged expiratory phase, chest x-ray without any focal infiltrate, initially hypoxic on room air in the mid to low 80s placed on 2 L nasal cannula with improvement, EKG sinus rhythm was given Solu-Medrol Maxalt DuoNebx3 and was admitted for further management

## 2022-01-09 NOTE — Progress Notes (Signed)
PROGRESS NOTE Adrienne Bennett  QIH:[id number] DOB: [birth year] DOA: 01/08/2022 PCP: [name], MD   Brief Narrative/Hospital Course: 61 year old female with COPD, PTSD/bipolar disorder, diastolic dysfunction, CHF, diabetes mellitus, diabetic retinopathy hyperlipidemia hypertension presented to the ED with shortness of breath, squeezing sensation on her ribs, symptoms typical of her reactive airway disease.  Denied cough congestion fever. Seen in the ED, had significantly diminished breath sounds with prolonged expiratory phase, chest x-ray without any focal infiltrate, initially hypoxic on room air in the mid to low 80s placed on 2 L nasal cannula with improvement, EKG sinus rhythm was given Solu-Medrol Maxalt DuoNebx3 and was admitted for further management    Subjective: Seen this am Husband at the bedside. Feels much better, rash on right arm at the site of IV heparin daily Solu-Medrol has cleared up.   Assessment and Plan: Principal Problem:   Acute exacerbation of chronic obstructive pulmonary disease (COPD) (HCC) Active Problems:   Essential hypertension   Bipolar affective disorder (HCC)   Hyperlipidemia   GERD (gastroesophageal reflux disease)   Diabetic retinopathy (HCC)   Congestive heart failure (HCC)   AKI (acute kidney injury) (HCC)   Acute exacerbation of chronic obstructive pulmonary disease:feels much better now. Still needing [place], at home on RA. 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 Continue oral prednisone doxycycline, bronchodilators supplemental oxygen wean as tolerated.   Mild AKI creatinine at baseline 1.0 currently 1.3 -watch closely while on Lasix-hold today dose Recent Labs  Lab 01/08/22 0129 01/09/22 0545  BUN 26* 43*  CREATININE 1.21* 1.33*    Essential hypertension: BP well controlled on amlodipine, metoprolol Bipolar affective disorder: Mood is stable continue Klonopin, Depakote muscle relaxant,  melatonin.  Hyperlipidemia: Continue statin  GERD: Continue PPI  Type 2 diabetes mellitus on long-term insulin with uncontrolled hyperglycemia w/ diabetic retinopathy: sugar in 300s, sugar high from steroid use.  At home on 50 units Tresiba at bedtime, add Premeal insulin, SSI and adjust long-acting as needed.  Update HbA1c Recent Labs  Lab 01/08/22 2128 01/09/22 0732  GLUCAP 303* 307*    Diastolic Congestive heart failure-chest x-ray no congestion BNP slightly up resume Lasix once creatinine better   Rash localized-resolved with Benadryl hydrocortisone cream to continue, off IV Solu-Medrol unclear if it is the cause.  DVT prophylaxis: enoxaparin (LOVENOX) injection 40 mg Start: 01/08/22 2200 Code Status:   Code Status: Full Code Family Communication: plan of care discussed with patient/husband at bedside. Patient status is: inpatient because of copd management Level of care: Telemetry Medical   Dispo: The patient is from: home            Anticipated disposition: home in 1-2 days  Mobility Assessment (last 72 hours)     Mobility Assessment     Row Name 01/08/22 1758           Does patient have an order for bedrest or is patient medically unstable No - Continue assessment       What is the highest level of mobility based on the progressive mobility assessment? Level 5 (Walks with assist in room/hall) - Balance while stepping forward/back and can walk in room with assist - Complete                 Objective: Vitals last 24 hrs: Vitals:   01/08/22 1811 01/08/22 2142 01/08/22 2311 01/09/22 0530  BP: (!) 158/75 (!) 145/70 (!) 142/73 138/72  Pulse: 95 89 69 64  Resp:  16 19 18 16   Temp: 98.6 F (37 C) 98.6 F (37 C) 98 F (36.7 C) 97.7 F (36.5 C)  TempSrc: Oral Oral Oral Oral  SpO2: 97% 99% 99% 96%  Weight:      Height:       Weight change:   Physical Examination: General exam: alert awake,older than stated age, weak appearing. HEENT:Oral mucosa moist,  Ear/Nose WNL grossly, dentition normal. Respiratory system: bilaterally diminished BS, no use of accessory muscle Cardiovascular system: S1 & S2 +, No JVD. Gastrointestinal system: Abdomen soft,NT,ND, BS+ Nervous System:Alert, awake, moving extremities and grossly nonfocal Extremities: LE edema neg,distal peripheral pulses palpable.  Skin: No rashes,no icterus. MSK: Normal muscle bulk,tone, power  Medications reviewed:  Scheduled Meds:  amLODipine  10 mg Oral Daily   aspirin EC  81 mg Oral Daily   atorvastatin  40 mg Oral Daily   clonazePAM  1 mg Oral BID   cyclobenzaprine  10 mg Oral BID   divalproex  250 mg Oral Daily   divalproex  500 mg Oral QHS   doxycycline  100 mg Oral Q12H   enoxaparin (LOVENOX) injection  40 mg Subcutaneous Q24H   furosemide  40 mg Oral BID   insulin aspart  0-15 Units Subcutaneous TID WC   insulin aspart  0-5 Units Subcutaneous QHS   insulin aspart  5 Units Subcutaneous TID WC   insulin glargine-yfgn  50 Units Subcutaneous QHS   ipratropium-albuterol  3 mL Nebulization TID   melatonin  10 mg Oral QHS   meloxicam  7.5 mg Oral Daily   metoprolol tartrate  75 mg Oral BID   [START ON 01/10/2022] predniSONE  40 mg Oral Q breakfast   tiZANidine  2 mg Oral TID   [START ON 01/14/2022] Vitamin D (Ergocalciferol)  50,000 Units Oral Q7 days   Continuous Infusions:    Diet Order             Diet Carb Modified Fluid consistency: Thin; Room service appropriate? Yes  Diet effective now                           No intake or output data in the 24 hours ending 01/09/22 0946 Net IO Since Admission: 43.15 mL [01/09/22 0946]  Wt Readings from Last 3 Encounters:  01/08/22 64.9 kg  12/25/21 68.6 kg  10/27/21 67.7 kg     Unresulted Labs (From admission, onward)     Start     Ordered   01/15/22 0500  Creatinine, serum  (enoxaparin (LOVENOX)    CrCl >/= 30 ml/min)  Weekly,   R     Comments: while on enoxaparin therapy    01/08/22 1824   01/08/22 1825   HIV Antibody (routine testing w rflx)  (HIV Antibody (Routine testing w reflex) panel)  Once,   R        01/08/22 1824          Data Reviewed: I have personally reviewed following labs and imaging studies CBC: Recent Labs  Lab 01/08/22 0129 01/09/22 0545  WBC 9.2 9.9  NEUTROABS 5.6 8.4*  HGB 12.1 11.2*  HCT 37.0 35.6*  MCV 92.5 96.0  PLT 240 230   Basic Metabolic Panel: Recent Labs  Lab 01/08/22 0129 01/09/22 0545  NA 136 139  K 3.5 4.6  CL 95* 97*  CO2 30 32  GLUCOSE 221* 290*  BUN 26* 43*  CREATININE 1.21* 1.33*  CALCIUM 8.2* 8.6*  GFR: Estimated Creatinine Clearance: 38.3 mL/min (A) (by C-G formula based on SCr of 1.33 mg/dL (H)). Liver Function Tests: Recent Labs  Lab 01/09/22 0545  AST 19  ALT 18  ALKPHOS 67  BILITOT 0.2*  PROT 7.4  ALBUMIN 3.3*   No results for input(s): "LIPASE", "AMYLASE" in the last 168 hours. No results for input(s): "AMMONIA" in the last 168 hours. Coagulation Profile: No results for input(s): "INR", "PROTIME" in the last 168 hours. BNP (last 3 results) No results for input(s): "PROBNP" in the last 8760 hours. HbA1C: No results for input(s): "HGBA1C" in the last 72 hours. CBG: Recent Labs  Lab 01/08/22 2128 01/09/22 0732  GLUCAP 303* 307*   Lipid Profile: No results for input(s): "CHOL", "HDL", "LDLCALC", "TRIG", "CHOLHDL", "LDLDIRECT" in the last 72 hours. Thyroid Function Tests: No results for input(s): "TSH", "T4TOTAL", "FREET4", "T3FREE", "THYROIDAB" in the last 72 hours. Sepsis Labs: No results for input(s): "PROCALCITON", "LATICACIDVEN" in the last 168 hours.  No results found for this or any previous visit (from the past 240 hour(s)).  Antimicrobials: Anti-infectives (From admission, onward)    Start     Dose/Rate Route Frequency Ordered Stop   01/08/22 2200  doxycycline (VIBRA-TABS) tablet 100 mg        100 mg Oral Every 12 hours 01/08/22 1824 01/13/22 2159      Culture/Microbiology No results found  for: "SDES", "SPECREQUEST", "CULT", "REPTSTATUS"  Other culture-see note  Radiology Studies: DG Chest Port 1 View  Result Date: 01/08/2022 CLINICAL DATA:  Dyspnea, congestive heart failure EXAM: PORTABLE CHEST 1 VIEW COMPARISON:  11/27/2020 FINDINGS: The lungs are symmetrically well expanded. Mild bilateral perihilar interstitial pulmonary infiltrate is present in keeping with changes of mild cardiogenic failure. No pneumothorax. Small left pleural effusion is not excluded. Mild cardiomegaly is stable. No acute bone abnormality IMPRESSION: Mild cardiogenic failure. Electronically Signed   By: Helyn Numbers M.D.   On: 01/08/2022 01:42     LOS: 1 day   Lanae Boast, MD [facility]  01/09/2022, 9:46 AM

## 2022-01-09 NOTE — Progress Notes (Signed)
  Transition of Care [facility]) Screening Note   Patient Details  Name: Adrienne Bennett Date of Birth: September 14, 1960   Transition of Care [facility]) CM/SW Contact:    Daylee Delahoz, Meriam Sprague, RN Phone Number: 01/09/2022, 12:48 PM    Transition of Care Department [facility]) has reviewed patient and no TOC needs have been identified at this time. We will continue to monitor patient advancement through interdisciplinary progression rounds. If new patient transition needs arise, please place a TOC consult.

## 2022-01-09 NOTE — Care Plan (Signed)
Patient is here with COPD exacerbation. On SOLU-MEDROL. She received a dose at 2130. She has a red spotted rash that's not raised on her right hand up to her elbow. She reports that the same thing happened yesterday when she got the same medication and might be an allergic reaction.  She has an IV on the right forearm with blood return. She denies any chest tightness. vital signs in Epic. Provider notified and IV benadryl was administered.

## 2022-01-09 NOTE — Progress Notes (Signed)
Initial Nutrition Assessment  INTERVENTION:   -Provided "Carbohydrate Counting" handout  for patient and reviewed  -Multivitamin with minerals daily  NUTRITION DIAGNOSIS:   Increased nutrient needs related to chronic illness (COPD) as evidenced by estimated needs.  GOAL:   Patient will meet greater than or equal to 90% of their needs  MONITOR:   PO intake, Labs, Weight trends, I & O's  REASON FOR ASSESSMENT:   Consult COPD Protocol  ASSESSMENT:   61 y.o. female with medical history significant of COPD, PTSD, bipolar disorder, diastolic dysfunction CHF, diabetes, diabetic retinopathy, hyperlipidemia, essential hypertension who presented to med [facility] with shortness of breath, cough and congestion.  Patient in room with family at bedside. Sitting in chair, reviewing menu. Pt reports she is confused as to what foods she can have in a CHO modified diet. Pt reports her blood sugars have been in the 300 range. She drinks regular sodas at home. Reports eating consistently at home but has random bouts of nausea which she doesn't understand. Question if this is related to elevated blood sugars. Has not been vomiting. Has a history of reflux but denies symptoms currently.   Provided "Carbohydrate Counting" handout to review as pt does not follow a diabetes diet at home.   Per weight records, pt has lost 5 lbs since 4/3 (3% wt loss x 2.5 months, insignificant for time frame).  Medications: Vitamin D  Labs reviewed: CBGs: 303-307  NUTRITION - FOCUSED PHYSICAL EXAM:  Flowsheet Row Most Recent Value  Orbital Region No depletion  Upper Arm Region No depletion  Thoracic and Lumbar Region Mild depletion  Buccal Region No depletion  Temple Region No depletion  Clavicle Bone Region Mild depletion  Clavicle and Acromion Bone Region Mild depletion  Scapular Bone Region No depletion  Dorsal Hand Mild depletion  Patellar Region No depletion  Anterior Thigh Region No depletion   Posterior Calf Region No depletion  Edema (RD Assessment) None  Hair Reviewed  Eyes Reviewed  Mouth Reviewed  Skin Reviewed       Diet Order:   Diet Order             Diet Carb Modified Fluid consistency: Thin; Room service appropriate? Yes  Diet effective now                   EDUCATION NEEDS:   Education needs have been addressed  Skin:  Skin Assessment: Reviewed RN Assessment  Last BM:  6/14  Height:   Ht Readings from Last 1 Encounters:  01/08/22 5\' 1"  (1.549 m)    Weight:   Wt Readings from Last 1 Encounters:  01/08/22 64.9 kg    BMI:  Body mass index is 27.02 kg/m.  Estimated Nutritional Needs:   Kcal:  1700-1900  Protein:  75-90g  Fluid:  1.9L/day   01/10/22, MS, RD, LDN Inpatient Clinical Dietitian Contact information available via Amion

## 2022-01-09 NOTE — Evaluation (Signed)
Occupational Therapy Evaluation Patient Details Name: Adrienne Bennett MRN: [id number] DOB: [birth year] Today's Date: 01/09/2022   History of Present Illness Pt is 61 yo female who presents with SOB, cough and congestion. PMH includes COPD, PTSD, bipolar disorder, diastolic dysfunction CHF, diabetes, diabetic retinopathy, hyperlipidemia, essential HTN.   Clinical Impression   Pt admitted with the above diagnoses and presents with below problem list. Pt will benefit from continued acute OT to address the below listed deficits and maximize independence with basic ADLs prior to d/c home. At baseline, pt is mod I to independent with basic ADLs, endorses struggling with flight of stairs, spouse walks ahead of her on the stairs. Pt currently needs up to min guard assist with LB ADLs and functional mobility. Noted to seek single UE support intermittently while walking in the room. Pt on 1L O2 throughout session; walked 2 laps in the room maintained SpO2 >95. Pt endorses SpO2 drops when she walks longer distances (ie in the hallway). Husband present throughout session.        Recommendations for follow up therapy are one component of a multi-disciplinary discharge planning process, led by the attending physician.  Recommendations may be updated based on patient status, additional functional criteria and insurance authorization.   Follow Up Recommendations  Home health OT    Assistance Recommended at Discharge Intermittent Supervision/Assistance  Patient can return home with the following Help with stairs or ramp for entrance;Assist for transportation    Functional Status Assessment  Patient has had a recent decline in their functional status and demonstrates the ability to make significant improvements in function in a reasonable and predictable amount of time.  Equipment Recommendations  None recommended by OT    Recommendations for Other Services       Precautions / Restrictions  Precautions Precautions: Fall Restrictions Weight Bearing Restrictions: No      Mobility Bed Mobility               General bed mobility comments: up in chair    Transfers Overall transfer level: Needs assistance   Transfers: Sit to/from Stand Sit to Stand: Min guard           General transfer comment: to/from standard vistor's chair (pt preference). extra time and effort, seeks up to single UE support.      Balance Overall balance assessment: Needs assistance Sitting-balance support: No upper extremity supported, Feet supported Sitting balance-Leahy Scale: Good     Standing balance support: No upper extremity supported, Single extremity supported Standing balance-Leahy Scale: Poor Standing balance comment: can static stand with no UE support, seeks intermittent single UE support in dynamic standing/mobility                           ADL either performed or assessed with clinical judgement   ADL Overall ADL's : Needs assistance/impaired Eating/Feeding: Set up   Grooming: Supervision/safety;Standing;Set up;Sitting   Upper Body Bathing: Set up;Sitting   Lower Body Bathing: Min guard;Sit to/from stand   Upper Body Dressing : Set up;Sitting   Lower Body Dressing: Min guard;Sit to/from stand   Toilet Transfer: Min guard;Ambulation   Toileting- Clothing Manipulation and Hygiene: Min guard;Sit to/from stand;Sitting/lateral lean   Tub/ Shower Transfer: Walk-in shower;Min guard;Ambulation   Functional mobility during ADLs: Min guard General ADL Comments: Pt completed household distance functional mobility (2 laps in room) with intermittent single UE support sought especially on turns. Pt on 1L throughout session with SpO2  staying in the mid 90s. Pt reports O2 drops with walks in the hallway (ex community distance functional mobility). Began energy conservation education.     Vision Baseline Vision/History: 1 Wears glasses       Perception      Praxis      Pertinent Vitals/Pain Pain Assessment Pain Assessment: No/denies pain     Hand Dominance Right   Extremity/Trunk Assessment Upper Extremity Assessment Upper Extremity Assessment: Overall WFL for tasks assessed   Lower Extremity Assessment Lower Extremity Assessment: Defer to PT evaluation       Communication Communication Communication: No difficulties   Cognition Arousal/Alertness: Awake/alert Behavior During Therapy: WFL for tasks assessed/performed Overall Cognitive Status: Within Functional Limits for tasks assessed                                       General Comments  Husband present throughout session    Exercises     Shoulder Instructions      Home Living Family/patient expects to be discharged to:: Private residence Living Arrangements: Spouse/significant other Available Help at Discharge: Family;Available PRN/intermittently Type of Home: House Home Access: Stairs to enter [employer] of Steps: 7 Entrance Stairs-Rails: Can reach both Home Layout: Two level Alternate Level Stairs-Number of Steps: 13 Alternate Level Stairs-Rails: Right Bathroom Shower/Tub: Producer, television/film/video: Standard Bathroom Accessibility: Yes   Home Equipment: Shower seat - built in   Additional Comments: significant other works. gave quad cane to family, planning to get another one. LIkes to sew and read.      Prior Functioning/Environment Prior Level of Function : Independent/Modified Independent;Driving             Mobility Comments: cane PRN, when feeling SOB/LEs weak.          OT Problem List: Decreased strength;Decreased activity tolerance;Impaired balance (sitting and/or standing);Decreased knowledge of use of DME or AE;Decreased knowledge of precautions;Cardiopulmonary status limiting activity      OT Treatment/Interventions: Self-care/ADL training;Energy conservation;DME and/or AE instruction;Therapeutic  activities;Patient/family education;Balance training    OT Goals(Current goals can be found in the care plan section) Acute Rehab OT Goals Patient Stated Goal: home soon OT Goal Formulation: With patient/family Time For Goal Achievement: 01/23/22 Potential to Achieve Goals: Good ADL Goals Pt Will Perform Grooming: with supervision;standing Pt Will Perform Lower Body Bathing: with modified independence;sit to/from stand Pt Will Perform Tub/Shower Transfer: with supervision;ambulating;shower seat  OT Frequency: Min 2X/week    Co-evaluation              AM-PAC OT "6 Clicks" Daily Activity     Outcome Measure Help from another person eating meals?: None Help from another person taking care of personal grooming?: None Help from another person toileting, which includes using toliet, bedpan, or urinal?: A Little Help from another person bathing (including washing, rinsing, drying)?: A Little Help from another person to put on and taking off regular upper body clothing?: None Help from another person to put on and taking off regular lower body clothing?: A Little 6 Click Score: 21   End of Session Equipment Utilized During Treatment: Oxygen (1L)  Activity Tolerance: Patient tolerated treatment well Patient left: in chair;with call bell/phone within reach;with family/visitor present  OT Visit Diagnosis: Unsteadiness on feet (R26.81);Muscle weakness (generalized) (M62.81)                Time: 1130-1150 OT Time Calculation (  min): 20 min Charges:  OT General Charges $OT Visit: 1 Visit OT Evaluation $OT Eval Low Complexity: 1 Low  Raynald Kemp, OT Acute Rehabilitation Services Office: [phone]   Pilar Grammes 01/09/2022, 12:12 PM

## 2022-01-10 DIAGNOSIS — J9601 Acute respiratory failure with hypoxia: Secondary | ICD-10-CM

## 2022-01-10 LAB — BASIC METABOLIC PANEL
Anion gap: 9 (ref 5–15)
BUN: 62 mg/dL — ABNORMAL HIGH (ref 8–23)
CO2: 33 mmol/L — ABNORMAL HIGH (ref 22–32)
Calcium: 8.6 mg/dL — ABNORMAL LOW (ref 8.9–10.3)
Chloride: 96 mmol/L — ABNORMAL LOW (ref 98–111)
Creatinine, Ser: 1.39 mg/dL — ABNORMAL HIGH (ref 0.44–1.00)
GFR, Estimated: 43 mL/min — ABNORMAL LOW (ref >60)
Glucose, Bld: 149 mg/dL — ABNORMAL HIGH (ref 70–99)
Potassium: 3.9 mmol/L (ref 3.5–5.1)
Sodium: 138 mmol/L (ref 135–145)

## 2022-01-10 LAB — GLUCOSE, CAPILLARY
Glucose-Capillary: 136 mg/dL — ABNORMAL HIGH (ref 70–99)
Glucose-Capillary: 195 mg/dL — ABNORMAL HIGH (ref 70–99)
Glucose-Capillary: 307 mg/dL — ABNORMAL HIGH (ref 70–99)

## 2022-01-10 MED ORDER — DOXYCYCLINE HYCLATE 100 MG PO TABS: 100.0000 mg | ORAL_TABLET | Freq: Two times a day (BID) | ORAL | 0 refills | Status: AC

## 2022-01-10 MED ORDER — PREDNISONE 10 MG PO TABS
ORAL_TABLET | ORAL | 0 refills | Status: DC
Start: 2022-01-11 — End: 2022-01-19

## 2022-01-10 MED ORDER — IPRATROPIUM-ALBUTEROL 0.5-2.5 (3) MG/3ML IN SOLN: 3.0000 mL | Freq: Three times a day (TID) | RESPIRATORY_TRACT | 0 refills | Status: DC | PRN

## 2022-01-10 MED ORDER — FLUTICASONE FUROATE-VILANTEROL 100-25 MCG/ACT IN AEPB: 1.0000 | INHALATION_SPRAY | Freq: Every day | RESPIRATORY_TRACT | 0 refills | Status: AC

## 2022-01-10 MED ORDER — FUROSEMIDE 40 MG PO TABS: 40.0000 mg | ORAL_TABLET | Freq: Every day | ORAL | 0 refills | Status: DC

## 2022-01-10 MED ORDER — INCRUSE ELLIPTA 62.5 MCG/ACT IN AEPB: 1.0000 | INHALATION_SPRAY | Freq: Every day | RESPIRATORY_TRACT | 0 refills | Status: AC

## 2022-01-10 NOTE — Progress Notes (Signed)
Patient was 98% on RA, upon walking dropped to 78%, once sitting back down recovered to 92%.

## 2022-01-10 NOTE — Progress Notes (Signed)
Reviewed discharge paperwork with patient. Went over medication regimen, follow up appointments, and oxygen requirements. Employee from [employer] reviewed with patient and family how to operate oxygen equipment. Notified patient of where to pick up prescriptions.Educated patient she should be on 2L of oxygen.

## 2022-01-10 NOTE — TOC Progression Note (Signed)
Transition of Care [facility]) - Progression Note    Patient Details  Name: Adrienne Bennett MRN: [id number] Date of Birth: 1961-05-01  [facility]) CM/SW Contact  Geni Bers, RN Phone Number: 01/10/2022, 2:24 PM  Clinical Narrative:     Spoke with pt concerning Oxygen. [employer] was selected. Referral given to in house rep.        Expected Discharge Plan and Services           Expected Discharge Date: 01/10/22                                     Social Determinants of Health (SDOH) Interventions    Readmission Risk Interventions     No data to display

## 2022-01-10 NOTE — Discharge Summary (Signed)
+Physician Discharge Summary  CORIAN RUSE [id number] DOB: 1960/11/03 DOA: 01/08/2022  PCP: Colon Branch, MD  Admit date: 01/08/2022 Discharge date: 01/10/2022 Recommendations for Outpatient Follow-up:  Follow up with PCP in 1 weeks-call for appointment Please obtain BMP/CBC in one week  Discharge Dispo: Home Discharge Condition: Stable Code Status:   Code Status: Full Code Diet recommendation:  Diet Order             Diet Carb Modified Fluid consistency: Thin; Room service appropriate? Yes  Diet effective now                    Brief/Interim Summary: 61 year old female with COPD, PTSD/bipolar disorder, diastolic dysfunction, CHF, diabetes mellitus, diabetic retinopathy hyperlipidemia hypertension presented to the ED with shortness of breath, squeezing sensation on her ribs, symptoms typical of her reactive airway disease.  Denied cough congestion fever. Seen in the ED, had significantly diminished breath sounds with prolonged expiratory phase, chest x-ray without any focal infiltrate, initially hypoxic on room air in the mid to low 80s placed on 2 L nasal cannula with improvement, EKG sinus rhythm was given Solu-Medrol Maxalt DuoNebx3 and was admitted for further management  At this time she is clinically improved needing supplemental oxygen with ambulation, wheezing is improved we will keep her on steroid taper, antibiotic course,inhaler/nebulizer .  She is instructed to follow-up with her doctor.  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  Respiratory status much improved but needing supplemental oxygen 2 L especially with ambulation will qualify for home oxygen AT discharge,advised follow-up with PCP.  Complete antibiotics course, prednisone taper, continue bronchodilators nebs inhalers-prescription sent.   Mild AKI vs CKD IIIb- creatinine at 1.3-advised to hold off metformin, decrease Lasix dose repeat BMP from PCP in a week  Recent Labs  Lab 01/08/22 0129 01/09/22 0545 01/10/22 0627  BUN 26* 43* 62*  CREATININE 1.21* 1.33* 1.39*    Essential hypertension: BP well controlled on amlodipine, metoprolol  Bipolar affective disorder: Mood is stable continue Klonopin, Depakote muscle relaxant, melatonin.   Hyperlipidemia: Continue statin   GERD: Continue PPI   Type 2 diabetes mellitus on long-term insulin with uncontrolled hyperglycemia w/ diabetic retinopathy: sugar in 300s, sugar high from steroid use.  At home on 50 units Tresiba at bedtime, quickly tapering off steroids patient instructed to monitor blood sugar closely at home follow-up with good adjust long acting insulin while on IV steroids patient to follow-up with PCP Recent Labs  Lab 01/09/22 0545 01/09/22 0732 01/09/22 1223 01/09/22 1735 01/09/22 2057 01/10/22 0714  GLUCAP  --  307* 299* 325* 383* 136*  HGBA1C 8.5*  --   --   --   --   --      Diastolic Congestive heart failure-chest x-ray no congestion BNP slightly up resume Lasix once a day at home monitoring kidney functions with PCP follow-up with cardiology Dr. Roderic Palau   Rash localized-resolved with Benadryl hydrocortisone cream to continue, off IV Solu-Medrol unclear if it is the cause/could be  local site reaction  Consults: none Subjective: Alert awake oriented resting comfortably doing well on 2 L nasal cannula not dyspneic, no chest pain no shortness of breath  Discharge Exam: Vitals:    01/10/22 0527 01/10/22 0744  BP: 116/73   Pulse: (!) 58   Resp: 14   Temp: 97.8 F (36.6 C)   SpO2: 93% 95%   General: Pt is alert, awake, not in acute distress Cardiovascular: RRR, S1/S2 +, no rubs, no gallops Respiratory: CTA bilaterally, no wheezing, no rhonchi Abdominal: Soft, NT, ND, bowel sounds + Extremities: no edema, no cyanosis  Discharge Instructions  Discharge Instructions     Discharge instructions   Complete by: As directed    Once you see your doctor and get bmp done consider going back on bid dosing on lasix or sooner if you have worsening shortness of breath or worsening leg edema.  Please call call MD or return to ER for similar or worsening recurring problem that brought you to hospital or if any fever,nausea/vomiting,abdominal pain, uncontrolled pain, chest pain,  shortness of breath or any other alarming symptoms.  Please follow-up your doctor as instructed in a week time and call the office for appointment.  Please avoid alcohol, smoking, or any other illicit substance and maintain healthy habits including taking your regular medications as prescribed.  You were cared for by a hospitalist during your hospital stay. If you have any questions about your discharge medications or the care you received while you were in the hospital after you are discharged, you can call the unit and ask to speak with the hospitalist on call if the hospitalist that took care of you is not available.  Once you are discharged, your primary care physician will handle any further medical issues. Please note that NO REFILLS for any discharge medications will be authorized once you are discharged, as it is imperative that you return to your primary care physician (or establish a relationship with a primary care physician if you do not have one) for your aftercare needs so that they can reassess your need for medications and monitor your lab values.  Check blood sugar 3 times a day and  bedtime at home. If blood sugar running above 200 less than 70 please call your MD to adjust insulin. If blood sugars running less 100 do not use insulin and call MD. If you noticed signs and symptoms of hypoglycemia or low blood sugar like jitteriness, confusion, thirst, tremor, sweating- Check blood sugar, drink sugary drink/biscuits/sweets to increase sugar level and call MD or return to ER.   For home use only DME Nebulizer machine   Complete by: As directed    Patient needs a nebulizer to treat with the following condition: COPD (chronic obstructive pulmonary disease) ([place])   Length of Need: Lifetime   Increase activity slowly   Complete by: As directed       Allergies as of 01/10/2022       Reactions   Benztropine    Dairy Aid [tilactase]    Lisinopril    Dc 06-2015, had lip swelling, stomatitis   Penicillins    Xanax [alprazolam] Other (See Comments)   seizure   Monistat 3 Combo Pack App  [miconazole Nitrate] Itching, Rash        Medication List     STOP taking these medications    meloxicam 7.5 MG tablet Commonly known as: MOBIC   metFORMIN 1000 MG (MOD) 24 hr tablet Commonly known as: GLUMETZA  TAKE these medications    albuterol 108 (90 Base) MCG/ACT inhaler Commonly known as: VENTOLIN HFA Inhale 2 puffs into the lungs every 6 (six) hours as needed for wheezing or shortness of breath.   amLODipine 10 MG tablet Commonly known as: NORVASC TAKE 1 TABLET BY MOUTH  DAILY   aspirin EC 81 MG tablet Take 81 mg by mouth daily. Swallow whole.   atorvastatin 40 MG tablet Commonly known as: LIPITOR Take 1 tablet (40 mg total) by mouth at bedtime. What changed: when to take this   clonazePAM 1 MG tablet Commonly known as: KLONOPIN Take 1 mg by mouth 2 (two) times daily. 1 tab AM, 1 tab PM, rx by psychiatry   cyclobenzaprine 10 MG tablet Commonly known as: FLEXERIL TAKE 1 TABLET BY MOUTH  TWICE DAILY AS NEEDED FOR  MUSCLE SPASM(S)   divalproex 250  MG DR tablet Commonly known as: DEPAKOTE Take 250-500 mg by mouth 2 (two) times daily. 1 tab AM, 2 tabs PM   doxycycline 100 MG tablet Commonly known as: VIBRA-TABS Take 1 tablet (100 mg total) by mouth every 12 (twelve) hours for 5 days.   fluticasone furoate-vilanterol 100-25 MCG/ACT Aepb Commonly known as: Breo Ellipta Inhale 1 puff into the lungs daily.   furosemide 40 MG tablet Commonly known as: LASIX Take 1 tablet (40 mg total) by mouth daily for 3 days. Once you see your doctor and get bmp done consider going back on bid dosing. What changed:  when to take this additional instructions   Incruse Ellipta 62.5 MCG/ACT Aepb Generic drug: umeclidinium bromide Inhale 1 puff into the lungs daily.   insulin degludec 200 UNIT/ML FlexTouch Pen Commonly known as: TRESIBA Inject 50 Units into the skin at bedtime.   ipratropium-albuterol 0.5-2.5 (3) MG/3ML Soln Commonly known as: DUONEB Take 3 mLs by nebulization 3 (three) times daily as needed.   ketoconazole 2 % cream Commonly known as: NIZORAL Apply 1 application topically 2 (two) times daily. For 10 days What changed:  when to take this reasons to take this additional instructions   Melatonin 10 MG Tabs Take 10 mg by mouth at bedtime.   metoprolol tartrate 50 MG tablet Commonly known as: LOPRESSOR TAKE 1 TABLET BY MOUTH TWICE  DAILY What changed: how much to take   predniSONE 10 MG tablet Commonly known as: DELTASONE 3 tabs daily x 2 days,2 tabs daily x 2 days,1 tab daily x 4 days then stop. Start taking on: January 11, 2022   tiZANidine 2 MG tablet Commonly known as: ZANAFLEX Take 1 tablet (2 mg total) by mouth 3 (three) times daily.   Vitamin D (Ergocalciferol) 1.25 MG (50000 UNIT) Caps capsule Commonly known as: DRISDOL Take 1 capsule (50,000 Units total) by mouth every 7 (seven) days.               Durable Medical Equipment  (From admission, onward)           Start     Ordered   01/10/22  0943  For home use only DME oxygen  Once       Question Answer Comment  Length of Need Lifetime   Mode or (Route) Nasal cannula   Liters per Minute 2   Frequency Continuous (stationary and portable oxygen unit needed)   Oxygen delivery system Gas      01/10/22 0942   01/10/22 0000  For home use only DME Nebulizer machine       Question Answer Comment  Patient needs a nebulizer to treat with the following condition COPD (chronic obstructive pulmonary disease) (HCC)   Length of Need Lifetime      01/10/22 1050            Follow-up Information     [name], MD Follow up in 1 week(s).   Specialty: Internal Medicine Why: check bmp- rena function test Contact information: [address] [place] [address] [phone]         Georgeanna Lea, MD .   Specialty: Cardiology Contact information: [address] [place] [address] [phone]                Allergies  Allergen Reactions   Benztropine    Dairy Aid [Tilactase]    Lisinopril     Dc 06-2015, had lip swelling, stomatitis   Penicillins    Xanax [Alprazolam] Other (See Comments)    seizure   Monistat 3 Combo Pack App  [Miconazole Nitrate] Itching and Rash    The results of significant diagnostics from this hospitalization (including imaging, microbiology, ancillary and laboratory) are listed below for reference.    Microbiology: No results found for this or any previous visit (from the past 240 hour(s)).  Procedures/Studies: DG Chest Port 1 View  Result Date: 01/08/2022 CLINICAL DATA:  Dyspnea, congestive heart failure EXAM: PORTABLE CHEST 1 VIEW COMPARISON:  11/27/2020 FINDINGS: The lungs are symmetrically well expanded. Mild bilateral perihilar interstitial pulmonary infiltrate is present in keeping with changes of mild cardiogenic failure. No pneumothorax. Small left pleural effusion is not excluded. Mild cardiomegaly is stable. No acute bone abnormality IMPRESSION: Mild  cardiogenic failure. Electronically Signed   By: Helyn Numbers M.D.   On: 01/08/2022 01:42   CT HEAD WO CONTRAST ( )  Result Date: 12/25/2021 CLINICAL DATA:  Severe intractable headache. EXAM: CT HEAD WITHOUT CONTRAST TECHNIQUE: Contiguous axial images were obtained from the base of the skull through the vertex without intravenous contrast. RADIATION DOSE REDUCTION: This exam was performed according to the departmental dose-optimization program which includes automated exposure control, adjustment of the mA and/or kV according to patient size and/or use of iterative reconstruction technique. COMPARISON:  09/23/2017 FINDINGS: Brain: No evidence of intracranial hemorrhage, acute infarction, hydrocephalus, extra-axial collection, or mass lesion/mass effect. Mild diffuse cerebral atrophy and chronic small vessel disease show no significant change. Vascular:  No hyperdense vessel or other acute findings. Skull: No evidence of fracture or other significant bone abnormality. Sinuses/Orbits:  No acute findings. Other: None. IMPRESSION: No acute intracranial abnormality. Stable mild cerebral atrophy and chronic small vessel disease. Electronically Signed   By: Danae Orleans M.D.   On: 12/25/2021 13:13    Labs: BNP (last 3 results) Recent Labs    01/08/22 0129  BNP 239.6*   Basic Metabolic Panel: Recent Labs  Lab 01/08/22 0129 01/09/22 0545 01/10/22 0627  NA 136 139 138  K 3.5 4.6 3.9  CL 95* 97* 96*  CO2 30 32 33*  GLUCOSE 221* 290* 149*  BUN 26* 43* 62*  CREATININE 1.21* 1.33* 1.39*  CALCIUM 8.2* 8.6* 8.6*   Liver Function Tests: Recent Labs  Lab 01/09/22 0545  AST 19  ALT 18  ALKPHOS 67  BILITOT 0.2*  PROT 7.4  ALBUMIN 3.3*   No results for input(s): "LIPASE", "AMYLASE" in the last 168 hours. No results for input(s): "AMMONIA" in the last 168 hours. CBC: Recent Labs  Lab 01/08/22 0129 01/09/22 0545  WBC 9.2 9.9  NEUTROABS  5.6 8.4*  HGB 12.1 11.2*  HCT 37.0 35.6*  MCV 92.5  96.0  PLT 240 230   Cardiac Enzymes: No results for input(s): "CKTOTAL", "CKMB", "CKMBINDEX", "TROPONINI" in the last 168 hours. BNP: Invalid input(s): "POCBNP" CBG: Recent Labs  Lab 01/09/22 0732 01/09/22 1223 01/09/22 1735 01/09/22 2057 01/10/22 0714  GLUCAP 307* 299* 325* 383* 136*   D-Dimer No results for input(s): "DDIMER" in the last 72 hours. Hgb A1c Recent Labs    01/09/22 0545  HGBA1C 8.5*   Lipid Profile No results for input(s): "CHOL", "HDL", "LDLCALC", "TRIG", "CHOLHDL", "LDLDIRECT" in the last 72 hours. Thyroid function studies No results for input(s): "TSH", "T4TOTAL", "T3FREE", "THYROIDAB" in the last 72 hours.  Invalid input(s): "FREET3" Anemia work up No results for input(s): "VITAMINB12", "FOLATE", "FERRITIN", "TIBC", "IRON", "RETICCTPCT" in the last 72 hours. Urinalysis    Component Value Date/Time   COLORURINE YELLOW 04/29/2020 1550   APPEARANCEUR CLOUDY (A) 04/29/2020 1550   LABSPEC 1.025 04/29/2020 1550   PHURINE 5.5 04/29/2020 1550   GLUCOSEU 100 (A) 04/29/2020 1550   HGBUR NEGATIVE 04/29/2020 1550   BILIRUBINUR NEGATIVE 04/29/2020 1550   KETONESUR NEGATIVE 04/29/2020 1550   PROTEINUR 100 (A) 04/29/2020 1550   UROBILINOGEN 0.2 09/01/2013 1128   NITRITE NEGATIVE 04/29/2020 1550   LEUKOCYTESUR TRACE (A) 04/29/2020 1550   Sepsis Labs Recent Labs  Lab 01/08/22 0129 01/09/22 0545  WBC 9.2 9.9   Microbiology No results found for this or any previous visit (from the past 240 hour(s)).   Time coordinating discharge: 35 minutes  SIGNED: Lanae Boast, MD  [facility] 01/10/2022, 10:53 AM  If 7PM-7AM, please contact night-coverage [email or web address]

## 2022-01-12 ENCOUNTER — Telehealth: Payer: Self-pay

## 2022-01-12 ENCOUNTER — Telehealth: Payer: Self-pay | Admitting: Internal Medicine

## 2022-01-12 NOTE — Telephone Encounter (Signed)
Pt called stating that the ipratropium-albuterol that she was prescribed is not in stock at her [place] pharmacy. Advised that she check back with the pharmacy to see if any other [facility] are stocked on the [place] and can transfer the Rx to that pharmacy. Also advised if transfer was not possible to call us back with the pharmacy location and we will rewrite the Rx for that pharmacy.

## 2022-01-12 NOTE — Telephone Encounter (Signed)
Transition Care Management Follow-up Telephone Call Date of discharge and from where: TCM DC [facility] 01-10-22 Dx: Acute COPD exac How have you been since you were released from the hospital? Doing better  Any questions or concerns? No  Items Reviewed: Did the pt receive and understand the discharge instructions provided? Yes  Medications obtained and verified? Yes  Other? No  Any new allergies since your discharge? No  Dietary orders reviewed? Yes Do you have support at home? Yes   Home Care and Equipment/Supplies: Were home health services ordered? no If so, what is the name of the agency? na  Has the agency set up a time to come to the patient's home? not applicable Were any new equipment or medical supplies ordered?  Yes: oxygen/ nebulizer  What is the name of the medical supply agency? [employer] Were you able to get the supplies/equipment? yes Do you have any questions related to the use of the equipment or supplies? No  Functional Questionnaire: (I = Independent and D = Dependent) ADLs: I  Bathing/Dressing- I  Meal Prep- I  Eating- I  Maintaining continence- I  Transferring/Ambulation- I  Managing Meds- I  Follow up appointments reviewed:  PCP Hospital f/u appt confirmed? Yes  [name]? No . Are transportation arrangements needed? No  If their condition worsens, is the pt aware to call PCP or go to the Emergency Dept.? Yes Was the patient provided with contact information for the PCP's office or ED? Yes Was to pt encouraged to call back with questions or concerns? Yes

## 2022-01-19 ENCOUNTER — Telehealth: Payer: Self-pay | Admitting: Internal Medicine

## 2022-01-19 ENCOUNTER — Encounter: Payer: Self-pay | Admitting: Internal Medicine

## 2022-01-19 ENCOUNTER — Ambulatory Visit (INDEPENDENT_AMBULATORY_CARE_PROVIDER_SITE_OTHER): Payer: Medicare Other | Admitting: Internal Medicine

## 2022-01-19 VITALS — BP 126/68 | HR 62 | Temp 97.6°F | Ht 61.0 in | Wt 156.4 lb

## 2022-01-19 DIAGNOSIS — J9601 Acute respiratory failure with hypoxia: Secondary | ICD-10-CM

## 2022-01-19 DIAGNOSIS — J9620 Acute and chronic respiratory failure, unspecified whether with hypoxia or hypercapnia: Secondary | ICD-10-CM | POA: Diagnosis not present

## 2022-01-19 DIAGNOSIS — I5032 Chronic diastolic (congestive) heart failure: Secondary | ICD-10-CM

## 2022-01-19 DIAGNOSIS — J441 Chronic obstructive pulmonary disease with (acute) exacerbation: Secondary | ICD-10-CM

## 2022-01-19 LAB — BASIC METABOLIC PANEL
BUN: 34 mg/dL — ABNORMAL HIGH (ref 6–23)
CO2: 40 mEq/L — ABNORMAL HIGH (ref 19–32)
Calcium: 9.2 mg/dL (ref 8.4–10.5)
Chloride: 89 mEq/L — ABNORMAL LOW (ref 96–112)
Creatinine, Ser: 1.26 mg/dL — ABNORMAL HIGH (ref 0.40–1.20)
GFR: 45.97 mL/min — ABNORMAL LOW (ref >60.00)
Glucose, Bld: 251 mg/dL — ABNORMAL HIGH (ref 70–99)
Potassium: 4 mEq/L (ref 3.5–5.1)
Sodium: 135 mEq/L (ref 135–145)

## 2022-01-19 LAB — CBC WITH DIFFERENTIAL/PLATELET
Basophils Absolute: 0.2 10*3/uL — ABNORMAL HIGH (ref 0.0–0.1)
Basophils Relative: 1.9 % (ref 0.0–3.0)
Eosinophils Absolute: 0.1 10*3/uL (ref 0.0–0.7)
Eosinophils Relative: 1.1 % (ref 0.0–5.0)
HCT: 36.4 % (ref 36.0–46.0)
Hemoglobin: 12.1 g/dL (ref 12.0–15.0)
Lymphocytes Relative: 25.4 % (ref 12.0–46.0)
Lymphs Abs: 2.5 10*3/uL (ref 0.7–4.0)
MCHC: 33.3 g/dL (ref 30.0–36.0)
MCV: 91.9 fl (ref 78.0–100.0)
Monocytes Absolute: 0.8 10*3/uL (ref 0.1–1.0)
Monocytes Relative: 8 % (ref 3.0–12.0)
Neutro Abs: 6.2 10*3/uL (ref 1.4–7.7)
Neutrophils Relative %: 63.6 % (ref 43.0–77.0)
Platelets: 167 10*3/uL (ref 150.0–400.0)
RBC: 3.96 Mil/uL (ref 3.87–5.11)
RDW: 14.8 % (ref 11.5–15.5)
WBC: 9.8 10*3/uL (ref 4.0–10.5)

## 2022-01-19 LAB — BRAIN NATRIURETIC PEPTIDE: Pro B Natriuretic peptide (BNP): 496 pg/mL — ABNORMAL HIGH (ref 0.0–100.0)

## 2022-01-19 MED ORDER — FUROSEMIDE 40 MG PO TABS: 40.0000 mg | ORAL_TABLET | Freq: Two times a day (BID) | ORAL | Status: DC

## 2022-01-19 NOTE — Telephone Encounter (Signed)
Okay to hold it.  Use it if cough or wheeze

## 2022-01-19 NOTE — Telephone Encounter (Signed)
Spoke w/ Pt- informed of PCP recommendations.  

## 2022-01-20 ENCOUNTER — Telehealth: Payer: Self-pay

## 2022-01-20 NOTE — Assessment & Plan Note (Signed)
TCM 14 Acute respiratory failure with hypoxia: Admitted to the hospital with hypoxia, mild cough, some wheezing. Chest x-ray showed moderate cardiomegaly with heart failure signs BNP was elevated. Working diagnosis was COPD exacerbation.  + History of secondhand smoking. Was treated with steroids, nebulizers. Overall she feels better. Creatinine was slightly elevated, was recommended to hold metformin and decrease Lasix from 40 mg B.I.D. to 40 mg daily. + Weight gain noted (in our office 151  >> 156 Lb; at home 9 pounds since she left the hospital) O2 sat upon arrival was low, I rechecked is 95%. While COPD exacerbation is possible, I think diastolic CHF is playing a larger role  on her symptoms. She did have a car trip prior to admission but denies calf pain, palpitations or actual chest pain. Plan BMP, BNP, CBC. Cardiology referral Go back on Lasix twice daily Follow-up in 3 weeks Daily weights Diastolic CHF: See above DM: Holding metformin due to slightly increased creatinine.  Rechecking a BMP today. Headache: See visit from 12/25/2021, c/o headache, CT negative, symptoms resolved RTC 3 weeks

## 2022-01-23 ENCOUNTER — Other Ambulatory Visit: Payer: Self-pay

## 2022-01-23 ENCOUNTER — Inpatient Hospital Stay (HOSPITAL_COMMUNITY)
Admission: EM | Admit: 2022-01-23 | Discharge: 2022-01-29 | DRG: 291 | Disposition: A | Discharge status: Home / Self Care | Payer: Medicare Other | Attending: Internal Medicine | Admitting: Internal Medicine

## 2022-01-23 ENCOUNTER — Encounter (HOSPITAL_COMMUNITY): Payer: Self-pay | Admitting: Internal Medicine

## 2022-01-23 ENCOUNTER — Emergency Department (HOSPITAL_COMMUNITY): Payer: Medicare Other

## 2022-01-23 ENCOUNTER — Inpatient Hospital Stay (HOSPITAL_COMMUNITY): Payer: Medicare Other

## 2022-01-23 DIAGNOSIS — Z880 Allergy status to penicillin: Secondary | ICD-10-CM | POA: Diagnosis not present

## 2022-01-23 DIAGNOSIS — I130 Hypertensive heart and chronic kidney disease with heart failure and stage 1 through stage 4 chronic kidney disease, or unspecified chronic kidney disease: Secondary | ICD-10-CM | POA: Diagnosis not present

## 2022-01-23 DIAGNOSIS — K2100 Gastro-esophageal reflux disease with esophagitis, without bleeding: Secondary | ICD-10-CM | POA: Diagnosis not present

## 2022-01-23 DIAGNOSIS — J9622 Acute and chronic respiratory failure with hypercapnia: Secondary | ICD-10-CM | POA: Diagnosis not present

## 2022-01-23 DIAGNOSIS — T380X5A Adverse effect of glucocorticoids and synthetic analogues, initial encounter: Secondary | ICD-10-CM | POA: Diagnosis present

## 2022-01-23 DIAGNOSIS — J9602 Acute respiratory failure with hypercapnia: Secondary | ICD-10-CM | POA: Diagnosis not present

## 2022-01-23 DIAGNOSIS — T502X5A Adverse effect of carbonic-anhydrase inhibitors, benzothiadiazides and other diuretics, initial encounter: Secondary | ICD-10-CM | POA: Diagnosis not present

## 2022-01-23 DIAGNOSIS — Z794 Long term (current) use of insulin: Secondary | ICD-10-CM

## 2022-01-23 DIAGNOSIS — I509 Heart failure, unspecified: Secondary | ICD-10-CM | POA: Diagnosis not present

## 2022-01-23 DIAGNOSIS — I5033 Acute on chronic diastolic (congestive) heart failure: Secondary | ICD-10-CM | POA: Diagnosis present

## 2022-01-23 DIAGNOSIS — E876 Hypokalemia: Secondary | ICD-10-CM | POA: Diagnosis not present

## 2022-01-23 DIAGNOSIS — Z8249 Family history of ischemic heart disease and other diseases of the circulatory system: Secondary | ICD-10-CM

## 2022-01-23 DIAGNOSIS — N1832 Chronic kidney disease, stage 3b: Secondary | ICD-10-CM | POA: Diagnosis not present

## 2022-01-23 DIAGNOSIS — N281 Cyst of kidney, acquired: Secondary | ICD-10-CM | POA: Diagnosis not present

## 2022-01-23 DIAGNOSIS — E1165 Type 2 diabetes mellitus with hyperglycemia: Secondary | ICD-10-CM | POA: Diagnosis present

## 2022-01-23 DIAGNOSIS — I480 Paroxysmal atrial fibrillation: Secondary | ICD-10-CM | POA: Diagnosis present

## 2022-01-23 DIAGNOSIS — J449 Chronic obstructive pulmonary disease, unspecified: Secondary | ICD-10-CM | POA: Diagnosis present

## 2022-01-23 DIAGNOSIS — Z20822 Contact with and (suspected) exposure to covid-19: Secondary | ICD-10-CM | POA: Diagnosis not present

## 2022-01-23 DIAGNOSIS — E1122 Type 2 diabetes mellitus with diabetic chronic kidney disease: Secondary | ICD-10-CM | POA: Diagnosis present

## 2022-01-23 DIAGNOSIS — N183 Chronic kidney disease, stage 3 unspecified: Secondary | ICD-10-CM | POA: Diagnosis present

## 2022-01-23 DIAGNOSIS — D649 Anemia, unspecified: Secondary | ICD-10-CM | POA: Diagnosis present

## 2022-01-23 DIAGNOSIS — J9621 Acute and chronic respiratory failure with hypoxia: Secondary | ICD-10-CM | POA: Diagnosis present

## 2022-01-23 DIAGNOSIS — E11319 Type 2 diabetes mellitus with unspecified diabetic retinopathy without macular edema: Secondary | ICD-10-CM | POA: Diagnosis present

## 2022-01-23 DIAGNOSIS — I700 Atherosclerosis of aorta: Secondary | ICD-10-CM | POA: Diagnosis not present

## 2022-01-23 DIAGNOSIS — Z888 Allergy status to other drugs, medicaments and biological substances status: Secondary | ICD-10-CM

## 2022-01-23 DIAGNOSIS — F4310 Post-traumatic stress disorder, unspecified: Secondary | ICD-10-CM | POA: Diagnosis present

## 2022-01-23 DIAGNOSIS — K219 Gastro-esophageal reflux disease without esophagitis: Secondary | ICD-10-CM | POA: Diagnosis present

## 2022-01-23 DIAGNOSIS — E8809 Other disorders of plasma-protein metabolism, not elsewhere classified: Secondary | ICD-10-CM | POA: Diagnosis present

## 2022-01-23 DIAGNOSIS — Z6827 Body mass index (BMI) 27.0-27.9, adult: Secondary | ICD-10-CM

## 2022-01-23 DIAGNOSIS — E663 Overweight: Secondary | ICD-10-CM | POA: Diagnosis present

## 2022-01-23 DIAGNOSIS — N1831 Chronic kidney disease, stage 3a: Secondary | ICD-10-CM | POA: Diagnosis not present

## 2022-01-23 DIAGNOSIS — E785 Hyperlipidemia, unspecified: Secondary | ICD-10-CM | POA: Diagnosis not present

## 2022-01-23 DIAGNOSIS — F319 Bipolar disorder, unspecified: Secondary | ICD-10-CM | POA: Diagnosis present

## 2022-01-23 DIAGNOSIS — N179 Acute kidney failure, unspecified: Secondary | ICD-10-CM | POA: Diagnosis not present

## 2022-01-23 DIAGNOSIS — Z79899 Other long term (current) drug therapy: Secondary | ICD-10-CM

## 2022-01-23 DIAGNOSIS — Z833 Family history of diabetes mellitus: Secondary | ICD-10-CM

## 2022-01-23 DIAGNOSIS — J9610 Chronic respiratory failure, unspecified whether with hypoxia or hypercapnia: Secondary | ICD-10-CM | POA: Diagnosis present

## 2022-01-23 DIAGNOSIS — J90 Pleural effusion, not elsewhere classified: Secondary | ICD-10-CM | POA: Diagnosis not present

## 2022-01-23 DIAGNOSIS — E119 Type 2 diabetes mellitus without complications: Secondary | ICD-10-CM

## 2022-01-23 DIAGNOSIS — M7989 Other specified soft tissue disorders: Secondary | ICD-10-CM | POA: Diagnosis not present

## 2022-01-23 DIAGNOSIS — R0602 Shortness of breath: Secondary | ICD-10-CM | POA: Diagnosis not present

## 2022-01-23 DIAGNOSIS — E7800 Pure hypercholesterolemia, unspecified: Secondary | ICD-10-CM | POA: Diagnosis present

## 2022-01-23 DIAGNOSIS — R52 Pain, unspecified: Secondary | ICD-10-CM | POA: Diagnosis not present

## 2022-01-23 DIAGNOSIS — I517 Cardiomegaly: Secondary | ICD-10-CM | POA: Diagnosis not present

## 2022-01-23 DIAGNOSIS — R9431 Abnormal electrocardiogram [ECG] [EKG]: Secondary | ICD-10-CM | POA: Diagnosis not present

## 2022-01-23 DIAGNOSIS — Z7982 Long term (current) use of aspirin: Secondary | ICD-10-CM

## 2022-01-23 DIAGNOSIS — K589 Irritable bowel syndrome without diarrhea: Secondary | ICD-10-CM | POA: Diagnosis present

## 2022-01-23 LAB — BASIC METABOLIC PANEL
Anion gap: 11 (ref 5–15)
BUN: 25 mg/dL — ABNORMAL HIGH (ref 8–23)
CO2: 31 mmol/L (ref 22–32)
Calcium: 8.9 mg/dL (ref 8.9–10.3)
Chloride: 91 mmol/L — ABNORMAL LOW (ref 98–111)
Creatinine, Ser: 1.08 mg/dL — ABNORMAL HIGH (ref 0.44–1.00)
GFR, Estimated: 58 mL/min — ABNORMAL LOW (ref >60)
Glucose, Bld: 539 mg/dL (ref 70–99)
Potassium: 4.6 mmol/L (ref 3.5–5.1)
Sodium: 133 mmol/L — ABNORMAL LOW (ref 135–145)

## 2022-01-23 LAB — CBC WITH DIFFERENTIAL/PLATELET
Abs Immature Granulocytes: 0.06 10*3/uL (ref 0.00–0.07)
Abs Immature Granulocytes: 0.1 10*3/uL — ABNORMAL HIGH (ref 0.00–0.07)
Basophils Absolute: 0.1 10*3/uL (ref 0.0–0.1)
Basophils Absolute: 0.1 10*3/uL (ref 0.0–0.1)
Basophils Relative: 0 %
Basophils Relative: 0 %
Eosinophils Absolute: 0.2 10*3/uL (ref 0.0–0.5)
Eosinophils Absolute: 0.1 10*3/uL (ref 0.0–0.5)
Eosinophils Relative: 1 %
Eosinophils Relative: 1 %
HCT: 39.9 % (ref 36.0–46.0)
HCT: 37.7 % (ref 36.0–46.0)
Hemoglobin: 12.4 g/dL (ref 12.0–15.0)
Hemoglobin: 11.9 g/dL — ABNORMAL LOW (ref 12.0–15.0)
Immature Granulocytes: 1 %
Immature Granulocytes: 1 %
Lymphocytes Relative: 12 %
Lymphocytes Relative: 15 %
Lymphs Abs: 1.6 10*3/uL (ref 0.7–4.0)
Lymphs Abs: 1.7 10*3/uL (ref 0.7–4.0)
MCH: 30 pg (ref 26.0–34.0)
MCH: 30.1 pg (ref 26.0–34.0)
MCHC: 31.1 g/dL (ref 30.0–36.0)
MCHC: 31.6 g/dL (ref 30.0–36.0)
MCV: 96.4 fL (ref 80.0–100.0)
MCV: 95.2 fL (ref 80.0–100.0)
Monocytes Absolute: 1 10*3/uL (ref 0.1–1.0)
Monocytes Absolute: 0.8 10*3/uL (ref 0.1–1.0)
Monocytes Relative: 7 %
Monocytes Relative: 7 %
Neutro Abs: 10.3 10*3/uL — ABNORMAL HIGH (ref 1.7–7.7)
Neutro Abs: 8.5 10*3/uL — ABNORMAL HIGH (ref 1.7–7.7)
Neutrophils Relative %: 79 %
Neutrophils Relative %: 76 %
Platelets: 180 10*3/uL (ref 150–400)
Platelets: 186 10*3/uL (ref 150–400)
RBC: 4.14 MIL/uL (ref 3.87–5.11)
RBC: 3.96 MIL/uL (ref 3.87–5.11)
RDW: 14 % (ref 11.5–15.5)
RDW: 14 % (ref 11.5–15.5)
WBC: 13.1 10*3/uL — ABNORMAL HIGH (ref 4.0–10.5)
WBC: 11.2 10*3/uL — ABNORMAL HIGH (ref 4.0–10.5)
nRBC: 0 % (ref 0.0–0.2)
nRBC: 0 % (ref 0.0–0.2)

## 2022-01-23 LAB — ECHOCARDIOGRAM COMPLETE
AR max vel: 1.91 cm2
AV Area VTI: 1.92 cm2
AV Area mean vel: 1.88 cm2
AV Mean grad: 8 mmHg
AV Peak grad: 14.5 mmHg
Ao pk vel: 1.91 m/s
Area-P 1/2: 4.71 cm2
Calc EF: 72.3 %
P 1/2 time: 589 msec
S' Lateral: 2.9 cm
Single Plane A2C EF: 77.9 %
Single Plane A4C EF: 68.4 %

## 2022-01-23 LAB — BLOOD GAS, VENOUS
Acid-Base Excess: 7.9 mmol/L — ABNORMAL HIGH (ref 0.0–2.0)
Acid-Base Excess: 12.4 mmol/L — ABNORMAL HIGH (ref 0.0–2.0)
Bicarbonate: 37.7 mmol/L — ABNORMAL HIGH (ref 20.0–28.0)
Bicarbonate: 40.5 mmol/L — ABNORMAL HIGH (ref 20.0–28.0)
O2 Saturation: 79 %
O2 Saturation: 33.8 %
Patient temperature: 37
Patient temperature: 37
pCO2, Ven: 82 mmHg (ref 44–60)
pCO2, Ven: 70 mmHg — ABNORMAL HIGH (ref 44–60)
pH, Ven: 7.27 (ref 7.25–7.43)
pH, Ven: 7.37 (ref 7.25–7.43)
pO2, Ven: 50 mmHg — ABNORMAL HIGH (ref 32–45)
pO2, Ven: <31 mmHg — CL (ref 32–45)

## 2022-01-23 LAB — CBG MONITORING, ED
Glucose-Capillary: 336 mg/dL — ABNORMAL HIGH (ref 70–99)
Glucose-Capillary: 329 mg/dL — ABNORMAL HIGH (ref 70–99)
Glucose-Capillary: 156 mg/dL — ABNORMAL HIGH (ref 70–99)
Glucose-Capillary: 206 mg/dL — ABNORMAL HIGH (ref 70–99)
Glucose-Capillary: 202 mg/dL — ABNORMAL HIGH (ref 70–99)
Glucose-Capillary: 144 mg/dL — ABNORMAL HIGH (ref 70–99)

## 2022-01-23 LAB — MAGNESIUM: Magnesium: 2 mg/dL (ref 1.7–2.4)

## 2022-01-23 LAB — CREATININE, SERUM
Creatinine, Ser: 1.15 mg/dL — ABNORMAL HIGH (ref 0.44–1.00)
GFR, Estimated: 54 mL/min — ABNORMAL LOW (ref >60)

## 2022-01-23 LAB — TSH: TSH: 1.008 u[IU]/mL (ref 0.350–4.500)

## 2022-01-23 LAB — BRAIN NATRIURETIC PEPTIDE: B Natriuretic Peptide: 461 pg/mL — ABNORMAL HIGH (ref 0.0–100.0)

## 2022-01-23 MED ORDER — MELATONIN 5 MG PO TABS
10.0000 mg | ORAL_TABLET | Freq: Every day | ORAL | Status: DC
  Administered 2022-01-23 – 2022-01-28 (×6): 10 mg via ORAL
  Filled 2022-01-23 (×6): qty 2

## 2022-01-23 MED ORDER — ALBUTEROL SULFATE (2.5 MG/3ML) 0.083% IN NEBU
INHALATION_SOLUTION | RESPIRATORY_TRACT | Status: AC
  Filled 2022-01-23: qty 3

## 2022-01-23 MED ORDER — DIVALPROEX SODIUM 250 MG PO DR TAB
250.0000 mg | DELAYED_RELEASE_TABLET | Freq: Every morning | ORAL | Status: DC
  Administered 2022-01-24 – 2022-01-29 (×6): 250 mg via ORAL
  Filled 2022-01-23 (×6): qty 1

## 2022-01-23 MED ORDER — ACETAMINOPHEN 325 MG PO TABS
650.0000 mg | ORAL_TABLET | ORAL | Status: DC | PRN
  Administered 2022-01-27 – 2022-01-29 (×7): 650 mg via ORAL
  Filled 2022-01-23 (×7): qty 2

## 2022-01-23 MED ORDER — INSULIN ASPART 100 UNIT/ML IJ SOLN
0.0000 [IU] | Freq: Every day | INTRAMUSCULAR | Status: DC
  Administered 2022-01-24: 4 [IU] via SUBCUTANEOUS
  Administered 2022-01-25 – 2022-01-26 (×2): 3 [IU] via SUBCUTANEOUS
  Administered 2022-01-27 – 2022-01-28 (×2): 4 [IU] via SUBCUTANEOUS
  Filled 2022-01-23: qty 0.05

## 2022-01-23 MED ORDER — DIVALPROEX SODIUM 250 MG PO DR TAB
500.0000 mg | DELAYED_RELEASE_TABLET | Freq: Every day | ORAL | Status: DC
  Administered 2022-01-23 – 2022-01-28 (×6): 500 mg via ORAL
  Filled 2022-01-23 (×3): qty 2
  Filled 2022-01-23: qty 1
  Filled 2022-01-23 (×2): qty 2

## 2022-01-23 MED ORDER — PERFLUTREN LIPID MICROSPHERE
1.0000 mL | INTRAVENOUS | Status: AC | PRN
  Administered 2022-01-23: 3 mL via INTRAVENOUS

## 2022-01-23 MED ORDER — FLUTICASONE FUROATE-VILANTEROL 100-25 MCG/ACT IN AEPB
1.0000 | INHALATION_SPRAY | Freq: Every day | RESPIRATORY_TRACT | Status: DC
  Administered 2022-01-24 – 2022-01-29 (×6): 1 via RESPIRATORY_TRACT
  Filled 2022-01-23: qty 28

## 2022-01-23 MED ORDER — FUROSEMIDE 10 MG/ML IJ SOLN
40.0000 mg | Freq: Once | INTRAMUSCULAR | Status: AC
  Administered 2022-01-23: 40 mg via INTRAVENOUS
  Filled 2022-01-23: qty 4

## 2022-01-23 MED ORDER — IPRATROPIUM-ALBUTEROL 0.5-2.5 (3) MG/3ML IN SOLN: 3.0000 mL | Freq: Three times a day (TID) | RESPIRATORY_TRACT | Status: DC | PRN

## 2022-01-23 MED ORDER — CLONAZEPAM 1 MG PO TABS
1.0000 mg | ORAL_TABLET | Freq: Two times a day (BID) | ORAL | Status: DC
  Administered 2022-01-23 – 2022-01-29 (×12): 1 mg via ORAL
  Filled 2022-01-23 (×3): qty 1
  Filled 2022-01-23: qty 2
  Filled 2022-01-23 (×6): qty 1
  Filled 2022-01-23: qty 2
  Filled 2022-01-23: qty 1

## 2022-01-23 MED ORDER — DIVALPROEX SODIUM 250 MG PO DR TAB: 250.0000 mg | DELAYED_RELEASE_TABLET | Freq: Two times a day (BID) | ORAL | Status: DC

## 2022-01-23 MED ORDER — NITROGLYCERIN 2 % TD OINT
1.0000 [in_us] | TOPICAL_OINTMENT | Freq: Once | TRANSDERMAL | Status: AC
  Administered 2022-01-23: 1 [in_us] via TOPICAL
  Filled 2022-01-23: qty 1

## 2022-01-23 MED ORDER — METOPROLOL TARTRATE 50 MG PO TABS
50.0000 mg | ORAL_TABLET | Freq: Two times a day (BID) | ORAL | Status: DC
  Administered 2022-01-23 – 2022-01-29 (×12): 50 mg via ORAL
  Filled 2022-01-23 (×2): qty 1
  Filled 2022-01-23: qty 2
  Filled 2022-01-23 (×5): qty 1
  Filled 2022-01-23: qty 2
  Filled 2022-01-23 (×3): qty 1

## 2022-01-23 MED ORDER — ONDANSETRON HCL 4 MG/2ML IJ SOLN: 4.0000 mg | Freq: Four times a day (QID) | INTRAMUSCULAR | Status: DC | PRN

## 2022-01-23 MED ORDER — IPRATROPIUM BROMIDE 0.02 % IN SOLN
RESPIRATORY_TRACT | Status: AC
  Filled 2022-01-23: qty 2.5

## 2022-01-23 MED ORDER — SODIUM CHLORIDE 0.9 % IV SOLN: 250.0000 mL | INTRAVENOUS | Status: DC | PRN

## 2022-01-23 MED ORDER — FUROSEMIDE 10 MG/ML IJ SOLN
40.0000 mg | Freq: Two times a day (BID) | INTRAMUSCULAR | Status: DC
  Administered 2022-01-23 – 2022-01-24 (×4): 40 mg via INTRAVENOUS
  Filled 2022-01-23 (×4): qty 4

## 2022-01-23 MED ORDER — AMLODIPINE BESYLATE 10 MG PO TABS
10.0000 mg | ORAL_TABLET | Freq: Every day | ORAL | Status: DC
  Administered 2022-01-23 – 2022-01-29 (×7): 10 mg via ORAL
  Filled 2022-01-23 (×4): qty 1
  Filled 2022-01-23: qty 2
  Filled 2022-01-23: qty 1
  Filled 2022-01-23: qty 2

## 2022-01-23 MED ORDER — ALBUTEROL SULFATE HFA 108 (90 BASE) MCG/ACT IN AERS
INHALATION_SPRAY | RESPIRATORY_TRACT | Status: AC
  Filled 2022-01-23: qty 6.7

## 2022-01-23 MED ORDER — UMECLIDINIUM BROMIDE 62.5 MCG/ACT IN AEPB
1.0000 | INHALATION_SPRAY | Freq: Every day | RESPIRATORY_TRACT | Status: DC
Start: 2022-01-24 — End: 2022-01-29
  Administered 2022-01-24 – 2022-01-29 (×6): 1 via RESPIRATORY_TRACT
  Filled 2022-01-23: qty 7

## 2022-01-23 MED ORDER — ALBUTEROL SULFATE HFA 108 (90 BASE) MCG/ACT IN AERS: 2.0000 | INHALATION_SPRAY | Freq: Four times a day (QID) | RESPIRATORY_TRACT | Status: DC | PRN

## 2022-01-23 MED ORDER — SODIUM CHLORIDE 0.9% FLUSH
3.0000 mL | Freq: Two times a day (BID) | INTRAVENOUS | Status: DC
  Administered 2022-01-23 – 2022-01-29 (×12): 3 mL via INTRAVENOUS

## 2022-01-23 MED ORDER — FUROSEMIDE 10 MG/ML IJ SOLN
20.0000 mg | INTRAMUSCULAR | Status: AC
  Administered 2022-01-23: 20 mg via INTRAVENOUS
  Filled 2022-01-23: qty 4

## 2022-01-23 MED ORDER — ALBUTEROL SULFATE (2.5 MG/3ML) 0.083% IN NEBU
2.5000 mg | INHALATION_SOLUTION | Freq: Four times a day (QID) | RESPIRATORY_TRACT | Status: DC | PRN
Start: 2022-01-23 — End: 2022-01-29

## 2022-01-23 MED ORDER — ASPIRIN 81 MG PO TBEC
81.0000 mg | DELAYED_RELEASE_TABLET | Freq: Every day | ORAL | Status: DC
  Administered 2022-01-23 – 2022-01-28 (×6): 81 mg via ORAL
  Filled 2022-01-23 (×6): qty 1

## 2022-01-23 MED ORDER — ENOXAPARIN SODIUM 40 MG/0.4ML IJ SOSY
40.0000 mg | PREFILLED_SYRINGE | INTRAMUSCULAR | Status: DC
  Administered 2022-01-23 – 2022-01-28 (×6): 40 mg via SUBCUTANEOUS
  Filled 2022-01-23 (×6): qty 0.4

## 2022-01-23 MED ORDER — ATORVASTATIN CALCIUM 40 MG PO TABS
40.0000 mg | ORAL_TABLET | Freq: Every day | ORAL | Status: DC
  Administered 2022-01-23 – 2022-01-28 (×6): 40 mg via ORAL
  Filled 2022-01-23 (×6): qty 1

## 2022-01-23 MED ORDER — INSULIN ASPART 100 UNIT/ML IJ SOLN
0.0000 [IU] | Freq: Three times a day (TID) | INTRAMUSCULAR | Status: DC
  Administered 2022-01-23: 5 [IU] via SUBCUTANEOUS
  Administered 2022-01-24: 3 [IU] via SUBCUTANEOUS
  Administered 2022-01-24: 8 [IU] via SUBCUTANEOUS
  Administered 2022-01-25: 3 [IU] via SUBCUTANEOUS
  Administered 2022-01-25: 11 [IU] via SUBCUTANEOUS
  Administered 2022-01-25: 8 [IU] via SUBCUTANEOUS
  Administered 2022-01-26: 5 [IU] via SUBCUTANEOUS
  Administered 2022-01-26: 3 [IU] via SUBCUTANEOUS
  Administered 2022-01-26: 5 [IU] via SUBCUTANEOUS
  Administered 2022-01-27: 3 [IU] via SUBCUTANEOUS
  Administered 2022-01-27: 11 [IU] via SUBCUTANEOUS
  Administered 2022-01-27: 5 [IU] via SUBCUTANEOUS
  Administered 2022-01-28 (×2): 3 [IU] via SUBCUTANEOUS
  Administered 2022-01-28: 5 [IU] via SUBCUTANEOUS
  Administered 2022-01-29 (×2): 8 [IU] via SUBCUTANEOUS
  Filled 2022-01-23: qty 0.15

## 2022-01-23 MED ORDER — SODIUM CHLORIDE 0.9% FLUSH: 3.0000 mL | INTRAVENOUS | Status: DC | PRN

## 2022-01-23 MED ORDER — INSULIN ASPART 100 UNIT/ML IJ SOLN
0.0000 [IU] | INTRAMUSCULAR | Status: DC
  Administered 2022-01-23: 5 [IU] via SUBCUTANEOUS
  Administered 2022-01-23: 3 [IU] via SUBCUTANEOUS
  Administered 2022-01-23: 11 [IU] via SUBCUTANEOUS
  Administered 2022-01-23: 5 [IU] via SUBCUTANEOUS
  Filled 2022-01-23: qty 0.15

## 2022-01-23 NOTE — Progress Notes (Signed)
RT took pt off BIPAP and placed on 2 LPM [name]. No complications at this time.

## 2022-01-23 NOTE — H&P (Signed)
History and Physical    Adrienne Shuckatricia A Catherman WUJ:[id number] DOB: Jun 12, 1961 DOA: 01/23/2022  PCP: Wanda PlumpPaz, Jose E, MD  Patient coming from: Home  I have personally briefly reviewed patient's [facility]  Chief Complaint: Difficulty in breathing  HPI: Adrienne Bennett is a 61 y.o. female with medical history significant of pretension, hyperlipidemia, type 2 diabetes, diastolic congestive heart failure, COPD, PTSD/bipolar disorder/depression present here for the evaluation of difficulty in breathing.  Patient reports that her symptoms started 2 weeks ago.  Her PCP recommended to use nebulizer at home which made her symptoms more worse.  She has dry cough, reports that she feels like fluid in her chest and associated with leg swelling.  Weight gain noted on recent PCP office visit.  Last night her symptoms started getting worse therefore her husband brought her to the ED for further evaluation and management.    She denies chest pain, palpitations, wheezing, fever, chills, sputum production, recent sick contact, nausea, vomiting, diarrhea, any UTI symptoms, headache, blurry vision or poor appetite.    No history of tobacco abuse, alcohol abuse, illicit drug use.  She has been compliant with her home medications.  Lives with her husband at home.  Independent on daily life activities.  Off note: Patient recently admitted acute on chronic hypoxemic respiratory failure due to COPD exacerbation and discharged on 6/17.    ED Course: Upon arrival to ED: Patient, afebrile, pulse 73, respiratory rate in 20s, blood pressure 196/54, requiring BiPAP.  Nitro paste applied and her blood pressure improved.  BNP: 461, chest x-ray shows bilateral pleural effusion, ABGs concerning for hypoxia and hypercapnia.  She was given IV Lasix.  [facility] hospitalist consulted for admission for acute on chronic hypoxia and hypercapnia in the setting of congestive heart failure.  Review of Systems: As per HPI  otherwise negative.    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    Essential hypertension 09/04/2010   Qualifier: Diagnosis of  By: Drue NovelPaz MD, Nolon RodJose E.    GANGLION CYST 07/22/2010   Qualifier: Diagnosis of  By: Drue NovelPaz MD, Jose E.    GERD (gastroesophageal reflux disease) 05/23/2017   High cholesterol    Hyperlipidemia 10/12/2012   Hypertension    IBS (irritable bowel syndrome)    chronic Diarrhea   IBS ? 07/16/2008   Qualifier: Diagnosis of  By: Drue NovelPaz MD, Nolon RodJose E.    Macular degeneration, dry    Migraine    "long long time ago; maybe 20 yr ago" (09/01/2013)   MIGRAINE HEADACHE 03/13/2010   Qualifier: Diagnosis of  By: Drue NovelPaz MD, Jose E.    Mouth sores 04/15/2017   Neck pain 02/03/2011   Osteoarthritis    "back; goes down my right leg" (09/01/2013)   Subclinical hyperthyroidism 02/26/2018   Thyromegaly 12/04/2014   Type II diabetes mellitus (HCC) 2007    Past Surgical History:  Procedure Laterality Date   CARPAL TUNNEL RELEASE Bilateral ~ 2000   cataract surgery Bilateral    TONSILLECTOMY  07/27/1968   TUBAL LIGATION  07/27/1994     reports that she has never smoked. She has never used smokeless tobacco. She reports that she does not drink alcohol and does not use drugs.  Allergies  Allergen Reactions   Benztropine    D[employer] Aid [Tilactase]    Lisinopril     Dc 06-2015, had lip  swelling, stomatitis   Penicillins    Xanax [Alprazolam] Other (See Comments)    seizure   Monistat 3 Combo Pack App  [Miconazole Nitrate] Itching and Rash    Family History  Problem Relation Age of Onset   Diabetes Mother    Diabetes Sister    CAD Sister    Hypertension Sister    Diabetes Maternal Grandmother    Coronary artery disease Father        age? 50s?   Coronary artery disease Brother        age?, 55s?   Hypertension Brother    Colon cancer Neg Hx    Breast cancer Neg Hx     Prior to Admission medications   Medication Sig Start Date End Date Taking? Authorizing Provider  albuterol (VENTOLIN HFA) 108 (90 Base) MCG/ACT inhaler Inhale 2 puffs into the lungs every 6 (six) hours as needed for wheezing or shortness of breath. 02/26/21   [name], MD  amLODipine (NORVASC) 10 MG tablet TAKE 1 TABLET BY MOUTH  DAILY Patient taking differently: Take 10 mg by mouth daily. 09/24/21   [name], MD  aspirin EC 81 MG tablet Take 81 mg by mouth daily. Swallow whole.    [provider]  atorvastatin (LIPITOR) 40 MG tablet Take 1 tablet (40 mg total) by mouth at bedtime. Patient taking differently: Take 40 mg by mouth daily. 10/27/21   [name], MD  clonazePAM (KLONOPIN) 1 MG tablet Take 1 mg by mouth 2 (two) times daily. 1 tab AM, 1 tab PM, rx by psychiatry    [provider]  cyclobenzaprine (FLEXERIL) 10 MG tablet TAKE 1 TABLET BY MOUTH  TWICE DAILY AS NEEDED FOR  MUSCLE SPASM(S) Patient taking differently: Take 10 mg by mouth 2 (two) times daily as needed for muscle spasms. 05/19/21   [name], MD  divalproex (DEPAKOTE) 250 MG DR tablet Take 250-500 mg by mouth 2 (two) times daily. 1 tab AM, 2 tabs PM    [provider]  fluticasone furoate-vilanterol (BREO ELLIPTA) 100-25 MCG/ACT AEPB Inhale 1 puff into the lungs daily. 01/10/22 02/09/22  Lanae Boast, MD  furosemide (LASIX) 40 MG tablet Take 1 tablet (40 mg total) by mouth 2 (two) times daily. 01/19/22    [name], MD  insulin degludec (TRESIBA) 200 UNIT/ML FlexTouch Pen Inject 50 Units into the skin at bedtime. 05/02/20   [name], MD  ipratropium-albuterol (DUONEB) 0.5-2.5 (3) MG/3ML SOLN Take 3 mLs by nebulization 3 (three) times daily as needed. 01/10/22 02/09/22  Lanae Boast, MD  ketoconazole (NIZORAL) 2 % cream Apply 1 application topically 2 (two) times daily. For 10 days Patient taking differently: Apply 1 application  topically daily as needed for irritation. 03/27/21   [name], MD  Melatonin 10 MG TABS Take 10 mg by mouth at bedtime.    [provider]  metoprolol tartrate (LOPRESSOR) 50 MG tablet TAKE 1 TABLET BY MOUTH TWICE  DAILY Patient  taking differently: Take 75 mg by mouth 2 (two) times daily. 11/18/21   Colon Branch, MD  OXYGEN Inhale 2 L/min into the lungs continuous.    [provider]  tiZANidine (ZANAFLEX) 2 MG tablet Take 1 tablet (2 mg total) by mouth 3 (three) times daily. Patient not taking: Reported on 01/12/2022 12/26/21   Colon Branch, MD  umeclidinium bromide (INCRUSE ELLIPTA) 62.5 MCG/ACT AEPB Inhale 1 puff into the lungs daily. 01/10/22 02/09/22  Antonieta Pert, MD  Vitamin D, Ergocalciferol, (DRISDOL) 1.25 MG (50000 UNIT) CAPS capsule Take 1 capsule (50,000 Units total) by mouth every 7 (seven) days. 10/30/21   Colon Branch, MD    Physical Exam: Vitals:   01/23/22 0500 01/23/22 0545 01/23/22 0600 01/23/22 0615  BP: (!) 133/51 (!) 176/92 (!) 145/82 136/68  Pulse: (!) 140 76 73 71  Resp: (!) 27 (!) 25 (!) 25 (!) 22  Temp:      TempSrc:      SpO2: 95% 95% 96% 95%    Constitutional: In some respiratory distress.  On BiPAP.  Appears older than stated age Eyes: PERRL, lids and conjunctivae normal Neck: normal, supple, no masses, no thyromegaly Respiratory: Scant wheezing noted on the bases.  Using accessory muscles  cardiovascular: Regular rate and rhythm, no murmurs / rubs / gallops.  Bilateral 2+ pitting edema positive. 2+ pedal pulses. No carotid  bruits.  Abdomen: no tenderness, no masses palpated. No hepatosplenomegaly. Bowel sounds positive.  Musculoskeletal: no clubbing / cyanosis. No joint deformity upper and lower extremities. Good ROM, no contractures. Normal muscle tone.  Skin: no rashes, lesions, ulcers. No induration Neurologic: CN 2-12 grossly intact. Sensation intact, DTR normal. Strength 5/5 in all 4.  Psychiatric: Normal judgment and insight. Alert and oriented x 3. Normal mood.    Labs on Admission: I have personally reviewed following labs and imaging studies  CBC: Recent Labs  Lab 01/19/22 1135 01/23/22 0333  WBC 9.8 13.1*  NEUTROABS 6.2 10.3*  HGB 12.1 12.4  HCT 36.4 39.9  MCV 91.9 96.4  PLT 167.0 99991111   Basic Metabolic Panel: Recent Labs  Lab 01/19/22 1135 01/23/22 0333  NA 135 133*  K 4.0 4.6  CL 89* 91*  CO2 40* 31  GLUCOSE 251* 539*  BUN 34* 25*  CREATININE 1.26* 1.08*  CALCIUM 9.2 8.9   GFR: Estimated Creatinine Clearance: 49.2 mL/min (A) (by C-G formula based on SCr of 1.08 mg/dL (H)). Liver Function Tests: No results for input(s): "AST", "ALT", "ALKPHOS", "BILITOT", "PROT", "ALBUMIN" in the last 168 hours. No results for input(s): "LIPASE", "AMYLASE" in the last 168 hours. No results for input(s): "AMMONIA" in the last 168 hours. Coagulation Profile: No results for input(s): "INR", "PROTIME" in the last 168 hours. Cardiac Enzymes: No results for input(s): "CKTOTAL", "CKMB", "CKMBINDEX", "TROPONINI" in the last 168 hours. BNP (last 3 results) Recent Labs    01/19/22 1135  PROBNP 496.0*   HbA1C: No results for input(s): "HGBA1C" in the last 72 hours. CBG: No results for input(s): "GLUCAP" in the last 168 hours. Lipid Profile: No results for input(s): "CHOL", "HDL", "LDLCALC", "TRIG", "CHOLHDL", "LDLDIRECT" in the last 72 hours. Thyroid Function Tests: No results for input(s): "TSH", "T4TOTAL", "FREET4", "T3FREE", "THYROIDAB" in the last 72 hours. Anemia Panel: No results for  input(s): "VITAMINB12", "FOLATE", "FERRITIN", "TIBC", "IRON", "RETICCTPCT" in the last 72 hours. Urine analysis:    Component Value Date/Time   COLORURINE YELLOW 04/29/2020 1550   APPEARANCEUR CLOUDY (A) 04/29/2020 1550  LABSPEC 1.025 04/29/2020 1550   PHURINE 5.5 04/29/2020 1550   GLUCOSEU 100 (A) 04/29/2020 1550   HGBUR NEGATIVE 04/29/2020 1550   BILIRUBINUR NEGATIVE 04/29/2020 1550   KETONESUR NEGATIVE 04/29/2020 1550   PROTEINUR 100 (A) 04/29/2020 1550   UROBILINOGEN 0.2 09/01/2013 1128   NITRITE NEGATIVE 04/29/2020 1550   LEUKOCYTESUR TRACE (A) 04/29/2020 1550    Radiological Exams on Admission: DG Chest Port 1 View  Result Date: 01/23/2022 CLINICAL DATA:  Increasing shortness of breath EXAM: PORTABLE CHEST 1 VIEW COMPARISON:  01/08/2022 FINDINGS: Shallow inspiration. Increasing bilateral pleural effusions and basilar infiltrates since prior study. Heart size appears enlarged but is obscured by the parenchymal process. No pneumothorax. Patient rotation limits examination. IMPRESSION: Increasing bilateral pleural effusions and basilar infiltrates since prior study. Electronically Signed   By: Burman Nieves M.D.   On: 01/23/2022 03:52    EKG: Independently reviewed.  Sinus rhythm, probable LVH, nonspecific T wave changes.  Assessment/Plan  Acute on chronic hypoxemic and hypercarbic respiratory failure in the setting of acute on chronic CHF: -Patient presented with shortness of breath, bilateral leg swelling.  Last echo on 5/22 shows preserved ejection fraction with grade 2 diastolic dysfunction and aortic sclerosis without stenosis -BNP 461.  Chest x-ray concerning for bilateral pleural effusion.   -Patient is currently on BiPAP.  Will admit patient on the stepdown unit -Continue Lasix IV twice daily.  Repeat echo. Will check covid and flu -Strict INO's and daily weight.  Monitor kidney function.  Check electrolytes.  Type 2 diabetes on long-term insulin with uncontrolled  hyperglycemia and diabetic retinopathy: -Last A1c 8.5%.  Blood sugar elevated upon arrival. -Hold metformin.  Start on sliding scale insulin.  Monitor blood sugar closely  COPD: -Continue home inhalers.  CKD stage IIIb: At baseline.  Continue to monitor.  Hypertension: Blood pressure elevated on arrival.  Now stable. -Resume home medications amlodipine and metoprolol  History of bipolar disorder/PTSD/depression: At baseline -Continue home meds Depakote, Klonopin, melatonin  Hyperlipidemia: Continue statin  GERD: Continue PPI  Leukocytosis: Likely secondary to recent steroid use.  Patient is afebrile.  Repeat CBC tomorrow AM.  DVT prophylaxis: Lovenox Code Status: Full code Family Communication: None present at bedside.  Plan of care discussed with patient in length and she verbalized understanding and agreed with it. Disposition Plan: Home Consults called: None Admission status: Inpatient   Ollen Bowl MD [facility]  If 7PM-7AM, please contact night-coverage [email or web address]  01/23/2022, 7:46 AM

## 2022-01-23 NOTE — Progress Notes (Signed)
Pt is resting well and no need of bipap at this time. Machine remained bedside.

## 2022-01-23 NOTE — ED Notes (Signed)
Pt resting and watching TV, NAD noted, even RR and unlabored, call bell within reach for assistance, side rails up x2 for safety, pt voiced no concerns or questions at this time, care on going, will continue to monitor.  

## 2022-01-23 NOTE — ED Notes (Signed)
Pt showing Afib on monitor, pt reports asymptomatic, denies CP or SOB. Pt has tremors, in results when "shaking" monitor shows rate of 120-140, however, pulse ox shows rate 90s-110.  Pt given her nighttime dose of metoprolol 50 mg PO, will continue to monitor.

## 2022-01-23 NOTE — ED Provider Notes (Signed)
[facility]-EMERGENCY DEPT Provider Note   CSN: [id number] Arrival date & time: 01/23/22  2683     History  Chief Complaint  Patient presents with   Shortness of Breath    Adrienne Bennett is a 61 y.o. female with COPD, PTSD/bipolar disorder, diastolic dysfunction, CHF, diabetes mellitus, diabetic retinopathy hyperlipidemia hypertension presents in respiratory distress.  History is limited by acuity of patient's condition.  Patient has had 2 days of worsening dyspnea.  She is normally on 2 L via nasal cannula but a lot arrives in the emergency department at 80% on 3 L.  Patient admits to having increased peripheral and central edema over the past several days along with weight gain.  She has a history of COPD and diastolic CHF.  She denies fever or chills.   Shortness of Breath      Home Medications Prior to Admission medications   Medication Sig Start Date End Date Taking? Authorizing Provider  albuterol (VENTOLIN HFA) 108 (90 Base) MCG/ACT inhaler Inhale 2 puffs into the lungs every 6 (six) hours as needed for wheezing or shortness of breath. 02/26/21   [name], MD  amLODipine (NORVASC) 10 MG tablet TAKE 1 TABLET BY MOUTH  DAILY Patient taking differently: Take 10 mg by mouth daily. 09/24/21   [name], MD  aspirin EC 81 MG tablet Take 81 mg by mouth daily. Swallow whole.    [provider]  atorvastatin (LIPITOR) 40 MG tablet Take 1 tablet (40 mg total) by mouth at bedtime. Patient taking differently: Take 40 mg by mouth daily. 10/27/21   [name], MD  clonazePAM (KLONOPIN) 1 MG tablet Take 1 mg by mouth 2 (two) times daily. 1 tab AM, 1 tab PM, rx by psychiatry    [provider]  cyclobenzaprine (FLEXERIL) 10 MG tablet TAKE 1 TABLET BY MOUTH  TWICE DAILY AS NEEDED FOR  MUSCLE SPASM(S) Patient taking differently: Take 10 mg by mouth 2 (two) times daily as needed for muscle spasms. 05/19/21   [name], MD  divalproex (DEPAKOTE) 250 MG  DR tablet Take 250-500 mg by mouth 2 (two) times daily. 1 tab AM, 2 tabs PM    [provider]  fluticasone furoate-vilanterol (BREO ELLIPTA) 100-25 MCG/ACT AEPB Inhale 1 puff into the lungs daily. 01/10/22 02/09/22  Lanae Boast, MD  furosemide (LASIX) 40 MG tablet Take 1 tablet (40 mg total) by mouth 2 (two) times daily. 01/19/22   [name], MD  insulin degludec (TRESIBA) 200 UNIT/ML FlexTouch Pen Inject 50 Units into the skin at bedtime. 05/02/20   [name], MD  ipratropium-albuterol (DUONEB) 0.5-2.5 (3) MG/3ML SOLN Take 3 mLs by nebulization 3 (three) times daily as needed. 01/10/22 02/09/22  Lanae Boast, MD  ketoconazole (NIZORAL) 2 % cream Apply 1 application topically 2 (two) times daily. For 10 days Patient taking differently: Apply 1 application  topically daily as needed for irritation. 03/27/21   [name], MD  Melatonin 10 MG TABS Take 10 mg by mouth at bedtime.    [provider]  metoprolol tartrate (LOPRESSOR) 50 MG tablet TAKE 1 TABLET BY MOUTH TWICE  DAILY Patient taking differently: Take 75 mg by mouth 2 (two) times daily. 11/18/21   [name], MD  OXYGEN Inhale 2 L/min into the lungs continuous.    [provider]  tiZANidine (ZANAFLEX) 2 MG tablet Take 1 tablet (2 mg total) by mouth 3 (three) times daily. Patient  not taking: Reported on 01/12/2022 12/26/21   Colon Branch, MD  umeclidinium bromide (INCRUSE ELLIPTA) 62.5 MCG/ACT AEPB Inhale 1 puff into the lungs daily. 01/10/22 02/09/22  Antonieta Pert, MD  Vitamin D, Ergocalciferol, (DRISDOL) 1.25 MG (50000 UNIT) CAPS capsule Take 1 capsule (50,000 Units total) by mouth every 7 (seven) days. 10/30/21   Colon Branch, MD      Allergies    Benztropine, Dairy aid [tilactase], Lisinopril, Penicillins, Xanax [alprazolam], and Monistat 3 combo pack app  [miconazole nitrate]    Review of Systems   Review of Systems  Respiratory:  Positive for shortness of breath.     Physical Exam Updated Vital Signs BP 136/68    Pulse 71   Temp 97.9 F (36.6 C) (Oral)   Resp (!) 22   LMP 08/24/2015 (Exact Date)   SpO2 95%  Physical Exam Vitals and nursing note reviewed.  Constitutional:      General: She is not in acute distress.    Appearance: She is well-developed. She is not diaphoretic.  HENT:     Head: Normocephalic and atraumatic.     Right Ear: External ear normal.     Left Ear: External ear normal.     Nose: Nose normal.     Mouth/Throat:     Mouth: Mucous membranes are moist.  Eyes:     General: No scleral icterus.    Conjunctiva/sclera: Conjunctivae normal.  Cardiovascular:     Rate and Rhythm: Normal rate and regular rhythm.     Heart sounds: Normal heart sounds. No murmur heard.    No friction rub. No gallop.  Pulmonary:     Effort: Prolonged expiration and respiratory distress present.     Breath sounds: No decreased air movement.     Comments: Breath sounds are nearly inaudible.  She has a prolonged expiratory phase.  She has minimal air movement.  Abdominal:     General: Bowel sounds are normal. There is no distension.     Palpations: Abdomen is soft. There is no mass.     Tenderness: There is no abdominal tenderness. There is no guarding.  Musculoskeletal:     Cervical back: Normal range of motion.     Right lower leg: 3+ Edema present.     Left lower leg: 3+ Edema present.  Skin:    General: Skin is warm and dry.  Neurological:     Mental Status: She is alert and oriented to person, place, and time.  Psychiatric:        Behavior: Behavior normal.    ED Results / Procedures / Treatments   Labs (all labs ordered are listed, but only abnormal results are displayed) Labs Reviewed  BASIC METABOLIC PANEL - Abnormal; Notable for the following components:      Result Value   Sodium 133 (*)    Chloride 91 (*)    Glucose, Bld 539 (*)    BUN 25 (*)    Creatinine, Ser 1.08 (*)    GFR, Estimated 58 (*)    All other components within normal limits  BRAIN NATRIURETIC PEPTIDE -  Abnormal; Notable for the following components:   B Natriuretic Peptide 461.0 (*)    All other components within normal limits  CBC WITH DIFFERENTIAL/PLATELET - Abnormal; Notable for the following components:   WBC 13.1 (*)    Neutro Abs 10.3 (*)    All other components within normal limits  BLOOD GAS, VENOUS - Abnormal; Notable for the following components:  pCO2, Ven 82 (*)    pO2, Ven 50 (*)    Bicarbonate 37.7 (*)    Acid-Base Excess 7.9 (*)    All other components within normal limits  BLOOD GAS, VENOUS - Abnormal; Notable for the following components:   pCO2, Ven 70 (*)    pO2, Ven <31 (*)    Bicarbonate 40.5 (*)    Acid-Base Excess 12.4 (*)    All other components within normal limits    EKG EKG Interpretation  Date/Time:  Friday January 23 2022 03:34:19 EDT Ventricular Rate:  74 PR Interval:  179 QRS Duration: 97 QT Interval:  377 QTC Calculation: 419 R Axis:   77 Text Interpretation: Sinus rhythm Probable left ventricular hypertrophy Nonspecific T abnrm, anterolateral leads No acute changes Confirmed by Drema Pry [id number]) on 01/23/2022 4:47:31 AM  Radiology DG Chest Port 1 View  Result Date: 01/23/2022 CLINICAL DATA:  Increasing shortness of breath EXAM: PORTABLE CHEST 1 VIEW COMPARISON:  01/08/2022 FINDINGS: Shallow inspiration. Increasing bilateral pleural effusions and basilar infiltrates since prior study. Heart size appears enlarged but is obscured by the parenchymal process. No pneumothorax. Patient rotation limits examination. IMPRESSION: Increasing bilateral pleural effusions and basilar infiltrates since prior study. Electronically Signed   By: Burman Nieves M.D.   On: 01/23/2022 03:52    Procedures .Critical Care  Performed by: Arthor Captain, PA-C Authorized by: Arthor Captain, PA-C   Critical care provider statement:    Critical care time (minutes):  75   Critical care time was exclusive of:  Separately billable procedures and treating other  patients   Critical care was necessary to treat or prevent imminent or life-threatening deterioration of the following conditions:  Respiratory failure   Critical care was time spent personally by me on the following activities:  Development of treatment plan with patient or surrogate, discussions with consultants, evaluation of patient's response to treatment, examination of patient, ordering and review of laboratory studies, ordering and review of radiographic studies, ordering and performing treatments and interventions, pulse oximetry, re-evaluation of patient's condition and review of old charts     Medications Ordered in ED Medications  albuterol (VENTOLIN HFA) 108 (90 Base) MCG/ACT inhaler (  Not Given 01/23/22 0342)  insulin aspart (novoLOG) injection 0-15 Units (has no administration in time range)  furosemide (LASIX) injection 20 mg (has no administration in time range)  ipratropium (ATROVENT) 0.02 % nebulizer solution (  Given 01/23/22 0341)  albuterol (PROVENTIL) (2.5 MG/3ML) 0.083% nebulizer solution (  Given 01/23/22 0345)  nitroGLYCERIN (NITROGLYN) 2 % ointment 1 inch (1 inch Topical Given 01/23/22 0400)  furosemide (LASIX) injection 40 mg (40 mg Intravenous Given 01/23/22 0400)    ED Course/ Medical Decision Making/ A&P Clinical Course as of 01/23/22 0643  Fri Jan 23, 2022  0341 Patient arrives to the emergency department in respiratory distress. The emergent differential diagnosis for shortness of breath includes, but is not limited to, Pulmonary edema, bronchoconstriction, Pneumonia, Pulmonary embolism, Pneumotherax/ Hemothorax, Dysrythmia, ACS.   Patient currently is presenting what appears to be a mixed respiratory failure secondary to CHF exacerbation or COPD exacerbation.  She does appear to have significant peripheral edema.  Awaiting COPD.  BiPAP ordered immediately. [AH]  0346 pCO2, Ven(!!): 82 Patient is hypercarbic to 82  [AH]  0346 WBC(!): 13.1 [AH]  0349 DG Chest  [facility] I personally visualized and interpreted plain film of the chest.  Her symptoms are concerning for [AH]  0430 Glucose(!!): 539 Patient recently on steroid  taper  [AH]  [id number] Patient breathing only mildly improved [AH]    Clinical Course User Index [AH] Margarita Mail, PA-C                           Medical Decision Making Amount and/or Complexity of Data Reviewed Labs: ordered. Radiology: ordered and independent interpretation performed. Discussion of management or test interpretation with external provider(s): Dr. Nevada Crane for admission  Risk Decision regarding hospitalization.  This patient presents to the ED for concern of sob, this involves an extensive number of treatment options, and is a complaint that carries with it a high risk of complications and morbidity.    Co morbidities that complicate the patient evaluation  copd, dm, htn, chf   Additional history obtained:  Additional history obtained from EMR    Lab Tests:  I Ordered, and personally interpreted labs.  The pertinent results include:   vbg- hypercarbia-downtrending, elevated BNP   Imaging Studies ordered:  I ordered imaging studies including cxr I independently visualized and interpreted imaging which showed pulm edema - as per ed course I agree with the radiologist interpretation   Cardiac Monitoring:  The patient was maintained on a cardiac monitor.  I personally viewed and interpreted the cardiac monitored which showed an underlying rhythm of: sinus tachycardia   Medicines ordered and prescription drug management:  I ordered medication including albuterol, nitroglycerin, Lasix for mixed COPD/CHF exacerbation Reevaluation of the patient after these medicines showed that the patient improved I have reviewed the patients home medicines and have made adjustments as needed   Test Considered:  CT angiogram to rule out pulmonary embolus however have extremely low suspicion   Critical  Interventions:  BiPAP, medications   Consultations Obtained:   Problem List / ED Course:  Hypercapnic respiratory failure   Reevaluation:  After the interventions noted above, I reevaluated the patient and found that they have :improved     Dispostion:  After consideration of the diagnostic results and the patients response to treatment, I feel that the patent would benefit from admission.         Final Clinical Impression(s) / ED Diagnoses Final diagnoses:  Acute respiratory failure with hypercapnia [facility])    Rx / DC Orders ED Discharge Orders     None         Margarita Mail, PA-C 01/23/22 [id number]    Fatima Blank, MD 01/23/22 2314

## 2022-01-23 NOTE — ED Notes (Signed)
Patient placed on BIPAP 14/6, Rate 10 , o2 35% . Respiratory at bedside .

## 2022-01-23 NOTE — ED Notes (Signed)
Critical lab po2 less than 31. MDS cardama made aware.

## 2022-01-23 NOTE — ED Triage Notes (Signed)
Pt came in with c/o SOB. Retractions noted. Pt states it has been ongoing for two days. Used nebs at home, but states they are making her have pain in her chest. Hx CHF and COPD. Pt 80% on 3L. PT sats on 6L are 89%. A+O times four

## 2022-01-24 ENCOUNTER — Inpatient Hospital Stay (HOSPITAL_COMMUNITY): Payer: Medicare Other

## 2022-01-24 DIAGNOSIS — I5033 Acute on chronic diastolic (congestive) heart failure: Secondary | ICD-10-CM | POA: Diagnosis not present

## 2022-01-24 DIAGNOSIS — E785 Hyperlipidemia, unspecified: Secondary | ICD-10-CM

## 2022-01-24 DIAGNOSIS — N1832 Chronic kidney disease, stage 3b: Secondary | ICD-10-CM

## 2022-01-24 DIAGNOSIS — K2100 Gastro-esophageal reflux disease with esophagitis, without bleeding: Secondary | ICD-10-CM

## 2022-01-24 DIAGNOSIS — J9621 Acute and chronic respiratory failure with hypoxia: Secondary | ICD-10-CM | POA: Diagnosis not present

## 2022-01-24 DIAGNOSIS — J9622 Acute and chronic respiratory failure with hypercapnia: Secondary | ICD-10-CM

## 2022-01-24 LAB — CBC WITH DIFFERENTIAL/PLATELET
Abs Immature Granulocytes: 0.03 10*3/uL (ref 0.00–0.07)
Basophils Absolute: 0 10*3/uL (ref 0.0–0.1)
Basophils Relative: 0 %
Eosinophils Absolute: 0.2 10*3/uL (ref 0.0–0.5)
Eosinophils Relative: 2 %
HCT: 38.5 % (ref 36.0–46.0)
Hemoglobin: 11.8 g/dL — ABNORMAL LOW (ref 12.0–15.0)
Immature Granulocytes: 0 %
Lymphocytes Relative: 31 %
Lymphs Abs: 2.9 10*3/uL (ref 0.7–4.0)
MCH: 30.5 pg (ref 26.0–34.0)
MCHC: 30.6 g/dL (ref 30.0–36.0)
MCV: 99.5 fL (ref 80.0–100.0)
Monocytes Absolute: 0.7 10*3/uL (ref 0.1–1.0)
Monocytes Relative: 8 %
Neutro Abs: 5.7 10*3/uL (ref 1.7–7.7)
Neutrophils Relative %: 59 %
Platelets: 205 10*3/uL (ref 150–400)
RBC: 3.87 MIL/uL (ref 3.87–5.11)
RDW: 14.6 % (ref 11.5–15.5)
WBC: 9.6 10*3/uL (ref 4.0–10.5)
nRBC: 0 % (ref 0.0–0.2)

## 2022-01-24 LAB — BASIC METABOLIC PANEL
Anion gap: 8 (ref 5–15)
BUN: 31 mg/dL — ABNORMAL HIGH (ref 8–23)
CO2: 38 mmol/L — ABNORMAL HIGH (ref 22–32)
Calcium: 9 mg/dL (ref 8.9–10.3)
Chloride: 95 mmol/L — ABNORMAL LOW (ref 98–111)
Creatinine, Ser: 1.26 mg/dL — ABNORMAL HIGH (ref 0.44–1.00)
GFR, Estimated: 49 mL/min — ABNORMAL LOW (ref >60)
Glucose, Bld: 86 mg/dL (ref 70–99)
Potassium: 3 mmol/L — ABNORMAL LOW (ref 3.5–5.1)
Sodium: 141 mmol/L (ref 135–145)

## 2022-01-24 LAB — MAGNESIUM: Magnesium: 2.1 mg/dL (ref 1.7–2.4)

## 2022-01-24 LAB — HEPATIC FUNCTION PANEL
ALT: 31 U/L (ref 0–44)
AST: 31 U/L (ref 15–41)
Albumin: 2.9 g/dL — ABNORMAL LOW (ref 3.5–5.0)
Alkaline Phosphatase: 109 U/L (ref 38–126)
Bilirubin, Direct: 0.1 mg/dL (ref 0.0–0.2)
Indirect Bilirubin: 0.5 mg/dL (ref 0.3–0.9)
Total Bilirubin: 0.6 mg/dL (ref 0.3–1.2)
Total Protein: 6.9 g/dL (ref 6.5–8.1)

## 2022-01-24 LAB — CBG MONITORING, ED
Glucose-Capillary: 107 mg/dL — ABNORMAL HIGH (ref 70–99)
Glucose-Capillary: 264 mg/dL — ABNORMAL HIGH (ref 70–99)
Glucose-Capillary: 88 mg/dL (ref 70–99)

## 2022-01-24 LAB — GLUCOSE, CAPILLARY
Glucose-Capillary: 197 mg/dL — ABNORMAL HIGH (ref 70–99)
Glucose-Capillary: 348 mg/dL — ABNORMAL HIGH (ref 70–99)
Glucose-Capillary: 387 mg/dL — ABNORMAL HIGH (ref 70–99)

## 2022-01-24 LAB — PHOSPHORUS: Phosphorus: 5.2 mg/dL — ABNORMAL HIGH (ref 2.5–4.6)

## 2022-01-24 MED ORDER — POTASSIUM CHLORIDE CRYS ER 20 MEQ PO TBCR
40.0000 meq | EXTENDED_RELEASE_TABLET | Freq: Two times a day (BID) | ORAL | Status: AC
  Administered 2022-01-24 – 2022-01-25 (×2): 40 meq via ORAL
  Filled 2022-01-24 (×2): qty 2

## 2022-01-24 NOTE — Progress Notes (Signed)
PROGRESS NOTE    Adrienne Bennett  [id number] DOB: Apr 14, 1961 DOA: 01/23/2022 PCP: Colon Branch, MD   Brief Narrative:  HPI per Dr. Early Osmond on 01/23/22  Adrienne Bennett is a 61 y.o. female with medical history significant of pretension, hyperlipidemia, type 2 diabetes, diastolic congestive heart failure, COPD, PTSD/bipolar disorder/depression present here for the evaluation of difficulty in breathing.   Patient reports that her symptoms started 2 weeks ago.  Her PCP recommended to use nebulizer at home which made her symptoms more worse.  She has dry cough, reports that she feels like fluid in her chest and associated with leg swelling.  Weight gain noted on recent PCP office visit.  Last night her symptoms started getting worse therefore her husband brought her to the ED for further evaluation and management.     She denies chest pain, palpitations, wheezing, fever, chills, sputum production, recent sick contact, nausea, vomiting, diarrhea, any UTI symptoms, headache, blurry vision or poor appetite.     No history of tobacco abuse, alcohol abuse, illicit drug use.  She has been compliant with her home medications.  Lives with her husband at home.  Independent on daily life activities.   Off note: Patient recently admitted acute on chronic hypoxemic respiratory failure due to COPD exacerbation and discharged on 6/17.     ED Course: Upon arrival to ED: Patient, afebrile, pulse 73, respiratory rate in 20s, blood pressure 196/54, requiring BiPAP.  Nitro paste applied and her blood pressure improved.  BNP: 461, chest x-ray shows bilateral pleural effusion, ABGs concerning for hypoxia and hypercapnia.  She was given IV Lasix.  [facility] consulted for admission for acute on chronic hypoxia and hypercapnia in the setting of congestive heart failure.  **Interim History   She is diuresing well and improving slowly. Remains on 5 Liters of Supplemental O2 when she has a baseline of 2  Liters. Continuing IV diuresis today and will need an Ambulatory Home O2 screen prior to D/C.    Assessment and Plan: No notes have been filed under this hospital service. Service: Hospitalist  Acute on chronic hypoxemic and hypercarbic respiratory failure in the setting of acute on chronic Diastolic CHF CHF: -Patient presented with shortness of breath, bilateral leg swelling.  Last echo on 5/22 shows preserved ejection fraction with grade 2 diastolic dysfunction and aortic sclerosis without stenosis -BNP 461.  Initial Chest x-ray concerning for bilateral pleural effusion.   -Patient was on BiPAP and now weaned to 5 L.  Will admit patient on the stepdown unit and changed to progressive -Continue Lasix IV 40 mg twice daily.   -Repeat echocardiogram shows no evidence of systolic or diastolic CHF now. -SpO2: 92 % O2 Flow Rate (L/min): 4 L/min FiO2 (%): 35 % -She will need continued supplemental oxygen via nasal cannula and wean O2 as tolerated -Continuous pulse oximetry maintain O2 saturation greater 90% -Strict INO's and daily weight.  Monitor kidney function.  Check electrolytes. -Unfortunately no I's and O's have been done and weight -Continue monitor for signs and symptoms of volume overload -Chest x-ray this a.m. showed "Extensive bibasilar areas of atelectasis and/or consolidation with superimposed moderate bilateral pleural effusions. Cardiomegaly." -We will add flutter valve and incentive spirometry   Type 2 diabetes on long-term insulin with uncontrolled hyperglycemia and diabetic retinopathy: -Last A1c 8.5%.  Blood sugar elevated upon arrival. -Hold metformin.  Start on sliding scale insulin.  Monitor blood sugar closely and adjust insulin regimen as necessary -CBGs ranging from  [id number]   COPD with chronic hypoxic respiratory failure on 2 L of supplemental oxygen at home -Continue home inhalers with Breo Ellipta and Incruse Ellipta and also has DuoNeb 3 MLS 3 times daily as needed  for wheezing or shortness of breath -We will add guaifenesin 1200 g p.o. twice daily, flutter valve and incentive spirometry -Unfortunately SARS-CoV-2 and influenz A and B panel still not done   CKD stage IIIb -Patient's BUNs/creatinine worsened in the setting of diuresis slightly and went from 25/1.08 and is now 31/1.26 -Avoid further nephrotoxic medications and contrast dyes if possible, hypotension and dehydration to ensure adequate renal perfusion and will need to renally adjust medications -Continue monitor and trend renal function carefully repeat CMP in a.m.  Hypertension -Blood pressure elevated on arrival.  Now stable. -Resume home medications amlodipine and metoprolol -Continue monitor blood pressures per protocol -Last blood pressure reading was 127/86   History of bipolar disorder/PTSD/depression -At baseline -Continue home meds Depakote, Klonopin, melatonin   Hyperlipidemia -Continue atorvastatin 40 mg p.o. nightly   GERD/GI prophylaxis -Not on PPI but will add if necessary   Leukocytosis -Likely secondary to recent steroid use from her recent COPD exacerbation.  Patient is afebrile.   -Continue monitor for signs and symptoms of infection and repeat CBC tomorrow AM.  Hypokalemia -In the setting of diuresis -Check mag level in the a.m. -Patient's potassium is now 3.0 and will replete with potassium chloride 40 mg twice daily x2 doses -Continue monitor replete as necessary  DVT prophylaxis: enoxaparin (LOVENOX) injection 40 mg Start: 01/23/22 0800 SCDs Start: 01/23/22 0743    Code Status: Full Code Family Communication: Discussed with husband at bedside  Disposition Plan:  Level of care: Progressive Status is: Inpatient Remains inpatient appropriate because: Continues to be dyspneic and on more oxygen than she is at baseline   Consultants:  None  Procedures:  Echocardiogram IMPRESSIONS     1. Left ventricular ejection fraction, by estimation, is 60 to  65%. The  left ventricle has normal function. The left ventricle has no regional  wall motion abnormalities. There is mild left ventricular hypertrophy.  Left ventricular diastolic parameters  were normal.   2. Right ventricular systolic function is normal. The right ventricular  size is normal.   3. Small mixed echo luscent area posterior to the AV groove may represent  a benign pericardial cyst can consider CT scan to further evaluate but not  likely clinically necessary.   4. The mitral valve is abnormal. Trivial mitral valve regurgitation. No  evidence of mitral stenosis.   5. Very mild stenosis by valve area mean gradient only 8 and DVI 0.68.  The aortic valve is normal in structure. There is moderate calcification  of the aortic valve. There is moderate thickening of the aortic valve.  Aortic valve regurgitation is mild.  Mild aortic valve stenosis.   6. The inferior vena cava is normal in size with greater than 50%  respiratory variability, suggesting right atrial pressure of 3 mmHg.   FINDINGS   Left Ventricle: Left ventricular ejection fraction, by estimation, is 60  to 65%. The left ventricle has normal function. The left ventricle has no  regional wall motion abnormalities. Definity contrast agent was given IV  to delineate the left ventricular   endocardial borders. The left ventricular internal cavity size was normal  in size. There is mild left ventricular hypertrophy. Left ventricular  diastolic parameters were normal.   Right Ventricle: The right ventricular size is normal. No  increase in  right ventricular wall thickness. Right ventricular systolic function is  normal.   Left Atrium: Left atrial size was normal in size.   Right Atrium: Right atrial size was normal in size.   Pericardium: Small mixed echo luscent area posterior to the AV groove may  represent a benign pericardial cyst can consider CT scan to further  evaluate but not likely clinically necessary.  There is no evidence of  pericardial effusion.   Mitral Valve: The mitral valve is abnormal. There is mild thickening of  the mitral valve leaflet(s). There is mild calcification of the mitral  valve leaflet(s). Trivial mitral valve regurgitation. No evidence of  mitral valve stenosis.   Tricuspid Valve: The tricuspid valve is normal in structure. Tricuspid  valve regurgitation is not demonstrated. No evidence of tricuspid  stenosis.   Aortic Valve: Very mild stenosis by valve area mean gradient only 8 and  DVI 0.68. The aortic valve is normal in structure. There is moderate  calcification of the aortic valve. There is moderate thickening of the  aortic valve. Aortic valve regurgitation  is mild. Aortic regurgitation PHT measures 589 msec. Mild aortic stenosis  is present. Aortic valve mean gradient measures 8.0 mmHg. Aortic valve  peak gradient measures 14.5 mmHg. Aortic valve area, by VTI measures 1.92  cm.   Pulmonic Valve: The pulmonic valve was normal in structure. Pulmonic valve  regurgitation is mild. No evidence of pulmonic stenosis.   Aorta: The aortic root is normal in size and structure.   Venous: The inferior vena cava is normal in size with greater than 50%  respiratory variability, suggesting right atrial pressure of 3 mmHg.   IAS/Shunts: No atrial level shunt detected by color flow Doppler.      LEFT VENTRICLE  PLAX 2D  LVIDd:         4.15 cm     Diastology  LVIDs:         2.90 cm     LV e' medial:    6.74 cm/s  LV PW:         1.10 cm     LV E/e' medial:  17.1  LV IVS:        1.20 cm     LV e' lateral:   9.14 cm/s  LVOT diam:     1.90 cm     LV E/e' lateral: 12.6  LV SV:         71  LV SV Index:   42  LVOT Area:     2.84 cm     LV Volumes (MOD)  LV vol d, MOD A2C: 93.3 ml  LV vol d, MOD A4C: 91.7 ml  LV vol s, MOD A2C: 20.6 ml  LV vol s, MOD A4C: 29.0 ml  LV SV MOD A2C:     72.7 ml  LV SV MOD A4C:     91.7 ml  LV SV MOD BP:      67.2 ml   RIGHT  VENTRICLE          IVC  RV Basal diam:  3.20 cm  IVC diam: 1.70 cm  RV Mid diam:    2.20 cm   LEFT ATRIUM             Index        RIGHT ATRIUM           Index  LA diam:        3.50 cm 2.06 cm/m  RA Area:     10.10 cm  LA Vol (A2C):   44.1 ml 25.92 ml/m  RA Volume:   16.20 ml  9.52 ml/m  LA Vol (A4C):   26.2 ml 15.40 ml/m  LA Biplane Vol: 35.5 ml 20.87 ml/m   AORTIC VALVE                     PULMONIC VALVE  AV Area (Vmax):    1.91 cm      PV Vmax:          1.09 m/s  AV Area (Vmean):   1.88 cm      PV Peak grad:     4.8 mmHg  AV Area (VTI):     1.92 cm      PR End Diast Vel: 6.04 msec  AV Vmax:           190.50 cm/s  AV Vmean:          128.500 cm/s  AV VTI:            0.368 m  AV Peak Grad:      14.5 mmHg  AV Mean Grad:      8.0 mmHg  LVOT Vmax:         128.50 cm/s  LVOT Vmean:        85.050 cm/s  LVOT VTI:          0.250 m  LVOT/AV VTI ratio: 0.68  AI PHT:            589 msec     AORTA  Ao Root diam: 3.20 cm  Ao Asc diam:  3.20 cm   MITRAL VALVE                TRICUSPID VALVE  MV Area (PHT): 4.71 cm     TR Peak grad:   26.0 mmHg  MV Decel Time: 161 msec     TR Vmax:        255.00 cm/s  MV E velocity: 115.00 cm/s  MV A velocity: 99.80 cm/s   SHUNTS  MV E/A ratio:  1.15         Systemic VTI:  0.25 m                              Systemic Diam: 1.90 cm   Antimicrobials:  Anti-infectives (From admission, onward)    None       Subjective: Seen and examined at bedside and was feeling better and was off the BiPAP.  Still has some dyspnea.  No nausea or vomiting.  Denies any chest pain.  No other concerns or complaints at this time.  Objective: Vitals:   01/24/22 0645 01/24/22 0700 01/24/22 0715 01/24/22 0730  BP:  (!) 147/67    Pulse: (!) 59 (!) 58 (!) 58 (!) 59  Resp: 17 20 20  (!) 21  Temp:      TempSrc:      SpO2: 98% 97% 98% 98%   No intake or output data in the 24 hours ending 01/24/22 0858 There were no vitals filed for this  visit.  Examination: Physical Exam:  Constitutional: WN/WD overweight Caucasian female currently no acute distress Respiratory: Diminished to auscultation bilaterally with coarse breath sounds and some crackles and some slight rhonchi, no wheezing, rales. Normal respiratory effort and patient is not tachypenic. No accessory muscle use.  Unlabored breathing and wearing 5 L  supplemental oxygen via nasal cannula Cardiovascular: RRR, no murmurs / rubs / gallops. S1 and S2 auscultated.  Has 1+ extremity edema Abdomen: Soft, non-tender, distended secondary to body habitus. Bowel sounds positive.  GU: Deferred..  Skin: No rashes, lesions, ulcers on limited skin evaluation. No induration; Warm and dry.  Neurologic: CN 2-12 grossly intact with no focal deficits. Romberg sign and cerebellar reflexes not assessed.  Psychiatric: Normal judgment and insight. Alert and oriented x 3. Normal mood and appropriate affect.   Data Reviewed: I have personally reviewed following labs and imaging studies  CBC: Recent Labs  Lab 01/19/22 1135 01/23/22 0333 01/23/22 0743  WBC 9.8 13.1* 11.2*  NEUTROABS 6.2 10.3* 8.5*  HGB 12.1 12.4 11.9*  HCT 36.4 39.9 37.7  MCV 91.9 96.4 95.2  PLT 167.0 180 186   Basic Metabolic Panel: Recent Labs  Lab 01/19/22 1135 01/23/22 0333 01/23/22 0743 01/24/22 0445  NA 135 133*  --  141  K 4.0 4.6  --  3.0*  CL 89* 91*  --  95*  CO2 40* 31  --  38*  GLUCOSE 251* 539*  --  86  BUN 34* 25*  --  31*  CREATININE 1.26* 1.08* 1.15* 1.26*  CALCIUM 9.2 8.9  --  9.0  MG  --   --  2.0  --    GFR: Estimated Creatinine Clearance: 42.2 mL/min (A) (by C-G formula based on SCr of 1.26 mg/dL (H)). Liver Function Tests: No results for input(s): "AST", "ALT", "ALKPHOS", "BILITOT", "PROT", "ALBUMIN" in the last 168 hours. No results for input(s): "LIPASE", "AMYLASE" in the last 168 hours. No results for input(s): "AMMONIA" in the last 168 hours. Coagulation Profile: No results for  input(s): "INR", "PROTIME" in the last 168 hours. Cardiac Enzymes: No results for input(s): "CKTOTAL", "CKMB", "CKMBINDEX", "TROPONINI" in the last 168 hours. BNP (last 3 results) Recent Labs    01/19/22 1135  PROBNP 496.0*   HbA1C: No results for input(s): "HGBA1C" in the last 72 hours. CBG: Recent Labs  Lab 01/23/22 1725 01/23/22 2028 01/23/22 2150 01/24/22 0003 01/24/22 0403  GLUCAP 206* 202* 144* 107* 88   Lipid Profile: No results for input(s): "CHOL", "HDL", "LDLCALC", "TRIG", "CHOLHDL", "LDLDIRECT" in the last 72 hours. Thyroid Function Tests: Recent Labs    01/23/22 0744  TSH 1.008   Anemia Panel: No results for input(s): "VITAMINB12", "FOLATE", "FERRITIN", "TIBC", "IRON", "RETICCTPCT" in the last 72 hours. Sepsis Labs: No results for input(s): "PROCALCITON", "LATICACIDVEN" in the last 168 hours.  No results found for this or any previous visit (from the past 240 hour(s)).   Radiology Studies: ECHOCARDIOGRAM COMPLETE  Result Date: 01/23/2022    ECHOCARDIOGRAM REPORT   Patient Name:   Adrienne Bennett Date of Exam: 01/23/2022 Medical Rec #:  [id number]        Height:       61.0 in Accession #:    [id number]       Weight:       156.4 lb Date of Birth:  09/26/1960        BSA:          1.701 m Patient Age:    60 years         BP:           130/108 mmHg Patient Gender: F                HR:           82 bpm. Exam Location:  Inpatient Procedure: 2D Echo, Cardiac Doppler, Color Doppler and Intracardiac            Opacification Agent Indications:    Congestive heart failure  History:        Patient has prior history of Echocardiogram examinations, most                 recent 12/09/2020. CHF, COPD; Risk Factors:Hypertension, Diabetes                 and HLD.  Sonographer:    Joette Catching RCS Referring Phys: [id number] [place]  1. Left ventricular ejection fraction, by estimation, is 60 to 65%. The left ventricle has normal function. The left ventricle has no  regional wall motion abnormalities. There is mild left ventricular hypertrophy. Left ventricular diastolic parameters were normal.  2. Right ventricular systolic function is normal. The right ventricular size is normal.  3. Small mixed echo luscent area posterior to the AV groove may represent a benign pericardial cyst can consider CT scan to further evaluate but not likely clinically necessary.  4. The mitral valve is abnormal. Trivial mitral valve regurgitation. No evidence of mitral stenosis.  5. Very mild stenosis by valve area mean gradient only 8 and DVI 0.68. The aortic valve is normal in structure. There is moderate calcification of the aortic valve. There is moderate thickening of the aortic valve. Aortic valve regurgitation is mild. Mild aortic valve stenosis.  6. The inferior vena cava is normal in size with greater than 50% respiratory variability, suggesting right atrial pressure of 3 mmHg. FINDINGS  Left Ventricle: Left ventricular ejection fraction, by estimation, is 60 to 65%. The left ventricle has normal function. The left ventricle has no regional wall motion abnormalities. Definity contrast agent was given IV to delineate the left ventricular  endocardial borders. The left ventricular internal cavity size was normal in size. There is mild left ventricular hypertrophy. Left ventricular diastolic parameters were normal. Right Ventricle: The right ventricular size is normal. No increase in right ventricular wall thickness. Right ventricular systolic function is normal. Left Atrium: Left atrial size was normal in size. Right Atrium: Right atrial size was normal in size. Pericardium: Small mixed echo luscent area posterior to the AV groove may represent a benign pericardial cyst can consider CT scan to further evaluate but not likely clinically necessary. There is no evidence of pericardial effusion. Mitral Valve: The mitral valve is abnormal. There is mild thickening of the mitral valve leaflet(s).  There is mild calcification of the mitral valve leaflet(s). Trivial mitral valve regurgitation. No evidence of mitral valve stenosis. Tricuspid Valve: The tricuspid valve is normal in structure. Tricuspid valve regurgitation is not demonstrated. No evidence of tricuspid stenosis. Aortic Valve: Very mild stenosis by valve area mean gradient only 8 and DVI 0.68. The aortic valve is normal in structure. There is moderate calcification of the aortic valve. There is moderate thickening of the aortic valve. Aortic valve regurgitation is mild. Aortic regurgitation PHT measures 589 msec. Mild aortic stenosis is present. Aortic valve mean gradient measures 8.0 mmHg. Aortic valve peak gradient measures 14.5 mmHg. Aortic valve area, by VTI measures 1.92 cm. Pulmonic Valve: The pulmonic valve was normal in structure. Pulmonic valve regurgitation is mild. No evidence of pulmonic stenosis. Aorta: The aortic root is normal in size and structure. Venous: The inferior vena cava is normal in size with greater than 50% respiratory variability, suggesting right atrial pressure of 3 mmHg. IAS/Shunts: No atrial level  shunt detected by color flow Doppler.  LEFT VENTRICLE PLAX 2D LVIDd:         4.15 cm     Diastology LVIDs:         2.90 cm     LV e' medial:    6.74 cm/s LV PW:         1.10 cm     LV E/e' medial:  17.1 LV IVS:        1.20 cm     LV e' lateral:   9.14 cm/s LVOT diam:     1.90 cm     LV E/e' lateral: 12.6 LV SV:         71 LV SV Index:   42 LVOT Area:     2.84 cm  LV Volumes (MOD) LV vol d, MOD A2C: 93.3 ml LV vol d, MOD A4C: 91.7 ml LV vol s, MOD A2C: 20.6 ml LV vol s, MOD A4C: 29.0 ml LV SV MOD A2C:     72.7 ml LV SV MOD A4C:     91.7 ml LV SV MOD BP:      67.2 ml RIGHT VENTRICLE          IVC RV Basal diam:  3.20 cm  IVC diam: 1.70 cm RV Mid diam:    2.20 cm LEFT ATRIUM             Index        RIGHT ATRIUM           Index LA diam:        3.50 cm 2.06 cm/m   RA Area:     10.10 cm LA Vol (A2C):   44.1 ml 25.92 ml/m  RA  Volume:   16.20 ml  9.52 ml/m LA Vol (A4C):   26.2 ml 15.40 ml/m LA Biplane Vol: 35.5 ml 20.87 ml/m  AORTIC VALVE                     PULMONIC VALVE AV Area (Vmax):    1.91 cm      PV Vmax:          1.09 m/s AV Area (Vmean):   1.88 cm      PV Peak grad:     4.8 mmHg AV Area (VTI):     1.92 cm      PR End Diast Vel: 6.04 msec AV Vmax:           190.50 cm/s AV Vmean:          128.500 cm/s AV VTI:            0.368 m AV Peak Grad:      14.5 mmHg AV Mean Grad:      8.0 mmHg LVOT Vmax:         128.50 cm/s LVOT Vmean:        85.050 cm/s LVOT VTI:          0.250 m LVOT/AV VTI ratio: 0.68 AI PHT:            589 msec  AORTA Ao Root diam: 3.20 cm Ao Asc diam:  3.20 cm MITRAL VALVE                TRICUSPID VALVE MV Area (PHT): 4.71 cm     TR Peak grad:   26.0 mmHg MV Decel Time: 161 msec     TR Vmax:        255.00 cm/s MV  E velocity: 115.00 cm/s MV A velocity: 99.80 cm/s   SHUNTS MV E/A ratio:  1.15         Systemic VTI:  0.25 m                             Systemic Diam: 1.90 cm Jenkins Rouge MD Electronically signed by Jenkins Rouge MD Signature Date/Time: 01/23/2022/2:27:02 PM    Final    DG Chest Port 1 View  Result Date: 01/23/2022 CLINICAL DATA:  Increasing shortness of breath EXAM: PORTABLE CHEST 1 VIEW COMPARISON:  01/08/2022 FINDINGS: Shallow inspiration. Increasing bilateral pleural effusions and basilar infiltrates since prior study. Heart size appears enlarged but is obscured by the parenchymal process. No pneumothorax. Patient rotation limits examination. IMPRESSION: Increasing bilateral pleural effusions and basilar infiltrates since prior study. Electronically Signed   By: Lucienne Capers M.D.   On: 01/23/2022 03:52    Scheduled Meds:  amLODipine  10 mg Oral Daily   aspirin EC  81 mg Oral Daily   atorvastatin  40 mg Oral QHS   clonazePAM  1 mg Oral BID   divalproex  250 mg Oral q morning   divalproex  500 mg Oral QHS   enoxaparin (LOVENOX) injection  40 mg Subcutaneous Q24H   fluticasone  furoate-vilanterol  1 puff Inhalation Daily   furosemide  40 mg Intravenous BID   insulin aspart  0-15 Units Subcutaneous Q4H   insulin aspart  0-15 Units Subcutaneous TID WC   insulin aspart  0-5 Units Subcutaneous QHS   melatonin  10 mg Oral QHS   metoprolol tartrate  50 mg Oral BID   sodium chloride flush  3 mL Intravenous Q12H   umeclidinium bromide  1 puff Inhalation Daily   Continuous Infusions:  sodium chloride      LOS: 1 day   Raiford Noble, DO [facility] Available via Epic secure chat 7am-7pm After these hours, please refer to coverage provider listed on [email or web address] 01/24/2022, 8:58 AM

## 2022-01-24 NOTE — ED Notes (Signed)
Pt remains asleep, NAD noted, call bell in reach, side rails up x2 for safety, care on going, will continue to monitor.

## 2022-01-24 NOTE — Hospital Course (Signed)
The patient is a 61 year old overweight Caucasian female with a past medical history significant for but limited to COPD, PTSD/bipolar disorder, chronic diastolic CHF, diabetes mellitus type 2 with diabetic retinopathy, hypertension, hyperlipidemia as well as other comorbidities who presented with respiratory distress.  She was recently discharged from the hospital after a COPD exacerbation and was at home and noticed worsening dyspnea over the last few days.  She normally wears 2 L of oxygen but she is desaturating to 80% on 3 L.  She had increased peripheral and central edema over the last several days and along with weight gain and ended up having to be placed on BiPAP.  She is started on diuresis and started improving.  She is weaned off of the BiPAP and admitted for further evaluation management for acute on chronic respiratory failure in setting of acute on chronic diastolic CHF   **Interim History   She is diuresing well and improving slowly. Remains on 5 Liters of Supplemental O2 when she has a baseline of 2 Liters.  We continued IV diuresis yesterday but held today given her renal function slightly bumped.  We will need cardiology further evaluation recommendations and will message cardiology for consultation in the morning.  Renal function has bumped up case was discussed with nephrology and they do not feel it is too significant recommending to continue monitor closely.  In the interim we have done a basic renal work-up with urine lites, urinalysis and renal ultrasound.  Renal function improved again so we will resume diuresis.  Cardiology was consulted and recommending continuing diuresis.  They feel that her renal function decrease further without resolution of her dyspnea and hypoxemia the patient should undergo a right heart cath as well as possibly a left heart cath.

## 2022-01-24 NOTE — ED Notes (Signed)
Pt continues to sleep, NAD noted, even RR and unlabored, call bell in reach, side rails up x2 for safety, care on going, will continue to monitor.  

## 2022-01-24 NOTE — ED Notes (Signed)
Pt appears to be sleeping, even RR and unlabored, NAD noted, call bell in reach, side rails up x2 for safety, care on going, will continue to monitor. 

## 2022-01-25 ENCOUNTER — Inpatient Hospital Stay (HOSPITAL_COMMUNITY): Payer: Medicare Other

## 2022-01-25 DIAGNOSIS — N1832 Chronic kidney disease, stage 3b: Secondary | ICD-10-CM | POA: Diagnosis not present

## 2022-01-25 DIAGNOSIS — I5033 Acute on chronic diastolic (congestive) heart failure: Secondary | ICD-10-CM | POA: Diagnosis not present

## 2022-01-25 DIAGNOSIS — E785 Hyperlipidemia, unspecified: Secondary | ICD-10-CM | POA: Diagnosis not present

## 2022-01-25 DIAGNOSIS — J9621 Acute and chronic respiratory failure with hypoxia: Secondary | ICD-10-CM | POA: Diagnosis not present

## 2022-01-25 LAB — PHOSPHORUS: Phosphorus: 5.5 mg/dL — ABNORMAL HIGH (ref 2.5–4.6)

## 2022-01-25 LAB — COMPREHENSIVE METABOLIC PANEL
ALT: 32 U/L (ref 0–44)
AST: 37 U/L (ref 15–41)
Albumin: 2.8 g/dL — ABNORMAL LOW (ref 3.5–5.0)
Alkaline Phosphatase: 107 U/L (ref 38–126)
Anion gap: 9 (ref 5–15)
BUN: 45 mg/dL — ABNORMAL HIGH (ref 8–23)
CO2: 36 mmol/L — ABNORMAL HIGH (ref 22–32)
Calcium: 8.5 mg/dL — ABNORMAL LOW (ref 8.9–10.3)
Chloride: 92 mmol/L — ABNORMAL LOW (ref 98–111)
Creatinine, Ser: 1.54 mg/dL — ABNORMAL HIGH (ref 0.44–1.00)
GFR, Estimated: 38 mL/min — ABNORMAL LOW (ref >60)
Glucose, Bld: 206 mg/dL — ABNORMAL HIGH (ref 70–99)
Potassium: 3.6 mmol/L (ref 3.5–5.1)
Sodium: 137 mmol/L (ref 135–145)
Total Bilirubin: 0.6 mg/dL (ref 0.3–1.2)
Total Protein: 6.8 g/dL (ref 6.5–8.1)

## 2022-01-25 LAB — CBC WITH DIFFERENTIAL/PLATELET
Abs Immature Granulocytes: 0.01 10*3/uL (ref 0.00–0.07)
Basophils Absolute: 0 10*3/uL (ref 0.0–0.1)
Basophils Relative: 0 %
Eosinophils Absolute: 0.2 10*3/uL (ref 0.0–0.5)
Eosinophils Relative: 2 %
HCT: 35.3 % — ABNORMAL LOW (ref 36.0–46.0)
Hemoglobin: 11.1 g/dL — ABNORMAL LOW (ref 12.0–15.0)
Immature Granulocytes: 0 %
Lymphocytes Relative: 34 %
Lymphs Abs: 2.7 10*3/uL (ref 0.7–4.0)
MCH: 30.2 pg (ref 26.0–34.0)
MCHC: 31.4 g/dL (ref 30.0–36.0)
MCV: 96.2 fL (ref 80.0–100.0)
Monocytes Absolute: 0.9 10*3/uL (ref 0.1–1.0)
Monocytes Relative: 11 %
Neutro Abs: 4.2 10*3/uL (ref 1.7–7.7)
Neutrophils Relative %: 53 %
Platelets: 207 10*3/uL (ref 150–400)
RBC: 3.67 MIL/uL — ABNORMAL LOW (ref 3.87–5.11)
RDW: 14.3 % (ref 11.5–15.5)
WBC: 8 10*3/uL (ref 4.0–10.5)
nRBC: 0 % (ref 0.0–0.2)

## 2022-01-25 LAB — URINALYSIS, ROUTINE W REFLEX MICROSCOPIC
Bacteria, UA: NONE SEEN
Bilirubin Urine: NEGATIVE
Glucose, UA: >=500 mg/dL — AB
Hgb urine dipstick: NEGATIVE
Ketones, ur: NEGATIVE mg/dL
Nitrite: NEGATIVE
Protein, ur: 30 mg/dL — AB
Specific Gravity, Urine: 1.01 (ref 1.005–1.030)
pH: 6 (ref 5.0–8.0)

## 2022-01-25 LAB — GLUCOSE, CAPILLARY
Glucose-Capillary: 210 mg/dL — ABNORMAL HIGH (ref 70–99)
Glucose-Capillary: 185 mg/dL — ABNORMAL HIGH (ref 70–99)
Glucose-Capillary: 332 mg/dL — ABNORMAL HIGH (ref 70–99)
Glucose-Capillary: 254 mg/dL — ABNORMAL HIGH (ref 70–99)

## 2022-01-25 LAB — SODIUM, URINE, RANDOM: Sodium, Ur: 17 mmol/L

## 2022-01-25 LAB — BRAIN NATRIURETIC PEPTIDE: B Natriuretic Peptide: 185.9 pg/mL — ABNORMAL HIGH (ref 0.0–100.0)

## 2022-01-25 LAB — CREATININE, URINE, RANDOM: Creatinine, Urine: 33.5 mg/dL

## 2022-01-25 LAB — MAGNESIUM: Magnesium: 2.2 mg/dL (ref 1.7–2.4)

## 2022-01-25 NOTE — Progress Notes (Signed)
PROGRESS NOTE    [name] ISLAND  [id number] DOB: [birth year] DOA: 01/23/2022 PCP: Colon Branch, MD   Brief Narrative:  HPI per Dr. Early Osmond on 01/23/22  Adrienne Bennett is a 61 y.o. female with medical history significant of pretension, hyperlipidemia, type 2 diabetes, diastolic congestive heart failure, COPD, PTSD/bipolar disorder/depression present here for the evaluation of difficulty in breathing.   Patient reports that her symptoms started 2 weeks ago.  Her PCP recommended to use nebulizer at home which made her symptoms more worse.  She has dry cough, reports that she feels like fluid in her chest and associated with leg swelling.  Weight gain noted on recent PCP office visit.  Last night her symptoms started getting worse therefore her husband brought her to the ED for further evaluation and management.     She denies chest pain, palpitations, wheezing, fever, chills, sputum production, recent sick contact, nausea, vomiting, diarrhea, any UTI symptoms, headache, blurry vision or poor appetite.     No history of tobacco abuse, alcohol abuse, illicit drug use.  She has been compliant with her home medications.  Lives with her husband at home.  Independent on daily life activities.   Off note: Patient recently admitted acute on chronic hypoxemic respiratory failure due to COPD exacerbation and discharged on 6/17.     ED Course: Upon arrival to ED: Patient, afebrile, pulse 73, respiratory rate in 20s, blood pressure 196/54, requiring BiPAP.  Nitro paste applied and her blood pressure improved.  BNP: 461, chest x-ray shows bilateral pleural effusion, ABGs concerning for hypoxia and hypercapnia.  She was given IV Lasix.  [facility] consulted for admission for acute on chronic hypoxia and hypercapnia in the setting of congestive heart failure.  **Interim History   She is diuresing well and improving slowly. Remains on 5 Liters of Supplemental O2 when she has a baseline of 2  Liters.  We continued IV diuresis yesterday but held today given her renal function slightly bumped.  We will need cardiology further evaluation recommendations and will message cardiology for consultation in the morning.  Renal function has bumped up case was discussed with nephrology and they do not feel it is too significant recommending to continue monitor closely.  In the interim we have done a basic renal work-up with urine lites, urinalysis and renal ultrasound.   Assessment and Plan:  Acute on chronic hypoxemic and hypercarbic respiratory failure in the setting of acute on chronic Diastolic CHF CHF: -Patient presented with shortness of breath, bilateral leg swelling.  Last echo on 5/22 shows preserved ejection fraction with grade 2 diastolic dysfunction and aortic sclerosis without stenosis -BNP 461 on admission and trended down to 185.9.  Initial Chest x-ray concerning for bilateral pleural effusion.   -Patient was on BiPAP and now weaned to 5 L.  Will admit patient on the stepdown unit and changed to progressive -Continued Lasix IV 40 mg twice daily however renal function worsened so diuresis was held today -Repeat echocardiogram shows no evidence of systolic or diastolic CHF now. -SpO2: 98 % O2 Flow Rate (L/min): 5.5 L/min FiO2 (%): 35 % -She will need continued supplemental oxygen via nasal cannula and wean O2 as tolerated -Continuous pulse oximetry maintain O2 saturation greater 90% -Strict INO's and daily weight.  Monitor kidney function.  Check electrolytes. -She -960 mL since admission -Continue monitor for signs and symptoms of volume overload -Chest x-ray yesterday a.m. showed "Extensive bibasilar areas of atelectasis and/or consolidation with superimposed moderate  bilateral pleural effusions. Cardiomegaly." -Chest x-ray today showed "Compared with yesterday, possible mild superimposed pulmonary edema. The bibasilar airspace opacities and pleural effusions have not significantly  changed." -We will add flutter valve and incentive spirometry -Repeat chest x-ray in the a.m. and will need ambulatory home O2 screen prior to discharge; since Lasix has been held we will call cardiology in the morning for further evaluation recommendations; case was discussed with nephrology who feels that her creatinine is not as significant for them to get involved so they recommend getting cardiology to further evaluate   Type 2 diabetes on long-term insulin with uncontrolled hyperglycemia and diabetic retinopathy: -Last A1c 8.5%.  Blood sugar elevated upon arrival. -Hold metformin.  Start on sliding scale insulin.  Monitor blood sugar closely and adjust insulin regimen as necessary -CBGs ranging from 185-332   COPD with chronic hypoxic respiratory failure on 2 L of supplemental oxygen at home -Continue home inhalers with Breo Ellipta and Incruse Ellipta and also has DuoNeb 3 MLS 3 times daily as needed for wheezing or shortness of breath -We will add guaifenesin 1200 g p.o. twice daily, flutter valve and incentive spirometry -Unfortunately SARS-CoV-2 and influenz A and B panel still not done for unclear reasons   AKI on CKD stage IIIb -Patient's BUNs/creatinine worsened in the setting of diuresis slightly and went from 25/1.08 and is now 31/1.26 yesterday and further worsened to 45/1.54 so diuresis has been held currently -Avoid further nephrotoxic medications and contrast dyes if possible, hypotension and dehydration to ensure adequate renal perfusion and will need to renally adjust medications -Renal ultrasound obtained and urinalysis done and showed clear urine with straw-colored greater than 500 glucose, small leukocytes, negative nitrites and no bacteria seen with 11-20 WBCs; urine creatinine 34.84 and urine sodium was 14 -Renal ultrasound done and showed "No acute findings.  No hydronephrosis. Single right and 2 left renal cysts." -Continue monitor and trend renal function carefully  repeat CMP in a.m.   Hypertension -Blood pressure elevated on arrival.  Now stable. -Resume home medications amlodipine and metoprolol -Continue monitor blood pressures per protocol -Last blood pressure reading was 137/75   History of bipolar disorder/PTSD/depression -At baseline -Continue home meds Depakote, Klonopin, melatonin   Hyperlipidemia -Continue atorvastatin 40 mg p.o. nightly   GERD/GI prophylaxis -Not on PPI but will add if necessary   Leukocytosis -Likely secondary to recent steroid use from her recent COPD exacerbation.  Patient is afebrile.   -Continue monitor for signs and symptoms of infection and repeat CBC tomorrow AM.   Hypokalemia -In the setting of diuresis -Check mag level in the a.m. -Patient's potassium is now 3.0 and will replete with potassium chloride 40 mg twice daily x2 doses -Continue monitor replete as necessary  DVT prophylaxis: enoxaparin (LOVENOX) injection 40 mg Start: 01/23/22 0800 SCDs Start: 01/23/22 0743    Code Status: Full Code Family Communication: Discussed with husband at bedside  Disposition Plan:  Level of care: Progressive Status is: Inpatient Remains inpatient appropriate because: Continues to be volume overloaded but renal function slightly worsened so diuresis has been held.  We will consult cardiology for formal consultation in the morning   Consultants:  None but case was discussed with nephrology  Procedures:  Echocardiogram IMPRESSIONS     1. Left ventricular ejection fraction, by estimation, is 60 to 65%. The  left ventricle has normal function. The left ventricle has no regional  wall motion abnormalities. There is mild left ventricular hypertrophy.  Left ventricular diastolic parameters  were  normal.   2. Right ventricular systolic function is normal. The right ventricular  size is normal.   3. Small mixed echo luscent area posterior to the AV groove may represent  a benign pericardial cyst can consider  CT scan to further evaluate but not  likely clinically necessary.   4. The mitral valve is abnormal. Trivial mitral valve regurgitation. No  evidence of mitral stenosis.   5. Very mild stenosis by valve area mean gradient only 8 and DVI 0.68.  The aortic valve is normal in structure. There is moderate calcification  of the aortic valve. There is moderate thickening of the aortic valve.  Aortic valve regurgitation is mild.  Mild aortic valve stenosis.   6. The inferior vena cava is normal in size with greater than 50%  respiratory variability, suggesting right atrial pressure of 3 mmHg.   FINDINGS   Left Ventricle: Left ventricular ejection fraction, by estimation, is 60  to 65%. The left ventricle has normal function. The left ventricle has no  regional wall motion abnormalities. Definity contrast agent was given IV  to delineate the left ventricular   endocardial borders. The left ventricular internal cavity size was normal  in size. There is mild left ventricular hypertrophy. Left ventricular  diastolic parameters were normal.   Right Ventricle: The right ventricular size is normal. No increase in  right ventricular wall thickness. Right ventricular systolic function is  normal.   Left Atrium: Left atrial size was normal in size.   Right Atrium: Right atrial size was normal in size.   Pericardium: Small mixed echo luscent area posterior to the AV groove may  represent a benign pericardial cyst can consider CT scan to further  evaluate but not likely clinically necessary. There is no evidence of  pericardial effusion.   Mitral Valve: The mitral valve is abnormal. There is mild thickening of  the mitral valve leaflet(s). There is mild calcification of the mitral  valve leaflet(s). Trivial mitral valve regurgitation. No evidence of  mitral valve stenosis.   Tricuspid Valve: The tricuspid valve is normal in structure. Tricuspid  valve regurgitation is not demonstrated. No  evidence of tricuspid  stenosis.   Aortic Valve: Very mild stenosis by valve area mean gradient only 8 and  DVI 0.68. The aortic valve is normal in structure. There is moderate  calcification of the aortic valve. There is moderate thickening of the  aortic valve. Aortic valve regurgitation  is mild. Aortic regurgitation PHT measures 589 msec. Mild aortic stenosis  is present. Aortic valve mean gradient measures 8.0 mmHg. Aortic valve  peak gradient measures 14.5 mmHg. Aortic valve area, by VTI measures 1.92  cm.   Pulmonic Valve: The pulmonic valve was normal in structure. Pulmonic valve  regurgitation is mild. No evidence of pulmonic stenosis.   Aorta: The aortic root is normal in size and structure.   Venous: The inferior vena cava is normal in size with greater than 50%  respiratory variability, suggesting right atrial pressure of 3 mmHg.   IAS/Shunts: No atrial level shunt detected by color flow Doppler.      LEFT VENTRICLE  PLAX 2D  LVIDd:         4.15 cm     Diastology  LVIDs:         2.90 cm     LV e' medial:    6.74 cm/s  LV PW:         1.10 cm     LV E/e' medial:  17.1  LV IVS:        1.20 cm     LV e' lateral:   9.14 cm/s  LVOT diam:     1.90 cm     LV E/e' lateral: 12.6  LV SV:         71  LV SV Index:   42  LVOT Area:     2.84 cm     LV Volumes (MOD)  LV vol d, MOD A2C: 93.3 ml  LV vol d, MOD A4C: 91.7 ml  LV vol s, MOD A2C: 20.6 ml  LV vol s, MOD A4C: 29.0 ml  LV SV MOD A2C:     72.7 ml  LV SV MOD A4C:     91.7 ml  LV SV MOD BP:      67.2 ml   RIGHT VENTRICLE          IVC  RV Basal diam:  3.20 cm  IVC diam: 1.70 cm  RV Mid diam:    2.20 cm   LEFT ATRIUM             Index        RIGHT ATRIUM           Index  LA diam:        3.50 cm 2.06 cm/m   RA Area:     10.10 cm  LA Vol (A2C):   44.1 ml 25.92 ml/m  RA Volume:   16.20 ml  9.52 ml/m  LA Vol (A4C):   26.2 ml 15.40 ml/m  LA Biplane Vol: 35.5 ml 20.87 ml/m   AORTIC VALVE                      PULMONIC VALVE  AV Area (Vmax):    1.91 cm      PV Vmax:          1.09 m/s  AV Area (Vmean):   1.88 cm      PV Peak grad:     4.8 mmHg  AV Area (VTI):     1.92 cm      PR End Diast Vel: 6.04 msec  AV Vmax:           190.50 cm/s  AV Vmean:          128.500 cm/s  AV VTI:            0.368 m  AV Peak Grad:      14.5 mmHg  AV Mean Grad:      8.0 mmHg  LVOT Vmax:         128.50 cm/s  LVOT Vmean:        85.050 cm/s  LVOT VTI:          0.250 m  LVOT/AV VTI ratio: 0.68  AI PHT:            589 msec     AORTA  Ao Root diam: 3.20 cm  Ao Asc diam:  3.20 cm   MITRAL VALVE                TRICUSPID VALVE  MV Area (PHT): 4.71 cm     TR Peak grad:   26.0 mmHg  MV Decel Time: 161 msec     TR Vmax:        255.00 cm/s  MV E velocity: 115.00 cm/s  MV A velocity: 99.80 cm/s   SHUNTS  MV E/A ratio:  1.15  Systemic VTI:  0.25 m                              Systemic Diam: 1.90 cm   Renal ultrasound  Antimicrobials:  Anti-infectives (From admission, onward)    None       Subjective: Seen and examined at bedside and thinks her legs are looking skinnier but still feels somewhat dyspneic and states that she desaturated quite significantly last night and her oxygen was excellently removed.  Now on 5-1/2 L.  Continues to have some congestion in her chest slightly and is a little short of breath still.  No lightheadedness or dizziness.  Denies any other concerns or complaints at this time.  Objective: Vitals:   01/25/22 0401 01/25/22 0406 01/25/22 0700 01/25/22 1407  BP: (!) 141/66   137/75  Pulse: 63   67  Resp: 18   20  Temp: 98 F (36.7 C)   98.2 F (36.8 C)  TempSrc: Oral   Oral  SpO2: 96%  98% 98%  Weight:  68.7 kg    Height:        Intake/Output Summary (Last 24 hours) at 01/25/2022 1730 Last data filed at 01/25/2022 1020 Gross per 24 hour  Intake 340 ml  Output 1200 ml  Net -860 ml   Filed Weights   01/24/22 1528 01/25/22 0406  Weight: 68.3 kg 68.7 kg    Examination: Physical Exam:  Constitutional: WN/WD overweight Caucasian female currently in no acute distress appears calm sitting in the chair Respiratory: Diminished to auscultation bilaterally with coarse breath sounds and some crackles, no wheezing, rales, rhonchi. Normal respiratory effort and patient is not tachypenic. No accessory muscle use.  Unlabored breathing but is wearing 5 and half liters now Cardiovascular: RRR, no murmurs / rubs / gallops. S1 and S2 auscultated.  Has 1+ lower extremity edema Abdomen: Soft, non-tender, distended secondary body habitus.  Bowel sounds positive.  GU: Deferred. Musculoskeletal: No clubbing / cyanosis of digits/nails. No joint deformity upper and lower extremities.  Skin: No rashes, lesions, ulcers on limited skin evaluation. No induration; Warm and dry.  Neurologic: CN 2-12 grossly intact with no focal deficits. Romberg sign and cerebellar reflexes not assessed.  Psychiatric: Normal judgment and insight. Alert and oriented x 3. Normal mood and appropriate affect.   Data Reviewed: I have personally reviewed following labs and imaging studies  CBC: Recent Labs  Lab 01/19/22 1135 01/23/22 0333 01/23/22 0743 01/24/22 1600 01/25/22 0455  WBC 9.8 13.1* 11.2* 9.6 8.0  NEUTROABS 6.2 10.3* 8.5* 5.7 4.2  HGB 12.1 12.4 11.9* 11.8* 11.1*  HCT 36.4 39.9 37.7 38.5 35.3*  MCV 91.9 96.4 95.2 99.5 96.2  PLT 167.0 180 186 205 207   Basic Metabolic Panel: Recent Labs  Lab 01/19/22 1135 01/23/22 0333 01/23/22 0743 01/24/22 0445 01/24/22 1600 01/25/22 0455  NA 135 133*  --  141  --  137  K 4.0 4.6  --  3.0*  --  3.6  CL 89* 91*  --  95*  --  92*  CO2 40* 31  --  38*  --  36*  GLUCOSE 251* 539*  --  86  --  206*  BUN 34* 25*  --  31*  --  45*  CREATININE 1.26* 1.08* 1.15* 1.26*  --  1.54*  CALCIUM 9.2 8.9  --  9.0  --  8.5*  MG  --   --  2.0  --  2.1 2.2  PHOS  --   --   --   --  5.2* 5.5*   GFR: Estimated Creatinine Clearance: 34 mL/min  (A) (by C-G formula based on SCr of 1.54 mg/dL (H)). Liver Function Tests: Recent Labs  Lab 01/24/22 1600 01/25/22 0455  AST 31 37  ALT 31 32  ALKPHOS 109 107  BILITOT 0.6 0.6  PROT 6.9 6.8  ALBUMIN 2.9* 2.8*   No results for input(s): "LIPASE", "AMYLASE" in the last 168 hours. No results for input(s): "AMMONIA" in the last 168 hours. Coagulation Profile: No results for input(s): "INR", "PROTIME" in the last 168 hours. Cardiac Enzymes: No results for input(s): "CKTOTAL", "CKMB", "CKMBINDEX", "TROPONINI" in the last 168 hours. BNP (last 3 results) Recent Labs    01/19/22 1135  PROBNP 496.0*   HbA1C: No results for input(s): "HGBA1C" in the last 72 hours. CBG: Recent Labs  Lab 01/24/22 1956 01/24/22 2343 01/25/22 0359 01/25/22 0729 01/25/22 1204  GLUCAP 348* 387* 210* 185* 332*   Lipid Profile: No results for input(s): "CHOL", "HDL", "LDLCALC", "TRIG", "CHOLHDL", "LDLDIRECT" in the last 72 hours. Thyroid Function Tests: Recent Labs    01/23/22 0744  TSH 1.008   Anemia Panel: No results for input(s): "VITAMINB12", "FOLATE", "FERRITIN", "TIBC", "IRON", "RETICCTPCT" in the last 72 hours. Sepsis Labs: No results for input(s): "PROCALCITON", "LATICACIDVEN" in the last 168 hours.  No results found for this or any previous visit (from the past 240 hour(s)).   Radiology Studies: US RENAL  Result Date: 01/25/2022 CLINICAL DATA:  Acute kidney injury. EXAM: RENAL / URINARY TRACT ULTRASOUND COMPLETE COMPARISON:  CT, 04/29/2020. FINDINGS: Right Kidney: Renal measurements: 11.2 x 4.6 x 5.2 cm = volume: 140 mL. Normal parenchymal echogenicity. Cyst arises from the lower pole, 1.1 x 1.3 x 1.4 cm. No other masses, no stones and no hydronephrosis. Left Kidney: Renal measurements: 11.8 x 4.3 x 5.4 cm = volume: 144 mL. Normal parenchymal echogenicity. 2 masses, both consistent with cysts, upper pole measuring 1.7 x 1.5 x 1.7 cm and midpole measuring 1.5 x 1.1 x 1.3 cm. No other  masses, no stones and no hydronephrosis. Bladder: Not visualized. Other: None. IMPRESSION: 1. No acute findings.  No hydronephrosis. 2. Single right and 2 left renal cysts. Electronically Signed   By: Lajean Manes M.D.   On: 01/25/2022 16:45   DG CHEST PORT 1 VIEW  Result Date: 01/25/2022 CLINICAL DATA:  Congestive heart failure. EXAM: PORTABLE CHEST 1 VIEW COMPARISON:  Radiographs 01/24/2022 and 01/23/2022.  CT 11/28/2020. FINDINGS: 0516 hours. Mild patient rotation to the left. Allowing for this, stable cardiomegaly and mediastinal contours. There are persistent bilateral pleural effusions with bibasilar airspace opacities. The pulmonary vascularity is less well-defined, suggesting mild superimposed edema. No pneumothorax. The bones appear unchanged. There is a convex right thoracic scoliosis. IMPRESSION: Compared with yesterday, possible mild superimposed pulmonary edema. The bibasilar airspace opacities and pleural effusions have not significantly changed. Electronically Signed   By: Richardean Sale M.D.   On: 01/25/2022 08:52   DG CHEST PORT 1 VIEW  Result Date: 01/24/2022 CLINICAL DATA:  61 year old female with history of shortness of breath. EXAM: PORTABLE CHEST 1 VIEW COMPARISON:  Chest x-ray 01/23/2022. FINDINGS: Lung volumes are low. Bibasilar opacities may reflect areas of atelectasis and/or consolidation with superimposed moderate bilateral pleural effusions. No pneumothorax. No evidence of pulmonary edema. Heart size appears enlarged. The patient is rotated to the left on today's exam, resulting in distortion of the mediastinal contours and reduced diagnostic  sensitivity and specificity for mediastinal pathology. IMPRESSION: 1. Extensive bibasilar areas of atelectasis and/or consolidation with superimposed moderate bilateral pleural effusions. 2. Cardiomegaly. Electronically Signed   By: Vinnie Langton M.D.   On: 01/24/2022 09:29     Scheduled Meds:  amLODipine  10 mg Oral Daily   aspirin  EC  81 mg Oral Daily   atorvastatin  40 mg Oral QHS   clonazePAM  1 mg Oral BID   divalproex  250 mg Oral q morning   divalproex  500 mg Oral QHS   enoxaparin (LOVENOX) injection  40 mg Subcutaneous Q24H   fluticasone furoate-vilanterol  1 puff Inhalation Daily   insulin aspart  0-15 Units Subcutaneous TID WC   insulin aspart  0-5 Units Subcutaneous QHS   melatonin  10 mg Oral QHS   metoprolol tartrate  50 mg Oral BID   sodium chloride flush  3 mL Intravenous Q12H   umeclidinium bromide  1 puff Inhalation Daily   Continuous Infusions:  sodium chloride      LOS: 2 days   Raiford Noble, DO [facility] Available via Epic secure chat 7am-7pm After these hours, please refer to coverage provider listed on [email or web address] 01/25/2022, 5:30 PM

## 2022-01-25 NOTE — Evaluation (Signed)
Physical Therapy Evaluation Patient Details Name: Adrienne Bennett MRN: [id number] DOB: [birth year] Today's Date: 01/25/2022  History of Present Illness  61 yo female admitted with acute on chronic CHF exac. Hx of COPD, PTSD, bipolar d/o, CHF, DM, diabetic retinopathy  Clinical Impression  On eval, pt was Min guard assist for mobility. She walked ~65 feet x 2 with a RW.  Pt was 98% on 5.5L at rest. She walked x 1 on 6L-sats 95%; walked x 1 on 4L-sats 94%-made RN aware. Will plan to progress activity as tolerated. Continuing to assess RW needs (standard vs rollator). Do not anticipate any f/u PT needs after discharge at this time.      Recommendations for follow up therapy are one component of a multi-disciplinary discharge planning process, led by the attending physician.  Recommendations may be updated based on patient status, additional functional criteria and insurance authorization.  Follow Up Recommendations No PT follow up      Assistance Recommended at Discharge PRN  Patient can return home with the following  Assist for transportation;Help with stairs or ramp for entrance    Equipment Recommendations  (continuing to assess standard RW vs 4 wheeled rollator)  Recommendations for Other Services       Functional Status Assessment Patient has had a recent decline in their functional status and demonstrates the ability to make significant improvements in function in a reasonable and predictable amount of time.     Precautions / Restrictions Precautions Precautions: Fall Precaution Comments: monitor O2 Restrictions Weight Bearing Restrictions: No      Mobility  Bed Mobility               General bed mobility comments: up in chair    Transfers Overall transfer level: Needs assistance Equipment used: Rolling walker (2 wheels) Transfers: Sit to/from Stand Sit to Stand: Supervision                Ambulation/Gait Ambulation/Gait assistance: Min guard Gait  Distance (Feet): 65 Feet Assistive device: Rolling walker (2 wheels) Gait Pattern/deviations: Step-through pattern, Decreased stride length       General Gait Details: Walked x 2-1st time on 6L- sat 95%. After seated rest, walked a 2nd time on 4L-sat 94%.  Stairs            Wheelchair Mobility    Modified Rankin (Stroke Patients Only)       Balance Overall balance assessment: Needs assistance         Standing balance support: Bilateral upper extremity supported, Reliant on assistive device for balance Standing balance-Leahy Scale: Poor                               Pertinent Vitals/Pain Pain Assessment Pain Assessment: No/denies pain    Home Living Family/patient expects to be discharged to:: Private residence Living Arrangements: Spouse/significant other Available Help at Discharge: Family;Available PRN/intermittently Type of Home: House Home Access: Stairs to enter Entrance Stairs-Rails: Can reach both Entrance Stairs-Number of Steps: 7 Alternate Level Stairs-Number of Steps: 13 Home Layout: Two level Home Equipment: Shower seat - built in Additional Comments: significant other works. gave quad cane to family, planning to get another one. Pt likes to sew and read.    Prior Function Prior Level of Function : Independent/Modified Independent;Driving             Mobility Comments: cane PRN, when feeling SOB/LEs weak.  Hand Dominance        Extremity/Trunk Assessment   Upper Extremity Assessment Upper Extremity Assessment: Overall WFL for tasks assessed    Lower Extremity Assessment Lower Extremity Assessment: Generalized weakness    Cervical / Trunk Assessment Cervical / Trunk Assessment: Normal  Communication   Communication: No difficulties  Cognition Arousal/Alertness: Awake/alert Behavior During Therapy: WFL for tasks assessed/performed Overall Cognitive Status: Within Functional Limits for tasks assessed                                           General Comments      Exercises     Assessment/Plan    PT Assessment Patient needs continued PT services  PT Problem List Decreased strength;Decreased activity tolerance;Decreased balance;Decreased mobility;Cardiopulmonary status limiting activity       PT Treatment Interventions DME instruction;Gait training;Stair training;Functional mobility training;Therapeutic activities;Therapeutic exercise;Balance training;Patient/family education    PT Goals (Current goals can be found in the Care Plan section)  Acute Rehab PT Goals Patient Stated Goal: to get better and get home PT Goal Formulation: With patient Time For Goal Achievement: 02/08/22 Potential to Achieve Goals: Good    Frequency Min 3X/week     Co-evaluation               AM-PAC PT "6 Clicks" Mobility  Outcome Measure Help needed turning from your back to your side while in a flat bed without using bedrails?: None Help needed moving from lying on your back to sitting on the side of a flat bed without using bedrails?: None Help needed moving to and from a bed to a chair (including a wheelchair)?: A Little Help needed standing up from a chair using your arms (e.g., wheelchair or bedside chair)?: A Little Help needed to walk in hospital room?: A Little Help needed climbing 3-5 steps with a railing? : A Little 6 Click Score: 20    End of Session Equipment Utilized During Treatment: Oxygen Activity Tolerance: Patient tolerated treatment well Patient left: in chair;with call bell/phone within reach;with family/visitor present   PT Visit Diagnosis: Unsteadiness on feet (R26.81);Muscle weakness (generalized) (M62.81)    Time: 2130-8657 PT Time Calculation (min) (ACUTE ONLY): 26 min   Charges:   PT Evaluation $PT Eval Moderate Complexity: 1 Mod PT Treatments $Gait Training: 8-22 mins           Faye Ramsay, PT Acute Rehabilitation  Office:  [phone] Pager: [phone]

## 2022-01-25 NOTE — Progress Notes (Signed)
OT Cancellation Note  Patient Details Name: Adrienne Bennett MRN: [id number] DOB: Sep 30, 1960   Cancelled Treatment:    Reason Eval/Treat Not Completed: OT screened, no needs identified, will sign off. Only limited by decreased saturations but can perform ADLs and functional mobility. Will defer to PT to address activity tolerance.  Shigeo Baugh L Minola Guin 01/25/2022, 12:56 PM

## 2022-01-25 NOTE — Plan of Care (Signed)
  Problem: Health Behavior/Discharge Planning: Goal: Ability to manage health-related needs will improve Outcome: Progressing   Problem: Clinical Measurements: Goal: Diagnostic test results will improve Outcome: Progressing   Problem: Activity: Goal: Risk for activity intolerance will decrease Outcome: Progressing   Problem: Safety: Goal: Ability to remain free from injury will improve Outcome: Progressing   

## 2022-01-26 ENCOUNTER — Inpatient Hospital Stay (HOSPITAL_COMMUNITY): Payer: Medicare Other

## 2022-01-26 ENCOUNTER — Ambulatory Visit: Payer: Medicare Other | Admitting: Internal Medicine

## 2022-01-26 DIAGNOSIS — E1122 Type 2 diabetes mellitus with diabetic chronic kidney disease: Secondary | ICD-10-CM | POA: Diagnosis not present

## 2022-01-26 DIAGNOSIS — I5033 Acute on chronic diastolic (congestive) heart failure: Secondary | ICD-10-CM | POA: Diagnosis not present

## 2022-01-26 DIAGNOSIS — N1832 Chronic kidney disease, stage 3b: Secondary | ICD-10-CM | POA: Diagnosis not present

## 2022-01-26 DIAGNOSIS — J9621 Acute and chronic respiratory failure with hypoxia: Secondary | ICD-10-CM | POA: Diagnosis not present

## 2022-01-26 DIAGNOSIS — N1831 Chronic kidney disease, stage 3a: Secondary | ICD-10-CM

## 2022-01-26 DIAGNOSIS — E785 Hyperlipidemia, unspecified: Secondary | ICD-10-CM | POA: Diagnosis not present

## 2022-01-26 LAB — CBC WITH DIFFERENTIAL/PLATELET
Abs Immature Granulocytes: 0.04 10*3/uL (ref 0.00–0.07)
Basophils Absolute: 0 10*3/uL (ref 0.0–0.1)
Basophils Relative: 0 %
Eosinophils Absolute: 0.2 10*3/uL (ref 0.0–0.5)
Eosinophils Relative: 2 %
HCT: 35.2 % — ABNORMAL LOW (ref 36.0–46.0)
Hemoglobin: 11 g/dL — ABNORMAL LOW (ref 12.0–15.0)
Immature Granulocytes: 0 %
Lymphocytes Relative: 29 %
Lymphs Abs: 2.8 10*3/uL (ref 0.7–4.0)
MCH: 30.2 pg (ref 26.0–34.0)
MCHC: 31.3 g/dL (ref 30.0–36.0)
MCV: 96.7 fL (ref 80.0–100.0)
Monocytes Absolute: 1.2 10*3/uL — ABNORMAL HIGH (ref 0.1–1.0)
Monocytes Relative: 12 %
Neutro Abs: 5.5 10*3/uL (ref 1.7–7.7)
Neutrophils Relative %: 57 %
Platelets: 241 10*3/uL (ref 150–400)
RBC: 3.64 MIL/uL — ABNORMAL LOW (ref 3.87–5.11)
RDW: 14.2 % (ref 11.5–15.5)
WBC: 9.8 10*3/uL (ref 4.0–10.5)
nRBC: 0 % (ref 0.0–0.2)

## 2022-01-26 LAB — GLUCOSE, CAPILLARY
Glucose-Capillary: 188 mg/dL — ABNORMAL HIGH (ref 70–99)
Glucose-Capillary: 197 mg/dL — ABNORMAL HIGH (ref 70–99)
Glucose-Capillary: 199 mg/dL — ABNORMAL HIGH (ref 70–99)
Glucose-Capillary: 234 mg/dL — ABNORMAL HIGH (ref 70–99)
Glucose-Capillary: 246 mg/dL — ABNORMAL HIGH (ref 70–99)
Glucose-Capillary: 255 mg/dL — ABNORMAL HIGH (ref 70–99)
Glucose-Capillary: 265 mg/dL — ABNORMAL HIGH (ref 70–99)

## 2022-01-26 LAB — COMPREHENSIVE METABOLIC PANEL
ALT: 31 U/L (ref 0–44)
AST: 37 U/L (ref 15–41)
Albumin: 3 g/dL — ABNORMAL LOW (ref 3.5–5.0)
Alkaline Phosphatase: 106 U/L (ref 38–126)
Anion gap: 7 (ref 5–15)
BUN: 46 mg/dL — ABNORMAL HIGH (ref 8–23)
CO2: 33 mmol/L — ABNORMAL HIGH (ref 22–32)
Calcium: 8.5 mg/dL — ABNORMAL LOW (ref 8.9–10.3)
Chloride: 95 mmol/L — ABNORMAL LOW (ref 98–111)
Creatinine, Ser: 1.16 mg/dL — ABNORMAL HIGH (ref 0.44–1.00)
GFR, Estimated: 54 mL/min — ABNORMAL LOW (ref >60)
Glucose, Bld: 191 mg/dL — ABNORMAL HIGH (ref 70–99)
Potassium: 4.1 mmol/L (ref 3.5–5.1)
Sodium: 135 mmol/L (ref 135–145)
Total Bilirubin: 0.5 mg/dL (ref 0.3–1.2)
Total Protein: 7 g/dL (ref 6.5–8.1)

## 2022-01-26 LAB — OSMOLALITY, URINE: Osmolality, Ur: 418 mOsm/kg (ref 300–900)

## 2022-01-26 LAB — PHOSPHORUS: Phosphorus: 4.7 mg/dL — ABNORMAL HIGH (ref 2.5–4.6)

## 2022-01-26 LAB — MAGNESIUM: Magnesium: 2.3 mg/dL (ref 1.7–2.4)

## 2022-01-26 MED ORDER — FUROSEMIDE 10 MG/ML IJ SOLN
40.0000 mg | Freq: Two times a day (BID) | INTRAMUSCULAR | Status: DC
  Administered 2022-01-26 – 2022-01-27 (×4): 40 mg via INTRAVENOUS
  Filled 2022-01-26 (×4): qty 4

## 2022-01-26 MED ORDER — INSULIN GLARGINE-YFGN 100 UNIT/ML ~~LOC~~ SOLN
10.0000 [IU] | Freq: Every day | SUBCUTANEOUS | Status: DC
  Administered 2022-01-26 – 2022-01-28 (×3): 10 [IU] via SUBCUTANEOUS
  Filled 2022-01-26 (×3): qty 0.1

## 2022-01-26 NOTE — TOC Initial Note (Signed)
Transition of Care [facility]) - Initial/Assessment Note    Patient Details  Name: Adrienne Bennett MRN: [id number] Date of Birth: May 28, 1961  [facility]) CM/SW Contact:    Golda Acre, RN Phone Number: 01/26/2022, 9:05 AM  Clinical Narrative:                 On o2 at home at [facility] per min.  Is on 5l at this time.  Will follow for toc needs.  Plan is to return home.  Expected Discharge Plan: Home/Self Care Barriers to Discharge: Continued Medical Work up   Patient Goals and CMS Choice Patient states their goals for this hospitalization and ongoing recovery are:: to go home CMS Medicare.gov Compare Post Acute Care list provided to:: Patient    Expected Discharge Plan and Services Expected Discharge Plan: Home/Self Care   Discharge Planning Services: CM Consult   Living arrangements for the past 2 months: Single Family Home                                      Prior Living Arrangements/Services Living arrangements for the past 2 months: Single Family Home Lives with:: Spouse Patient language and need for interpreter reviewed:: Yes Do you feel safe going back to the place where you live?: Yes            Criminal Activity/Legal Involvement Pertinent to Current Situation/Hospitalization: No - Comment as needed  Activities of Daily Living Home Assistive Devices/Equipment: Eyeglasses, Oxygen, Walker (specify type) ADL Screening (condition at time of admission) Patient's cognitive ability adequate to safely complete daily activities?: Yes Is the patient deaf or have difficulty hearing?: No Does the patient have difficulty seeing, even when wearing glasses/contacts?: No Does the patient have difficulty concentrating, remembering, or making decisions?: No Patient able to express need for assistance with ADLs?: Yes Does the patient have difficulty dressing or bathing?: No Independently performs ADLs?: Yes (appropriate for developmental age) Does the  patient have difficulty walking or climbing stairs?: Yes (sob) Weakness of Legs: Both Weakness of Arms/Hands: Both  Permission Sought/Granted                  Emotional Assessment Appearance:: Appears stated age     Orientation: : Oriented to Place, Oriented to Self, Oriented to  Time, Oriented to Situation Alcohol / Substance Use: Not Applicable Psych Involvement: No (comment)  Admission diagnosis:  Acute respiratory failure with hypercapnia (HCC) [J96.02] Acute on chronic diastolic CHF (congestive heart failure) (HCC) [I50.33] Patient Active Problem List   Diagnosis Date Noted   Acute on chronic diastolic CHF (congestive heart failure) (HCC) 01/23/2022   Acute on chronic respiratory failure with hypoxia and hypercapnia (HCC) 01/23/2022   CKD (chronic kidney disease) stage 3, GFR 30-59 ml/min (HCC) 01/23/2022   Acute respiratory failure with hypoxia (HCC) 01/10/2022   AKI (acute kidney injury) (HCC) 01/09/2022   Acute exacerbation of chronic obstructive pulmonary disease (COPD) (HCC) 01/08/2022   Dizziness 04/08/2021   Combined forms of age-related cataract of left eye 04/01/2021   High cholesterol    Congestive heart failure (HCC) 12/03/2020   Osteoarthritis    IBS (irritable bowel syndrome)    Hypertension    Depression    Macular degeneration, dry 02/01/2020   Diabetic retinopathy (HCC) 03/31/2018   Subclinical hyperthyroidism 02/26/2018   GERD (gastroesophageal reflux disease) 05/23/2017   Mouth sores 04/15/2017   DJD (degenerative  joint disease) 03/04/2016   PCP NOTES >>>>>>>>>> 12/02/2015   Thyromegaly 12/04/2014   Hyperlipidemia 10/12/2012   Bipolar affective disorder (HCC)    Annual physical exam 07/13/2012   Neck pain 02/03/2011   Essential hypertension 09/04/2010   GANGLION CYST 07/22/2010   MIGRAINE HEADACHE 03/13/2010   DM (diabetes mellitus), secondary uncontrolled (HCC) 07/16/2008   IBS ? 07/16/2008   Type II diabetes mellitus (HCC) 2007   PCP:   [name], MD Pharmacy:   [facility] [address] [place], [place] - [address] [place] [address] Phone: [phone] Fax: [phone]  [facility] ([facility] ) - [place], [place] - [address] [place] [address] Phone: [phone] Fax: [phone]     Social Determinants of Health (SDOH) Interventions    Readmission Risk Interventions     No data to display

## 2022-01-26 NOTE — Progress Notes (Signed)
PROGRESS NOTE    Adrienne Bennett  ENI:[id number] DOB: [birth year] DOA: 01/23/2022 PCP: [name], MD   Brief Narrative:  The patient is a 61 year old overweight Caucasian female with a past medical history significant for but limited to COPD, PTSD/bipolar disorder, chronic diastolic CHF, diabetes mellitus type 2 with diabetic retinopathy, hypertension, hyperlipidemia as well as other comorbidities who presented with respiratory distress.  She was recently discharged from the hospital after a COPD exacerbation and was at home and noticed worsening dyspnea over the last few days.  She normally wears 2 L of oxygen but she is desaturating to 80% on 3 L.  She had increased peripheral and central edema over the last several days and along with weight gain and ended up having to be placed on BiPAP.  She is started on diuresis and started improving.  She is weaned off of the BiPAP and admitted for further evaluation management for acute on chronic respiratory failure in setting of acute on chronic diastolic CHF   **Interim History   She is diuresing well and improving slowly. Remains on 5 Liters of Supplemental O2 when she has a baseline of 2 Liters.  We continued IV diuresis yesterday but held today given her renal function slightly bumped.  We will need cardiology further evaluation recommendations and will message cardiology for consultation in the morning.  Renal function has bumped up case was discussed with nephrology and they do not feel it is too significant recommending to continue monitor closely.  In the interim we have done a basic renal work-up with urine lites, urinalysis and renal ultrasound.  Renal function improved again so we will resume diuresis.  Cardiology was consulted and recommending continuing diuresis.  They feel that her renal function decrease further without resolution of her dyspnea and hypoxemia the patient should undergo a right heart cath as well as possibly a left heart cath.    Assessment and Plan:  Acute on chronic hypoxemic and hypercarbic respiratory failure in the setting of acute on chronic Diastolic CHF CHF: -Patient presented with shortness of breath, bilateral leg swelling.  Last echo on 5/22 shows preserved ejection fraction with grade 2 diastolic dysfunction and aortic sclerosis without stenosis -BNP 461 on admission and trended down to 185.9.  Initial Chest x-ray concerning for bilateral pleural effusion.   -Patient was on BiPAP and now weaned to 5 L.  Will admit patient on the stepdown unit and changed to progressive -Continued Lasix IV 40 mg twice daily however renal function worsened so diuresis was held today -Repeat echocardiogram shows no evidence of systolic or diastolic CHF now. -SpO2: 98 % O2 Flow Rate (L/min): 5.5 L/min FiO2 (%): 35 % -She will need continued supplemental oxygen via nasal cannula and wean O2 as tolerated -Continuous pulse oximetry maintain O2 saturation greater 90% -Strict INO's and daily weight.  Monitor kidney function.  Check electrolytes. -She -960 mL since admission -Continue monitor for signs and symptoms of volume overload -Chest x-ray yesterday a.m. showed "Extensive bibasilar areas of atelectasis and/or consolidation with superimposed moderate bilateral pleural effusions. Cardiomegaly." -Chest x-ray today showed "Compared with yesterday, possible mild superimposed pulmonary edema. The bibasilar airspace opacities and pleural effusions have not significantly changed." -We will add flutter valve and incentive spirometry -Repeat chest x-ray in the a.m. and will need ambulatory home O2 screen prior to discharge; since Lasix has been held we will call cardiology in the morning for further evaluation recommendations; case was discussed with nephrology who feels that her  creatinine is not as significant for them to get involved so they recommend getting cardiology to further evaluate -Cardiology evaluated and chest x-ray done  showed not much improvement as her O2 requirements are still higher than normal. -Cardiology believes that she will need more Lasix plus or minus metolazone and recommending continuing diuresis given her significant volume overload  Chest Pain -Resolved and she was having at the time of admission -States improved after she was diuresed and breathing improved -Cardiac enzymes were negative and echo showed normal wall motion with no abnormalities noted -Cardiology following and recommending continuing diuretics and recommending possible right heart cath if she continues to have further renal function deterioration without resolution of her dyspnea or hypoxemia -Cardiology to decide on progression tomorrow and likely have right heart cath performed on January 28, 2022 and if they do proceed with a right heart cath and depending on renal parameters they may also do a left heart cath to her chest pain complaint on admission   Type 2 diabetes on long-term insulin with uncontrolled hyperglycemia and diabetic retinopathy: -Last A1c 8.5%.  Blood sugar elevated upon arrival. -Hold metformin.  Start on sliding scale insulin.  Monitor blood sugar closely and adjust insulin regimen as necessary -CBGs ranging from 188-234 -Diabetes education coordinator evaluated and based on recommendations we will add 10 units of Semglee nightly   COPD with chronic hypoxic respiratory failure on 2 L of supplemental oxygen at home -Continue home inhalers with Breo Ellipta and Incruse Ellipta and also has DuoNeb 3 MLS 3 times daily as needed for wheezing or shortness of breath -We will add guaifenesin 1200 g p.o. twice daily, flutter valve and incentive spirometry -Unfortunately SARS-CoV-2 and influenz A and B panel still not done for unclear reasons   AKI on CKD stage IIIb -Patient's BUNs/creatinine worsened in the setting of diuresis slightly and went from 25/1.08 and is now 31/1.26 yesterday and further worsened to 45/1.54 so  diuresis was held but now resumed.  Patient's BUNs/creatinine is now 46/1.16 -Avoid further nephrotoxic medications and contrast dyes if possible, hypotension and dehydration to ensure adequate renal perfusion and will need to renally adjust medications -Renal ultrasound obtained and urinalysis done and showed clear urine with straw-colored greater than 500 glucose, small leukocytes, negative nitrites and no bacteria seen with 11-20 WBCs; urine creatinine 34.84 and urine sodium was 14 -Renal ultrasound done and showed "No acute findings.  No hydronephrosis. Single right and 2 left renal cysts." -Continue monitor and trend renal function carefully repeat CMP in a.m.   Hypertension -Blood pressure elevated on arrival.  Now stable. -Resume home medications amlodipine and metoprolol -Continue monitor blood pressures per protocol -Last blood pressure reading was 123/95   History of bipolar disorder/PTSD/depression -At baseline -Continue home meds Depakote, Klonopin, melatonin   Hyperlipidemia -Continue Atorvastatin 40 mg p.o. nightly   GERD/GI prophylaxis -Not on PPI but will add if necessary   Leukocytosis -Likely secondary to recent steroid use from her recent COPD exacerbation.  Patient is afebrile.   -Patient's WBC went from 9.6 and trended down to 8.0 now 9.8 -Continue monitor for signs and symptoms of infection and repeat CBC tomorrow AM.   Hypokalemia -In the setting of diuresis -Check mag level in the a.m. -Patient's potassium is now improved to 4.1 -Continue monitor replete as necessary  Normocytic Anemia -Patient's hemoglobin/hematocrit is now 1.1/35.3 and now is 11.0/35.2 -Check Anemia Panel in the AM -Continue to monitor for signs and symptoms of bleeding; no overt bleeding noted -  Repeat CBC in a.m.  DVT prophylaxis: enoxaparin (LOVENOX) injection 40 mg Start: 01/23/22 0800 SCDs Start: 01/23/22 0743    Code Status: Full Code Family Communication: No family currently  at bedside  Disposition Plan:  Level of care: Progressive Status is: Inpatient Remains inpatient appropriate because: Needs further cardiac evaluation and clearance prior to safe discharge disposition as they may recommend a right heart cath   Consultants:  Cardiology Case was discussed with nephrology  Procedures:  Echocardiogram IMPRESSIONS     1. Left ventricular ejection fraction, by estimation, is 60 to 65%. The  left ventricle has normal function. The left ventricle has no regional  wall motion abnormalities. There is mild left ventricular hypertrophy.  Left ventricular diastolic parameters  were normal.   2. Right ventricular systolic function is normal. The right ventricular  size is normal.   3. Small mixed echo luscent area posterior to the AV groove may represent  a benign pericardial cyst can consider CT scan to further evaluate but not  likely clinically necessary.   4. The mitral valve is abnormal. Trivial mitral valve regurgitation. No  evidence of mitral stenosis.   5. Very mild stenosis by valve area mean gradient only 8 and DVI 0.68.  The aortic valve is normal in structure. There is moderate calcification  of the aortic valve. There is moderate thickening of the aortic valve.  Aortic valve regurgitation is mild.  Mild aortic valve stenosis.   6. The inferior vena cava is normal in size with greater than 50%  respiratory variability, suggesting right atrial pressure of 3 mmHg.   FINDINGS   Left Ventricle: Left ventricular ejection fraction, by estimation, is 60  to 65%. The left ventricle has normal function. The left ventricle has no  regional wall motion abnormalities. Definity contrast agent was given IV  to delineate the left ventricular   endocardial borders. The left ventricular internal cavity size was normal  in size. There is mild left ventricular hypertrophy. Left ventricular  diastolic parameters were normal.   Right Ventricle: The right  ventricular size is normal. No increase in  right ventricular wall thickness. Right ventricular systolic function is  normal.   Left Atrium: Left atrial size was normal in size.   Right Atrium: Right atrial size was normal in size.   Pericardium: Small mixed echo luscent area posterior to the AV groove may  represent a benign pericardial cyst can consider CT scan to further  evaluate but not likely clinically necessary. There is no evidence of  pericardial effusion.   Mitral Valve: The mitral valve is abnormal. There is mild thickening of  the mitral valve leaflet(s). There is mild calcification of the mitral  valve leaflet(s). Trivial mitral valve regurgitation. No evidence of  mitral valve stenosis.   Tricuspid Valve: The tricuspid valve is normal in structure. Tricuspid  valve regurgitation is not demonstrated. No evidence of tricuspid  stenosis.   Aortic Valve: Very mild stenosis by valve area mean gradient only 8 and  DVI 0.68. The aortic valve is normal in structure. There is moderate  calcification of the aortic valve. There is moderate thickening of the  aortic valve. Aortic valve regurgitation  is mild. Aortic regurgitation PHT measures 589 msec. Mild aortic stenosis  is present. Aortic valve mean gradient measures 8.0 mmHg. Aortic valve  peak gradient measures 14.5 mmHg. Aortic valve area, by VTI measures 1.92  cm.   Pulmonic Valve: The pulmonic valve was normal in structure. Pulmonic valve  regurgitation  is mild. No evidence of pulmonic stenosis.   Aorta: The aortic root is normal in size and structure.   Venous: The inferior vena cava is normal in size with greater than 50%  respiratory variability, suggesting right atrial pressure of 3 mmHg.   IAS/Shunts: No atrial level shunt detected by color flow Doppler.      LEFT VENTRICLE  PLAX 2D  LVIDd:         4.15 cm     Diastology  LVIDs:         2.90 cm     LV e' medial:    6.74 cm/s  LV PW:         1.10 cm      LV E/e' medial:  17.1  LV IVS:        1.20 cm     LV e' lateral:   9.14 cm/s  LVOT diam:     1.90 cm     LV E/e' lateral: 12.6  LV SV:         71  LV SV Index:   42  LVOT Area:     2.84 cm     LV Volumes (MOD)  LV vol d, MOD A2C: 93.3 ml  LV vol d, MOD A4C: 91.7 ml  LV vol s, MOD A2C: 20.6 ml  LV vol s, MOD A4C: 29.0 ml  LV SV MOD A2C:     72.7 ml  LV SV MOD A4C:     91.7 ml  LV SV MOD BP:      67.2 ml   RIGHT VENTRICLE          IVC  RV Basal diam:  3.20 cm  IVC diam: 1.70 cm  RV Mid diam:    2.20 cm   LEFT ATRIUM             Index        RIGHT ATRIUM           Index  LA diam:        3.50 cm 2.06 cm/m   RA Area:     10.10 cm  LA Vol (A2C):   44.1 ml 25.92 ml/m  RA Volume:   16.20 ml  9.52 ml/m  LA Vol (A4C):   26.2 ml 15.40 ml/m  LA Biplane Vol: 35.5 ml 20.87 ml/m   AORTIC VALVE                     PULMONIC VALVE  AV Area (Vmax):    1.91 cm      PV Vmax:          1.09 m/s  AV Area (Vmean):   1.88 cm      PV Peak grad:     4.8 mmHg  AV Area (VTI):     1.92 cm      PR End Diast Vel: 6.04 msec  AV Vmax:           190.50 cm/s  AV Vmean:          128.500 cm/s  AV VTI:            0.368 m  AV Peak Grad:      14.5 mmHg  AV Mean Grad:      8.0 mmHg  LVOT Vmax:         128.50 cm/s  LVOT Vmean:        85.050 cm/s  LVOT VTI:  0.250 m  LVOT/AV VTI ratio: 0.68  AI PHT:            589 msec     AORTA  Ao Root diam: 3.20 cm  Ao Asc diam:  3.20 cm   MITRAL VALVE                TRICUSPID VALVE  MV Area (PHT): 4.71 cm     TR Peak grad:   26.0 mmHg  MV Decel Time: 161 msec     TR Vmax:        255.00 cm/s  MV E velocity: 115.00 cm/s  MV A velocity: 99.80 cm/s   SHUNTS  MV E/A ratio:  1.15         Systemic VTI:  0.25 m                              Systemic Diam: 1.90 cm    Renal ultrasound  Antimicrobials:  Anti-infectives (From admission, onward)    None       Subjective: Seen and examined at bedside and she still felt short of breath.  States that she is  feeling little bit better but does not feel that she is back to her baseline and is little frustrated with her slow progression.  Denies any lightheadedness or dizziness.  Denies any chest pain now.  No other concerns or complaints at this time.  Objective: Vitals:   01/26/22 0615 01/26/22 0831 01/26/22 0903 01/26/22 1255  BP: (!) 111/55  140/82 (!) 123/95  Pulse: 66  69 65  Resp: 19   14  Temp: 98.2 F (36.8 C)   98.4 F (36.9 C)  TempSrc: Oral   Oral  SpO2: 94% 96%  95%  Weight:      Height:        Intake/Output Summary (Last 24 hours) at 01/26/2022 1645 Last data filed at 01/26/2022 1252 Gross per 24 hour  Intake 820 ml  Output 2130 ml  Net -1310 ml   Filed Weights   01/24/22 1528 01/25/22 0406 01/26/22 0438  Weight: 68.3 kg 68.7 kg 67.2 kg   Examination: Physical Exam:  Constitutional: WN/WD overweight Caucasian female currently no acute distress sitting in a chair eating her meal Respiratory: Diminished to auscultation bilaterally with coarse breath sounds and has some crackles, no wheezing, rales, rhonchi. Normal respiratory effort and patient is not tachypenic. No accessory muscle use.  Unlabored breathing is wearing 5-1/2 L still Cardiovascular: RRR, no murmurs / rubs / gallops. S1 and S2 auscultated.  Trace lower extremity edema Abdomen: Soft, non-tender, distended secondary body habitus bowel sounds positive.  GU: Deferred. Musculoskeletal: No clubbing / cyanosis of digits/nails. No joint deformity upper and lower extremities.  Skin: No rashes, lesions, ulcers on limited skin evaluation. No induration; Warm and dry.  Neurologic: CN 2-12 grossly intact with no focal deficits. Romberg sign and cerebellar reflexes not assessed.  Psychiatric: Normal judgment and insight. Alert and oriented x 3. Normal mood and appropriate affect.   Data Reviewed: I have personally reviewed following labs and imaging studies  CBC: Recent Labs  Lab 01/23/22 0333 01/23/22 0743  01/24/22 1600 01/25/22 0455 01/26/22 0403  WBC 13.1* 11.2* 9.6 8.0 9.8  NEUTROABS 10.3* 8.5* 5.7 4.2 5.5  HGB 12.4 11.9* 11.8* 11.1* 11.0*  HCT 39.9 37.7 38.5 35.3* 35.2*  MCV 96.4 95.2 99.5 96.2 96.7  PLT 180 186 205 207 241   Basic  Metabolic Panel: Recent Labs  Lab 01/23/22 0333 01/23/22 0743 01/24/22 0445 01/24/22 1600 01/25/22 0455 01/26/22 0403  NA 133*  --  141  --  137 135  K 4.6  --  3.0*  --  3.6 4.1  CL 91*  --  95*  --  92* 95*  CO2 31  --  38*  --  36* 33*  GLUCOSE 539*  --  86  --  206* 191*  BUN 25*  --  31*  --  45* 46*  CREATININE 1.08* 1.15* 1.26*  --  1.54* 1.16*  CALCIUM 8.9  --  9.0  --  8.5* 8.5*  MG  --  2.0  --  2.1 2.2 2.3  PHOS  --   --   --  5.2* 5.5* 4.7*   GFR: Estimated Creatinine Clearance: 44.7 mL/min (A) (by C-G formula based on SCr of 1.16 mg/dL (H)). Liver Function Tests: Recent Labs  Lab 01/24/22 1600 01/25/22 0455 01/26/22 0403  AST 31 37 37  ALT 31 32 31  ALKPHOS 109 107 106  BILITOT 0.6 0.6 0.5  PROT 6.9 6.8 7.0  ALBUMIN 2.9* 2.8* 3.0*   No results for input(s): "LIPASE", "AMYLASE" in the last 168 hours. No results for input(s): "AMMONIA" in the last 168 hours. Coagulation Profile: No results for input(s): "INR", "PROTIME" in the last 168 hours. Cardiac Enzymes: No results for input(s): "CKTOTAL", "CKMB", "CKMBINDEX", "TROPONINI" in the last 168 hours. BNP (last 3 results) Recent Labs    01/19/22 1135  PROBNP 496.0*   HbA1C: No results for input(s): "HGBA1C" in the last 72 hours. CBG: Recent Labs  Lab 01/26/22 0028 01/26/22 0429 01/26/22 0736 01/26/22 1125 01/26/22 1629  GLUCAP 199* 188* 197* 246* 234*   Lipid Profile: No results for input(s): "CHOL", "HDL", "LDLCALC", "TRIG", "CHOLHDL", "LDLDIRECT" in the last 72 hours. Thyroid Function Tests: No results for input(s): "TSH", "T4TOTAL", "FREET4", "T3FREE", "THYROIDAB" in the last 72 hours. Anemia Panel: No results for input(s): "VITAMINB12", "FOLATE",  "FERRITIN", "TIBC", "IRON", "RETICCTPCT" in the last 72 hours. Sepsis Labs: No results for input(s): "PROCALCITON", "LATICACIDVEN" in the last 168 hours.  No results found for this or any previous visit (from the past 240 hour(s)).   Radiology Studies: DG CHEST PORT 1 VIEW  Result Date: 01/26/2022 CLINICAL DATA:  CHF EXAM: PORTABLE CHEST 1 VIEW COMPARISON:  Chest radiograph from one day prior. FINDINGS: Left rotated chest radiograph stable cardiomediastinal silhouette with mild cardiomegaly. No pneumothorax. Stable small bilateral pleural effusions, left greater than right. Mild-to-moderate pulmonary edema common similar. Stable hazy bibasilar lung opacities, favor atelectasis. IMPRESSION: Stable mild-to-moderate congestive heart failure with small bilateral pleural effusions, left greater than right. Electronically Signed   By: Delbert Phenix M.D.   On: 01/26/2022 08:28   US RENAL  Result Date: 01/25/2022 CLINICAL DATA:  Acute kidney injury. EXAM: RENAL / URINARY TRACT ULTRASOUND COMPLETE COMPARISON:  CT, 04/29/2020. FINDINGS: Right Kidney: Renal measurements: 11.2 x 4.6 x 5.2 cm = volume: 140 mL. Normal parenchymal echogenicity. Cyst arises from the lower pole, 1.1 x 1.3 x 1.4 cm. No other masses, no stones and no hydronephrosis. Left Kidney: Renal measurements: 11.8 x 4.3 x 5.4 cm = volume: 144 mL. Normal parenchymal echogenicity. 2 masses, both consistent with cysts, upper pole measuring 1.7 x 1.5 x 1.7 cm and midpole measuring 1.5 x 1.1 x 1.3 cm. No other masses, no stones and no hydronephrosis. Bladder: Not visualized. Other: None. IMPRESSION: 1. No acute findings.  No hydronephrosis.  2. Single right and 2 left renal cysts. Electronically Signed   By: Lajean Manes M.D.   On: 01/25/2022 16:45   DG CHEST PORT 1 VIEW  Result Date: 01/25/2022 CLINICAL DATA:  Congestive heart failure. EXAM: PORTABLE CHEST 1 VIEW COMPARISON:  Radiographs 01/24/2022 and 01/23/2022.  CT 11/28/2020. FINDINGS: 0516 hours.  Mild patient rotation to the left. Allowing for this, stable cardiomegaly and mediastinal contours. There are persistent bilateral pleural effusions with bibasilar airspace opacities. The pulmonary vascularity is less well-defined, suggesting mild superimposed edema. No pneumothorax. The bones appear unchanged. There is a convex right thoracic scoliosis. IMPRESSION: Compared with yesterday, possible mild superimposed pulmonary edema. The bibasilar airspace opacities and pleural effusions have not significantly changed. Electronically Signed   By: Richardean Sale M.D.   On: 01/25/2022 08:52     Scheduled Meds:  amLODipine  10 mg Oral Daily   aspirin EC  81 mg Oral Daily   atorvastatin  40 mg Oral QHS   clonazePAM  1 mg Oral BID   divalproex  250 mg Oral q morning   divalproex  500 mg Oral QHS   enoxaparin (LOVENOX) injection  40 mg Subcutaneous Q24H   fluticasone furoate-vilanterol  1 puff Inhalation Daily   furosemide  40 mg Intravenous BID   insulin aspart  0-15 Units Subcutaneous TID WC   insulin aspart  0-5 Units Subcutaneous QHS   melatonin  10 mg Oral QHS   metoprolol tartrate  50 mg Oral BID   sodium chloride flush  3 mL Intravenous Q12H   umeclidinium bromide  1 puff Inhalation Daily   Continuous Infusions:  sodium chloride      LOS: 3 days   Raiford Noble, DO [facility] Available via Epic secure chat 7am-7pm After these hours, please refer to coverage provider listed on [email or web address] 01/26/2022, 4:45 PM

## 2022-01-26 NOTE — Progress Notes (Signed)
Inpatient Diabetes Program Recommendations  AACE/ADA: New Consensus Statement on Inpatient Glycemic Control (2015)  Target Ranges:  Prepandial:   less than 140 mg/dL      Peak postprandial:   less than 180 mg/dL (1-2 hours)      Critically ill patients:  140 - 180 mg/dL   Lab Results  Component Value Date   GLUCAP 246 (H) 01/26/2022   HGBA1C 8.5 (H) 01/09/2022    Review of Glycemic Control  Diabetes history: DM2 Outpatient Diabetes medications: Tresiba 60 QHS, metformin 1000 mg BID (on hold for past 2 weeks) Current orders for Inpatient glycemic control: Novolog 0-15 TID with meals and 0-5 HS  HgbA1C - 8.5% 197, 246 this am Received 25 units of Novolog correction yesterday   Inpatient Diabetes Program Recommendations:    Consider adding Semglee 10 units QHS  Continue to follow.  Thank you. Ailene Ards, RD, LDN, CDE Inpatient Diabetes Coordinator [phone]

## 2022-01-26 NOTE — Consult Note (Addendum)
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 discuss giving Lasix 80 mg IV at 8 AM and 4 PM -Her creatinine was 1.08 on admission and increased to 1.54 x 7/2, this is the reason the Lasix was held. -BUN went from 25 up to 45 and is now 46 -Unfortunately, she still has significant volume overload and will have to have continue diuresis  2.  Hypertension -Blood pressure has been stable -She is on amlodipine 10 mg twice daily and Lopressor 50 mg twice daily, both home medications  3.  Possible atrial fibrillation: - The patient and her husband believe that she had some atrial fibrillation while she was in the emergency room - On review of the nursing notes, it was commented that her heart rate appeared elevated due to a tremor, but was not -No atrial fibrillation is noted on any saved strips or EKG  4.  Chest pain - She was having chest pain on admission, said it felt like a quarterback standing on her lungs. -- As she was diuresed and her breathing improved, so did her chest  pain --Cardiac enzymes were negative, EF normal with no wall motion abnormalities on echo -She had a negative Myoview last year, MD advise on further evaluation and its timing    Risk Assessment/Risk Scores:     HEAR Score (for undifferentiated chest pain):  U/A to do  New York Heart Association (NYHA) Functional Class NYHA Class IV      For questions or updates, please contact [facility] Please consult [email or web address] for contact info under    Signed, Rosaria Ferries, PA-C  01/26/2022 8:26 AM  I have seen and examined the patient along with Rosaria Ferries, PA-C .  I have reviewed the chart, notes and new data.  I agree with PA/NP's note.  Key new complaints: Still on O2 at 4-5 L.  A little frustrated with the slow progress. Key examination changes: No peripheral edema.  Unable to see JVD.  Reduced breath sounds in right lung base, no wheezes and no moist rales.  Normal cardiac exam. Key new findings / data: Creatinine increased abruptly to 1.54 yesterday, but after 1 day of holding diuretics is down to 1.16.  Diuretics restarted.  BUN remains elevated at 45-46.  Note persistently elevated phosphorus level.  BNP is substantially lower during this hospitalization, compared to last time.  PLAN: Continue diuretics, but if renal function deteriorates further without resolution of dyspnea and hypoxemia, I think she should undergo right heart catheterization.  Decide this based on clinical progression and laboratory tests tomorrow, can then have it performed on July 5. If we do proceed with heart catheterization, depending on renal parameters, may also do left heart catheterization due to her complaints of chest pain. I wonder if her true kidney function is actually  worse than we estimate, based on the chronically elevated phosphorus levels.  Sanda Klein, MD, [place] [phone] 01/26/2022, 2:45 PM

## 2022-01-26 NOTE — Plan of Care (Signed)
  Problem: Coping: Goal: Ability to adjust to condition or change in health will improve Outcome: Progressing   Problem: Health Behavior/Discharge Planning: Goal: Ability to manage health-related needs will improve Outcome: Progressing   Problem: Metabolic: Goal: Ability to maintain appropriate glucose levels will improve Outcome: Progressing   Problem: Tissue Perfusion: Goal: Adequacy of tissue perfusion will improve Outcome: Progressing   

## 2022-01-27 DIAGNOSIS — J9621 Acute and chronic respiratory failure with hypoxia: Secondary | ICD-10-CM | POA: Diagnosis not present

## 2022-01-27 DIAGNOSIS — E785 Hyperlipidemia, unspecified: Secondary | ICD-10-CM | POA: Diagnosis not present

## 2022-01-27 DIAGNOSIS — I5033 Acute on chronic diastolic (congestive) heart failure: Secondary | ICD-10-CM | POA: Diagnosis not present

## 2022-01-27 DIAGNOSIS — N1832 Chronic kidney disease, stage 3b: Secondary | ICD-10-CM | POA: Diagnosis not present

## 2022-01-27 LAB — GLUCOSE, CAPILLARY
Glucose-Capillary: 178 mg/dL — ABNORMAL HIGH (ref 70–99)
Glucose-Capillary: 217 mg/dL — ABNORMAL HIGH (ref 70–99)
Glucose-Capillary: 288 mg/dL — ABNORMAL HIGH (ref 70–99)
Glucose-Capillary: 317 mg/dL — ABNORMAL HIGH (ref 70–99)
Glucose-Capillary: 337 mg/dL — ABNORMAL HIGH (ref 70–99)

## 2022-01-27 LAB — CBC WITH DIFFERENTIAL/PLATELET
Abs Immature Granulocytes: 0.02 10*3/uL (ref 0.00–0.07)
Basophils Absolute: 0 10*3/uL (ref 0.0–0.1)
Basophils Relative: 0 %
Eosinophils Absolute: 0.1 10*3/uL (ref 0.0–0.5)
Eosinophils Relative: 2 %
HCT: 40 % (ref 36.0–46.0)
Hemoglobin: 12.5 g/dL (ref 12.0–15.0)
Immature Granulocytes: 0 %
Lymphocytes Relative: 28 %
Lymphs Abs: 2.2 10*3/uL (ref 0.7–4.0)
MCH: 29.9 pg (ref 26.0–34.0)
MCHC: 31.3 g/dL (ref 30.0–36.0)
MCV: 95.7 fL (ref 80.0–100.0)
Monocytes Absolute: 1 10*3/uL (ref 0.1–1.0)
Monocytes Relative: 12 %
Neutro Abs: 4.6 10*3/uL (ref 1.7–7.7)
Neutrophils Relative %: 58 %
Platelets: 213 10*3/uL (ref 150–400)
RBC: 4.18 MIL/uL (ref 3.87–5.11)
RDW: 14.1 % (ref 11.5–15.5)
WBC: 8 10*3/uL (ref 4.0–10.5)
nRBC: 0 % (ref 0.0–0.2)

## 2022-01-27 LAB — COMPREHENSIVE METABOLIC PANEL
ALT: 31 U/L (ref 0–44)
AST: 37 U/L (ref 15–41)
Albumin: 3.3 g/dL — ABNORMAL LOW (ref 3.5–5.0)
Alkaline Phosphatase: 130 U/L — ABNORMAL HIGH (ref 38–126)
Anion gap: 13 (ref 5–15)
BUN: 50 mg/dL — ABNORMAL HIGH (ref 8–23)
CO2: 29 mmol/L (ref 22–32)
Calcium: 8.9 mg/dL (ref 8.9–10.3)
Chloride: 92 mmol/L — ABNORMAL LOW (ref 98–111)
Creatinine, Ser: 1.33 mg/dL — ABNORMAL HIGH (ref 0.44–1.00)
GFR, Estimated: 46 mL/min — ABNORMAL LOW (ref >60)
Glucose, Bld: 208 mg/dL — ABNORMAL HIGH (ref 70–99)
Potassium: 4.9 mmol/L (ref 3.5–5.1)
Sodium: 134 mmol/L — ABNORMAL LOW (ref 135–145)
Total Bilirubin: 0.8 mg/dL (ref 0.3–1.2)
Total Protein: 7.7 g/dL (ref 6.5–8.1)

## 2022-01-27 LAB — PHOSPHORUS: Phosphorus: 5 mg/dL — ABNORMAL HIGH (ref 2.5–4.6)

## 2022-01-27 LAB — MAGNESIUM: Magnesium: 2.6 mg/dL — ABNORMAL HIGH (ref 1.7–2.4)

## 2022-01-27 MED ORDER — DAPAGLIFLOZIN PROPANEDIOL 10 MG PO TABS
10.0000 mg | ORAL_TABLET | Freq: Every day | ORAL | Status: DC
  Administered 2022-01-27 – 2022-01-29 (×3): 10 mg via ORAL
  Filled 2022-01-27 (×3): qty 1

## 2022-01-27 MED ORDER — ORAL CARE MOUTH RINSE: 15.0000 mL | OROMUCOSAL | Status: DC | PRN

## 2022-01-27 MED ORDER — SALINE SPRAY 0.65 % NA SOLN
1.0000 | NASAL | Status: DC | PRN
  Administered 2022-01-27 – 2022-01-28 (×2): 1 via NASAL
  Filled 2022-01-27 (×2): qty 44

## 2022-01-27 MED ORDER — AYR SALINE NASAL NA GEL
1.0000 | NASAL | Status: DC | PRN
  Administered 2022-01-27: 1 via NASAL
  Filled 2022-01-27 (×2): qty 14.1

## 2022-01-27 NOTE — Progress Notes (Signed)
Physical Therapy Treatment Patient Details Name: Adrienne Bennett MRN: [id number] DOB: [birth year] Today's Date: 01/27/2022   History of Present Illness 61 yo female admitted with acute on chronic CHF exac. Hx of COPD, PTSD, bipolar d/o, CHF, DM, diabetic retinopathy    PT Comments    O2 90% on 2L, HR 71 bpm during ambulation. Pt reports feeing weak. She has only been ambulating with therapy and she wishes to ambulate more. Recommend daily ambulation in hallway with nursing supervision/assistance to increase activity. Will continue to follow and progress activity as tolerated.    Recommendations for follow up therapy are one component of a multi-disciplinary discharge planning process, led by the attending physician.  Recommendations may be updated based on patient status, additional functional criteria and insurance authorization.  Follow Up Recommendations  Home health PT     Assistance Recommended at Discharge PRN  Patient can return home with the following A little help with walking and/or transfers;A little help with bathing/dressing/bathroom   Equipment Recommendations  Rollator (4 wheels)    Recommendations for Other Services       Precautions / Restrictions Precautions Precautions: Fall Precaution Comments: monitor O2 Restrictions Weight Bearing Restrictions: No     Mobility  Bed Mobility               General bed mobility comments: up in chair    Transfers Overall transfer level: Needs assistance Equipment used: Rollator (4 wheels) Transfers: Sit to/from Stand Sit to Stand: Min guard                Ambulation/Gait Ambulation/Gait assistance: Min guard, Min assist Gait Distance (Feet): 65 Feet (65'x1; 30'x2) Assistive device: Rollator (4 wheels) Gait Pattern/deviations: Step-through pattern, Decreased stride length       General Gait Details: Walked x 3 with seated rest breaks taken as needed. O2 90% on 2L O2, HR 71 bpm. Pt fatigues easily.  She reports LEs feeling weak.   Stairs             Wheelchair Mobility    Modified Rankin (Stroke Patients Only)       Balance Overall balance assessment: Needs assistance         Standing balance support: Bilateral upper extremity supported, Reliant on assistive device for balance, During functional activity Standing balance-Leahy Scale: Poor                              Cognition Arousal/Alertness: Awake/alert Behavior During Therapy: WFL for tasks assessed/performed Overall Cognitive Status: Within Functional Limits for tasks assessed                                          Exercises      General Comments        Pertinent Vitals/Pain Pain Assessment Pain Assessment: No/denies pain    Home Living                          Prior Function            PT Goals (current goals can now be found in the care plan section) Progress towards PT goals: Progressing toward goals    Frequency    Min 3X/week      PT Plan Discharge plan needs to be updated    Co-evaluation  AM-PAC PT "6 Clicks" Mobility   Outcome Measure  Help needed turning from your back to your side while in a flat bed without using bedrails?: A Little Help needed moving from lying on your back to sitting on the side of a flat bed without using bedrails?: A Little Help needed moving to and from a bed to a chair (including a wheelchair)?: A Little Help needed standing up from a chair using your arms (e.g., wheelchair or bedside chair)?: A Little Help needed to walk in hospital room?: A Little Help needed climbing 3-5 steps with a railing? : A Little 6 Click Score: 18    End of Session Equipment Utilized During Treatment: Oxygen;Gait belt Activity Tolerance: Patient limited by fatigue Patient left: in chair;with call bell/phone within reach;with family/visitor present   PT Visit Diagnosis: Unsteadiness on feet (R26.81);Muscle  weakness (generalized) (M62.81)     Time: 4540-9811 PT Time Calculation (min) (ACUTE ONLY): 37 min  Charges:  $Gait Training: 23-37 mins                        Faye Ramsay, PT Acute Rehabilitation  Office: [phone] Pager: [phone]

## 2022-01-27 NOTE — Progress Notes (Signed)
Pt had two mild nosebleeds during night shift.

## 2022-01-27 NOTE — Plan of Care (Signed)
  Problem: Education: Goal: Ability to describe self-care measures that may prevent or decrease complications (Diabetes Survival Skills Education) will improve Outcome: Progressing   Problem: Fluid Volume: Goal: Ability to maintain a balanced intake and output will improve Outcome: Progressing   Problem: Clinical Measurements: Goal: Diagnostic test results will improve Outcome: Progressing Goal: Respiratory complications will improve Outcome: Progressing   Problem: Activity: Goal: Risk for activity intolerance will decrease Outcome: Progressing

## 2022-01-27 NOTE — Progress Notes (Signed)
Physical Therapy Treatment Patient Details Name: Adrienne Bennett MRN: [id number] DOB: [birth year] Today's Date: 01/27/2022   History of Present Illness 61 yo female admitted with acute on chronic CHF exac. Hx of COPD, PTSD, bipolar d/o, CHF, DM, diabetic retinopathy    PT Comments    Ambulated a 2nd time today (practiced with pt and husband-husband guarded with gait belt and managed O2 tank). Difficulty getting consistent reading on tele box and with portable pulse ox while ambulating this afternoon. Walked x 1 on 2L and x1 on 3L. Sats were > 95% on 3L end of session.     Recommendations for follow up therapy are one component of a multi-disciplinary discharge planning process, led by the attending physician.  Recommendations may be updated based on patient status, additional functional criteria and insurance authorization.  Follow Up Recommendations  Home health PT     Assistance Recommended at Discharge PRN  Patient can return home with the following A little help with walking and/or transfers;A little help with bathing/dressing/bathroom   Equipment Recommendations  Rollator (4 wheels)    Recommendations for Other Services       Precautions / Restrictions Precautions Precautions: Fall Precaution Comments: monitor O2 Restrictions Weight Bearing Restrictions: No     Mobility  Bed Mobility               General bed mobility comments: up in chair    Transfers Overall transfer level: Needs assistance Equipment used: Rolling walker (2 wheels) Transfers: Sit to/from Stand Sit to Stand: Supervision           General transfer comment: Supv for safety with standard RW (practicing with pt and husband in case he wants to walk with her)    Ambulation/Gait Ambulation/Gait assistance: Min guard Gait Distance (Feet): 65 Feet (x2) Assistive device: Rolling walker (2 wheels) Gait Pattern/deviations: Step-through pattern, Decreased stride length       General Gait  Details: 1 seated rest break. Walked with standard RW to allow pt and husband practice (in case he wants to walk with her). O2 reading fluctuated quite a bit this afternoon-had trouble getting steady, consistent reading (walked on 2l then 3l)   Stairs             Wheelchair Mobility    Modified Rankin (Stroke Patients Only)       Balance Overall balance assessment: Needs assistance         Standing balance support: Bilateral upper extremity supported, Reliant on assistive device for balance, During functional activity Standing balance-Leahy Scale: Poor                              Cognition Arousal/Alertness: Awake/alert Behavior During Therapy: WFL for tasks assessed/performed Overall Cognitive Status: Within Functional Limits for tasks assessed                                          Exercises      General Comments        Pertinent Vitals/Pain Pain Assessment Pain Assessment: No/denies pain    Home Living                          Prior Function            PT Goals (current goals can now be  found in the care plan section) Progress towards PT goals: Progressing toward goals    Frequency    Min 3X/week      PT Plan Discharge plan needs to be updated    Co-evaluation              AM-PAC PT "6 Clicks" Mobility   Outcome Measure  Help needed turning from your back to your side while in a flat bed without using bedrails?: A Little Help needed moving from lying on your back to sitting on the side of a flat bed without using bedrails?: A Little Help needed moving to and from a bed to a chair (including a wheelchair)?: A Little Help needed standing up from a chair using your arms (e.g., wheelchair or bedside chair)?: A Little Help needed to walk in hospital room?: A Little Help needed climbing 3-5 steps with a railing? : A Little 6 Click Score: 18    End of Session Equipment Utilized During  Treatment: Oxygen;Gait belt Activity Tolerance: Patient limited by fatigue Patient left: in chair;with call bell/phone within reach;with family/visitor present   PT Visit Diagnosis: Unsteadiness on feet (R26.81);Muscle weakness (generalized) (M62.81)     Time: 1610-9604 PT Time Calculation (min) (ACUTE ONLY): 15 min  Charges:  $Gait Training: 8-22 mins                        Faye Ramsay, PT Acute Rehabilitation  Office: [phone] Pager: [phone]

## 2022-01-27 NOTE — Progress Notes (Signed)
Progress Note  Patient Name: Adrienne Bennett Date of Encounter: 01/27/2022  [facility] Cardiologist: [name], MD   Subjective   Feeling better.  Oxygen reduced to 3 L by nasal cannula.  Substantial net diuresis in last 24 hours (-1.5 L) and -3 L since admission.  However weight reportedly up 4 pounds, suspect this is an error in measurement. Still has trivial ankle edema.  No longer has chest heaviness.  Inpatient Medications    Scheduled Meds:  amLODipine  10 mg Oral Daily   aspirin EC  81 mg Oral Daily   atorvastatin  40 mg Oral QHS   clonazePAM  1 mg Oral BID   divalproex  250 mg Oral q morning   divalproex  500 mg Oral QHS   enoxaparin (LOVENOX) injection  40 mg Subcutaneous Q24H   fluticasone furoate-vilanterol  1 puff Inhalation Daily   furosemide  40 mg Intravenous BID   insulin aspart  0-15 Units Subcutaneous TID WC   insulin aspart  0-5 Units Subcutaneous QHS   insulin glargine-yfgn  10 Units Subcutaneous QHS   melatonin  10 mg Oral QHS   metoprolol tartrate  50 mg Oral BID   sodium chloride flush  3 mL Intravenous Q12H   umeclidinium bromide  1 puff Inhalation Daily   Continuous Infusions:  sodium chloride     PRN Meds: sodium chloride, acetaminophen, albuterol, ipratropium-albuterol, ondansetron (ZOFRAN) IV, mouth rinse, saline, sodium chloride, sodium chloride flush   Vital Signs    Vitals:   01/27/22 0500 01/27/22 0540 01/27/22 0838 01/27/22 0911  BP:  133/68  110/70  Pulse:  61  75  Resp:  19    Temp:  98.2 F (36.8 C)    TempSrc:  Oral    SpO2:  98% 97%   Weight: 68.7 kg     Height:        Intake/Output Summary (Last 24 hours) at 01/27/2022 0934 Last data filed at 01/27/2022 0306 Gross per 24 hour  Intake 843 ml  Output 1925 ml  Net -1082 ml      01/27/2022    5:00 AM 01/26/2022    4:38 AM 01/25/2022    4:06 AM  Last 3 Weights  Weight (lbs) 151 lb 7.3 oz 148 lb 2.4 oz 151 lb 7.3 oz  Weight (kg) 68.7 kg 67.2 kg 68.7 kg       Telemetry    Normal sinus rhythm- Personally Reviewed  ECG    No new tracing- Personally Reviewed  Physical Exam  Appears comfortable GEN: No acute distress.   Neck: Unable to see JVD Cardiac: RRR, no murmurs, rubs, or gallops.  Respiratory: Clear to auscultation bilaterally. GI: Soft, nontender, non-distended  MS: Trivial ankle bilateral edema; No deformity. Neuro:  Nonfocal  Psych: Normal affect   Labs    High Sensitivity Troponin:   Recent Labs  Lab 01/08/22 0129  TROPONINIHS 8     Chemistry Recent Labs  Lab 01/25/22 0455 01/26/22 0403 01/27/22 0352  NA 137 135 134*  K 3.6 4.1 4.9  CL 92* 95* 92*  CO2 36* 33* 29  GLUCOSE 206* 191* 208*  BUN 45* 46* 50*  CREATININE 1.54* 1.16* 1.33*  CALCIUM 8.5* 8.5* 8.9  MG 2.2 2.3 2.6*  PROT 6.8 7.0 7.7  ALBUMIN 2.8* 3.0* 3.3*  AST 37 37 37  ALT 32 31 31  ALKPHOS 107 106 130*  BILITOT 0.6 0.5 0.8  GFRNONAA 38* 54* 46*  ANIONGAP 9 7  13    Lipids No results for input(s): "CHOL", "TRIG", "HDL", "LABVLDL", "LDLCALC", "CHOLHDL" in the last 168 hours.  Hematology Recent Labs  Lab 01/25/22 0455 01/26/22 0403 01/27/22 0352  WBC 8.0 9.8 8.0  RBC 3.67* 3.64* 4.18  HGB 11.1* 11.0* 12.5  HCT 35.3* 35.2* 40.0  MCV 96.2 96.7 95.7  MCH 30.2 30.2 29.9  MCHC 31.4 31.3 31.3  RDW 14.3 14.2 14.1  PLT 207 241 213   Thyroid  Recent Labs  Lab 01/23/22 0744  TSH 1.008    BNP Recent Labs  Lab 01/23/22 0333 01/25/22 0455  BNP 461.0* 185.9*    DDimer No results for input(s): "DDIMER" in the last 168 hours.   Radiology    DG CHEST PORT 1 VIEW  Result Date: 01/26/2022 CLINICAL DATA:  CHF EXAM: PORTABLE CHEST 1 VIEW COMPARISON:  Chest radiograph from one day prior. FINDINGS: Left rotated chest radiograph stable cardiomediastinal silhouette with mild cardiomegaly. No pneumothorax. Stable small bilateral pleural effusions, left greater than right. Mild-to-moderate pulmonary edema common similar. Stable hazy bibasilar  lung opacities, favor atelectasis. IMPRESSION: Stable mild-to-moderate congestive heart failure with small bilateral pleural effusions, left greater than right. Electronically Signed   By: Ilona Sorrel M.D.   On: 01/26/2022 08:28   US RENAL  Result Date: 01/25/2022 CLINICAL DATA:  Acute kidney injury. EXAM: RENAL / URINARY TRACT ULTRASOUND COMPLETE COMPARISON:  CT, 04/29/2020. FINDINGS: Right Kidney: Renal measurements: 11.2 x 4.6 x 5.2 cm = volume: 140 mL. Normal parenchymal echogenicity. Cyst arises from the lower pole, 1.1 x 1.3 x 1.4 cm. No other masses, no stones and no hydronephrosis. Left Kidney: Renal measurements: 11.8 x 4.3 x 5.4 cm = volume: 144 mL. Normal parenchymal echogenicity. 2 masses, both consistent with cysts, upper pole measuring 1.7 x 1.5 x 1.7 cm and midpole measuring 1.5 x 1.1 x 1.3 cm. No other masses, no stones and no hydronephrosis. Bladder: Not visualized. Other: None. IMPRESSION: 1. No acute findings.  No hydronephrosis. 2. Single right and 2 left renal cysts. Electronically Signed   By: Lajean Manes M.D.   On: 01/25/2022 16:45    Cardiac Studies   ECHO: 01/23/2022  1. Left ventricular ejection fraction, by estimation, is 60 to 65%. The  left ventricle has normal function. The left ventricle has no regional  wall motion abnormalities. There is mild left ventricular hypertrophy.  Left ventricular diastolic parameters were normal.   2. Right ventricular systolic function is normal. The right ventricular  size is normal.   3. Small mixed echo luscent area posterior to the AV groove may represent  a benign pericardial cyst can consider CT scan to further evaluate but not  likely clinically necessary.   4. The mitral valve is abnormal. Trivial mitral valve regurgitation. No  evidence of mitral stenosis.   5. Very mild stenosis by valve area mean gradient only 8 and DVI 0.68.  The aortic valve is normal in structure. There is moderate calcification  of the aortic valve.  There is moderate thickening of the aortic valve.  Aortic valve regurgitation is mild.  Mild aortic valve stenosis.   6. The inferior vena cava is normal in size with greater than 50%  respiratory variability, suggesting right atrial pressure of 3 mmHg.    MYOVIEW: 12/02/2020 Nuclear stress EF: 46%. There was no ST segment deviation noted during stress. No T wave inversion was noted during stress. There were occasional PVCs throughout the infusion and post-infusion period. The left ventricular ejection fraction  is mildly decreased (45-54%). Recommend correlation with TTE for better quantification. There is normal myocaridal perfusion with no ischemia or infarction. This is a low risk study.  Patient Profile     61 y.o. female with a hx of HTN, HLD, DM2, HFpEF, COPD, PTSD/bipolar readmitted with acute heart failure exacerbation  Assessment & Plan    Acute on chronic diastolic heart failure: Improving, I hope we will be able to avoid the need for right heart catheterization.  Continue intravenous diuretics.  Had a long discussion regarding dietary sodium restriction, daily weight monitoring, concept of "dry weight", signs and symptoms of heart failure.  We will give her appropriate literature.  Add SGLT2 inhibitor.  Chest pain resolved with diuresis and was likely due to heart failure/subendocardial ischemia due to high filling pressures. Acute on chronic respiratory insufficiency with hypoxia: Multifactorial, probable component of COPD (history of prolonged secondhand smoke exposure for decades) in addition to heart failure.  Ideally we will have her on room air by the time of discharge. Hypertension: Adequate control. AKI: Mild worsening of renal function due to diuretics.  Monitor daily. DM: SGLT2 inhibitor will help with glucose control as well.  Most recent hemoglobin A1c a month ago was 8.5%.     For questions or updates, please contact [facility] Please consult [email or web address] for  contact info under        Signed, Thurmon Fair, MD  01/27/2022, 9:34 AM

## 2022-01-27 NOTE — Progress Notes (Signed)
PROGRESS NOTE   Adrienne Bennett  [id number] DOB: September 21, 1960 DOA: 01/23/2022 PCP: Colon Branch, MD   Brief Narrative:  The patient is a 61 year old overweight Caucasian female with a past medical history significant for but limited to COPD, PTSD/bipolar disorder, chronic diastolic CHF, diabetes mellitus type 2 with diabetic retinopathy, hypertension, hyperlipidemia as well as other comorbidities who presented with respiratory distress.  She was recently discharged from the hospital after a COPD exacerbation and was at home and noticed worsening dyspnea over the last few days.  She normally wears 2 L of oxygen but she is desaturating to 80% on 3 L.  She had increased peripheral and central edema over the last several days and along with weight gain and ended up having to be placed on BiPAP.  She is started on diuresis and started improving.  She is weaned off of the BiPAP and admitted for further evaluation management for acute on chronic respiratory failure in setting of acute on chronic diastolic CHF   **Interim History   She is diuresing well and improving slowly. Remains on 5 Liters of Supplemental O2 when she has a baseline of 2 Liters.  We continued IV diuresis yesterday but held today given her renal function slightly bumped.  We will need cardiology further evaluation recommendations and will message cardiology for consultation in the morning.  Renal function has bumped up case was discussed with nephrology and they do not feel it is too significant recommending to continue monitor closely.  In the interim we have done a basic renal work-up with urine lites, urinalysis and renal ultrasound.  Renal function improved again so we will resume diuresis.  Cardiology was consulted and recommending continuing diuresis.  They feel that her renal function decrease further without resolution of her dyspnea and hypoxemia the patient should undergo a right heart cath as well as possibly a left heart cath.    Assessment and Plan:  Acute on chronic hypoxemic and hypercarbic respiratory failure in the setting of acute on chronic Diastolic CHF CHF: -Patient presented with shortness of breath, bilateral leg swelling.  Last echo on 5/22 shows preserved ejection fraction with grade 2 diastolic dysfunction and aortic sclerosis without stenosis -BNP 461 on admission and trended down to 185.9.  Initial Chest x-ray concerning for bilateral pleural effusion.   -Patient was on BiPAP and now weaned to 5 L.  Will admit patient on the stepdown unit and changed to progressive -Continued Lasix IV 40 mg twice daily however renal function worsened so diuresis was held today -Repeat echocardiogram shows no evidence of systolic or diastolic CHF now. -SpO2: [id number] % O2 Flow Rate (L/min): 3 L/min FiO2 (%): 35 % and Continued supplemental oxygen via nasal cannula and wean O2 as tolerated -Continuous pulse oximetry maintain O2 saturation greater 90% -Strict INO's and daily weight.  Monitor kidney function.  Check electrolytes. -She -2.672 mL since admission -Continue monitor for signs and symptoms of volume overload -Chest x-ray yesterday showed "Compared with yesterday, possible mild superimposed pulmonary edema. The bibasilar airspace opacities and pleural effusions have not significantly changed." -We will add flutter valve and incentive spirometry -Repeat chest x-ray in the a.m. and will need ambulatory home O2 screen prior to discharge; since Lasix has been held we will call cardiology in the morning for further evaluation recommendations; case was discussed with nephrology who feels that her creatinine is not as significant for them to get involved so they recommend getting cardiology to further evaluate -Cardiology evaluated and chest  x-ray done showed not much improvement as her O2 requirements are still higher than normal. -Cardiology believes that she will need more Lasix plus or minus metolazone and recommending  continuing diuresis given her significant volume overload -Repeat CXR in the AM    Chest Pain -Resolved and she was having at the time of admission -States improved after she was diuresed and breathing improved -Cardiac enzymes were negative and echo showed normal wall motion with no abnormalities noted -Cardiology following and recommending continuing diuretics and recommending possible right heart cath if she continues to have further renal function deterioration without resolution of her dyspnea or hypoxemia -Cardiology to decide on progression tomorrow and likely have right heart cath performed on January 28, 2022 and if they do proceed with a right heart cath and depending on renal parameters they may also do a left heart cath to her chest pain complaint on admission   Type 2 diabetes on long-term insulin with uncontrolled hyperglycemia and diabetic retinopathy: -Last A1c 8.5%.  Blood sugar elevated upon arrival. -Hold metformin.  Start on sliding scale insulin.  Monitor blood sugar closely and adjust insulin regimen as necessary -CBGs ranging from 178-255 -Diabetes education coordinator evaluated and based on recommendations we will add 10 units of Semglee nightly   COPD with chronic hypoxic respiratory failure on 2 L of supplemental oxygen at home -Continue home inhalers with Breo Ellipta and Incruse Ellipta and also has DuoNeb 3 MLS 3 times daily as needed for wheezing or shortness of breath -We will add guaifenesin 1200 g p.o. twice daily, flutter valve and incentive spirometry -Unfortunately SARS-CoV-2 and influenz A and B panel still not done for unclear reasons   AKI on CKD stage IIIb Hyperphosphatemia -Patient's BUNs/creatinine went from 25/1.08 -> 31/1.26 -> 45/1.54 so diuresis was held but now resumed.  Patient's BUNs/creatinine is now 46/1.16 yesterday but has slightly worsened to 50/1.33 slightly and will continue to Monitor carefully  -Avoid further nephrotoxic medications and  contrast dyes if possible, hypotension and dehydration to ensure adequate renal perfusion and will need to renally adjust medications -Patient's Phos Level was 5.0 -Renal ultrasound obtained and urinalysis done and showed clear urine with straw-colored greater than 500 glucose, small leukocytes, negative nitrites and no bacteria seen with 11-20 WBCs; urine creatinine 34.84 and urine sodium was 14 -Renal ultrasound done and showed "No acute findings.  No hydronephrosis. Single right and 2 left renal cysts." -Continue monitor and trend renal function carefully repeat CMP in a.m.   Hypertension -Blood pressure elevated on arrival.  Now stable. -Resume home medications amlodipine and metoprolol -Continue monitor blood pressures per protocol -Last blood pressure reading was 132/116   History of bipolar disorder/PTSD/depression -At baseline -Continue home meds Depakote, Klonopin, melatonin   Hyperlipidemia -Continue Atorvastatin 40 mg p.o. nightly   GERD/GI prophylaxis -Not on PPI but will add if necessary   Leukocytosis -Likely secondary to recent steroid use from her recent COPD exacerbation.  Patient is afebrile.   -Patient's WBC went from 9.6  -> 8.0 -> 9.8 -> 8.0 -Continue monitor for signs and symptoms of infection and repeat CBC tomorrow AM.   Hypokalemia -In the setting of diuresis and improved as K+ is now 4.9 -Mag Level is now 2.3 -Patient's potassium is now improved to 4.1 -> 4.9 -Continue monitor replete as necessary   Normocytic Anemia -Patient's hemoglobin/hematocrit is now gone from 11.0/35.2 -> 12.5/40.0 -Check Anemia Panel in the AM -Continue to monitor for signs and symptoms of bleeding; no overt bleeding  noted -Repeat CBC in a.m.  Hypoalbuminemia -Patient's Albumin Level went from 3.0 -> 3.3 -Continue to Monitor and Trend -Repeat CMP in the AM   DVT prophylaxis: enoxaparin (LOVENOX) injection 40 mg Start: 01/23/22 0800 SCDs Start: 01/23/22 0743    Code  Status: Full Code Family Communication: No family present at bedside   Disposition Plan:  Level of care: Progressive Status is: Inpatient Remains inpatient appropriate because: Continues to be diuresed    Consultants:  Cardiology Case was discussed with nephrology  Procedures:  Echocardiogram IMPRESSIONS     1. Left ventricular ejection fraction, by estimation, is 60 to 65%. The  left ventricle has normal function. The left ventricle has no regional  wall motion abnormalities. There is mild left ventricular hypertrophy.  Left ventricular diastolic parameters  were normal.   2. Right ventricular systolic function is normal. The right ventricular  size is normal.   3. Small mixed echo luscent area posterior to the AV groove may represent  a benign pericardial cyst can consider CT scan to further evaluate but not  likely clinically necessary.   4. The mitral valve is abnormal. Trivial mitral valve regurgitation. No  evidence of mitral stenosis.   5. Very mild stenosis by valve area mean gradient only 8 and DVI 0.68.  The aortic valve is normal in structure. There is moderate calcification  of the aortic valve. There is moderate thickening of the aortic valve.  Aortic valve regurgitation is mild.  Mild aortic valve stenosis.   6. The inferior vena cava is normal in size with greater than 50%  respiratory variability, suggesting right atrial pressure of 3 mmHg.   FINDINGS   Left Ventricle: Left ventricular ejection fraction, by estimation, is 60  to 65%. The left ventricle has normal function. The left ventricle has no  regional wall motion abnormalities. Definity contrast agent was given IV  to delineate the left ventricular   endocardial borders. The left ventricular internal cavity size was normal  in size. There is mild left ventricular hypertrophy. Left ventricular  diastolic parameters were normal.   Right Ventricle: The right ventricular size is normal. No increase in   right ventricular wall thickness. Right ventricular systolic function is  normal.   Left Atrium: Left atrial size was normal in size.   Right Atrium: Right atrial size was normal in size.   Pericardium: Small mixed echo luscent area posterior to the AV groove may  represent a benign pericardial cyst can consider CT scan to further  evaluate but not likely clinically necessary. There is no evidence of  pericardial effusion.   Mitral Valve: The mitral valve is abnormal. There is mild thickening of  the mitral valve leaflet(s). There is mild calcification of the mitral  valve leaflet(s). Trivial mitral valve regurgitation. No evidence of  mitral valve stenosis.   Tricuspid Valve: The tricuspid valve is normal in structure. Tricuspid  valve regurgitation is not demonstrated. No evidence of tricuspid  stenosis.   Aortic Valve: Very mild stenosis by valve area mean gradient only 8 and  DVI 0.68. The aortic valve is normal in structure. There is moderate  calcification of the aortic valve. There is moderate thickening of the  aortic valve. Aortic valve regurgitation  is mild. Aortic regurgitation PHT measures 589 msec. Mild aortic stenosis  is present. Aortic valve mean gradient measures 8.0 mmHg. Aortic valve  peak gradient measures 14.5 mmHg. Aortic valve area, by VTI measures 1.92  cm.   Pulmonic Valve: The pulmonic  valve was normal in structure. Pulmonic valve  regurgitation is mild. No evidence of pulmonic stenosis.   Aorta: The aortic root is normal in size and structure.   Venous: The inferior vena cava is normal in size with greater than 50%  respiratory variability, suggesting right atrial pressure of 3 mmHg.   IAS/Shunts: No atrial level shunt detected by color flow Doppler.      LEFT VENTRICLE  PLAX 2D  LVIDd:         4.15 cm     Diastology  LVIDs:         2.90 cm     LV e' medial:    6.74 cm/s  LV PW:         1.10 cm     LV E/e' medial:  17.1  LV IVS:        1.20  cm     LV e' lateral:   9.14 cm/s  LVOT diam:     1.90 cm     LV E/e' lateral: 12.6  LV SV:         71  LV SV Index:   42  LVOT Area:     2.84 cm     LV Volumes (MOD)  LV vol d, MOD A2C: 93.3 ml  LV vol d, MOD A4C: 91.7 ml  LV vol s, MOD A2C: 20.6 ml  LV vol s, MOD A4C: 29.0 ml  LV SV MOD A2C:     72.7 ml  LV SV MOD A4C:     91.7 ml  LV SV MOD BP:      67.2 ml   RIGHT VENTRICLE          IVC  RV Basal diam:  3.20 cm  IVC diam: 1.70 cm  RV Mid diam:    2.20 cm   LEFT ATRIUM             Index        RIGHT ATRIUM           Index  LA diam:        3.50 cm 2.06 cm/m   RA Area:     10.10 cm  LA Vol (A2C):   44.1 ml 25.92 ml/m  RA Volume:   16.20 ml  9.52 ml/m  LA Vol (A4C):   26.2 ml 15.40 ml/m  LA Biplane Vol: 35.5 ml 20.87 ml/m   AORTIC VALVE                     PULMONIC VALVE  AV Area (Vmax):    1.91 cm      PV Vmax:          1.09 m/s  AV Area (Vmean):   1.88 cm      PV Peak grad:     4.8 mmHg  AV Area (VTI):     1.92 cm      PR End Diast Vel: 6.04 msec  AV Vmax:           190.50 cm/s  AV Vmean:          128.500 cm/s  AV VTI:            0.368 m  AV Peak Grad:      14.5 mmHg  AV Mean Grad:      8.0 mmHg  LVOT Vmax:         128.50 cm/s  LVOT Vmean:  85.050 cm/s  LVOT VTI:          0.250 m  LVOT/AV VTI ratio: 0.68  AI PHT:            589 msec     AORTA  Ao Root diam: 3.20 cm  Ao Asc diam:  3.20 cm   MITRAL VALVE                TRICUSPID VALVE  MV Area (PHT): 4.71 cm     TR Peak grad:   26.0 mmHg  MV Decel Time: 161 msec     TR Vmax:        255.00 cm/s  MV E velocity: 115.00 cm/s  MV A velocity: 99.80 cm/s   SHUNTS  MV E/A ratio:  1.15         Systemic VTI:  0.25 m                              Systemic Diam: 1.90 cm    Renal ultrasound  Antimicrobials:  Anti-infectives (From admission, onward)    None       Subjective: Seen and examined at bedside and still feels a little short of breath but she thinks she is improving.  2.  Denies any  lightheadedness or dizziness.  Hoping to get a right heart catheterization.  Denies any chest pain.  No other concerns or complaints at this time.  Objective: Vitals:   01/27/22 0540 01/27/22 0838 01/27/22 0911 01/27/22 1228  BP: 133/68  110/70 (!) 132/116  Pulse: 61  75 66  Resp: 19   19  Temp: 98.2 F (36.8 C)   98.4 F (36.9 C)  TempSrc: Oral     SpO2: 98% 97%  100%  Weight:      Height:        Intake/Output Summary (Last 24 hours) at 01/27/2022 1405 Last data filed at 01/27/2022 0900 Gross per 24 hour  Intake 723 ml  Output 1125 ml  Net -402 ml   Filed Weights   01/25/22 0406 01/26/22 0438 01/27/22 0500  Weight: 68.7 kg 67.2 kg 68.7 kg   Examination: Physical Exam:  Constitutional: WN/WD overweight Caucasian female currently no acute distress sitting in the chair Respiratory: Diminished to auscultation bilaterally with coarse breath sounds and has some crackles, no wheezing, rales, rhonchi or crackles. Normal respiratory effort and patient is not tachypenic. No accessory muscle use.  Unlabored breathing.  On supplemental oxygen Cardiovascular: RRR, no murmurs / rubs / gallops. S1 and S2 auscultated.  Has trace lower extremity edema Abdomen: Soft, non-tender, distended secondary body habitus. bowel sounds positive.  GU: Deferred. Musculoskeletal: No clubbing / cyanosis of digits/nails. No joint deformity upper and lower extremities.  Skin: No rashes, lesions, ulcers. No induration; Warm and dry.  Neurologic: CN 2-12 grossly intact with no focal deficits. Romberg sign and cerebellar reflexes not assessed.  Psychiatric: Normal judgment and insight. Alert and oriented x 3. Normal mood and appropriate affect.   Data Reviewed: I have personally reviewed following labs and imaging studies  CBC: Recent Labs  Lab 01/23/22 0743 01/24/22 1600 01/25/22 0455 01/26/22 0403 01/27/22 0352  WBC 11.2* 9.6 8.0 9.8 8.0  NEUTROABS 8.5* 5.7 4.2 5.5 4.6  HGB 11.9* 11.8* 11.1* 11.0* 12.5   HCT 37.7 38.5 35.3* 35.2* 40.0  MCV 95.2 99.5 96.2 96.7 95.7  PLT 186 205 207 241 [id number]   Basic Metabolic Panel: Recent Labs  Lab 01/23/22 0333 01/23/22 0743 01/24/22 0445 01/24/22 1600 01/25/22 0455 01/26/22 0403 01/27/22 0352  NA 133*  --  141  --  137 135 134*  K 4.6  --  3.0*  --  3.6 4.1 4.9  CL 91*  --  95*  --  92* 95* 92*  CO2 31  --  38*  --  36* 33* 29  GLUCOSE 539*  --  86  --  206* 191* 208*  BUN 25*  --  31*  --  45* 46* 50*  CREATININE 1.08* 1.15* 1.26*  --  1.54* 1.16* 1.33*  CALCIUM 8.9  --  9.0  --  8.5* 8.5* 8.9  MG  --  2.0  --  2.1 2.2 2.3 2.6*  PHOS  --   --   --  5.2* 5.5* 4.7* 5.0*   GFR: Estimated Creatinine Clearance: 39.4 mL/min (A) (by C-G formula based on SCr of 1.33 mg/dL (H)). Liver Function Tests: Recent Labs  Lab 01/24/22 1600 01/25/22 0455 01/26/22 0403 01/27/22 0352  AST 31 37 37 37  ALT 31 32 31 31  ALKPHOS 109 107 106 130*  BILITOT 0.6 0.6 0.5 0.8  PROT 6.9 6.8 7.0 7.7  ALBUMIN 2.9* 2.8* 3.0* 3.3*   No results for input(s): "LIPASE", "AMYLASE" in the last 168 hours. No results for input(s): "AMMONIA" in the last 168 hours. Coagulation Profile: No results for input(s): "INR", "PROTIME" in the last 168 hours. Cardiac Enzymes: No results for input(s): "CKTOTAL", "CKMB", "CKMBINDEX", "TROPONINI" in the last 168 hours. BNP (last 3 results) Recent Labs    01/19/22 1135  PROBNP 496.0*   HbA1C: No results for input(s): "HGBA1C" in the last 72 hours. CBG: Recent Labs  Lab 01/26/22 1125 01/26/22 1629 01/26/22 2141 01/27/22 0743 01/27/22 1129  GLUCAP 246* 234* 255* 178* 217*   Lipid Profile: No results for input(s): "CHOL", "HDL", "LDLCALC", "TRIG", "CHOLHDL", "LDLDIRECT" in the last 72 hours. Thyroid Function Tests: No results for input(s): "TSH", "T4TOTAL", "FREET4", "T3FREE", "THYROIDAB" in the last 72 hours. Anemia Panel: No results for input(s): "VITAMINB12", "FOLATE", "FERRITIN", "TIBC", "IRON", "RETICCTPCT" in the  last 72 hours. Sepsis Labs: No results for input(s): "PROCALCITON", "LATICACIDVEN" in the last 168 hours.  No results found for this or any previous visit (from the past 240 hour(s)).   Radiology Studies: DG CHEST PORT 1 VIEW  Result Date: 01/26/2022 CLINICAL DATA:  CHF EXAM: PORTABLE CHEST 1 VIEW COMPARISON:  Chest radiograph from one day prior. FINDINGS: Left rotated chest radiograph stable cardiomediastinal silhouette with mild cardiomegaly. No pneumothorax. Stable small bilateral pleural effusions, left greater than right. Mild-to-moderate pulmonary edema common similar. Stable hazy bibasilar lung opacities, favor atelectasis. IMPRESSION: Stable mild-to-moderate congestive heart failure with small bilateral pleural effusions, left greater than right. Electronically Signed   By: Ilona Sorrel M.D.   On: 01/26/2022 08:28   US RENAL  Result Date: 01/25/2022 CLINICAL DATA:  Acute kidney injury. EXAM: RENAL / URINARY TRACT ULTRASOUND COMPLETE COMPARISON:  CT, 04/29/2020. FINDINGS: Right Kidney: Renal measurements: 11.2 x 4.6 x 5.2 cm = volume: 140 mL. Normal parenchymal echogenicity. Cyst arises from the lower pole, 1.1 x 1.3 x 1.4 cm. No other masses, no stones and no hydronephrosis. Left Kidney: Renal measurements: 11.8 x 4.3 x 5.4 cm = volume: 144 mL. Normal parenchymal echogenicity. 2 masses, both consistent with cysts, upper pole measuring 1.7 x 1.5 x 1.7 cm and midpole measuring 1.5 x 1.1 x 1.3 cm. No other masses, no stones and  no hydronephrosis. Bladder: Not visualized. Other: None. IMPRESSION: 1. No acute findings.  No hydronephrosis. 2. Single right and 2 left renal cysts. Electronically Signed   By: Amie Portland M.D.   On: 01/25/2022 16:45    Scheduled Meds:  amLODipine  10 mg Oral Daily   aspirin EC  81 mg Oral Daily   atorvastatin  40 mg Oral QHS   clonazePAM  1 mg Oral BID   dapagliflozin propanediol  10 mg Oral Daily   divalproex  250 mg Oral q morning   divalproex  500 mg Oral QHS    enoxaparin (LOVENOX) injection  40 mg Subcutaneous Q24H   fluticasone furoate-vilanterol  1 puff Inhalation Daily   furosemide  40 mg Intravenous BID   insulin aspart  0-15 Units Subcutaneous TID WC   insulin aspart  0-5 Units Subcutaneous QHS   insulin glargine-yfgn  10 Units Subcutaneous QHS   melatonin  10 mg Oral QHS   metoprolol tartrate  50 mg Oral BID   sodium chloride flush  3 mL Intravenous Q12H   umeclidinium bromide  1 puff Inhalation Daily   Continuous Infusions:  sodium chloride      LOS: 4 days   Marguerita Merles, DO [facility] Available via Epic secure chat 7am-7pm After these hours, please refer to coverage provider listed on [email or web address] 01/27/2022, 2:05 PM

## 2022-01-28 ENCOUNTER — Inpatient Hospital Stay (HOSPITAL_COMMUNITY): Payer: Medicare Other

## 2022-01-28 DIAGNOSIS — R52 Pain, unspecified: Secondary | ICD-10-CM | POA: Diagnosis not present

## 2022-01-28 DIAGNOSIS — I5033 Acute on chronic diastolic (congestive) heart failure: Secondary | ICD-10-CM | POA: Diagnosis not present

## 2022-01-28 DIAGNOSIS — M7989 Other specified soft tissue disorders: Secondary | ICD-10-CM | POA: Diagnosis not present

## 2022-01-28 LAB — CBC WITH DIFFERENTIAL/PLATELET
Abs Immature Granulocytes: 0.05 10*3/uL (ref 0.00–0.07)
Basophils Absolute: 0 10*3/uL (ref 0.0–0.1)
Basophils Relative: 0 %
Eosinophils Absolute: 0.1 10*3/uL (ref 0.0–0.5)
Eosinophils Relative: 1 %
HCT: 36.5 % (ref 36.0–46.0)
Hemoglobin: 11.5 g/dL — ABNORMAL LOW (ref 12.0–15.0)
Immature Granulocytes: 1 %
Lymphocytes Relative: 26 %
Lymphs Abs: 2.6 10*3/uL (ref 0.7–4.0)
MCH: 30 pg (ref 26.0–34.0)
MCHC: 31.5 g/dL (ref 30.0–36.0)
MCV: 95.3 fL (ref 80.0–100.0)
Monocytes Absolute: 1.3 10*3/uL — ABNORMAL HIGH (ref 0.1–1.0)
Monocytes Relative: 13 %
Neutro Abs: 5.8 10*3/uL (ref 1.7–7.7)
Neutrophils Relative %: 59 %
Platelets: 261 10*3/uL (ref 150–400)
RBC: 3.83 MIL/uL — ABNORMAL LOW (ref 3.87–5.11)
RDW: 14 % (ref 11.5–15.5)
WBC: 10 10*3/uL (ref 4.0–10.5)
nRBC: 0 % (ref 0.0–0.2)

## 2022-01-28 LAB — COMPREHENSIVE METABOLIC PANEL
ALT: 29 U/L (ref 0–44)
AST: 28 U/L (ref 15–41)
Albumin: 3.3 g/dL — ABNORMAL LOW (ref 3.5–5.0)
Alkaline Phosphatase: 118 U/L (ref 38–126)
Anion gap: 10 (ref 5–15)
BUN: 58 mg/dL — ABNORMAL HIGH (ref 8–23)
CO2: 36 mmol/L — ABNORMAL HIGH (ref 22–32)
Calcium: 9.4 mg/dL (ref 8.9–10.3)
Chloride: 90 mmol/L — ABNORMAL LOW (ref 98–111)
Creatinine, Ser: 1.55 mg/dL — ABNORMAL HIGH (ref 0.44–1.00)
GFR, Estimated: 38 mL/min — ABNORMAL LOW (ref >60)
Glucose, Bld: 218 mg/dL — ABNORMAL HIGH (ref 70–99)
Potassium: 5.3 mmol/L — ABNORMAL HIGH (ref 3.5–5.1)
Sodium: 136 mmol/L (ref 135–145)
Total Bilirubin: 0.5 mg/dL (ref 0.3–1.2)
Total Protein: 7.8 g/dL (ref 6.5–8.1)

## 2022-01-28 LAB — GLUCOSE, CAPILLARY
Glucose-Capillary: 214 mg/dL — ABNORMAL HIGH (ref 70–99)
Glucose-Capillary: 206 mg/dL — ABNORMAL HIGH (ref 70–99)
Glucose-Capillary: 223 mg/dL — ABNORMAL HIGH (ref 70–99)
Glucose-Capillary: 175 mg/dL — ABNORMAL HIGH (ref 70–99)
Glucose-Capillary: 327 mg/dL — ABNORMAL HIGH (ref 70–99)

## 2022-01-28 LAB — PHOSPHORUS: Phosphorus: 6.8 mg/dL — ABNORMAL HIGH (ref 2.5–4.6)

## 2022-01-28 LAB — MAGNESIUM: Magnesium: 2.7 mg/dL — ABNORMAL HIGH (ref 1.7–2.4)

## 2022-01-28 MED ORDER — CYCLOBENZAPRINE HCL 10 MG PO TABS
10.0000 mg | ORAL_TABLET | Freq: Once | ORAL | Status: AC
  Administered 2022-01-28: 10 mg via ORAL
  Filled 2022-01-28: qty 1

## 2022-01-28 MED ORDER — APIXABAN 5 MG PO TABS: 5.0000 mg | ORAL_TABLET | Freq: Two times a day (BID) | ORAL | Status: DC

## 2022-01-28 MED ORDER — APIXABAN 5 MG PO TABS
5.0000 mg | ORAL_TABLET | Freq: Two times a day (BID) | ORAL | Status: DC
  Administered 2022-01-28 – 2022-01-29 (×2): 5 mg via ORAL
  Filled 2022-01-28 (×2): qty 1

## 2022-01-28 MED ORDER — FUROSEMIDE 40 MG PO TABS
80.0000 mg | ORAL_TABLET | Freq: Every day | ORAL | Status: DC
  Administered 2022-01-28 – 2022-01-29 (×2): 80 mg via ORAL
  Filled 2022-01-28 (×2): qty 2

## 2022-01-28 NOTE — Progress Notes (Signed)
BLE venous duplex has been completed.  Results can be found under chart review under CV PROC. 01/28/2022 3:59 PM Makeyla Govan RVT, RDMS

## 2022-01-28 NOTE — Progress Notes (Signed)
PT Cancellation Note  Patient Details Name: Adrienne Bennett MRN: [id number] DOB: 1960-12-03   Cancelled Treatment:    Reason Eval/Treat Not Completed: Medical issues which prohibited therapy (dopplers pending to r/o DVT LLE. Will follow.)   Tamala Ser PT 01/28/2022  Acute Rehabilitation Services  Office [phone]

## 2022-01-28 NOTE — Progress Notes (Addendum)
Progress Note  Patient Name: Adrienne Bennett Date of Encounter: 01/28/2022  [facility] Cardiologist: [name], MD   Subjective   Breathing definitely improved compared to initial admission.  Chest pain has completely resolved.  Trying to do incentive spirometry however only able to pull in 375 ml of air.   Inpatient Medications    Scheduled Meds:  amLODipine  10 mg Oral Daily   aspirin EC  81 mg Oral Daily   atorvastatin  40 mg Oral QHS   clonazePAM  1 mg Oral BID   dapagliflozin propanediol  10 mg Oral Daily   divalproex  250 mg Oral q morning   divalproex  500 mg Oral QHS   enoxaparin (LOVENOX) injection  40 mg Subcutaneous Q24H   fluticasone furoate-vilanterol  1 puff Inhalation Daily   furosemide  80 mg Oral Daily   insulin aspart  0-15 Units Subcutaneous TID WC   insulin aspart  0-5 Units Subcutaneous QHS   insulin glargine-yfgn  10 Units Subcutaneous QHS   melatonin  10 mg Oral QHS   metoprolol tartrate  50 mg Oral BID   sodium chloride flush  3 mL Intravenous Q12H   umeclidinium bromide  1 puff Inhalation Daily   Continuous Infusions:  sodium chloride     PRN Meds: sodium chloride, acetaminophen, albuterol, ipratropium-albuterol, ondansetron (ZOFRAN) IV, mouth rinse, saline, sodium chloride, sodium chloride flush   Vital Signs    Vitals:   01/28/22 0447 01/28/22 0500 01/28/22 0845 01/28/22 0902  BP: 136/83   129/60  Pulse: 64   63  Resp:      Temp:      TempSrc:      SpO2:   94%   Weight:  67.2 kg    Height:        Intake/Output Summary (Last 24 hours) at 01/28/2022 0904 Last data filed at 01/28/2022 0450 Gross per 24 hour  Intake 240 ml  Output 3500 ml  Net -3260 ml      01/28/2022    5:00 AM 01/27/2022    5:00 AM 01/26/2022    4:38 AM  Last 3 Weights  Weight (lbs) 148 lb 2.4 oz 151 lb 7.3 oz 148 lb 2.4 oz  Weight (kg) 67.2 kg 68.7 kg 67.2 kg      Telemetry    Currently maintaining sinus rhythm, brief episode of atrial fibrillation  on 01/23/2022- Personally Reviewed  ECG    Normal sinus rhythm, no significant ST-T wave changes- Personally Reviewed  Physical Exam   GEN: No acute distress.   Neck: No JVD Cardiac: RRR, no murmurs, rubs, or gallops.  Respiratory: Markedly diminished breath sound in bilateral bases of lung. GI: Soft, nontender, non-distended  MS: No edema; No deformity. Neuro:  Nonfocal  Psych: Normal affect   Labs    High Sensitivity Troponin:   Recent Labs  Lab 01/08/22 0129  TROPONINIHS 8     Chemistry Recent Labs  Lab 01/26/22 0403 01/27/22 0352 01/28/22 0420  NA 135 134* 136  K 4.1 4.9 5.3*  CL 95* 92* 90*  CO2 33* 29 36*  GLUCOSE 191* 208* 218*  BUN 46* 50* 58*  CREATININE 1.16* 1.33* 1.55*  CALCIUM 8.5* 8.9 9.4  MG 2.3 2.6* 2.7*  PROT 7.0 7.7 7.8  ALBUMIN 3.0* 3.3* 3.3*  AST 37 37 28  ALT 31 31 29   ALKPHOS 106 130* 118  BILITOT 0.5 0.8 0.5  GFRNONAA 54* 46* 38*  ANIONGAP 7 13 10  Lipids No results for input(s): "CHOL", "TRIG", "HDL", "LABVLDL", "LDLCALC", "CHOLHDL" in the last 168 hours.  Hematology Recent Labs  Lab 01/26/22 0403 01/27/22 0352 01/28/22 0420  WBC 9.8 8.0 10.0  RBC 3.64* 4.18 3.83*  HGB 11.0* 12.5 11.5*  HCT 35.2* 40.0 36.5  MCV 96.7 95.7 95.3  MCH 30.2 29.9 30.0  MCHC 31.3 31.3 31.5  RDW 14.2 14.1 14.0  PLT 241 213 261   Thyroid  Recent Labs  Lab 01/23/22 0744  TSH 1.008    BNP Recent Labs  Lab 01/23/22 0333 01/25/22 0455  BNP 461.0* 185.9*    DDimer No results for input(s): "DDIMER" in the last 168 hours.   Radiology    No results found.  Cardiac Studies   Echo 01/23/2022  1. Left ventricular ejection fraction, by estimation, is 60 to 65%. The  left ventricle has normal function. The left ventricle has no regional  wall motion abnormalities. There is mild left ventricular hypertrophy.  Left ventricular diastolic parameters  were normal.   2. Right ventricular systolic function is normal. The right ventricular  size  is normal.   3. Small mixed echo luscent area posterior to the AV groove may represent  a benign pericardial cyst can consider CT scan to further evaluate but not  likely clinically necessary.   4. The mitral valve is abnormal. Trivial mitral valve regurgitation. No  evidence of mitral stenosis.   5. Very mild stenosis by valve area mean gradient only 8 and DVI 0.68.  The aortic valve is normal in structure. There is moderate calcification  of the aortic valve. There is moderate thickening of the aortic valve.  Aortic valve regurgitation is mild.  Mild aortic valve stenosis.   6. The inferior vena cava is normal in size with greater than 50%  respiratory variability, suggesting right atrial pressure of 3 mmHg.   Patient Profile     61 y.o. female with PMH of HTN, HLD, DM II, HFpEF, COPD, bipolar who was recently admitted with COPD exacerbation on 6/15 returned with worsening dyspnea and chest pain. CXR showed likely pulm edema. Underwent IV diuresis, breathing improved, so did chest pain. Noted possible afib in the ED. Remain on 3L O2 [place] this morning.   Assessment & Plan    Acute on chronic diastolic CHF  -On IV diuresis.  Both BUN and creatinine went up.  Creatinine went up to 1.5 this morning.  IV diuresis held, switch to 80 mg daily of p.o. Lasix. I/O -5.9L.  On physical exam, patient has markedly diminished breath sounds in bibasilar area.  Pending chest x-ray, suspect pulmonary component.  Patient complained of left lower extremity more than right lower extremity edema, discussed with primary team who is planning to do a venous Doppler on the left side  HTN  PAF: Brief episode of atrial fibrillation recently in the emergency room.  Will discuss with MD. Not a great candidate for Eliquis since she is on depakote, only option would be warfarin.  Chest pain: pressure like sensation that went away after IV diuresis. Likely related to breathing.       For questions or updates, please  contact [facility] Please consult [email or web address] for contact info under        Signed, Azalee Course, PA  01/28/2022, 9:04 AM    I have seen and examined the patient along with Azalee Course, PA .  I have reviewed the chart, notes and new data.  I agree with PA/NP's note.  Key new complaints: Left leg pain involves the anterior surface of both the thigh and the calf.  To me it sounds neuropathic. Key examination changes: Diminished breath sounds in both bases, but no moist rales are heard.  There is no evidence of asymmetrical swelling or tenderness in the left lower extremity, in fact there is no residual edema on either side.  Weight is down substantially, although recorded weights have been erratic during this hospitalization. Key new findings / data: Was able to do incentive spirometry roughly 700 mL for me.  Creatinine has increased to 1.5.  Potassium borderline high at 5.3.  Chest x-ray shows improving fluid overload.  I agree that her telemetry from emergency room on admission shows several hours of atrial fibrillation rapid ventricular response.  PLAN: Switch to oral diuretics.  Recheck renal function and potassium level tomorrow. Awaiting results of lower extremity venous Doppler. Continue incentive spirometry. I think we will be able to avoid right heart catheterization since she is improving.  Recommend starting direct oral anticoagulant.  Unfortunately all of the choices have interaction with Depakote.  Warfarin is considered but I think this particular patient would be very poorly suited for chronic warfarin therapy and the necessary dietary restrictions, close monitoring and follow-up.  In my opinion, it still probably safer for her to take Eliquis 5 mg twice daily rather than INR monitored warfarin.  Thurmon Fair, MD, [facility] [phone] 01/28/2022, 1:08 PM

## 2022-01-28 NOTE — Progress Notes (Signed)
Inpatient Diabetes Program Recommendations  AACE/ADA: New Consensus Statement on Inpatient Glycemic Control (2015)  Target Ranges:  Prepandial:   less than 140 mg/dL      Peak postprandial:   less than 180 mg/dL (1-2 hours)      Critically ill patients:  140 - 180 mg/dL   Lab Results  Component Value Date   GLUCAP 206 (H) 01/28/2022   HGBA1C 8.5 (H) 01/09/2022    Review of Glycemic Control  Diabetes history: DM2 Outpatient Diabetes medications: Tresiba 60 units QHS, metformin 1000 mg BID (not taking for past 2 weeks) Current orders for Inpatient glycemic control: Semglee 10 QHS, Farxiga 10 QD, Novolog 0-15 units TID with meals and 0-5 HS  HgbA1C - 8.5% CBGs today 214, 206 CBGs 7/4: 178-337   Inpatient Diabetes Program Recommendations:    Increase Semglee to 15 units QHS Add Novolog 4 units TID for meal coverage if eating > 50%  Continue to follow glucose trends.   Thank you. Ailene Ards, RD, LDN, CDE Inpatient Diabetes Coordinator [phone]

## 2022-01-28 NOTE — Plan of Care (Signed)
  Problem: Coping: Goal: Ability to adjust to condition or change in health will improve Outcome: Progressing   Problem: Tissue Perfusion: Goal: Adequacy of tissue perfusion will improve Outcome: Progressing   Problem: Clinical Measurements: Goal: Respiratory complications will improve Outcome: Progressing Goal: Cardiovascular complication will be avoided Outcome: Progressing

## 2022-01-28 NOTE — Discharge Instructions (Signed)
Information on my medicine - ELIQUIS® (apixaban) ° °This medication education was reviewed with me or my healthcare representative as part of my discharge preparation.  The pharmacist that spoke with me during my hospital stay was:  Crystalmarie Yasin R, RPH ° °Why was Eliquis® prescribed for you? °Eliquis® was prescribed for you to reduce the risk of a blood clot forming that can cause a stroke if you have a medical condition called atrial fibrillation (a type of irregular heartbeat). ° °What do You need to know about Eliquis® ? °Take your Eliquis® TWICE DAILY - one tablet in the morning and one tablet in the evening with or without food. If you have difficulty swallowing the tablet whole please discuss with your pharmacist how to take the medication safely. ° °Take Eliquis® exactly as prescribed by your doctor and DO NOT stop taking Eliquis® without talking to the doctor who prescribed the medication.  Stopping may increase your risk of developing a stroke.  Refill your prescription before you run out. ° °After discharge, you should have regular check-up appointments with your healthcare provider that is prescribing your Eliquis®.  In the future your dose may need to be changed if your kidney function or weight changes by a significant amount or as you get older. ° °What do you do if you miss a dose? °If you miss a dose, take it as soon as you remember on the same day and resume taking twice daily.  Do not take more than one dose of ELIQUIS at the same time to make up a missed dose. ° °Important Safety Information °A possible side effect of Eliquis® is bleeding. You should call your healthcare provider right away if you experience any of the following: °Bleeding from an injury or your nose that does not stop. °Unusual colored urine (red or dark brown) or unusual colored stools (red or black). °Unusual bruising for unknown reasons. °A serious fall or if you hit your head (even if there is no bleeding). ° °Some  medicines may interact with Eliquis® and might increase your risk of bleeding or clotting while on Eliquis®. To help avoid this, consult your healthcare provider or pharmacist prior to using any new prescription or non-prescription medications, including herbals, vitamins, non-steroidal anti-inflammatory drugs (NSAIDs) and supplements. ° °This website has more information on Eliquis® (apixaban): http://www.eliquis.com/eliquis/home  °

## 2022-01-28 NOTE — Progress Notes (Signed)
PROGRESS NOTE  Adrienne Bennett DJS:[id number] DOB: 06/02/1961 DOA: 01/23/2022 PCP: [name], MD   LOS: 5 days   Brief Narrative / Interim history: 61 year old female with history of COPD, PTSD/bipolar disorder, chronic diastolic CHF, DM 2, HTN, HLD comes into the hospital with shortness of breath.  She was recently hospitalized and discharged after a bout of COPD exacerbation and was placed on oxygen of 2 L at home.  On admission was felt to be fluid overloaded, started on diuresis and admitted to the hospital.  Cardiology consulted.  Subjective / 24h Interval events: Asking to go home.  Feels much better, but has not been walking in the hallway.  Also complains of left leg pain and worsening swelling on the left leg more than the right  Assesement and Plan: Principal Problem:   Acute on chronic diastolic CHF (congestive heart failure) (HCC) Active Problems:   Bipolar affective disorder (HCC)   Hyperlipidemia   GERD (gastroesophageal reflux disease)   Type II diabetes mellitus (HCC)   Acute on chronic respiratory failure with hypoxia and hypercapnia (HCC)   CKD (chronic kidney disease) stage 3, GFR 30-59 ml/min (HCC)   Principal problem Acute on chronic hypoxemic and hypercarbic respiratory failure in the setting of acute on chronic Diastolic CHF CHF-Patient presented with shortness of breath, bilateral leg swelling.  Last echo on 5/22 shows preserved ejection fraction with grade 2 diastolic dysfunction and aortic sclerosis without stenosis.  She was placed on IV diuresis, now net -5.9 L.  Cardiology consulted and following, switch to oral diuretics today due to slight bump in creatinine  Active problems Chest Pain-Resolved and she was having at the time of admission, possibly in the setting of fluid overload as well as bursts of A-fib  PAF-telemetry review from the ED shows several hours of A-fib.  Cardiology recommends Eliquis, started today  Left lower extremity swelling-obtain  Doppler to rule out DVT  Type 2 diabetes on long-term insulin with uncontrolled hyperglycemia and diabetic retinopathy-Last A1c 8.5%.  Blood sugar elevated upon arrival.  Continue sliding scale, monitor CBGs  CBG (last 3)  Recent Labs    01/28/22 0443 01/28/22 0738 01/28/22 1145  GLUCAP 214* 206* 223*    COPD with chronic hypoxic respiratory failure on 2 L of supplemental oxygen at home -continue home regimen.  Her oxygen requirements are fairly new since last month  CKD stage IIIa-baseline creatinine around 1.3-1.4, currently close to baseline  Hypertension -continue regimen as below.  Blood pressure controlled  History of bipolar disorder/PTSD/depression -At baseline, continue home meds Depakote, Klonopin, melatonin   Hyperlipidemia -Continue Atorvastatin 40 mg p.o. nightly   Hypokalemia, hyperkalemia-In the setting of diuresis, now potassium slightly up  Normocytic Anemia - Hemoglobin stable, no bleeding    Scheduled Meds:  amLODipine  10 mg Oral Daily   aspirin EC  81 mg Oral Daily   atorvastatin  40 mg Oral QHS   clonazePAM  1 mg Oral BID   dapagliflozin propanediol  10 mg Oral Daily   divalproex  250 mg Oral q morning   divalproex  500 mg Oral QHS   enoxaparin (LOVENOX) injection  40 mg Subcutaneous Q24H   fluticasone furoate-vilanterol  1 puff Inhalation Daily   furosemide  80 mg Oral Daily   insulin aspart  0-15 Units Subcutaneous TID WC   insulin aspart  0-5 Units Subcutaneous QHS   insulin glargine-yfgn  10 Units Subcutaneous QHS   melatonin  10 mg Oral QHS   metoprolol  tartrate  50 mg Oral BID   sodium chloride flush  3 mL Intravenous Q12H   umeclidinium bromide  1 puff Inhalation Daily   Continuous Infusions:  sodium chloride     PRN Meds:.sodium chloride, acetaminophen, albuterol, ipratropium-albuterol, ondansetron (ZOFRAN) IV, mouth rinse, saline, sodium chloride, sodium chloride flush  Diet Orders (From admission, onward)     Start     Ordered    01/23/22 1507  Diet heart healthy/carb modified Room service appropriate? Yes; Fluid consistency: Thin  Diet effective now       Question Answer Comment  Diet-HS Snack? Nothing   Room service appropriate? Yes   Fluid consistency: Thin      01/23/22 1506            DVT prophylaxis: enoxaparin (LOVENOX) injection 40 mg Start: 01/23/22 0800 SCDs Start: 01/23/22 0743   Lab Results  Component Value Date   PLT 261 01/28/2022      Code Status: Full Code  Family Communication: husband at bedside  Status is: Inpatient Remains inpatient appropriate because: monitor Cr   Level of care: Progressive  Consultants:  Cardiology   Objective: Vitals:   01/28/22 0845 01/28/22 0902 01/28/22 1149 01/28/22 1153  BP:  129/60 113/87 113/87  Pulse:  63 (!) 19 (!) 58  Resp:  (!) 21 19 19   Temp:   98.1 F (36.7 C) 98.1 F (36.7 C)  TempSrc:   Oral Oral  SpO2: 94%  98% 100%  Weight:      Height:        Intake/Output Summary (Last 24 hours) at 01/28/2022 1246 Last data filed at 01/28/2022 1100 Gross per 24 hour  Intake 360 ml  Output 4200 ml  Net -3840 ml   Wt Readings from Last 3 Encounters:  01/28/22 67.2 kg  01/19/22 70.9 kg  01/08/22 64.9 kg    Examination:  Constitutional: NAD Eyes: no scleral icterus ENMT: Mucous membranes are moist.  Neck: normal, supple Respiratory: clear to auscultation bilaterally, no wheezing, no crackles. Normal respiratory effort. No accessory muscle use.  Cardiovascular: Regular rate and rhythm, no murmurs / rubs / gallops. No LE edema. Good peripheral pulses Abdomen: non distended, no tenderness. Bowel sounds positive.  Musculoskeletal: no clubbing / cyanosis.  Skin: no rashes Neurologic: non focal    Data Reviewed: I have independently reviewed following labs and imaging studies  CBC Recent Labs  Lab 01/24/22 1600 01/25/22 0455 01/26/22 0403 01/27/22 0352 01/28/22 0420  WBC 9.6 8.0 9.8 8.0 10.0  HGB 11.8* 11.1* 11.0* 12.5 11.5*   HCT 38.5 35.3* 35.2* 40.0 36.5  PLT 205 207 241 213 261  MCV 99.5 96.2 96.7 95.7 95.3  MCH 30.5 30.2 30.2 29.9 30.0  MCHC 30.6 31.4 31.3 31.3 31.5  RDW 14.6 14.3 14.2 14.1 14.0  LYMPHSABS 2.9 2.7 2.8 2.2 2.6  MONOABS 0.7 0.9 1.2* 1.0 1.3*  EOSABS 0.2 0.2 0.2 0.1 0.1  BASOSABS 0.0 0.0 0.0 0.0 0.0    Recent Labs  Lab 01/23/22 0333 01/23/22 0743 01/23/22 0744 01/24/22 0445 01/24/22 1600 01/25/22 0455 01/26/22 0403 01/27/22 0352 01/28/22 0420  NA 133*  --   --  141  --  137 135 134* 136  K 4.6  --   --  3.0*  --  3.6 4.1 4.9 5.3*  CL 91*  --   --  95*  --  92* 95* 92* 90*  CO2 31  --   --  38*  --  36* 33* 29  36*  GLUCOSE 539*  --   --  86  --  206* 191* 208* 218*  BUN 25*  --   --  31*  --  45* 46* 50* 58*  CREATININE 1.08*   < >  --  1.26*  --  1.54* 1.16* 1.33* 1.55*  CALCIUM 8.9  --   --  9.0  --  8.5* 8.5* 8.9 9.4  AST  --   --   --   --  31 37 37 37 28  ALT  --   --   --   --  31 32 31 31 29   ALKPHOS  --   --   --   --  109 107 106 130* 118  BILITOT  --   --   --   --  0.6 0.6 0.5 0.8 0.5  ALBUMIN  --   --   --   --  2.9* 2.8* 3.0* 3.3* 3.3*  MG  --    < >  --   --  2.1 2.2 2.3 2.6* 2.7*  TSH  --   --  1.008  --   --   --   --   --   --   BNP 461.0*  --   --   --   --  185.9*  --   --   --    < > = values in this interval not displayed.    ------------------------------------------------------------------------------------------------------------------ No results for input(s): "CHOL", "HDL", "LDLCALC", "TRIG", "CHOLHDL", "LDLDIRECT" in the last 72 hours.  Lab Results  Component Value Date   HGBA1C 8.5 (H) 01/09/2022   ------------------------------------------------------------------------------------------------------------------ No results for input(s): "TSH", "T4TOTAL", "T3FREE", "THYROIDAB" in the last 72 hours.  Invalid input(s): "FREET3"  Cardiac Enzymes No results for input(s): "CKMB", "TROPONINI", "MYOGLOBIN" in the last 168 hours.  Invalid  input(s): "CK" ------------------------------------------------------------------------------------------------------------------    Component Value Date/Time   BNP 185.9 (H) 01/25/2022 0455    CBG: Recent Labs  Lab 01/27/22 2150 01/27/22 2352 01/28/22 0443 01/28/22 0738 01/28/22 1145  GLUCAP 337* 288* 214* 206* 223*    No results found for this or any previous visit (from the past 240 hour(s)).   Radiology Studies: DG Chest 2 View  Result Date: 01/28/2022 CLINICAL DATA:  Shortness of breath. EXAM: CHEST - 2 VIEW COMPARISON:  01/26/2022. FINDINGS: Mild improvement in small bilateral pleural effusions with overlying opacities. No visible pneumothorax. Enlarged cardiac silhouette. S-shaped thoracolumbar curvature with multilevel degenerative change. IMPRESSION: 1. Mild improvement in small bilateral pleural effusions with overlying atelectasis and/or consolidation. 2. Cardiomegaly. Electronically Signed   By: Margaretha Sheffield M.D.   On: 01/28/2022 10:30     Marzetta Board, MD, PhD [facility]  Between 7 am - 7 pm I am available, please contact me via Amion (for emergencies) or Securechat (non urgent messages)  Between 7 pm - 7 am I am not available, please contact night coverage MD/APP via Amion

## 2022-01-28 NOTE — Progress Notes (Signed)
Error

## 2022-01-29 ENCOUNTER — Encounter: Payer: Self-pay | Admitting: Internal Medicine

## 2022-01-29 ENCOUNTER — Other Ambulatory Visit: Payer: Self-pay | Admitting: Physician Assistant

## 2022-01-29 ENCOUNTER — Telehealth: Payer: Self-pay | Admitting: Nurse Practitioner

## 2022-01-29 DIAGNOSIS — I5032 Chronic diastolic (congestive) heart failure: Secondary | ICD-10-CM

## 2022-01-29 DIAGNOSIS — I5033 Acute on chronic diastolic (congestive) heart failure: Secondary | ICD-10-CM | POA: Diagnosis not present

## 2022-01-29 LAB — BASIC METABOLIC PANEL
Anion gap: 9 (ref 5–15)
BUN: 58 mg/dL — ABNORMAL HIGH (ref 8–23)
CO2: 36 mmol/L — ABNORMAL HIGH (ref 22–32)
Calcium: 8.9 mg/dL (ref 8.9–10.3)
Chloride: 87 mmol/L — ABNORMAL LOW (ref 98–111)
Creatinine, Ser: 1.49 mg/dL — ABNORMAL HIGH (ref 0.44–1.00)
GFR, Estimated: 40 mL/min — ABNORMAL LOW (ref >60)
Glucose, Bld: 269 mg/dL — ABNORMAL HIGH (ref 70–99)
Potassium: 5 mmol/L (ref 3.5–5.1)
Sodium: 132 mmol/L — ABNORMAL LOW (ref 135–145)

## 2022-01-29 LAB — GLUCOSE, CAPILLARY
Glucose-Capillary: 275 mg/dL — ABNORMAL HIGH (ref 70–99)
Glucose-Capillary: 268 mg/dL — ABNORMAL HIGH (ref 70–99)

## 2022-01-29 MED ORDER — FUROSEMIDE 80 MG PO TABS: 80.0000 mg | ORAL_TABLET | Freq: Every day | ORAL | 1 refills | Status: DC

## 2022-01-29 MED ORDER — GABAPENTIN 100 MG PO CAPS
100.0000 mg | ORAL_CAPSULE | Freq: Once | ORAL | Status: AC
  Administered 2022-01-29: 100 mg via ORAL
  Filled 2022-01-29: qty 1

## 2022-01-29 MED ORDER — APIXABAN 5 MG PO TABS: 5.0000 mg | ORAL_TABLET | Freq: Two times a day (BID) | ORAL | 0 refills | Status: DC

## 2022-01-29 MED ORDER — DAPAGLIFLOZIN PROPANEDIOL 10 MG PO TABS: 10.0000 mg | ORAL_TABLET | Freq: Every day | ORAL | 0 refills | Status: DC

## 2022-01-29 MED ORDER — INSULIN GLARGINE-YFGN 100 UNIT/ML ~~LOC~~ SOLN
15.0000 [IU] | Freq: Every day | SUBCUTANEOUS | Status: DC
  Filled 2022-01-29: qty 0.15

## 2022-01-29 MED ORDER — TRAMADOL HCL 50 MG PO TABS
50.0000 mg | ORAL_TABLET | Freq: Once | ORAL | Status: AC
  Administered 2022-01-29: 50 mg via ORAL
  Filled 2022-01-29: qty 1

## 2022-01-29 NOTE — Discharge Summary (Signed)
Physician Discharge Summary  Adrienne Bennett BDZ:[id number] DOB: 1961/07/19 DOA: 01/23/2022  PCP: [name], MD  Admit date: 01/23/2022 Discharge date: 01/29/2022  Admitted From: home Disposition:  home  Recommendations for Outpatient Follow-up:  Follow up with PCP in 1-2 weeks Please obtain BMP/CBC in one week  Home Health: none Equipment/Devices: home O2, (chronic)  Discharge Condition: stable CODE STATUS: Full code Diet Orders (From admission, onward)     Start     Ordered   01/23/22 1507  Diet heart healthy/carb modified Room service appropriate? Yes; Fluid consistency: Thin  Diet effective now       Question Answer Comment  Diet-HS Snack? Nothing   Room service appropriate? Yes   Fluid consistency: Thin      01/23/22 1506            HPI: Per admitting MD, Adrienne Bennett is a 62 y.o. female with medical history significant of pretension, hyperlipidemia, type 2 diabetes, diastolic congestive heart failure, COPD, PTSD/bipolar disorder/depression present here for the evaluation of difficulty in breathing. Patient reports that her symptoms started 2 weeks ago.  Her PCP recommended to use nebulizer at home which made her symptoms more worse.  She has dry cough, reports that she feels like fluid in her chest and associated with leg swelling.  Weight gain noted on recent PCP office visit.  Last night her symptoms started getting worse therefore her husband brought her to the ED for further evaluation and management. She denies chest pain, palpitations, wheezing, fever, chills, sputum production, recent sick contact, nausea, vomiting, diarrhea, any UTI symptoms, headache, blurry vision or poor appetite.  No history of tobacco abuse, alcohol abuse, illicit drug use.  She has been compliant with her home medications.  Lives with her husband at home.  Independent on daily life activities. Off note: Patient recently   Hospital Course / Discharge diagnoses: Principal Problem:    Acute on chronic diastolic CHF (congestive heart failure) (HCC) Active Problems:   Bipolar affective disorder (HCC)   Hyperlipidemia   GERD (gastroesophageal reflux disease)   Type II diabetes mellitus (HCC)   Acute on chronic respiratory failure with hypoxia and hypercapnia (HCC)   CKD (chronic kidney disease) stage 3, GFR 30-59 ml/min (HCC)   Principal problem Acute on chronic hypoxemic and hypercarbic respiratory failure in the setting of acute on chronic Diastolic CHF CHF-Patient presented with shortness of breath, bilateral leg swelling.  Last echo on 5/22 shows preserved ejection fraction with grade 2 diastolic dysfunction and aortic sclerosis without stenosis.  Cardiology consulted and followed patient while hospitalized.  She was placed on IV diuresis, with significant improvement in her respiratory status.  She was switched to p.o., tolerating it well, and will be discharged home in stable condition   Active problems Chest Pain-Resolved and she was having at the time of admission, possibly in the setting of fluid overload as well as bursts of A-fib PAF-telemetry review from the ED shows several hours of A-fib.  Evaluated by cardiology, started on Eliquis  Left lower extremity swelling-Doppler negative for DVT.  Swelling improved with diuresis  Type 2 diabetes on long-term insulin with uncontrolled hyperglycemia and diabetic retinopathy-Last A1c 8.5%.  Continue home insulin regimen COPD with chronic hypoxic respiratory failure on 2 L of supplemental oxygen at home -continue home regimen.  Her oxygen requirements are fairly new since last month CKD stage IIIa-baseline creatinine around 1.3-1.4, currently close to baseline Hypertension -continue regimen as below.  Blood pressure controlled History  of bipolar disorder/PTSD/depression -At baseline, continue home meds Hyperlipidemia -Continue Atorvastatin 40 mg p.o. nightly Hypokalemia-In the setting of diuresis, K now normalized   Normocytic Anemia - Hemoglobin stable, no bleeding  Sepsis ruled out   Discharge Instructions   Allergies as of 01/29/2022       Reactions   Benztropine Other (See Comments)   Caused word salad   Lactose Intolerance (gi) Diarrhea   Lisinopril Swelling   Dc 06-2015, had lip swelling, stomatitis   Other Other (See Comments)   Steroid given in hospital mid June 2023 cause purple rash that crept up arm - per MD  "Etiology of her rash was uncertain.  Rash was resolved at the time of discharge with Benadryl and cortisone cream"   Penicillins Swelling   Facial swelling   Xanax [alprazolam] Other (See Comments)   seizure   Monistat 3 Combo Pack App  [miconazole Nitrate] Itching, Rash        Medication List     STOP taking these medications    aspirin EC 81 MG tablet   cyclobenzaprine 10 MG tablet Commonly known as: FLEXERIL   doxycycline 100 MG tablet Commonly known as: VIBRA-TABS   meloxicam 7.5 MG tablet Commonly known as: MOBIC   metFORMIN 500 MG 24 hr tablet Commonly known as: GLUCOPHAGE-XR   predniSONE 10 MG tablet Commonly known as: DELTASONE       TAKE these medications    albuterol 108 (90 Base) MCG/ACT inhaler Commonly known as: VENTOLIN HFA Inhale 2 puffs into the lungs every 6 (six) hours as needed for wheezing or shortness of breath.   amLODipine 10 MG tablet Commonly known as: NORVASC TAKE 1 TABLET BY MOUTH  DAILY What changed: when to take this   apixaban 5 MG Tabs tablet Commonly known as: ELIQUIS Take 1 tablet (5 mg total) by mouth 2 (two) times daily.   atorvastatin 40 MG tablet Commonly known as: LIPITOR Take 1 tablet (40 mg total) by mouth at bedtime.   clonazePAM 1 MG tablet Commonly known as: KLONOPIN Take 1 mg by mouth 2 (two) times daily.  rx by psychiatry   dapagliflozin propanediol 10 MG Tabs tablet Commonly known as: FARXIGA Take 1 tablet (10 mg total) by mouth daily. Start taking on: January 30, 2022   divalproex 250 MG DR  tablet Commonly known as: DEPAKOTE Take 250-500 mg by mouth See admin instructions. Take one tablet (250 mg) by mouth every morning and two tablets (500 mg) at night   fluticasone furoate-vilanterol 100-25 MCG/ACT Aepb Commonly known as: Breo Ellipta Inhale 1 puff into the lungs daily. What changed: when to take this   furosemide 80 MG tablet Commonly known as: LASIX Take 1 tablet (80 mg total) by mouth daily. Start taking on: January 30, 2022 What changed:  medication strength how much to take when to take this   Incruse Ellipta 62.5 MCG/ACT Aepb Generic drug: umeclidinium bromide Inhale 1 puff into the lungs daily. What changed: when to take this   insulin degludec 200 UNIT/ML FlexTouch Pen Commonly known as: TRESIBA Inject 60 Units into the skin at bedtime.   ipratropium-albuterol 0.5-2.5 (3) MG/3ML Soln Commonly known as: DUONEB Take 3 mLs by nebulization 3 (three) times daily as needed. What changed: reasons to take this   ketoconazole 2 % cream Commonly known as: NIZORAL Apply 1 application topically 2 (two) times daily. For 10 days   Melatonin 10 MG Tabs Take 20 mg by mouth at bedtime.   metoprolol  tartrate 50 MG tablet Commonly known as: LOPRESSOR TAKE 1 TABLET BY MOUTH TWICE  DAILY   OXYGEN Inhale 2 L/min into the lungs continuous.   PreviDent 5000 Dry Mouth 1.1 % Gel dental gel Generic drug: sodium fluoride Place 1 Application onto teeth 2 (two) times daily.   tiZANidine 2 MG tablet Commonly known as: ZANAFLEX Take 1 tablet (2 mg total) by mouth 3 (three) times daily. What changed:  when to take this reasons to take this   Vitamin D (Ergocalciferol) 1.25 MG (50000 UNIT) Caps capsule Commonly known as: DRISDOL Take 1 capsule (50,000 Units total) by mouth every 7 (seven) days. What changed:  when to take this additional instructions         Consultations: Cardiology   Procedures/Studies:  VAS [place] LOWER EXTREMITY VENOUS (DVT)  Result Date:  01/28/2022  Lower Venous DVT Study Patient Name:  NATURE KUEKER  Date of Exam:   01/28/2022 Medical Rec #: [id number]         Accession #:    [id number] Date of Birth: 10-05-1960         Patient Gender: F Patient Age:   46 years Exam Location:  [facility] Procedure:      VAS [place] LOWER EXTREMITY VENOUS (DVT) Referring Phys: Pamella Pert --------------------------------------------------------------------------------  Indications: LLE pain & swelling.  Comparison Study: No previous exams Performing Technologist: Jody Hill RVT, RDMS  Examination Guidelines: A complete evaluation includes B-mode imaging, spectral Doppler, color Doppler, and power Doppler as needed of all accessible portions of each vessel. Bilateral testing is considered an integral part of a complete examination. Limited examinations for reoccurring indications may be performed as noted. The reflux portion of the exam is performed with the patient in reverse Trendelenburg.  +---------+---------------+---------+-----------+----------+--------------+ RIGHT    CompressibilityPhasicitySpontaneityPropertiesThrombus Aging +---------+---------------+---------+-----------+----------+--------------+ CFV      Full           Yes      Yes                                 +---------+---------------+---------+-----------+----------+--------------+ SFJ      Full                                                        +---------+---------------+---------+-----------+----------+--------------+ FV Prox  Full           Yes      Yes                                 +---------+---------------+---------+-----------+----------+--------------+ FV Mid   Full           Yes      Yes                                 +---------+---------------+---------+-----------+----------+--------------+ FV DistalFull           Yes      Yes                                 +---------+---------------+---------+-----------+----------+--------------+  PFV      Full                                                        +---------+---------------+---------+-----------+----------+--------------+  POP      Full           Yes      Yes                                 +---------+---------------+---------+-----------+----------+--------------+ PTV      Full                                                        +---------+---------------+---------+-----------+----------+--------------+ PERO     Full                                                        +---------+---------------+---------+-----------+----------+--------------+   +---------+---------------+---------+-----------+----------+-------------------+ LEFT     CompressibilityPhasicitySpontaneityPropertiesThrombus Aging      +---------+---------------+---------+-----------+----------+-------------------+ CFV      Full           Yes      Yes                                      +---------+---------------+---------+-----------+----------+-------------------+ SFJ      Full                                                             +---------+---------------+---------+-----------+----------+-------------------+ FV Prox  Full           Yes      Yes                                      +---------+---------------+---------+-----------+----------+-------------------+ FV Mid   Full           Yes      Yes                                      +---------+---------------+---------+-----------+----------+-------------------+ FV DistalFull           Yes      Yes                                      +---------+---------------+---------+-----------+----------+-------------------+ PFV      Full                                                             +---------+---------------+---------+-----------+----------+-------------------+ POP      Full           Yes      Yes                                       +---------+---------------+---------+-----------+----------+-------------------+  PTV      Full                                                             +---------+---------------+---------+-----------+----------+-------------------+ PERO     Full                                         Not well visualized +---------+---------------+---------+-----------+----------+-------------------+     Summary: BILATERAL: - No evidence of deep vein thrombosis seen in the lower extremities, bilaterally. -No evidence of popliteal cyst, bilaterally.   *See table(s) above for measurements and observations. Electronically signed by Harold Barban MD on 01/28/2022 at 5:58:49 PM.    Final    DG Chest 2 View  Result Date: 01/28/2022 CLINICAL DATA:  Shortness of breath. EXAM: CHEST - 2 VIEW COMPARISON:  01/26/2022. FINDINGS: Mild improvement in small bilateral pleural effusions with overlying opacities. No visible pneumothorax. Enlarged cardiac silhouette. S-shaped thoracolumbar curvature with multilevel degenerative change. IMPRESSION: 1. Mild improvement in small bilateral pleural effusions with overlying atelectasis and/or consolidation. 2. Cardiomegaly. Electronically Signed   By: Margaretha Sheffield M.D.   On: 01/28/2022 10:30   DG CHEST PORT 1 VIEW  Result Date: 01/26/2022 CLINICAL DATA:  CHF EXAM: PORTABLE CHEST 1 VIEW COMPARISON:  Chest radiograph from one day prior. FINDINGS: Left rotated chest radiograph stable cardiomediastinal silhouette with mild cardiomegaly. No pneumothorax. Stable small bilateral pleural effusions, left greater than right. Mild-to-moderate pulmonary edema common similar. Stable hazy bibasilar lung opacities, favor atelectasis. IMPRESSION: Stable mild-to-moderate congestive heart failure with small bilateral pleural effusions, left greater than right. Electronically Signed   By: Ilona Sorrel M.D.   On: 01/26/2022 08:28   US RENAL  Result Date: 01/25/2022 CLINICAL DATA:  Acute kidney  injury. EXAM: RENAL / URINARY TRACT ULTRASOUND COMPLETE COMPARISON:  CT, 04/29/2020. FINDINGS: Right Kidney: Renal measurements: 11.2 x 4.6 x 5.2 cm = volume: 140 mL. Normal parenchymal echogenicity. Cyst arises from the lower pole, 1.1 x 1.3 x 1.4 cm. No other masses, no stones and no hydronephrosis. Left Kidney: Renal measurements: 11.8 x 4.3 x 5.4 cm = volume: 144 mL. Normal parenchymal echogenicity. 2 masses, both consistent with cysts, upper pole measuring 1.7 x 1.5 x 1.7 cm and midpole measuring 1.5 x 1.1 x 1.3 cm. No other masses, no stones and no hydronephrosis. Bladder: Not visualized. Other: None. IMPRESSION: 1. No acute findings.  No hydronephrosis. 2. Single right and 2 left renal cysts. Electronically Signed   By: Lajean Manes M.D.   On: 01/25/2022 16:45   DG CHEST PORT 1 VIEW  Result Date: 01/25/2022 CLINICAL DATA:  Congestive heart failure. EXAM: PORTABLE CHEST 1 VIEW COMPARISON:  Radiographs 01/24/2022 and 01/23/2022.  CT 11/28/2020. FINDINGS: 0516 hours. Mild patient rotation to the left. Allowing for this, stable cardiomegaly and mediastinal contours. There are persistent bilateral pleural effusions with bibasilar airspace opacities. The pulmonary vascularity is less well-defined, suggesting mild superimposed edema. No pneumothorax. The bones appear unchanged. There is a convex right thoracic scoliosis. IMPRESSION: Compared with yesterday, possible mild superimposed pulmonary edema. The bibasilar airspace opacities and pleural effusions have not significantly changed. Electronically Signed   By: Richardean Sale M.D.   On: 01/25/2022 08:52  DG CHEST PORT 1 VIEW  Result Date: 01/24/2022 CLINICAL DATA:  61 year old female with history of shortness of breath. EXAM: PORTABLE CHEST 1 VIEW COMPARISON:  Chest x-ray 01/23/2022. FINDINGS: Lung volumes are low. Bibasilar opacities may reflect areas of atelectasis and/or consolidation with superimposed moderate bilateral pleural effusions. No  pneumothorax. No evidence of pulmonary edema. Heart size appears enlarged. The patient is rotated to the left on today's exam, resulting in distortion of the mediastinal contours and reduced diagnostic sensitivity and specificity for mediastinal pathology. IMPRESSION: 1. Extensive bibasilar areas of atelectasis and/or consolidation with superimposed moderate bilateral pleural effusions. 2. Cardiomegaly. Electronically Signed   By: Vinnie Langton M.D.   On: 01/24/2022 09:29   ECHOCARDIOGRAM COMPLETE  Result Date: 01/23/2022    ECHOCARDIOGRAM REPORT   Patient Name:   JULANE MORRICAL Date of Exam: 01/23/2022 Medical Rec #:  [id number]        Height:       61.0 in Accession #:    [id number]       Weight:       156.4 lb Date of Birth:  [birth year]        BSA:          1.701 m Patient Age:    72 years         BP:           130/108 mmHg Patient Gender: F                HR:           82 bpm. Exam Location:  Inpatient Procedure: 2D Echo, Cardiac Doppler, Color Doppler and Intracardiac            Opacification Agent Indications:    Congestive heart failure  History:        Patient has prior history of Echocardiogram examinations, most                 recent 12/09/2020. CHF, COPD; Risk Factors:Hypertension, Diabetes                 and HLD.  Sonographer:    Joette Catching RCS Referring Phys: [id number] [place]  1. Left ventricular ejection fraction, by estimation, is 60 to 65%. The left ventricle has normal function. The left ventricle has no regional wall motion abnormalities. There is mild left ventricular hypertrophy. Left ventricular diastolic parameters were normal.  2. Right ventricular systolic function is normal. The right ventricular size is normal.  3. Small mixed echo luscent area posterior to the AV groove may represent a benign pericardial cyst can consider CT scan to further evaluate but not likely clinically necessary.  4. The mitral valve is abnormal. Trivial mitral valve regurgitation. No  evidence of mitral stenosis.  5. Very mild stenosis by valve area mean gradient only 8 and DVI 0.68. The aortic valve is normal in structure. There is moderate calcification of the aortic valve. There is moderate thickening of the aortic valve. Aortic valve regurgitation is mild. Mild aortic valve stenosis.  6. The inferior vena cava is normal in size with greater than 50% respiratory variability, suggesting right atrial pressure of 3 mmHg. FINDINGS  Left Ventricle: Left ventricular ejection fraction, by estimation, is 60 to 65%. The left ventricle has normal function. The left ventricle has no regional wall motion abnormalities. Definity contrast agent was given IV to delineate the left ventricular  endocardial borders. The left ventricular internal cavity size was normal in size. There  is mild left ventricular hypertrophy. Left ventricular diastolic parameters were normal. Right Ventricle: The right ventricular size is normal. No increase in right ventricular wall thickness. Right ventricular systolic function is normal. Left Atrium: Left atrial size was normal in size. Right Atrium: Right atrial size was normal in size. Pericardium: Small mixed echo luscent area posterior to the AV groove may represent a benign pericardial cyst can consider CT scan to further evaluate but not likely clinically necessary. There is no evidence of pericardial effusion. Mitral Valve: The mitral valve is abnormal. There is mild thickening of the mitral valve leaflet(s). There is mild calcification of the mitral valve leaflet(s). Trivial mitral valve regurgitation. No evidence of mitral valve stenosis. Tricuspid Valve: The tricuspid valve is normal in structure. Tricuspid valve regurgitation is not demonstrated. No evidence of tricuspid stenosis. Aortic Valve: Very mild stenosis by valve area mean gradient only 8 and DVI 0.68. The aortic valve is normal in structure. There is moderate calcification of the aortic valve. There is  moderate thickening of the aortic valve. Aortic valve regurgitation is mild. Aortic regurgitation PHT measures 589 msec. Mild aortic stenosis is present. Aortic valve mean gradient measures 8.0 mmHg. Aortic valve peak gradient measures 14.5 mmHg. Aortic valve area, by VTI measures 1.92 cm. Pulmonic Valve: The pulmonic valve was normal in structure. Pulmonic valve regurgitation is mild. No evidence of pulmonic stenosis. Aorta: The aortic root is normal in size and structure. Venous: The inferior vena cava is normal in size with greater than 50% respiratory variability, suggesting right atrial pressure of 3 mmHg. IAS/Shunts: No atrial level shunt detected by color flow Doppler.  LEFT VENTRICLE PLAX 2D LVIDd:         4.15 cm     Diastology LVIDs:         2.90 cm     LV e' medial:    6.74 cm/s LV PW:         1.10 cm     LV E/e' medial:  17.1 LV IVS:        1.20 cm     LV e' lateral:   9.14 cm/s LVOT diam:     1.90 cm     LV E/e' lateral: 12.6 LV SV:         71 LV SV Index:   42 LVOT Area:     2.84 cm  LV Volumes (MOD) LV vol d, MOD A2C: 93.3 ml LV vol d, MOD A4C: 91.7 ml LV vol s, MOD A2C: 20.6 ml LV vol s, MOD A4C: 29.0 ml LV SV MOD A2C:     72.7 ml LV SV MOD A4C:     91.7 ml LV SV MOD BP:      67.2 ml RIGHT VENTRICLE          IVC RV Basal diam:  3.20 cm  IVC diam: 1.70 cm RV Mid diam:    2.20 cm LEFT ATRIUM             Index        RIGHT ATRIUM           Index LA diam:        3.50 cm 2.06 cm/m   RA Area:     10.10 cm LA Vol (A2C):   44.1 ml 25.92 ml/m  RA Volume:   16.20 ml  9.52 ml/m LA Vol (A4C):   26.2 ml 15.40 ml/m LA Biplane Vol: 35.5 ml 20.87 ml/m  AORTIC VALVE  PULMONIC VALVE AV Area (Vmax):    1.91 cm      PV Vmax:          1.09 m/s AV Area (Vmean):   1.88 cm      PV Peak grad:     4.8 mmHg AV Area (VTI):     1.92 cm      PR End Diast Vel: 6.04 msec AV Vmax:           190.50 cm/s AV Vmean:          128.500 cm/s AV VTI:            0.368 m AV Peak Grad:      14.5 mmHg AV Mean Grad:       8.0 mmHg LVOT Vmax:         128.50 cm/s LVOT Vmean:        85.050 cm/s LVOT VTI:          0.250 m LVOT/AV VTI ratio: 0.68 AI PHT:            589 msec  AORTA Ao Root diam: 3.20 cm Ao Asc diam:  3.20 cm MITRAL VALVE                TRICUSPID VALVE MV Area (PHT): 4.71 cm     TR Peak grad:   26.0 mmHg MV Decel Time: 161 msec     TR Vmax:        255.00 cm/s MV E velocity: 115.00 cm/s MV A velocity: 99.80 cm/s   SHUNTS MV E/A ratio:  1.15         Systemic VTI:  0.25 m                             Systemic Diam: 1.90 cm Jenkins Rouge MD Electronically signed by Jenkins Rouge MD Signature Date/Time: 01/23/2022/2:27:02 PM    Final    DG Chest Port 1 View  Result Date: 01/23/2022 CLINICAL DATA:  Increasing shortness of breath EXAM: PORTABLE CHEST 1 VIEW COMPARISON:  01/08/2022 FINDINGS: Shallow inspiration. Increasing bilateral pleural effusions and basilar infiltrates since prior study. Heart size appears enlarged but is obscured by the parenchymal process. No pneumothorax. Patient rotation limits examination. IMPRESSION: Increasing bilateral pleural effusions and basilar infiltrates since prior study. Electronically Signed   By: Lucienne Capers M.D.   On: 01/23/2022 03:52   DG Chest Port 1 View  Result Date: 01/08/2022 CLINICAL DATA:  Dyspnea, congestive heart failure EXAM: PORTABLE CHEST 1 VIEW COMPARISON:  11/27/2020 FINDINGS: The lungs are symmetrically well expanded. Mild bilateral perihilar interstitial pulmonary infiltrate is present in keeping with changes of mild cardiogenic failure. No pneumothorax. Small left pleural effusion is not excluded. Mild cardiomegaly is stable. No acute bone abnormality IMPRESSION: Mild cardiogenic failure. Electronically Signed   By: Fidela Salisbury M.D.   On: 01/08/2022 01:42     Subjective: - no chest pain, shortness of breath, no abdominal pain, nausea or vomiting.   Discharge Exam: BP 127/73 (BP Location: Left Arm)   Pulse 65   Temp 100.3 F (37.9 C) (Oral)   Resp  19   Ht 5\' 1"  (1.549 m)   Wt 67.2 kg   LMP 08/24/2015 (Exact Date)   SpO2 96%   BMI 27.99 kg/m   General: Pt is alert, awake, not in acute distress Cardiovascular: RRR, S1/S2 +, no rubs, no gallops Respiratory: CTA bilaterally, no wheezing, no rhonchi Abdominal:  Soft, NT, ND, bowel sounds + Extremities: no edema, no cyanosis   The results of significant diagnostics from this hospitalization (including imaging, microbiology, ancillary and laboratory) are listed below for reference.     Microbiology: No results found for this or any previous visit (from the past 240 hour(s)).   Labs: Basic Metabolic Panel: Recent Labs  Lab 01/24/22 1600 01/25/22 0455 01/26/22 0403 01/27/22 0352 01/28/22 0420 01/29/22 0406  NA  --  137 135 134* 136 132*  K  --  3.6 4.1 4.9 5.3* 5.0  CL  --  92* 95* 92* 90* 87*  CO2  --  36* 33* 29 36* 36*  GLUCOSE  --  206* 191* 208* 218* 269*  BUN  --  45* 46* 50* 58* 58*  CREATININE  --  1.54* 1.16* 1.33* 1.55* 1.49*  CALCIUM  --  8.5* 8.5* 8.9 9.4 8.9  MG 2.1 2.2 2.3 2.6* 2.7*  --   PHOS 5.2* 5.5* 4.7* 5.0* 6.8*  --    Liver Function Tests: Recent Labs  Lab 01/24/22 1600 01/25/22 0455 01/26/22 0403 01/27/22 0352 01/28/22 0420  AST 31 37 37 37 28  ALT 31 32 31 31 29   ALKPHOS 109 107 106 130* 118  BILITOT 0.6 0.6 0.5 0.8 0.5  PROT 6.9 6.8 7.0 7.7 7.8  ALBUMIN 2.9* 2.8* 3.0* 3.3* 3.3*   CBC: Recent Labs  Lab 01/24/22 1600 01/25/22 0455 01/26/22 0403 01/27/22 0352 01/28/22 0420  WBC 9.6 8.0 9.8 8.0 10.0  NEUTROABS 5.7 4.2 5.5 4.6 5.8  HGB 11.8* 11.1* 11.0* 12.5 11.5*  HCT 38.5 35.3* 35.2* 40.0 36.5  MCV 99.5 96.2 96.7 95.7 95.3  PLT 205 207 241 213 261   CBG: Recent Labs  Lab 01/28/22 0738 01/28/22 1145 01/28/22 1620 01/28/22 2111 01/29/22 0723  GLUCAP 206* 223* 175* 327* 275*   Hgb A1c No results for input(s): "HGBA1C" in the last 72 hours. Lipid Profile No results for input(s): "CHOL", "HDL", "LDLCALC", "TRIG",  "CHOLHDL", "LDLDIRECT" in the last 72 hours. Thyroid function studies No results for input(s): "TSH", "T4TOTAL", "T3FREE", "THYROIDAB" in the last 72 hours.  Invalid input(s): "FREET3" Urinalysis    Component Value Date/Time   COLORURINE STRAW (A) 01/25/2022 1259   APPEARANCEUR CLEAR 01/25/2022 1259   LABSPEC 1.010 01/25/2022 1259   PHURINE 6.0 01/25/2022 1259   GLUCOSEU >=500 (A) 01/25/2022 1259   HGBUR NEGATIVE 01/25/2022 1259   BILIRUBINUR NEGATIVE 01/25/2022 1259   KETONESUR NEGATIVE 01/25/2022 1259   PROTEINUR 30 (A) 01/25/2022 1259   UROBILINOGEN 0.2 09/01/2013 1128   NITRITE NEGATIVE 01/25/2022 1259   LEUKOCYTESUR SMALL (A) 01/25/2022 1259    FURTHER DISCHARGE INSTRUCTIONS:   Get Medicines reviewed and adjusted: Please take all your medications with you for your next visit with your Primary MD   Laboratory/radiological data: Please request your Primary MD to go over all hospital tests and procedure/radiological results at the follow up, please ask your Primary MD to get all Hospital records sent to his/her office.   In some cases, they will be blood work, cultures and biopsy results pending at the time of your discharge. Please request that your primary care M.D. goes through all the records of your hospital data and follows up on these results.   Also Note the following: If you experience worsening of your admission symptoms, develop shortness of breath, life threatening emergency, suicidal or homicidal thoughts you must seek medical attention immediately by calling 911 or calling your MD immediately  if  symptoms less severe.   You must read complete instructions/literature along with all the possible adverse reactions/side effects for all the Medicines you take and that have been prescribed to you. Take any new Medicines after you have completely understood and accpet all the possible adverse reactions/side effects.    Do not drive when taking Pain medications or  sleeping medications (Benzodaizepines)   Do not take more than prescribed Pain, Sleep and Anxiety Medications. It is not advisable to combine anxiety,sleep and pain medications without talking with your primary care practitioner   Special Instructions: If you have smoked or chewed Tobacco  in the last 2 yrs please stop smoking, stop any regular Alcohol  and or any Recreational drug use.   Wear Seat belts while driving.   Please note: You were cared for by a hospitalist during your hospital stay. Once you are discharged, your primary care physician will handle any further medical issues. Please note that NO REFILLS for any discharge medications will be authorized once you are discharged, as it is imperative that you return to your primary care physician (or establish a relationship with a primary care physician if you do not have one) for your post hospital discharge needs so that they can reassess your need for medications and monitor your lab values.  Time coordinating discharge: 40 minutes  SIGNED:  Marzetta Board, MD, PhD 01/29/2022, 11:19 AM

## 2022-01-29 NOTE — Telephone Encounter (Signed)
Pt is scheduled for TOC with Eligha Bridegroom, NP on 07/14 at 10:05 a.m. Scheduled by Theodore Demark, PA

## 2022-01-29 NOTE — Progress Notes (Signed)
Progress Note  Patient Name: Adrienne Bennett Date of Encounter: 01/29/2022  [facility] Cardiologist: [name], MD   Subjective   Lying fully horizontally in bed without respiratory difficulty. No edema. No DVT on ultrasound yesterday.  Inpatient Medications    Scheduled Meds:  amLODipine  10 mg Oral Daily   apixaban  5 mg Oral BID   atorvastatin  40 mg Oral QHS   clonazePAM  1 mg Oral BID   dapagliflozin propanediol  10 mg Oral Daily   divalproex  250 mg Oral q morning   divalproex  500 mg Oral QHS   fluticasone furoate-vilanterol  1 puff Inhalation Daily   furosemide  80 mg Oral Daily   insulin aspart  0-15 Units Subcutaneous TID WC   insulin aspart  0-5 Units Subcutaneous QHS   insulin glargine-yfgn  15 Units Subcutaneous QHS   melatonin  10 mg Oral QHS   metoprolol tartrate  50 mg Oral BID   sodium chloride flush  3 mL Intravenous Q12H   umeclidinium bromide  1 puff Inhalation Daily   Continuous Infusions:  sodium chloride     PRN Meds: sodium chloride, acetaminophen, albuterol, ipratropium-albuterol, ondansetron (ZOFRAN) IV, mouth rinse, saline, sodium chloride, sodium chloride flush   Vital Signs    Vitals:   01/28/22 2029 01/29/22 0444 01/29/22 0800 01/29/22 0834  BP: (!) 153/63 127/73    Pulse: 74 65    Resp: 18 20 (!) 21 19  Temp: 99.1 F (37.3 C) 100.3 F (37.9 C)    TempSrc: Oral Oral    SpO2: 96% 100%  96%  Weight:      Height:        Intake/Output Summary (Last 24 hours) at 01/29/2022 1051 Last data filed at 01/29/2022 0900 Gross per 24 hour  Intake 480 ml  Output 2100 ml  Net -1620 ml      01/28/2022    5:00 AM 01/27/2022    5:00 AM 01/26/2022    4:38 AM  Last 3 Weights  Weight (lbs) 148 lb 2.4 oz 151 lb 7.3 oz 148 lb 2.4 oz  Weight (kg) 67.2 kg 68.7 kg 67.2 kg      Telemetry    Normal sinus rhythm- Personally Reviewed  ECG    No new tracing- Personally Reviewed  Physical Exam  Lying supine, not tachypneic GEN: No  acute distress.   Neck: No JVD Cardiac: RRR, no murmurs, rubs, or gallops.  Respiratory: Clear to auscultation bilaterally. GI: Soft, nontender, non-distended  MS: No edema; No deformity. Neuro:  Nonfocal  Psych: Normal affect   Labs    High Sensitivity Troponin:   Recent Labs  Lab 01/08/22 0129  TROPONINIHS 8     Chemistry Recent Labs  Lab 01/26/22 0403 01/27/22 0352 01/28/22 0420 01/29/22 0406  NA 135 134* 136 132*  K 4.1 4.9 5.3* 5.0  CL 95* 92* 90* 87*  CO2 33* 29 36* 36*  GLUCOSE 191* 208* 218* 269*  BUN 46* 50* 58* 58*  CREATININE 1.16* 1.33* 1.55* 1.49*  CALCIUM 8.5* 8.9 9.4 8.9  MG 2.3 2.6* 2.7*  --   PROT 7.0 7.7 7.8  --   ALBUMIN 3.0* 3.3* 3.3*  --   AST 37 37 28  --   ALT 31 31 29   --   ALKPHOS 106 130* 118  --   BILITOT 0.5 0.8 0.5  --   GFRNONAA 54* 46* 38* 40*  ANIONGAP 7 13 10  9  Lipids No results for input(s): "CHOL", "TRIG", "HDL", "LABVLDL", "LDLCALC", "CHOLHDL" in the last 168 hours.  Hematology Recent Labs  Lab 01/26/22 0403 01/27/22 0352 01/28/22 0420  WBC 9.8 8.0 10.0  RBC 3.64* 4.18 3.83*  HGB 11.0* 12.5 11.5*  HCT 35.2* 40.0 36.5  MCV 96.7 95.7 95.3  MCH 30.2 29.9 30.0  MCHC 31.3 31.3 31.5  RDW 14.2 14.1 14.0  PLT 241 213 261   Thyroid  Recent Labs  Lab 01/23/22 0744  TSH 1.008    BNP Recent Labs  Lab 01/23/22 0333 01/25/22 0455  BNP 461.0* 185.9*    DDimer No results for input(s): "DDIMER" in the last 168 hours.   Radiology    VAS [place] LOWER EXTREMITY VENOUS (DVT)  Result Date: 01/28/2022  Lower Venous DVT Study Patient Name:  Adrienne Bennett  Date of Exam:   01/28/2022 Medical Rec #: [id number]         Accession #:    [id number] Date of Birth: June 08, 1961         Patient Gender: F Patient Age:   61 years Exam Location:  [facility] Procedure:      VAS [place] LOWER EXTREMITY VENOUS (DVT) Referring Phys: Pamella Pert --------------------------------------------------------------------------------  Indications: LLE  pain & swelling.  Comparison Study: No previous exams Performing Technologist: Jody Hill RVT, RDMS  Examination Guidelines: A complete evaluation includes B-mode imaging, spectral Doppler, color Doppler, and power Doppler as needed of all accessible portions of each vessel. Bilateral testing is considered an integral part of a complete examination. Limited examinations for reoccurring indications may be performed as noted. The reflux portion of the exam is performed with the patient in reverse Trendelenburg.  +---------+---------------+---------+-----------+----------+--------------+ RIGHT    CompressibilityPhasicitySpontaneityPropertiesThrombus Aging +---------+---------------+---------+-----------+----------+--------------+ CFV      Full           Yes      Yes                                 +---------+---------------+---------+-----------+----------+--------------+ SFJ      Full                                                        +---------+---------------+---------+-----------+----------+--------------+ FV Prox  Full           Yes      Yes                                 +---------+---------------+---------+-----------+----------+--------------+ FV Mid   Full           Yes      Yes                                 +---------+---------------+---------+-----------+----------+--------------+ FV DistalFull           Yes      Yes                                 +---------+---------------+---------+-----------+----------+--------------+ PFV      Full                                                        +---------+---------------+---------+-----------+----------+--------------+  POP      Full           Yes      Yes                                 +---------+---------------+---------+-----------+----------+--------------+ PTV      Full                                                         +---------+---------------+---------+-----------+----------+--------------+ PERO     Full                                                        +---------+---------------+---------+-----------+----------+--------------+   +---------+---------------+---------+-----------+----------+-------------------+ LEFT     CompressibilityPhasicitySpontaneityPropertiesThrombus Aging      +---------+---------------+---------+-----------+----------+-------------------+ CFV      Full           Yes      Yes                                      +---------+---------------+---------+-----------+----------+-------------------+ SFJ      Full                                                             +---------+---------------+---------+-----------+----------+-------------------+ FV Prox  Full           Yes      Yes                                      +---------+---------------+---------+-----------+----------+-------------------+ FV Mid   Full           Yes      Yes                                      +---------+---------------+---------+-----------+----------+-------------------+ FV DistalFull           Yes      Yes                                      +---------+---------------+---------+-----------+----------+-------------------+ PFV      Full                                                             +---------+---------------+---------+-----------+----------+-------------------+ POP      Full           Yes      Yes                                      +---------+---------------+---------+-----------+----------+-------------------+  PTV      Full                                                             +---------+---------------+---------+-----------+----------+-------------------+ PERO     Full                                         Not well visualized +---------+---------------+---------+-----------+----------+-------------------+     Summary:  BILATERAL: - No evidence of deep vein thrombosis seen in the lower extremities, bilaterally. -No evidence of popliteal cyst, bilaterally.   *See table(s) above for measurements and observations. Electronically signed by Harold Barban MD on 01/28/2022 at 5:58:49 PM.    Final    DG Chest 2 View  Result Date: 01/28/2022 CLINICAL DATA:  Shortness of breath. EXAM: CHEST - 2 VIEW COMPARISON:  01/26/2022. FINDINGS: Mild improvement in small bilateral pleural effusions with overlying opacities. No visible pneumothorax. Enlarged cardiac silhouette. S-shaped thoracolumbar curvature with multilevel degenerative change. IMPRESSION: 1. Mild improvement in small bilateral pleural effusions with overlying atelectasis and/or consolidation. 2. Cardiomegaly. Electronically Signed   By: Margaretha Sheffield M.D.   On: 01/28/2022 10:30    Cardiac Studies   Echo 01/23/2022  1. Left ventricular ejection fraction, by estimation, is 60 to 65%. The  left ventricle has normal function. The left ventricle has no regional  wall motion abnormalities. There is mild left ventricular hypertrophy.  Left ventricular diastolic parameters  were normal.   2. Right ventricular systolic function is normal. The right ventricular  size is normal.   3. Small mixed echo luscent area posterior to the AV groove may represent  a benign pericardial cyst can consider CT scan to further evaluate but not  likely clinically necessary.   4. The mitral valve is abnormal. Trivial mitral valve regurgitation. No  evidence of mitral stenosis.   5. Very mild stenosis by valve area mean gradient only 8 and DVI 0.68.  The aortic valve is normal in structure. There is moderate calcification  of the aortic valve. There is moderate thickening of the aortic valve.  Aortic valve regurgitation is mild.  Mild aortic valve stenosis.   6. The inferior vena cava is normal in size with greater than 50%  respiratory variability, suggesting right atrial pressure of 3  mmHg.     Patient Profile     61 y.o. female with PMH of HTN, HLD, DM II, HFpEF, COPD, bipolar who was recently admitted with COPD exacerbation on 6/15 returned with worsening dyspnea and chest pain. CXR showed likely pulm edema. Underwent IV diuresis, breathing improved, so did chest pain.  Had several hours of paroxysmal atrial fibrillation on day of admission, and normal sinus rhythm since then.  Assessment & Plan    CHF: As far as I can tell she is now clinically euvolemic.  She has not been weighed today.  Important to weigh before discharge with hospital scale and again with her scale as soon as she gets home and we will establish that as her "dry weight".  Should call our office if she gains more than 3 pounds in 24 hours or at any point gains 5 pounds compared to that "dry weight".  Has been  started on SGLT2 inhibitor (Farxiga 10 mg daily) on this admission.  On current dose of oral diuretic renal function has plateaued and may be slightly improved today.  Will discharge home on furosemide 80 mg once daily rather than 40 mg twice daily, for her convenience. AFib: Several hours of paroxysmal atrial fibrillation on arrival, but now with several days of normal rhythm.  High likelihood of arrhythmia recurrence due to chronic cardiac and pulmonary illnesses.  Will prescribe long-term anticoagulation.  Despite potential interaction with Depakote, I still think Eliquis is a better choice than warfarin. [facility] will sign off.   Medication Recommendations:  - Eliquis 5 mg twice daily (new) - Farxiga 10 mg once daily (new, Rx for CHF) - Furosemide 80 mg once daily - continue previous doses of metoprolol, amlodipine, atorvastatin -Stop meloxicam and avoid NSAIDs in general Other recommendations (labs, testing, etc): Basic metabolic panel in 1 week.  Sodium restricted diet, daily weight monitoring, report 3 pound weight gain in 24 hours or anytime she exceeds current weight by 5 pounds.  Avoid  use of NSAIDs (stop meloxicam). Follow up as an outpatient: We will schedule follow-up appointment.  For questions or updates, please contact [place] Please consult [email or web address] for contact info under        Signed, Sanda Klein, MD  01/29/2022, 10:51 AM

## 2022-01-29 NOTE — Progress Notes (Signed)
Patient and spouse, Adrienne Bennett, have been read discharge instructions and patient and spouse have no further questions at this time. IV of the right arm has been removed; site is clean, dry and intact.  Sinclair Ship, RN

## 2022-01-30 ENCOUNTER — Encounter: Payer: Self-pay | Admitting: Internal Medicine

## 2022-01-30 ENCOUNTER — Encounter (HOSPITAL_BASED_OUTPATIENT_CLINIC_OR_DEPARTMENT_OTHER): Payer: Self-pay | Admitting: Emergency Medicine

## 2022-01-30 ENCOUNTER — Ambulatory Visit (INDEPENDENT_AMBULATORY_CARE_PROVIDER_SITE_OTHER): Payer: Medicare Other | Admitting: Internal Medicine

## 2022-01-30 ENCOUNTER — Emergency Department (HOSPITAL_BASED_OUTPATIENT_CLINIC_OR_DEPARTMENT_OTHER): Payer: Medicare Other

## 2022-01-30 ENCOUNTER — Emergency Department (HOSPITAL_BASED_OUTPATIENT_CLINIC_OR_DEPARTMENT_OTHER)
Admission: EM | Admit: 2022-01-30 | Discharge: 2022-01-30 | Disposition: A | Discharge status: Home / Self Care | Payer: Medicare Other | Attending: Emergency Medicine | Admitting: Emergency Medicine

## 2022-01-30 ENCOUNTER — Other Ambulatory Visit: Payer: Self-pay

## 2022-01-30 VITALS — BP 138/62 | HR 72 | Temp 98.0°F | Resp 18 | Ht 61.0 in

## 2022-01-30 DIAGNOSIS — M79605 Pain in left leg: Secondary | ICD-10-CM

## 2022-01-30 DIAGNOSIS — R040 Epistaxis: Secondary | ICD-10-CM | POA: Diagnosis not present

## 2022-01-30 DIAGNOSIS — Z79899 Other long term (current) drug therapy: Secondary | ICD-10-CM | POA: Diagnosis not present

## 2022-01-30 DIAGNOSIS — I480 Paroxysmal atrial fibrillation: Secondary | ICD-10-CM

## 2022-01-30 DIAGNOSIS — R739 Hyperglycemia, unspecified: Secondary | ICD-10-CM | POA: Diagnosis not present

## 2022-01-30 DIAGNOSIS — Z794 Long term (current) use of insulin: Secondary | ICD-10-CM | POA: Diagnosis not present

## 2022-01-30 DIAGNOSIS — Z7901 Long term (current) use of anticoagulants: Secondary | ICD-10-CM | POA: Diagnosis not present

## 2022-01-30 DIAGNOSIS — M25572 Pain in left ankle and joints of left foot: Secondary | ICD-10-CM | POA: Diagnosis not present

## 2022-01-30 LAB — COMPREHENSIVE METABOLIC PANEL
ALT: 26 U/L (ref 0–44)
AST: 37 U/L (ref 15–41)
Albumin: 3.5 g/dL (ref 3.5–5.0)
Alkaline Phosphatase: 114 U/L (ref 38–126)
Anion gap: 8 (ref 5–15)
BUN: 46 mg/dL — ABNORMAL HIGH (ref 8–23)
CO2: 37 mmol/L — ABNORMAL HIGH (ref 22–32)
Calcium: 9.2 mg/dL (ref 8.9–10.3)
Chloride: 85 mmol/L — ABNORMAL LOW (ref 98–111)
Creatinine, Ser: 1.52 mg/dL — ABNORMAL HIGH (ref 0.44–1.00)
GFR, Estimated: 39 mL/min — ABNORMAL LOW (ref >60)
Glucose, Bld: 392 mg/dL — ABNORMAL HIGH (ref 70–99)
Potassium: 4.7 mmol/L (ref 3.5–5.1)
Sodium: 130 mmol/L — ABNORMAL LOW (ref 135–145)
Total Bilirubin: 0.4 mg/dL (ref 0.3–1.2)
Total Protein: 8.9 g/dL — ABNORMAL HIGH (ref 6.5–8.1)

## 2022-01-30 LAB — CBC
HCT: 37.5 % (ref 36.0–46.0)
Hemoglobin: 12 g/dL (ref 12.0–15.0)
MCH: 30.4 pg (ref 26.0–34.0)
MCHC: 32 g/dL (ref 30.0–36.0)
MCV: 94.9 fL (ref 80.0–100.0)
Platelets: 363 10*3/uL (ref 150–400)
RBC: 3.95 MIL/uL (ref 3.87–5.11)
RDW: 13.7 % (ref 11.5–15.5)
WBC: 8.4 10*3/uL (ref 4.0–10.5)
nRBC: 0 % (ref 0.0–0.2)

## 2022-01-30 MED ORDER — OXYMETAZOLINE HCL 0.05 % NA SOLN
1.0000 | Freq: Once | NASAL | Status: AC
  Administered 2022-01-30: 1 via NASAL
  Filled 2022-01-30: qty 30

## 2022-01-30 MED ORDER — OXYCODONE HCL 5 MG PO TABS
5.0000 mg | ORAL_TABLET | Freq: Once | ORAL | Status: AC
  Administered 2022-01-30: 5 mg via ORAL
  Filled 2022-01-30: qty 1

## 2022-01-30 MED ORDER — ONDANSETRON 4 MG PO TBDP
4.0000 mg | ORAL_TABLET | Freq: Once | ORAL | Status: AC
  Administered 2022-01-30: 4 mg via ORAL
  Filled 2022-01-30: qty 1

## 2022-01-30 NOTE — ED Notes (Signed)
Pt. Has blood noted in each nostril and reports her L leg at the ankle hurting.  Pt. States she just got out of the hospital and was placed on a blood thinner.  Pt. Has controlled bleeding from her nose.  Pt. Has oxygen on and sats are in the upper 90s at present time.  Pt. Has a very flat affect when talking and discussing her health care.

## 2022-01-30 NOTE — Progress Notes (Unsigned)
Subjective:    Patient ID: Adrienne Bennett, female    DOB: [birth year], 61 y.o.   MRN: [id number]  DOS:  01/30/2022 Type of visit - description: acute, hospital f/u  Last seen here 01/19/2022 for hospital follow-up Since then she was readmitted to the hospital on discharge 01/29/2022 (yesterday). Diagnosis at time of discharge: Acute on chronic diastolic CHF Acute on chronic hypoxemic and hypercarbic respiratory failure. During the admission, atrial fibrillation was noted, she was a started on Eliquis. Lower extremity edema: [place] negative for DVT.  Swelling improved with diuresis.  She is coming acutely today because nosebleeds and L leg pain.  Unknown to me, she is having on and off left-sided nosebleeds for several weeks. During the admission, Eliquis was started. Nosebleed has been more severe.  Current episode started at 9 AM and she has not been able to stop it.  Also, has severe left leg pain that started approximately 2 days ago while she was still in the hospital. Pain is located in the "whole leg". Ultrasound was done in-house >>  no DVT. Is worse when she puts pressure on the leg (standing) She denies back pain but admits to some numbness and tingling of the left leg.  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    Essential hypertension  09/04/2010   Qualifier: Diagnosis of  By: [name] MD, [name].    GANGLION CYST 07/22/2010   Qualifier: Diagnosis of  By: [name] MD, Kaoru Benda E.    GERD (gastroesophageal reflux disease) 05/23/2017   High cholesterol    Hyperlipidemia 10/12/2012   Hypertension    IBS (irritable bowel syndrome)    chronic Diarrhea   IBS ? 07/16/2008   Qualifier: Diagnosis of  By: [name] MD, [name].    Macular degeneration, dry    Migraine    "long long time ago; maybe 20 yr ago" (09/01/2013)   MIGRAINE HEADACHE 03/13/2010   Qualifier: Diagnosis of  By: [name] MD, Jaystin Mcgarvey E.    Mouth sores 04/15/2017   Neck pain 02/03/2011   Osteoarthritis    "back; goes down my right leg" (09/01/2013)   Subclinical hyperthyroidism 02/26/2018   Thyromegaly 12/04/2014   Type II diabetes mellitus (HCC) 2007    Past Surgical History:  Procedure Laterality Date   CARPAL TUNNEL RELEASE Bilateral ~ 2000   cataract surgery Bilateral    TONSILLECTOMY  07/27/1968   TUBAL LIGATION  07/27/1994    Current Outpatient Medications  Medication Instructions   albuterol (VENTOLIN HFA) 108 (90 Base) MCG/ACT inhaler 2 puffs, Inhalation, Every 6 hours PRN   amLODipine (NORVASC) 10 MG tablet TAKE 1 TABLET BY MOUTH  DAILY   apixaban (ELIQUIS) 5 mg, Oral, 2 times daily   atorvastatin (LIPITOR) 40 mg, Oral, Daily at bedtime   clonazePAM (KLONOPIN) 1 mg, Oral, 2 times daily,  rx by psychiatry  dapagliflozin propanediol (FARXIGA) 10 mg, Oral, Daily   divalproex (DEPAKOTE) 250-500 mg, Oral, See admin instructions, Take one tablet (250 mg) by mouth every morning and two tablets (500 mg) at night   fluticasone furoate-vilanterol (BREO ELLIPTA) 100-25 MCG/ACT AEPB 1 puff, Inhalation, Daily   furosemide (LASIX) 80 mg, Oral, Daily   insulin degludec (TRESIBA) 60 Units, Subcutaneous, Daily at bedtime   ipratropium-albuterol (DUONEB) 0.5-2.5 (3) MG/3ML SOLN 3 mLs, Nebulization, 3 times daily PRN   ketoconazole (NIZORAL) 2 % cream 1 application , Topical, 2 times  daily, For 10 days   Melatonin 20 mg, Oral, Daily at bedtime   metoprolol tartrate (LOPRESSOR) 50 MG tablet TAKE 1 TABLET BY MOUTH TWICE  DAILY   OXYGEN 2 L/min, Inhalation, Continuous   sodium fluoride (PREVIDENT 5000 DRY MOUTH) 1.1 % GEL dental gel 1 Application, dental, 2 times daily   tiZANidine (ZANAFLEX) 2 mg, Oral, 3 times daily   umeclidinium bromide (INCRUSE ELLIPTA) 62.5 MCG/ACT AEPB 1 puff, Inhalation, Daily   Vitamin D (Ergocalciferol) (DRISDOL) 50,000 Units, Oral, Every 7 days       Objective:   Physical Exam BP 138/62   Pulse 72   Temp 98 F (36.7 C) (Axillary)   Resp 18   Ht 5\' 1"  (1.549 m)   LMP 08/24/2015 (Exact Date)   SpO2 (!) 83%   BMI 27.99 kg/m  General:   Well developed, NAD, BMI noted. HEENT:  Normocephalic . Face symmetric, atraumatic Nose: Obvious bleeding noted, worse on the left.  we stop pressure on the nose and within 30 seconds she was bleeding again.  I attempted to see the nasal cavity, I think she has a anterior bleed. MSK:  No TTP at the lumbar spine. Rotation of the hips causes no major problems.  Knees are full range of motion. I attempted to walk her, she reports severe left leg pain when she put pressure on the leg. Lower extremities: no pretibial edema bilaterally.  Good pedal pulses. Skin: Not pale. Not jaundice Neurologic:  alert & oriented X3.  Speech normal Motor: R leg normal.  L: Slightly decreased, unclear if this is a motor deficit or secondary to pain Psych--  Cognition and judgment appear intact.  Cooperative with normal attention span and concentration.  Behavior appropriate. No anxious or depressed appearing.      Assessment     Assessment ENDO: started to see Dr 08/26/2015 12/2017 DM  HTN -- dc lisinopril 06-2015>> lips swell x1 , also had stomatitis >> resolved  High cholesterol GI: ---IBS (diarrhea, unable to control BMs) ---Fatty Liver per CT 04-2020 Thyroid disease -Multinodular goiter, thyromegaly w/  dominant nodule: Negative BX 01-2015 - Hyperthyroidism, subclinical, DX 2019 --s/p thyroid ablation 2019 ? IBS.Chronic diarrhea DJD Psychiatry: Sees Dr. 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 She was discharged from the hospital yesterday with a new prescription for Eliquis. Current episode started 9 AM, worse on the left, I discussed the case with the ER physician who accepted to see her for further treatment.  Appreciate her help. Left leg pain:  Reports severe pain at the whole L leg for 2 to 3 days when she attempts to walk, no recent injury, denies back pain, reports some tingling on the leg.  No bladder or  bowel incontinence. Recent ultrasound showed no DVT @ LE and she has good pedal pulses. Etiology unclear, this was discussed with the ER physician, x-rays? Paroxysmal A-fib: We will communicate with cardiology regards severe nosebleed. (Addendum : ok to hold eliquis x 24 hours if needed)  Time spent: 35 minutes, reviewing data from the recent admission, coordinating her care attending two acute problems

## 2022-01-30 NOTE — ED Provider Notes (Signed)
[facility] Provider Note   CSN: [id number] Arrival date & time: 01/30/22  1322     History  Chief Complaint  Patient presents with   Leg Pain    Left   Epistaxis    Adrienne Bennett is a 61 y.o. female.  Patient is a 61 year old female presenting for leg pain.  Patient admits to left leg pain, is unable to specify where the pain is coming from, described as severe, x3 days.  Patient states Adrienne Bennett was recently hospitalized for new onset A-fib.  States Adrienne Bennett started having leg pain in the hospital and was evaluated for a DVT with a bilateral lower extremity ultrasound that was negative.  Patient states Adrienne Bennett was discharged yesterday and went home and fell while trying to go to the bathroom.  Denies any blunt head trauma but admits to worsening of left leg pain.  Pain is worse with standing.  Patient also having epistaxis from bilateral naris on physical exam.  Does have a history of chronic nosebleeds prior to starting Eliquis this week.  The history is provided by the patient. No language interpreter was used.  Leg Pain Associated symptoms: no back pain and no fever   Epistaxis Associated symptoms: no cough, no fever and no sore throat        Home Medications Prior to Admission medications   Medication Sig Start Date End Date Taking? Authorizing Provider  albuterol (VENTOLIN HFA) 108 (90 Base) MCG/ACT inhaler Inhale 2 puffs into the lungs every 6 (six) hours as needed for wheezing or shortness of breath. 02/26/21   Colon Branch, MD  amLODipine (NORVASC) 10 MG tablet TAKE 1 TABLET BY MOUTH  DAILY Patient taking differently: Take 10 mg by mouth every morning. 09/24/21   Colon Branch, MD  apixaban (ELIQUIS) 5 MG TABS tablet Take 1 tablet (5 mg total) by mouth 2 (two) times daily. 01/29/22   Caren Griffins, MD  atorvastatin (LIPITOR) 40 MG tablet Take 1 tablet (40 mg total) by mouth at bedtime. 10/27/21   Colon Branch, MD  clonazePAM (KLONOPIN) 1 MG tablet Take 1 mg  by mouth 2 (two) times daily.  rx by psychiatry    [provider]  dapagliflozin propanediol (FARXIGA) 10 MG TABS tablet Take 1 tablet (10 mg total) by mouth daily. 01/30/22   Caren Griffins, MD  divalproex (DEPAKOTE) 250 MG DR tablet Take 250-500 mg by mouth See admin instructions. Take one tablet (250 mg) by mouth every morning and two tablets (500 mg) at night    [provider]  fluticasone furoate-vilanterol (BREO ELLIPTA) 100-25 MCG/ACT AEPB Inhale 1 puff into the lungs daily. Patient taking differently: Inhale 1 puff into the lungs every morning. 01/10/22 02/09/22  Antonieta Pert, MD  furosemide (LASIX) 80 MG tablet Take 1 tablet (80 mg total) by mouth daily. 01/30/22   Caren Griffins, MD  insulin degludec (TRESIBA) 200 UNIT/ML FlexTouch Pen Inject 60 Units into the skin at bedtime. 05/02/20   Colon Branch, MD  ipratropium-albuterol (DUONEB) 0.5-2.5 (3) MG/3ML SOLN Take 3 mLs by nebulization 3 (three) times daily as needed. Patient taking differently: Take 3 mLs by nebulization 3 (three) times daily as needed (shortness of breath/wheezing). 01/10/22 02/09/22  Antonieta Pert, MD  ketoconazole (NIZORAL) 2 % cream Apply 1 application topically 2 (two) times daily. For 10 days 03/27/21   Colon Branch, MD  Melatonin 10 MG TABS Take 20 mg by mouth at bedtime.  [provider]  metoprolol tartrate (LOPRESSOR) 50 MG tablet TAKE 1 TABLET BY MOUTH TWICE  DAILY Patient taking differently: Take 50 mg by mouth 2 (two) times daily. 11/18/21   [name], MD  OXYGEN Inhale 2 L/min into the lungs continuous.    [provider]  sodium fluoride (PREVIDENT 5000 DRY MOUTH) 1.1 % GEL dental gel Place 1 Application onto teeth 2 (two) times daily.    [provider]  tiZANidine (ZANAFLEX) 2 MG tablet Take 1 tablet (2 mg total) by mouth 3 (three) times daily. Patient taking differently: Take 2 mg by mouth 3 (three) times daily as needed (headaches). 12/26/21   [name], MD   umeclidinium bromide (INCRUSE ELLIPTA) 62.5 MCG/ACT AEPB Inhale 1 puff into the lungs daily. Patient taking differently: Inhale 1 puff into the lungs every morning. 01/10/22 02/09/22  Lanae Boast, MD  Vitamin D, Ergocalciferol, (DRISDOL) 1.25 MG (50000 UNIT) CAPS capsule Take 1 capsule (50,000 Units total) by mouth every 7 (seven) days. Patient taking differently: Take 50,000 Units by mouth every Wednesday. At night 10/30/21   [name], MD      Allergies    Benztropine, Lactose intolerance (gi), Lisinopril, Other, Penicillins, Xanax [alprazolam], and Monistat 3 combo pack app  [miconazole nitrate]    Review of Systems   Review of Systems  Constitutional:  Negative for chills and fever.  HENT:  Positive for nosebleeds. Negative for ear pain and sore throat.   Eyes:  Negative for pain and visual disturbance.  Respiratory:  Negative for cough and shortness of breath.   Cardiovascular:  Negative for chest pain and palpitations.  Gastrointestinal:  Negative for abdominal pain and vomiting.  Genitourinary:  Negative for dysuria and hematuria.  Musculoskeletal:  Positive for gait problem. Negative for arthralgias and back pain.  Skin:  Negative for color change and rash.  Neurological:  Negative for seizures and syncope.  All other systems reviewed and are negative.   Physical Exam Updated Vital Signs BP 130/72   Pulse 70   Temp 98.9 F (37.2 C) (Oral)   Resp 16   LMP 08/24/2015 (Exact Date)   SpO2 99%  Physical Exam Vitals and nursing note reviewed.  Constitutional:      General: Adrienne Bennett is not in acute distress.    Appearance: Adrienne Bennett is well-developed.  HENT:     Head: Normocephalic and atraumatic.     Nose:     Right Nostril: Epistaxis present.     Left Nostril: Epistaxis present.  Eyes:     Conjunctiva/sclera: Conjunctivae normal.  Cardiovascular:     Rate and Rhythm: Normal rate and regular rhythm.     Pulses:          Dorsalis pedis pulses are 2+ on the left side.     Heart  sounds: No murmur heard. Pulmonary:     Effort: Pulmonary effort is normal. No respiratory distress.     Breath sounds: Normal breath sounds.  Abdominal:     Palpations: Abdomen is soft.     Tenderness: There is no abdominal tenderness.  Musculoskeletal:        General: No swelling.     Cervical back: Neck supple.     Left hip: Normal.     Left upper leg: Normal.     Left knee: Normal.     Left lower leg: Normal.     Left ankle: Tenderness present over the lateral malleolus.     Left Achilles Tendon:  Normal.     Left foot: Normal.  Skin:    General: Skin is warm and dry.     Capillary Refill: Capillary refill takes less than 2 seconds.  Neurological:     General: No focal deficit present.     Mental Status: Adrienne Bennett is alert and oriented to person, place, and time.     GCS: GCS eye subscore is 4. GCS verbal subscore is 5. GCS motor subscore is 6.     Sensory: Sensation is intact.     Motor: Motor function is intact.  Psychiatric:        Mood and Affect: Mood normal.     ED Results / Procedures / Treatments   Labs (all labs ordered are listed, but only abnormal results are displayed) Labs Reviewed  COMPREHENSIVE METABOLIC PANEL - Abnormal; Notable for the following components:      Result Value   Sodium 130 (*)    Chloride 85 (*)    CO2 37 (*)    Glucose, Bld 392 (*)    BUN 46 (*)    Creatinine, Ser 1.52 (*)    Total Protein 8.9 (*)    GFR, Estimated 39 (*)    All other components within normal limits  CBC    EKG None  Radiology VAS [place] LOWER EXTREMITY VENOUS (DVT)  Result Date: 01/28/2022  Lower Venous DVT Study Patient Name:  ELEE LANIUS  Date of Exam:   01/28/2022 Medical Rec #: [id number]         Accession #:    [id number] Date of Birth: 07-Jun-1961         Patient Gender: F Patient Age:   77 years Exam Location:  [facility] Procedure:      VAS [place] LOWER EXTREMITY VENOUS (DVT) Referring Phys: Marzetta Board  --------------------------------------------------------------------------------  Indications: LLE pain & swelling.  Comparison Study: No previous exams Performing Technologist: Jody Hill RVT, RDMS  Examination Guidelines: A complete evaluation includes B-mode imaging, spectral Doppler, color Doppler, and power Doppler as needed of all accessible portions of each vessel. Bilateral testing is considered an integral part of a complete examination. Limited examinations for reoccurring indications may be performed as noted. The reflux portion of the exam is performed with the patient in reverse Trendelenburg.  +---------+---------------+---------+-----------+----------+--------------+ RIGHT    CompressibilityPhasicitySpontaneityPropertiesThrombus Aging +---------+---------------+---------+-----------+----------+--------------+ CFV      Full           Yes      Yes                                 +---------+---------------+---------+-----------+----------+--------------+ SFJ      Full                                                        +---------+---------------+---------+-----------+----------+--------------+ FV Prox  Full           Yes      Yes                                 +---------+---------------+---------+-----------+----------+--------------+ FV Mid   Full           Yes      Yes                                 +---------+---------------+---------+-----------+----------+--------------+  FV DistalFull           Yes      Yes                                 +---------+---------------+---------+-----------+----------+--------------+ PFV      Full                                                        +---------+---------------+---------+-----------+----------+--------------+ POP      Full           Yes      Yes                                 +---------+---------------+---------+-----------+----------+--------------+ PTV      Full                                                         +---------+---------------+---------+-----------+----------+--------------+ PERO     Full                                                        +---------+---------------+---------+-----------+----------+--------------+   +---------+---------------+---------+-----------+----------+-------------------+ LEFT     CompressibilityPhasicitySpontaneityPropertiesThrombus Aging      +---------+---------------+---------+-----------+----------+-------------------+ CFV      Full           Yes      Yes                                      +---------+---------------+---------+-----------+----------+-------------------+ SFJ      Full                                                             +---------+---------------+---------+-----------+----------+-------------------+ FV Prox  Full           Yes      Yes                                      +---------+---------------+---------+-----------+----------+-------------------+ FV Mid   Full           Yes      Yes                                      +---------+---------------+---------+-----------+----------+-------------------+ FV DistalFull           Yes      Yes                                      +---------+---------------+---------+-----------+----------+-------------------+  PFV      Full                                                             +---------+---------------+---------+-----------+----------+-------------------+ POP      Full           Yes      Yes                                      +---------+---------------+---------+-----------+----------+-------------------+ PTV      Full                                                             +---------+---------------+---------+-----------+----------+-------------------+ PERO     Full                                         Not well visualized  +---------+---------------+---------+-----------+----------+-------------------+     Summary: BILATERAL: - No evidence of deep vein thrombosis seen in the lower extremities, bilaterally. -No evidence of popliteal cyst, bilaterally.   *See table(s) above for measurements and observations. Electronically signed by Harold Barban MD on 01/28/2022 at 5:58:49 PM.    Final     Procedures Procedures    Medications Ordered in ED Medications  oxymetazoline (AFRIN) 0.05 % nasal spray 1 spray (has no administration in time range)  oxyCODONE (Oxy IR/ROXICODONE) immediate release tablet 5 mg (5 mg Oral Given 01/30/22 1512)  ondansetron (ZOFRAN-ODT) disintegrating tablet 4 mg (4 mg Oral Given 01/30/22 1513)    ED Course/ Medical Decision Making/ A&P                           Medical Decision Making Amount and/or Complexity of Data Reviewed Labs: ordered. Radiology: ordered.  Risk Prescription drug management.   61 year old female presenting for leg pain.  Patient has generalized left leg pain when standing.  On physical exam Adrienne Bennett has tenderness palpation of the left lateral malleolus with a history of fall yesterday.  X-ray ordered and pending.  2 dorsalis pedis.  No skin discoloration.  Normal cap refill low suspicion arterial occlusion.  Patient had DVT ultrasound 2 days ago on 01/28/2022 demonstrating no DVT.  No wounds or soft tissue disease suggesting cellulitis.   Signed out to oncoming physician while awaiting imaging studies.        Final Clinical Impression(s) / ED Diagnoses Final diagnoses:  Pain of left lower extremity  Epistaxis    Rx / DC Orders ED Discharge Orders     None         Lianne Cure, DO [id number]

## 2022-01-30 NOTE — ED Notes (Signed)
ED Provider at bedside. 

## 2022-01-30 NOTE — ED Triage Notes (Signed)
Pt c/o left leg pain x 2 days. Went to PCP this morning for leg pain, then was sent here for nose bleed. Bleeding controlled at this time. + blood thinners. Recently dx with a fib last week and placed on Eliquis. Denies shob, cp.

## 2022-01-30 NOTE — Telephone Encounter (Signed)
**Note De-Identified  Obfuscation** Patient contacted regarding discharge from [facility] on 01/29/2022.  Patient understands to follow up with provider Eligha Bridegroom, NP on 02/06/2022 at 10:05 at [address], [address] in [place], [place] [address].  Patient understands discharge instructions? Yes Patient understands medications and regiment? Yes Patient understands to bring all medications to this visit? Yes  Ask patient:  Are you enrolled in My Chart: Yes  The pt states that other than gout in her leg and nose bleeds from her nose canula she is doing ok. She does have an appointment with her PCP today. She denies CP/discomfort, worsening SOB, dizziness, lightheadedness, nausea, diaphoresis, weight gain or worsening edema.  She does have [facility]'s phone number to call us if she has any questions or concerns. She thanked me for calling her.

## 2022-01-30 NOTE — Discharge Instructions (Signed)
Ultrasound 2 days ago demonstrated no DVT. No evidence of arterial occlusion. No evidence of infection or cellulitis. X-ray demonstrates no fractures.  Recommendations for continued follow-up with PCP for pain.

## 2022-02-01 NOTE — Assessment & Plan Note (Signed)
Severe nosebleed: Patient reports several weeks history of nosebleeds on and off (unknown to me). She was discharged from the hospital yesterday with a new prescription for Eliquis. Current episode started 9 AM, worse on the left, I discussed the case with the ER physician who accepted to see her for further treatment.  Appreciate her help. Left leg pain:  Reports severe pain at the whole L leg for 2 to 3 days when she attempts to walk, no recent injury, denies back pain, reports some tingling on the leg.  No bladder or bowel incontinence. Recent ultrasound showed no DVT @ LE and she has good pedal pulses. Etiology unclear, this was discussed with the ER physician, x-rays? Paroxysmal A-fib: We will communicate with cardiology regards severe nosebleed. (Addendum : ok to hold eliquis x 24 hours if needed)

## 2022-02-01 NOTE — Progress Notes (Signed)
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 sea salt. Worcestershire sauce. Tartar sauce. Barbecue sauce. Teriyaki sauce. Soy sauce, including reduced-sodium. Steak sauce. Canned and packaged gravies. Fish sauce. Oyster sauce. Cocktail sauce. Store-bought horseradish. Ketchup. Mustard. Meat flavorings and tenderizers. Bouillon cubes. Hot sauces. Pre-made or packaged marinades. Pre-made or packaged taco seasonings. Relishes. Regular salad dressings. Other foods Salted popcorn and pretzels. The items listed above may not be a complete list of foods and beverages you should avoid. Contact a dietitian for more information. Where to find more information National Heart, Lung, and Blood Institute: PopSteam.is American Heart Association: www.heart.org Academy of Nutrition and Dietetics: www.eatright.org National Kidney Foundation: www.kidney.org Summary The DASH eating plan is a healthy eating plan that has been shown to reduce high blood pressure (hypertension). It may also reduce your risk for type 2 diabetes, heart disease, and stroke. When on the DASH  eating plan, aim to eat more fresh fruits and vegetables, whole grains, lean proteins, low-fat dairy, and heart-healthy fats. With the DASH eating plan, you should limit salt (sodium) intake to 2,300 mg a day. If you have hypertension, you may need to reduce your sodium intake to 1,500 mg a day. Work with your health care provider or dietitian to adjust your eating plan to your individual calorie needs. This information is not intended to replace advice given to you by your health care provider. Make sure you discuss any questions you have with your health care provider. Document Revised: 06/16/2019 Document Reviewed: 06/16/2019 Elsevier Patient Education  2023 [employer]  Mediterranean Diet A Mediterranean diet refers to food and lifestyle choices that are based on the traditions of countries located on the [place]. It focuses on eating more fruits, vegetables, whole grains, beans, nuts, seeds, and heart-healthy fats, and eating less dairy, meat, eggs, and processed foods with added sugar, salt, and fat. This way of eating has been shown to help prevent certain conditions and improve outcomes for people who have chronic diseases, like kidney disease and heart disease. What are tips for following this plan? Reading food labels Check the serving size of packaged foods. For foods such as rice and pasta, the serving size refers to the amount of cooked product, not dry. Check the total fat in packaged foods. Avoid foods that have saturated fat or trans fats. Check the ingredient list for added sugars, such as corn syrup. Shopping  Buy a variety of foods that offer a balanced diet, including: Fresh fruits and vegetables (produce). Grains, beans, nuts, and seeds. Some of these may be available in unpackaged forms or large amounts (in bulk). Fresh seafood. Poultry and eggs. Low-fat dairy products. Buy whole ingredients instead of prepackaged foods. Buy fresh fruits and vegetables  in-season from local farmers markets. Buy plain frozen fruits and vegetables. If you do not have access to quality fresh seafood, buy precooked frozen shrimp or canned fish, such as tuna, salmon, or sardines. Stock your pantry so you always have certain foods on hand, such as olive oil, canned tuna, canned tomatoes, rice, pasta, and beans. Cooking Cook foods with extra-virgin olive oil instead of using butter or other vegetable oils. Have meat as a side dish, and have vegetables or grains as your main dish. This means having meat in small portions or adding small amounts of meat to foods like pasta or stew. Use beans  or vegetables instead of meat in common dishes like chili or lasagna. Experiment with different cooking methods. Try roasting, broiling, steaming, and sauting vegetables. Add frozen vegetables to soups, stews, pasta, or rice. Add nuts or seeds for added healthy fats and plant protein at each meal. You can add these to yogurt, salads, or vegetable dishes. Marinate fish or vegetables using olive oil, lemon juice, garlic, and fresh herbs. Meal planning Plan to eat one vegetarian meal one day each week. Try to work up to two vegetarian meals, if possible. Eat seafood two or more times a week. Have healthy snacks readily available, such as: Vegetable sticks with hummus. Greek yogurt. Fruit and nut trail mix. Eat balanced meals throughout the week. This includes: Fruit: 2-3 servings a day. Vegetables: 4-5 servings a day. Low-fat dairy: 2 servings a day. Fish, poultry, or lean meat: 1 serving a day. Beans and legumes: 2 or more servings a week. Nuts and seeds: 1-2 servings a day. Whole grains: 6-8 servings a day. Extra-virgin olive oil: 3-4 servings a day. Limit red meat and sweets to only a few servings a month. Lifestyle  Cook and eat meals together with your family, when possible. Drink enough fluid to keep your urine pale yellow. Be physically active every day. This  includes: Aerobic exercise like running or swimming. Leisure activities like gardening, walking, or housework. Get 7-8 hours of sleep each night. If recommended by your health care provider, drink red wine in moderation. This means 1 glass a day for nonpregnant women and 2 glasses a day for men. A glass of wine equals 5 oz (150 mL). What foods should I eat? Fruits Apples. Apricots. Avocado. Berries. Bananas. Cherries. Dates. Figs. Grapes. Lemons. Melon. Oranges. Peaches. Plums. Pomegranate. Vegetables Artichokes. Beets. Broccoli. Cabbage. Carrots. Eggplant. Green beans. Chard. Kale. Spinach. Onions. Leeks. Peas. Squash. Tomatoes. Peppers. Radishes. Grains Whole-grain pasta. Brown rice. Bulgur wheat. Polenta. Couscous. Whole-wheat bread. [place]. Meats and other proteins Beans. Almonds. Sunflower seeds. Pine nuts. Peanuts. [place]. Salmon. Scallops. Shrimp. [place]. Tilapia. Clams. Oysters. Eggs. Poultry without skin. Dairy Low-fat milk. Cheese. Greek yogurt. Fats and oils Extra-virgin olive oil. Avocado oil. Grapeseed oil. Beverages Water. Red wine. Herbal tea. Sweets and desserts Greek yogurt with honey. Baked apples. Poached pears. Trail mix. Seasonings and condiments Basil. Cilantro. Coriander. Cumin. Mint. Parsley. Sage. Rosemary. Tarragon. Garlic. Oregano. Thyme. Pepper. Balsamic vinegar. Tahini. Hummus. Tomato sauce. Olives. Mushrooms. The items listed above may not be a complete list of foods and beverages you can eat. Contact a dietitian for more information. What foods should I limit? This is a list of foods that should be eaten rarely or only on special occasions. Fruits Fruit canned in syrup. Vegetables Deep-fried potatoes (french fries). Grains Prepackaged pasta or rice dishes. Prepackaged cereal with added sugar. Prepackaged snacks with added sugar. Meats and other proteins Beef. Pork. Lamb. Poultry with skin. Hot dogs. Berniece Salines. Dairy Ice cream. Sour cream. Whole  milk. Fats and oils Butter. Canola oil. Vegetable oil. Beef fat (tallow). Lard. Beverages Juice. Sugar-sweetened soft drinks. Beer. Liquor and spirits. Sweets and desserts Cookies. Cakes. Pies. Candy. Seasonings and condiments Mayonnaise. Pre-made sauces and marinades. The items listed above may not be a complete list of foods and beverages you should limit. Contact a dietitian for more information. Summary The Mediterranean diet includes both food and lifestyle choices. Eat a variety of fresh fruits and vegetables, beans, nuts, seeds, and whole grains. Limit the amount of red meat and sweets that you eat. If recommended  by your health care provider, drink red wine in moderation. This means 1 glass a day for nonpregnant women and 2 glasses a day for men. A glass of wine equals 5 oz (150 mL). This information is not intended to replace advice given to you by your health care provider. Make sure you discuss any questions you have with your health care provider. Document Revised: 08/18/2019 Document Reviewed: 06/15/2019 Elsevier Patient Education  [place]         Signed, Emmaline Life, NP  02/06/2022 12:16 PM    [facility]

## 2022-02-05 ENCOUNTER — Telehealth: Payer: Self-pay | Admitting: Internal Medicine

## 2022-02-05 DIAGNOSIS — R399 Unspecified symptoms and signs involving the genitourinary system: Secondary | ICD-10-CM

## 2022-02-05 NOTE — Telephone Encounter (Signed)
Pt stated she has a uti and needs medication. She stated her gynecologist advised pcp would be more helpful as he knows all of her current medications. Advised pt she would need an appt so we can prescribe the right type of medication. She stated she has an appt Monday with pcp but cannot wait until and needs something now. She stated her gyno dr usually prescribes diflucan and she would like that called in. Please advise pt of next steps.

## 2022-02-06 ENCOUNTER — Other Ambulatory Visit: Payer: Medicare Other

## 2022-02-06 ENCOUNTER — Ambulatory Visit: Payer: Medicare Other | Admitting: Nurse Practitioner

## 2022-02-06 ENCOUNTER — Encounter: Payer: Self-pay | Admitting: Nurse Practitioner

## 2022-02-06 VITALS — BP 108/50 | HR 72 | Ht 60.0 in | Wt 146.0 lb

## 2022-02-06 DIAGNOSIS — E785 Hyperlipidemia, unspecified: Secondary | ICD-10-CM | POA: Diagnosis not present

## 2022-02-06 DIAGNOSIS — I5032 Chronic diastolic (congestive) heart failure: Secondary | ICD-10-CM

## 2022-02-06 DIAGNOSIS — E1122 Type 2 diabetes mellitus with diabetic chronic kidney disease: Secondary | ICD-10-CM

## 2022-02-06 DIAGNOSIS — I10 Essential (primary) hypertension: Secondary | ICD-10-CM | POA: Diagnosis not present

## 2022-02-06 DIAGNOSIS — N1831 Chronic kidney disease, stage 3a: Secondary | ICD-10-CM

## 2022-02-06 DIAGNOSIS — I358 Other nonrheumatic aortic valve disorders: Secondary | ICD-10-CM | POA: Diagnosis not present

## 2022-02-06 DIAGNOSIS — I480 Paroxysmal atrial fibrillation: Secondary | ICD-10-CM

## 2022-02-06 DIAGNOSIS — Z7901 Long term (current) use of anticoagulants: Secondary | ICD-10-CM | POA: Diagnosis not present

## 2022-02-06 LAB — BASIC METABOLIC PANEL
BUN/Creatinine Ratio: 21 (ref 12–28)
BUN: 27 mg/dL (ref 8–27)
CO2: 33 mmol/L — ABNORMAL HIGH (ref 20–29)
Calcium: 8.7 mg/dL (ref 8.7–10.3)
Chloride: 87 mmol/L — ABNORMAL LOW (ref 96–106)
Creatinine, Ser: 1.29 mg/dL — ABNORMAL HIGH (ref 0.57–1.00)
Glucose: 335 mg/dL — ABNORMAL HIGH (ref 70–99)
Potassium: 4.5 mmol/L (ref 3.5–5.2)
Sodium: 132 mmol/L — ABNORMAL LOW (ref 134–144)
eGFR: 47 mL/min/{1.73_m2} — ABNORMAL LOW (ref >59)

## 2022-02-06 NOTE — Telephone Encounter (Signed)
Tried calling Pt- phone went straight to voicemail- left detailed message asking what sx's she is having, asked that she have her husband come by office to pick up urine container, orders placed.

## 2022-02-06 NOTE — Patient Instructions (Addendum)
Medication Instructions:   Your physician recommends that you continue on your current medications as directed. Please refer to the Current Medication list given to you today.   *If you need a refill on your cardiac medications before your next appointment, please call your pharmacy*   Lab Work:  TODAY!!!! BMET  If you have labs (blood work) drawn today and your tests are completely normal, you will receive your results only by: MyChart Message (if you have MyChart) OR A paper copy in the mail If you have any lab test that is abnormal or we need to change your treatment, we will call you to review the results.   Testing/Procedures:  None ordered.   Follow-Up: At [facility], you and your health needs are our priority.  As part of our continuing mission to provide you with exceptional heart care, we have created designated Provider Care Teams.  These Care Teams include your primary Cardiologist (physician) and Advanced Practice Providers (APPs -  Physician Assistants and Nurse Practitioners) who all work together to provide you with the care you need, when you need it.  We recommend signing up for the patient portal called "MyChart".  Sign up information is provided on this After Visit Summary.  MyChart is used to connect with patients for Virtual Visits (Telemedicine).  Patients are able to view lab/test results, encounter notes, upcoming appointments, etc.  Non-urgent messages can be sent to your provider as well.   To learn more about what you can do with MyChart, go to [email or web address].    Your next appointment:   2 month(s)  The format for your next appointment:   In Person  Provider:   [name], MD    Other Instructions  Heart Failure Education: Weigh yourself EVERY morning after you go to the bathroom but before you eat or drink anything. Write this number down in a weight log/diary. If you gain 3 pounds overnight or 5 pounds in a week, call the  office. Take your medicines as prescribed. If you have concerns about your medications, please call us before you stop taking them.  Eat low salt foods--Limit salt (sodium) to 2000 mg per day. This will help prevent your body from holding onto fluid. Read food labels as many processed foods have a lot of sodium, especially canned goods and prepackaged meats. If you would like some assistance choosing low sodium foods, we would be happy to set you up with a nutritionist. Stay as active as you can everyday. Staying active will give you more energy and make your muscles stronger. Start with 5 minutes at a time and work your way up to 30 minutes a day. Break up your activities--do some in the morning and some in the afternoon. Start with 3 days per week and work your way up to 5 days as you can.  If you have chest pain, feel short of breath, dizzy, or lightheaded, STOP. If you don't feel better after a short rest, call 911. If you do feel better, call the office to let us know you have symptoms with exercise.  Limit all fluids for the day to less than 2 liters. Fluid includes all drinks, coffee, juice, ice chips, soup, jello, and all other liquids. Recommend weighing daily and keeping a log. Please call our office if you have weight gain of 2 pounds overnight or 5 pounds in 1 week.   Date  Time Weight  DASH Eating Plan DASH stands for Dietary Approaches to Stop Hypertension. The DASH eating plan is a healthy eating plan that has been shown to: Reduce high blood pressure (hypertension). Reduce your risk for type 2 diabetes, heart disease, and stroke. Help with weight loss. What are tips for following this plan? Reading food labels Check food labels for the amount of salt (sodium) per serving. Choose foods with less than 5 percent of the Daily Value of sodium. Generally, foods with less than 300 milligrams (mg) of sodium per serving fit into this  eating plan. To find whole grains, look for the word "whole" as the first word in the ingredient list. Shopping Buy products labeled as "low-sodium" or "no salt added." Buy fresh foods. Avoid canned foods and pre-made or frozen meals. Cooking Avoid adding salt when cooking. Use salt-free seasonings or herbs instead of table salt or sea salt. Check with your health care provider or pharmacist before using salt substitutes. Do not fry foods. Cook foods using healthy methods such as baking, boiling, grilling, roasting, and broiling instead. Cook with heart-healthy oils, such as olive, canola, avocado, soybean, or sunflower oil. Meal planning  Eat a balanced diet that includes: 4 or more servings of fruits and 4 or more servings of vegetables each day. Try to fill one-half of your plate with fruits and vegetables. 6-8 servings of whole grains each day. Less than 6 oz (170 g) of lean meat, poultry, or fish each day. A 3-oz (85-g) serving of meat is about the same size as a deck of cards. One egg equals 1 oz (28 g). 2-3 servings of low-fat dairy each day. One serving is 1 cup (237 mL). 1 serving of nuts, seeds, or beans 5 times each week. 2-3 servings of heart-healthy fats. Healthy fats called omega-3 fatty acids are found in foods such as walnuts, flaxseeds, fortified milks, and eggs. These fats are also found in cold-water fish, such as sardines, salmon, and mackerel. Limit how much you eat of: Canned or prepackaged foods. Food that is high in trans fat, such as some fried foods. Food that is high in saturated fat, such as fatty meat. Desserts and other sweets, sugary drinks, and other foods with added sugar. Full-fat dairy products. Do not salt foods before eating. Do not eat more than 4 egg yolks a week. Try to eat at least 2 vegetarian meals a week. Eat more home-cooked food and less restaurant, buffet, and fast food. Lifestyle When eating at a restaurant, ask that your food be prepared  with less salt or no salt, if possible. If you drink alcohol: Limit how much you use to: 0-1 drink a day for women who are not pregnant. 0-2 drinks a day for men. Be aware of how much alcohol is in your drink. In the [place] one drink equals one 12 oz bottle of beer (355 mL), one 5 oz glass of wine (148 mL), or one 1 oz glass of hard liquor (44 mL). General information Avoid eating more than 2,300 mg of salt a day. If you have hypertension, you may need to reduce your sodium intake to 1,500 mg a day. Work with your health care provider to maintain a healthy body weight or to lose weight. Ask what an ideal weight is for you. Get at least 30 minutes of exercise that causes your heart to beat faster (aerobic exercise) most days of the week. Activities may include walking, swimming, or biking. Work with your health care provider or dietitian  to adjust your eating plan to your individual calorie needs. What foods should I eat? Fruits All fresh, dried, or frozen fruit. Canned fruit in natural juice (without added sugar). Vegetables Fresh or frozen vegetables (raw, steamed, roasted, or grilled). Low-sodium or reduced-sodium tomato and vegetable juice. Low-sodium or reduced-sodium tomato sauce and tomato paste. Low-sodium or reduced-sodium canned vegetables. Grains Whole-grain or whole-wheat bread. Whole-grain or whole-wheat pasta. Brown rice. [place]. Bulgur. Whole-grain and low-sodium cereals. Pita bread. Low-fat, low-sodium crackers. Whole-wheat flour tortillas. Meats and other proteins Skinless chicken or [place]. Ground chicken or [place]. Pork with fat trimmed off. Fish and seafood. Egg whites. Dried beans, peas, or lentils. Unsalted nuts, nut butters, and seeds. Unsalted canned beans. Lean cuts of beef with fat trimmed off. Low-sodium, lean precooked or cured meat, such as sausages or meat loaves. Dairy Low-fat (1%) or fat-free (skim) milk. Reduced-fat, low-fat, or fat-free cheeses. Nonfat,  low-sodium ricotta or cottage cheese. Low-fat or nonfat yogurt. Low-fat, low-sodium cheese. Fats and oils Soft margarine without trans fats. Vegetable oil. Reduced-fat, low-fat, or light mayonnaise and salad dressings (reduced-sodium). Canola, safflower, olive, avocado, soybean, and sunflower oils. Avocado. Seasonings and condiments Herbs. Spices. Seasoning mixes without salt. Other foods Unsalted popcorn and pretzels. Fat-free sweets. The items listed above may not be a complete list of foods and beverages you can eat. Contact a dietitian for more information. What foods should I avoid? Fruits Canned fruit in a light or heavy syrup. Fried fruit. Fruit in cream or butter sauce. Vegetables Creamed or fried vegetables. Vegetables in a cheese sauce. Regular canned vegetables (not low-sodium or reduced-sodium). Regular canned tomato sauce and paste (not low-sodium or reduced-sodium). Regular tomato and vegetable juice (not low-sodium or reduced-sodium). Angie Fava. Olives. Grains Baked goods made with fat, such as croissants, muffins, or some breads. Dry pasta or rice meal packs. Meats and other proteins Fatty cuts of meat. Ribs. Fried meat. Berniece Salines. Bologna, salami, and other precooked or cured meats, such as sausages or meat loaves. Fat from the back of a pig (fatback). Bratwurst. Salted nuts and seeds. Canned beans with added salt. Canned or smoked fish. Whole eggs or egg yolks. Chicken or [place] with skin. Dairy Whole or 2% milk, cream, and half-and-half. Whole or full-fat cream cheese. Whole-fat or sweetened yogurt. Full-fat cheese. Nondairy creamers. Whipped toppings. Processed cheese and cheese spreads. Fats and oils Butter. Stick margarine. Lard. Shortening. Ghee. Bacon fat. Tropical oils, such as coconut, palm kernel, or palm oil. Seasonings and condiments Onion salt, garlic salt, seasoned salt, table salt, and sea salt. Worcestershire sauce. Tartar sauce. Barbecue sauce. Teriyaki sauce. Soy  sauce, including reduced-sodium. Steak sauce. Canned and packaged gravies. Fish sauce. Oyster sauce. Cocktail sauce. Store-bought horseradish. Ketchup. Mustard. Meat flavorings and tenderizers. Bouillon cubes. Hot sauces. Pre-made or packaged marinades. Pre-made or packaged taco seasonings. Relishes. Regular salad dressings. Other foods Salted popcorn and pretzels. The items listed above may not be a complete list of foods and beverages you should avoid. Contact a dietitian for more information. Where to find more information National Heart, Lung, and Blood Institute: https://wilson-eaton.com/ American Heart Association: www.heart.org Academy of Nutrition and Dietetics: www.eatright.[place]: www.kidney.org Summary The DASH eating plan is a healthy eating plan that has been shown to reduce high blood pressure (hypertension). It may also reduce your risk for type 2 diabetes, heart disease, and stroke. When on the DASH eating plan, aim to eat more fresh fruits and vegetables, whole grains, lean proteins, low-fat dairy, and heart-healthy fats. With the  DASH eating plan, you should limit salt (sodium) intake to 2,300 mg a day. If you have hypertension, you may need to reduce your sodium intake to 1,500 mg a day. Work with your health care provider or dietitian to adjust your eating plan to your individual calorie needs. This information is not intended to replace advice given to you by your health care provider. Make sure you discuss any questions you have with your health care provider. Document Revised: 06/16/2019 Document Reviewed: 06/16/2019 Elsevier Patient Education  2023 Elsevier Inc.  Mediterranean Diet A Mediterranean diet refers to food and lifestyle choices that are based on the traditions of countries located on the [employer]. It focuses on eating more fruits, vegetables, whole grains, beans, nuts, seeds, and heart-healthy fats, and eating less dairy, meat, eggs, and  processed foods with added sugar, salt, and fat. This way of eating has been shown to help prevent certain conditions and improve outcomes for people who have chronic diseases, like kidney disease and heart disease. What are tips for following this plan? Reading food labels Check the serving size of packaged foods. For foods such as rice and pasta, the serving size refers to the amount of cooked product, not dry. Check the total fat in packaged foods. Avoid foods that have saturated fat or trans fats. Check the ingredient list for added sugars, such as corn syrup. Shopping  Buy a variety of foods that offer a balanced diet, including: Fresh fruits and vegetables (produce). Grains, beans, nuts, and seeds. Some of these may be available in unpackaged forms or large amounts (in bulk). Fresh seafood. Poultry and eggs. Low-fat dairy products. Buy whole ingredients instead of prepackaged foods. Buy fresh fruits and vegetables in-season from local farmers markets. Buy plain frozen fruits and vegetables. If you do not have access to quality fresh seafood, buy precooked frozen shrimp or canned fish, such as tuna, salmon, or sardines. Stock your pantry so you always have certain foods on hand, such as olive oil, canned tuna, canned tomatoes, rice, pasta, and beans. Cooking Cook foods with extra-virgin olive oil instead of using butter or other vegetable oils. Have meat as a side dish, and have vegetables or grains as your main dish. This means having meat in small portions or adding small amounts of meat to foods like pasta or stew. Use beans or vegetables instead of meat in common dishes like chili or lasagna. Experiment with different cooking methods. Try roasting, broiling, steaming, and sauting vegetables. Add frozen vegetables to soups, stews, pasta, or rice. Add nuts or seeds for added healthy fats and plant protein at each meal. You can add these to yogurt, salads, or vegetable  dishes. Marinate fish or vegetables using olive oil, lemon juice, garlic, and fresh herbs. Meal planning Plan to eat one vegetarian meal one day each week. Try to work up to two vegetarian meals, if possible. Eat seafood two or more times a week. Have healthy snacks readily available, such as: Vegetable sticks with hummus. Greek yogurt. Fruit and nut trail mix. Eat balanced meals throughout the week. This includes: Fruit: 2-3 servings a day. Vegetables: 4-5 servings a day. Low-fat dairy: 2 servings a day. Fish, poultry, or lean meat: 1 serving a day. Beans and legumes: 2 or more servings a week. Nuts and seeds: 1-2 servings a day. Whole grains: 6-8 servings a day. Extra-virgin olive oil: 3-4 servings a day. Limit red meat and sweets to only a few servings a month. Lifestyle  Cook and eat  meals together with your family, when possible. Drink enough fluid to keep your urine pale yellow. Be physically active every day. This includes: Aerobic exercise like running or swimming. Leisure activities like gardening, walking, or housework. Get 7-8 hours of sleep each night. If recommended by your health care provider, drink red wine in moderation. This means 1 glass a day for nonpregnant women and 2 glasses a day for men. A glass of wine equals 5 oz (150 mL). What foods should I eat? Fruits Apples. Apricots. Avocado. Berries. Bananas. Cherries. Dates. Figs. Grapes. Lemons. Melon. Oranges. Peaches. Plums. Pomegranate. Vegetables Artichokes. Beets. Broccoli. Cabbage. Carrots. Eggplant. Green beans. Chard. Kale. Spinach. Onions. Leeks. Peas. Squash. Tomatoes. Peppers. Radishes. Grains Whole-grain pasta. Brown rice. Bulgur wheat. Polenta. Couscous. Whole-wheat bread. [place]. Meats and other proteins Beans. Almonds. Sunflower seeds. Pine nuts. Peanuts. [place]. Salmon. Scallops. Shrimp. [place]. Tilapia. Clams. Oysters. Eggs. Poultry without skin. Dairy Low-fat milk. Cheese. Greek  yogurt. Fats and oils Extra-virgin olive oil. Avocado oil. Grapeseed oil. Beverages Water. Red wine. Herbal tea. Sweets and desserts Greek yogurt with honey. Baked apples. Poached pears. Trail mix. Seasonings and condiments Basil. Cilantro. Coriander. Cumin. Mint. Parsley. Sage. Rosemary. Tarragon. Garlic. Oregano. Thyme. Pepper. Balsamic vinegar. Tahini. Hummus. Tomato sauce. Olives. Mushrooms. The items listed above may not be a complete list of foods and beverages you can eat. Contact a dietitian for more information. What foods should I limit? This is a list of foods that should be eaten rarely or only on special occasions. Fruits Fruit canned in syrup. Vegetables Deep-fried potatoes (french fries). Grains Prepackaged pasta or rice dishes. Prepackaged cereal with added sugar. Prepackaged snacks with added sugar. Meats and other proteins Beef. Pork. Lamb. Poultry with skin. Hot dogs. Berniece Salines. Dairy Ice cream. Sour cream. Whole milk. Fats and oils Butter. Canola oil. Vegetable oil. Beef fat (tallow). Lard. Beverages Juice. Sugar-sweetened soft drinks. Beer. Liquor and spirits. Sweets and desserts Cookies. Cakes. Pies. Candy. Seasonings and condiments Mayonnaise. Pre-made sauces and marinades. The items listed above may not be a complete list of foods and beverages you should limit. Contact a dietitian for more information. Summary The Mediterranean diet includes both food and lifestyle choices. Eat a variety of fresh fruits and vegetables, beans, nuts, seeds, and whole grains. Limit the amount of red meat and sweets that you eat. If recommended by your health care provider, drink red wine in moderation. This means 1 glass a day for nonpregnant women and 2 glasses a day for men. A glass of wine equals 5 oz (150 mL). This information is not intended to replace advice given to you by your health care provider. Make sure you discuss any questions you have with your health care  provider. Document Revised: 08/18/2019 Document Reviewed: 06/15/2019 Elsevier Patient Education  [place]

## 2022-02-06 NOTE — Telephone Encounter (Signed)
Any symptoms? Recommend to drop a urine sample ASAP, UA urine culture. Once she provides a sample, call Bactrim DS 1 p.o. twice daily ,# 6 tablets

## 2022-02-06 NOTE — Telephone Encounter (Signed)
Please advise 

## 2022-02-09 ENCOUNTER — Ambulatory Visit (INDEPENDENT_AMBULATORY_CARE_PROVIDER_SITE_OTHER): Payer: Medicare Other | Admitting: Internal Medicine

## 2022-02-09 ENCOUNTER — Encounter: Payer: Self-pay | Admitting: Internal Medicine

## 2022-02-09 VITALS — BP 132/60 | HR 71 | Temp 98.4°F | Resp 20 | Ht 61.0 in | Wt 148.0 lb

## 2022-02-09 DIAGNOSIS — J9611 Chronic respiratory failure with hypoxia: Secondary | ICD-10-CM | POA: Diagnosis not present

## 2022-02-09 DIAGNOSIS — R040 Epistaxis: Secondary | ICD-10-CM

## 2022-02-09 DIAGNOSIS — I10 Essential (primary) hypertension: Secondary | ICD-10-CM | POA: Diagnosis not present

## 2022-02-09 DIAGNOSIS — I5020 Unspecified systolic (congestive) heart failure: Secondary | ICD-10-CM | POA: Diagnosis not present

## 2022-02-09 DIAGNOSIS — J441 Chronic obstructive pulmonary disease with (acute) exacerbation: Secondary | ICD-10-CM | POA: Diagnosis not present

## 2022-02-09 DIAGNOSIS — R399 Unspecified symptoms and signs involving the genitourinary system: Secondary | ICD-10-CM

## 2022-02-09 DIAGNOSIS — N760 Acute vaginitis: Secondary | ICD-10-CM | POA: Diagnosis not present

## 2022-02-09 LAB — POC URINALSYSI DIPSTICK (AUTOMATED)
Bilirubin, UA: NEGATIVE
Blood, UA: NEGATIVE
Glucose, UA: POSITIVE — AB
Ketones, UA: NEGATIVE
Leukocytes, UA: NEGATIVE
Nitrite, UA: NEGATIVE
Protein, UA: NEGATIVE
Spec Grav, UA: 1.01 (ref 1.010–1.025)
Urobilinogen, UA: 0.2 E.U./dL
pH, UA: 6 (ref 5.0–8.0)

## 2022-02-09 MED ORDER — FLUCONAZOLE 150 MG PO TABS: 150.0000 mg | ORAL_TABLET | Freq: Every day | ORAL | 0 refills | Status: DC

## 2022-02-09 NOTE — Progress Notes (Unsigned)
Subjective:    Patient ID: Adrienne ShuckPatricia A Huy, female    DOB: 26-May-1961, 61 y.o.   MRN: [id number]  DOS:  02/09/2022 Type of visit - description: f/u  Admitted 01/23/2022, discharge 01/29/2022:  -Acute on chronic hypoxemic and hypercapnic by respiratory failure due to chronic diastolic CHF.  Cardiology eval the patient, received IV diuresis and improved. - PAF: New, noted for several hours during the admission, started Eliquis -Dopplers negative for DVT bilaterally. --DM: Uncontrolled.  01/30/2022: Seen here by me, was referred to the ER due to persistent nosebleed and left leg pain.  02/06/2022: Saw cardiology, felt to be euvolemic, for PAF was Rx to continue Eliquis, metoprolol. BP 108/50, slightly soft.  Wt Readings from Last 3 Encounters:  02/09/22 148 lb (67.1 kg)  02/06/22 146 lb (66.2 kg)  01/28/22 148 lb 2.4 oz (67.2 kg)     Review of Systems She is here with her husband. At this point she feels better. Still has occasional nosebleed, but has use afrin only one time in the last 3 days. Still have some cough without fever chills No chest pain no difficulty breathing Lower extremity edema is much improved.  Also, complaining of burning and a itchy feeling at the vaginal area (in and  outside) No vaginal discharge or vaginal bleeding.   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    Essential hypertension 09/04/2010   Qualifier: Diagnosis of  By: Drue NovelPaz MD, Nolon RodJose E.    GANGLION CYST 07/22/2010   Qualifier: Diagnosis of  By: Drue NovelPaz MD, Rosela Supak E.    GERD (gastroesophageal reflux disease) 05/23/2017   High cholesterol    Hyperlipidemia 10/12/2012   Hypertension    IBS (irritable bowel syndrome)    chronic Diarrhea   IBS ? 07/16/2008   Qualifier: Diagnosis of  By: Drue NovelPaz MD, Nolon RodJose E.    Macular degeneration, dry    Migraine    "long long time ago; maybe 20 yr ago" (09/01/2013)   MIGRAINE HEADACHE 03/13/2010   Qualifier: Diagnosis of  By: Drue NovelPaz MD, Nation Cradle E.    Mouth sores 04/15/2017   Neck pain 02/03/2011   Osteoarthritis    "back; goes down my right leg" (09/01/2013)   Subclinical hyperthyroidism 02/26/2018   Thyromegaly 12/04/2014   Type II diabetes mellitus (HCC) 2007    Past Surgical History:  Procedure Laterality Date   CARPAL TUNNEL RELEASE Bilateral ~ 2000   cataract surgery Bilateral    TONSILLECTOMY  07/27/1968   TUBAL LIGATION  07/27/1994    Current Outpatient Medications  Medication Instructions   albuterol (VENTOLIN HFA) 108 (90 Base) MCG/ACT inhaler 2 puffs, Inhalation, Every 6 hours PRN   amLODipine (NORVASC) 10 MG tablet TAKE 1 TABLET BY MOUTH  DAILY   apixaban (ELIQUIS) 5 mg, Oral, 2 times daily   atorvastatin (LIPITOR) 40 mg, Oral, Daily at bedtime   clonazePAM (KLONOPIN) 1 mg, Oral, 2  times daily,  rx by psychiatry   dapagliflozin propanediol (FARXIGA) 10 mg, Oral, Daily   divalproex (DEPAKOTE) 250-500 mg, Oral, See admin instructions, Take one tablet (250 mg) by mouth every morning and  (500 mg) at night   fluticasone furoate-vilanterol (BREO ELLIPTA) 100-25 MCG/ACT AEPB 1 puff, Inhalation, Daily   furosemide (LASIX) 80 mg, Oral, Daily   insulin degludec (TRESIBA) 60 Units, Subcutaneous, Daily at bedtime   ipratropium-albuterol (DUONEB) 0.5-2.5 (3) MG/3ML SOLN 3 mLs, Nebulization, 3 times daily PRN   ketoconazole (NIZORAL) 2 % cream 1  application , Topical, 2 times daily, For 10 days   Melatonin 20 mg, Oral, As needed   metoprolol tartrate (LOPRESSOR) 50 MG tablet TAKE 1 TABLET BY MOUTH TWICE  DAILY   OXYGEN 2 L/min, Inhalation, Continuous   oxymetazoline (AFRIN) 0.05 % nasal spray 1 spray, Each Nare, 2 times daily PRN   sodium fluoride (PREVIDENT 5000 DRY MOUTH) 1.1 % GEL dental gel 1 Application, dental, 2 times daily   tiZANidine (ZANAFLEX) 2 mg, Oral, 3 times daily   umeclidinium bromide (INCRUSE ELLIPTA) 62.5 MCG/ACT AEPB 1 puff, Inhalation, Daily   Vitamin D (Ergocalciferol) (DRISDOL) 50,000 Units, Oral, Every 7 days       Objective:   Physical Exam BP 132/60   Pulse 71   Temp 98.4 F (36.9 C) (Oral)   Resp 20   Ht 5\' 1"  (1.549 m)   Wt 148 lb (67.1 kg)   LMP 08/24/2015 (Exact Date)   SpO2 98% Comment: 2L/min  BMI 27.96 kg/m  General:   Well developed, NAD, BMI noted. HEENT:  Normocephalic . Face symmetric, atraumatic Nostril: Dried blood in both sides Lungs:  Slightly decreased breath sounds in the bases otherwise normal Normal respiratory effort, no intercostal retractions, no accessory muscle use. Heart: Seems regular today Lower extremities: no pretibial edema bilaterally  Skin: With her husband and my nurse in the room, I did a simple inspection of the genital area, external genitalia slightly erythematous.  No bleeding, no discharge. Neurologic:  alert & oriented X3.  Speech normal, gait appropriate for age and unassisted Psych--  Cognition and judgment appear intact.  Cooperative with normal attention span and concentration.  Behavior appropriate. No anxious or depressed appearing.      Assessment     Assessment ENDO: started to see Dr 08/26/2015 12/2017 DM  HTN -- dc lisinopril 06-2015>> lips swell x1 , also had stomatitis >> resolved  High cholesterol GI: ---IBS (diarrhea, unable to control BMs) ---Fatty Liver per CT 04-2020 Thyroid disease -Multinodular goiter, thyromegaly w/  dominant nodule: Negative BX 01-2015 - Hyperthyroidism, subclinical, DX 2019 --s/p thyroid ablation 2019 ? IBS.Chronic diarrhea DJD Psychiatry: Sees Dr. 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 Most recent labs show creatinine of 1.2, sodium 132, hemoglobin 12. Acute on chronic hypoxemic and hypercapnic by respiratory failure due to chronic diastolic CHF: Admitted to hospital, discharged 01/29/2022 After the admission, saw cardiology, she seems to be stable. Creatinine was trending down. Edema much improved.  Continue present care She continue with oxygen, without it she reports the O2 sat dropped to the 80s. Severe nosebleed: see LOV, went to the ER, prescribed AFRIN, still  has bleeding on and off.  Plan: Nonurgent ENT referral Leg pain: See last visit: Resolved. Vaginitis?  See symptom description, UA urine culture sent, I did not do a full female exam but I suspect vaginitis. Plan: Monistat is listed on her allergies.  She tolerates Diflucan, prescription sent . Social:  Parking permit signed RTC 3 months  Time spent 34 minutes, extensive chart review including a hospital admission, ER visit and a new problem (vaginitis)

## 2022-02-09 NOTE — Patient Instructions (Addendum)
Recommend to proceed with covid booster (bivalent) at your pharmacy.  Flu shot this fall  You are overdue to see Dr. Morrison Old (endocrinology) about your diabetes. Please call his office to schedule an appointment.   Use Diflucan 1 tablet a day for 2 days  Okay to use over-the-counter hydrocortisone for itching  Fax number [phone]    GO TO THE FRONT DESK, PLEASE SCHEDULE YOUR APPOINTMENTS Come back for   a checkup in 3 months

## 2022-02-10 DIAGNOSIS — E113293 Type 2 diabetes mellitus with mild nonproliferative diabetic retinopathy without macular edema, bilateral: Secondary | ICD-10-CM | POA: Diagnosis not present

## 2022-02-10 DIAGNOSIS — Z794 Long term (current) use of insulin: Secondary | ICD-10-CM | POA: Diagnosis not present

## 2022-02-10 DIAGNOSIS — E042 Nontoxic multinodular goiter: Secondary | ICD-10-CM | POA: Diagnosis not present

## 2022-02-10 DIAGNOSIS — E1165 Type 2 diabetes mellitus with hyperglycemia: Secondary | ICD-10-CM | POA: Diagnosis not present

## 2022-02-10 LAB — URINALYSIS, ROUTINE W REFLEX MICROSCOPIC
Bilirubin Urine: NEGATIVE
Ketones, ur: NEGATIVE
Nitrite: NEGATIVE
Specific Gravity, Urine: <=1.005 — AB (ref 1.000–1.030)
Total Protein, Urine: NEGATIVE
Urine Glucose: >=1000 — AB
Urobilinogen, UA: 0.2 (ref 0.0–1.0)
pH: 6.5 (ref 5.0–8.0)

## 2022-02-10 LAB — URINE CULTURE
MICRO NUMBER:: 13655614
SPECIMEN QUALITY:: ADEQUATE

## 2022-02-10 NOTE — Assessment & Plan Note (Signed)
Labs reviewed: Prior to being admitted to hospital creatinine was 1.0, sodium 139, hemoglobin 12.6. Most recent labs show creatinine of 1.2, sodium 132, hemoglobin 12. Acute on chronic hypoxemic and hypercapnic by respiratory failure due to chronic diastolic CHF: Admitted to hospital, discharged 01/29/2022 After the admission, saw cardiology, she seems to be stable. Creatinine was trending down. Edema much improved.  Continue present care She continue with oxygen, without it she reports the O2 sat dropped to the 80s. Severe nosebleed: see LOV, went to the ER, prescribed AFRIN, still has bleeding on and off.  Plan: Nonurgent ENT referral Leg pain: See last visit: Resolved. Vaginitis?  See symptom description, UA urine culture sent, I did not do a full female exam but I suspect vaginitis. Plan: Monistat is listed on her allergies.  She tolerates Diflucan, prescription sent . Social:  Parking permit signed RTC 3 months

## 2022-02-13 ENCOUNTER — Encounter: Payer: Self-pay | Admitting: Gastroenterology

## 2022-02-13 ENCOUNTER — Ambulatory Visit (INDEPENDENT_AMBULATORY_CARE_PROVIDER_SITE_OTHER): Payer: Medicare Other | Admitting: Gastroenterology

## 2022-02-13 ENCOUNTER — Other Ambulatory Visit (INDEPENDENT_AMBULATORY_CARE_PROVIDER_SITE_OTHER): Payer: Medicare Other

## 2022-02-13 VITALS — BP 108/50 | HR 70 | Ht 61.0 in | Wt 147.5 lb

## 2022-02-13 DIAGNOSIS — R159 Full incontinence of feces: Secondary | ICD-10-CM

## 2022-02-13 DIAGNOSIS — K580 Irritable bowel syndrome with diarrhea: Secondary | ICD-10-CM

## 2022-02-13 DIAGNOSIS — Z9981 Dependence on supplemental oxygen: Secondary | ICD-10-CM

## 2022-02-13 DIAGNOSIS — Z7901 Long term (current) use of anticoagulants: Secondary | ICD-10-CM | POA: Diagnosis not present

## 2022-02-13 DIAGNOSIS — K529 Noninfective gastroenteritis and colitis, unspecified: Secondary | ICD-10-CM

## 2022-02-13 DIAGNOSIS — R634 Abnormal weight loss: Secondary | ICD-10-CM | POA: Diagnosis not present

## 2022-02-13 DIAGNOSIS — Z86010 Personal history of colonic polyps: Secondary | ICD-10-CM

## 2022-02-13 DIAGNOSIS — R110 Nausea: Secondary | ICD-10-CM | POA: Diagnosis not present

## 2022-02-13 DIAGNOSIS — R112 Nausea with vomiting, unspecified: Secondary | ICD-10-CM | POA: Diagnosis not present

## 2022-02-13 DIAGNOSIS — R194 Change in bowel habit: Secondary | ICD-10-CM

## 2022-02-13 LAB — CBC WITH DIFFERENTIAL/PLATELET
Basophils Absolute: 0.1 10*3/uL (ref 0.0–0.1)
Basophils Relative: 0.8 % (ref 0.0–3.0)
Eosinophils Absolute: 0.1 10*3/uL (ref 0.0–0.7)
Eosinophils Relative: 1.5 % (ref 0.0–5.0)
HCT: 34.9 % — ABNORMAL LOW (ref 36.0–46.0)
Hemoglobin: 11.3 g/dL — ABNORMAL LOW (ref 12.0–15.0)
Lymphocytes Relative: 25.6 % (ref 12.0–46.0)
Lymphs Abs: 2.1 10*3/uL (ref 0.7–4.0)
MCHC: 32.3 g/dL (ref 30.0–36.0)
MCV: 92.8 fl (ref 78.0–100.0)
Monocytes Absolute: 0.6 10*3/uL (ref 0.1–1.0)
Monocytes Relative: 7.8 % (ref 3.0–12.0)
Neutro Abs: 5.3 10*3/uL (ref 1.4–7.7)
Neutrophils Relative %: 64.3 % (ref 43.0–77.0)
Platelets: 277 10*3/uL (ref 150.0–400.0)
RBC: 3.76 Mil/uL — ABNORMAL LOW (ref 3.87–5.11)
RDW: 14.1 % (ref 11.5–15.5)
WBC: 8.2 10*3/uL (ref 4.0–10.5)

## 2022-02-13 LAB — COMPREHENSIVE METABOLIC PANEL
ALT: 10 U/L (ref 0–35)
AST: 20 U/L (ref 0–37)
Albumin: 4.1 g/dL (ref 3.5–5.2)
Alkaline Phosphatase: 68 U/L (ref 39–117)
BUN: 25 mg/dL — ABNORMAL HIGH (ref 6–23)
CO2: 38 mEq/L — ABNORMAL HIGH (ref 19–32)
Calcium: 9.2 mg/dL (ref 8.4–10.5)
Chloride: 89 mEq/L — ABNORMAL LOW (ref 96–112)
Creatinine, Ser: 1.16 mg/dL (ref 0.40–1.20)
GFR: 50.74 mL/min — ABNORMAL LOW (ref >60.00)
Glucose, Bld: 157 mg/dL — ABNORMAL HIGH (ref 70–99)
Potassium: 4.1 mEq/L (ref 3.5–5.1)
Sodium: 136 mEq/L (ref 135–145)
Total Bilirubin: 0.3 mg/dL (ref 0.2–1.2)
Total Protein: 7.9 g/dL (ref 6.0–8.3)

## 2022-02-13 LAB — LIPASE: Lipase: 39 U/L (ref 11.0–59.0)

## 2022-02-13 LAB — C-REACTIVE PROTEIN: CRP: 1.5 mg/dL (ref 0.5–20.0)

## 2022-02-13 LAB — AMYLASE: Amylase: 26 U/L — ABNORMAL LOW (ref 27–131)

## 2022-02-13 LAB — SEDIMENTATION RATE: Sed Rate: 92 mm/hr — ABNORMAL HIGH (ref 0–30)

## 2022-02-13 NOTE — Patient Instructions (Addendum)
Your provider has requested that you go to the basement level for lab work before leaving today. Press "B" on the elevator. The lab is located at the first door on the left as you exit the elevator.  You can take over the counter Imodium up to 12 mg (6 tablets) daily.   Start over the counter Benefiber/Metamucil to bulk stools daily x 1 week then increase to twice daily.   If these regimens do not help then we will send in a prescription of Lomotil.  Due to recent changes in healthcare laws, you may see the results of your imaging and laboratory studies on MyChart before your provider has had a chance to review them.  We understand that in some cases there may be results that are confusing or concerning to you. Not all laboratory results come back in the same time frame and the provider may be waiting for multiple results in order to interpret others.  Please give [place] 48 hours in order for your provider to thoroughly review all the results before contacting the office for clarification of your results.   The [facility] providers would like to encourage you to use [facility] to communicate with providers for non-urgent requests or questions.  Due to long hold times on the telephone, sending your provider a message by [facility] may be a faster and more efficient way to get a response.  Please allow 48 business hours for a response.  Please remember that this is for non-urgent requests.
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
## 2022-02-15 DIAGNOSIS — R159 Full incontinence of feces: Secondary | ICD-10-CM | POA: Insufficient documentation

## 2022-02-15 DIAGNOSIS — R110 Nausea: Secondary | ICD-10-CM | POA: Insufficient documentation

## 2022-02-15 DIAGNOSIS — Z86010 Personal history of colonic polyps: Secondary | ICD-10-CM | POA: Insufficient documentation

## 2022-02-15 DIAGNOSIS — Z7901 Long term (current) use of anticoagulants: Secondary | ICD-10-CM | POA: Insufficient documentation

## 2022-02-15 DIAGNOSIS — R634 Abnormal weight loss: Secondary | ICD-10-CM | POA: Insufficient documentation

## 2022-02-15 DIAGNOSIS — Z9981 Dependence on supplemental oxygen: Secondary | ICD-10-CM | POA: Insufficient documentation

## 2022-02-15 DIAGNOSIS — K529 Noninfective gastroenteritis and colitis, unspecified: Secondary | ICD-10-CM | POA: Insufficient documentation

## 2022-02-15 DIAGNOSIS — R194 Change in bowel habit: Secondary | ICD-10-CM | POA: Insufficient documentation

## 2022-02-15 DIAGNOSIS — K580 Irritable bowel syndrome with diarrhea: Secondary | ICD-10-CM | POA: Insufficient documentation

## 2022-02-15 LAB — TISSUE TRANSGLUTAMINASE, IGA: (tTG) Ab, IgA: <1 U/mL

## 2022-02-15 LAB — IGA: Immunoglobulin A: 471 mg/dL — ABNORMAL HIGH (ref 70–320)

## 2022-02-16 ENCOUNTER — Other Ambulatory Visit: Payer: Medicare Other

## 2022-02-16 DIAGNOSIS — R159 Full incontinence of feces: Secondary | ICD-10-CM

## 2022-02-16 DIAGNOSIS — A048 Other specified bacterial intestinal infections: Secondary | ICD-10-CM | POA: Diagnosis not present

## 2022-02-16 DIAGNOSIS — R110 Nausea: Secondary | ICD-10-CM

## 2022-02-16 DIAGNOSIS — K529 Noninfective gastroenteritis and colitis, unspecified: Secondary | ICD-10-CM | POA: Diagnosis not present

## 2022-02-17 ENCOUNTER — Encounter: Payer: Self-pay | Admitting: Gastroenterology

## 2022-02-17 LAB — GI PROFILE, STOOL, PCR

## 2022-02-17 LAB — HELICOBACTER PYLORI  SPECIAL ANTIGEN
MICRO NUMBER:: 13685533
SPECIMEN QUALITY: ADEQUATE

## 2022-02-17 NOTE — Progress Notes (Signed)
Review of outside records to be scanned to the chart.  2012 colonoscopy [facility] 2, 4 to 6 mm polyps in the proximal ascending colon.  Resected and retrieved. Large external and internal hemorrhoids. Diarrhea of unknown cause without obvious cause.  Mucosa biopsied.  Pathology Colon polyps are tubular adenomas without high-grade dysplasia Random colon biopsies benign colon tissue  Recommendation for 3-year follow-up based on the notes

## 2022-02-18 ENCOUNTER — Encounter: Payer: Self-pay | Admitting: Internal Medicine

## 2022-02-18 DIAGNOSIS — J9611 Chronic respiratory failure with hypoxia: Secondary | ICD-10-CM

## 2022-02-18 NOTE — Telephone Encounter (Signed)
Order for oxygen/conserving device faxed to [employer].

## 2022-02-18 NOTE — Telephone Encounter (Signed)
Received fax confirmation

## 2022-02-19 ENCOUNTER — Other Ambulatory Visit: Payer: Self-pay

## 2022-02-19 DIAGNOSIS — D649 Anemia, unspecified: Secondary | ICD-10-CM

## 2022-02-19 DIAGNOSIS — R194 Change in bowel habit: Secondary | ICD-10-CM

## 2022-02-19 DIAGNOSIS — K529 Noninfective gastroenteritis and colitis, unspecified: Secondary | ICD-10-CM

## 2022-02-19 DIAGNOSIS — R634 Abnormal weight loss: Secondary | ICD-10-CM

## 2022-02-19 MED ORDER — DAPAGLIFLOZIN PROPANEDIOL 10 MG PO TABS: 10.0000 mg | ORAL_TABLET | Freq: Every day | ORAL | 0 refills | Status: DC

## 2022-02-19 MED ORDER — APIXABAN 5 MG PO TABS: 5.0000 mg | ORAL_TABLET | Freq: Two times a day (BID) | ORAL | 3 refills | Status: DC

## 2022-02-19 MED ORDER — FLUTICASONE FUROATE-VILANTEROL 100-25 MCG/ACT IN AEPB: INHALATION_SPRAY | RESPIRATORY_TRACT | 5 refills | Status: DC

## 2022-02-19 NOTE — Addendum Note (Signed)
Addended byConrad Palestine D on: 02/19/2022 09:47 AM   Modules accepted: Orders

## 2022-02-20 ENCOUNTER — Other Ambulatory Visit (INDEPENDENT_AMBULATORY_CARE_PROVIDER_SITE_OTHER): Payer: Medicare Other

## 2022-02-20 DIAGNOSIS — K529 Noninfective gastroenteritis and colitis, unspecified: Secondary | ICD-10-CM

## 2022-02-20 DIAGNOSIS — R634 Abnormal weight loss: Secondary | ICD-10-CM

## 2022-02-20 DIAGNOSIS — R194 Change in bowel habit: Secondary | ICD-10-CM | POA: Diagnosis not present

## 2022-02-20 DIAGNOSIS — D649 Anemia, unspecified: Secondary | ICD-10-CM | POA: Diagnosis not present

## 2022-02-20 LAB — VITAMIN B12: Vitamin B-12: 321 pg/mL (ref 211–911)

## 2022-02-20 LAB — IBC + FERRITIN
Ferritin: 41.5 ng/mL (ref 10.0–291.0)
Iron: 32 ug/dL — ABNORMAL LOW (ref 42–145)
Saturation Ratios: 9 % — ABNORMAL LOW (ref 20.0–50.0)
TIBC: 354.2 ug/dL (ref 250.0–450.0)
Transferrin: 253 mg/dL (ref 212.0–360.0)

## 2022-02-20 LAB — OVA AND PARASITE EXAMINATION
CONCENTRATE RESULT:: NONE SEEN
MICRO NUMBER:: 13685601
SPECIMEN QUALITY:: ADEQUATE
TRICHROME RESULT:: NONE SEEN

## 2022-02-20 LAB — FOLATE: Folate: 18.2 ng/mL (ref >5.9)

## 2022-02-23 ENCOUNTER — Other Ambulatory Visit: Payer: Self-pay

## 2022-02-23 ENCOUNTER — Other Ambulatory Visit: Payer: Medicare Other

## 2022-02-24 LAB — FECAL FAT, QUALITATIVE
Fat Qual Neutral, Stl: NORMAL
Fat Qual Total, Stl: NORMAL

## 2022-02-24 LAB — CALPROTECTIN, FECAL: Calprotectin, Fecal: 15 ug/g (ref 0–120)

## 2022-02-25 ENCOUNTER — Other Ambulatory Visit: Payer: Medicare Other

## 2022-02-25 ENCOUNTER — Encounter: Payer: Self-pay | Admitting: Internal Medicine

## 2022-02-25 DIAGNOSIS — K529 Noninfective gastroenteritis and colitis, unspecified: Secondary | ICD-10-CM | POA: Diagnosis not present

## 2022-02-27 ENCOUNTER — Ambulatory Visit: Payer: Medicare Other | Admitting: Cardiology

## 2022-02-27 DIAGNOSIS — E113293 Type 2 diabetes mellitus with mild nonproliferative diabetic retinopathy without macular edema, bilateral: Secondary | ICD-10-CM | POA: Diagnosis not present

## 2022-02-27 DIAGNOSIS — Z794 Long term (current) use of insulin: Secondary | ICD-10-CM | POA: Diagnosis not present

## 2022-03-02 ENCOUNTER — Ambulatory Visit (INDEPENDENT_AMBULATORY_CARE_PROVIDER_SITE_OTHER): Payer: Medicare Other | Admitting: Internal Medicine

## 2022-03-02 ENCOUNTER — Encounter: Payer: Self-pay | Admitting: Internal Medicine

## 2022-03-02 VITALS — BP 138/62 | HR 49 | Temp 98.1°F | Resp 16 | Ht 61.0 in | Wt 146.2 lb

## 2022-03-02 DIAGNOSIS — R21 Rash and other nonspecific skin eruption: Secondary | ICD-10-CM

## 2022-03-02 DIAGNOSIS — M79605 Pain in left leg: Secondary | ICD-10-CM

## 2022-03-02 MED ORDER — BETAMETHASONE DIPROPIONATE AUG 0.05 % EX CREA: TOPICAL_CREAM | Freq: Two times a day (BID) | CUTANEOUS | 0 refills | Status: DC

## 2022-03-02 NOTE — Patient Instructions (Signed)
Apply the cream twice a day  Please see your orthopedic doctor about the left leg pain

## 2022-03-02 NOTE — Progress Notes (Unsigned)
Subjective:    Patient ID: Adrienne Bennett, female    DOB: 30-Oct-1960, 61 y.o.   MRN: [id number]  DOS:  03/02/2022 Type of visit - description: Acute, here with her husband  Reports extremely pruritic rash at the pretibial area. No fever or chills. Her husband has no rash like this.  Also, left leg pain has resurfaced.   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    Essential hypertension 09/04/2010   Qualifier: Diagnosis of  By: [name] MD, [name].    GANGLION CYST 07/22/2010   Qualifier: Diagnosis of  By: [name] MD, Journe Hallmark E.    GERD (gastroesophageal reflux disease) 05/23/2017   High cholesterol    Hyperlipidemia 10/12/2012   Hypertension    IBS (irritable bowel syndrome)    chronic Diarrhea   IBS ? 07/16/2008   Qualifier: Diagnosis of  By: [name] MD, [name].    Macular degeneration, dry    Migraine    "long long time ago; maybe 20 yr ago" (09/01/2013)   MIGRAINE HEADACHE 03/13/2010   Qualifier: Diagnosis of  By: [name] MD, Namiah Dunnavant E.    Mouth sores 04/15/2017   Neck pain 02/03/2011   Osteoarthritis    "back; goes down my right leg" (09/01/2013)   Subclinical hyperthyroidism 02/26/2018   Thyromegaly 12/04/2014   Type II diabetes mellitus (HCC) 2007    Past Surgical History:  Procedure Laterality Date   CARPAL TUNNEL RELEASE Bilateral ~ 2000    cataract surgery Bilateral    TONSILLECTOMY  07/27/1968   TUBAL LIGATION  07/27/1994    Current Outpatient Medications  Medication Instructions   albuterol (VENTOLIN HFA) 108 (90 Base) MCG/ACT inhaler 2 puffs, Inhalation, Every 6 hours PRN   amLODipine (NORVASC) 10 MG tablet TAKE 1 TABLET BY MOUTH  DAILY   apixaban (ELIQUIS) 5 mg, Oral, 2 times daily   atorvastatin (LIPITOR) 40 mg, Oral, Daily at bedtime   clonazePAM (KLONOPIN) 1 mg, Oral, 2 times daily,  rx by psychiatry   dapagliflozin propanediol (FARXIGA) 10 mg, Oral, Daily   divalproex (DEPAKOTE) 250-500 mg, Oral, See admin instructions, Take one tablet (250 mg) by mouth every morning and  (500 mg) at night   fluconazole (DIFLUCAN) 150 mg, Oral, Daily   fluticasone furoate-vilanterol (BREO ELLIPTA) 100-25 MCG/ACT AEPB one puff daily.   furosemide (LASIX) 80 mg, Oral, Daily   insulin degludec (TRESIBA) 60 Units, Subcutaneous, Daily at bedtime   ipratropium-albuterol (DUONEB) 0.5-2.5 (3) MG/3ML SOLN 3 mLs, Nebulization, 3 times daily PRN   Melatonin 20 mg, Oral, As needed   metFORMIN (GLUCOPHAGE-XR) 1,000 mg, Oral, 2 times daily   metoprolol tartrate (LOPRESSOR) 50 MG tablet TAKE 1 TABLET BY MOUTH TWICE  DAILY   OXYGEN 2 L/min, Inhalation, Continuous   oxymetazoline (AFRIN) 0.05 % nasal spray 1 spray, 2 times daily PRN   sodium fluoride (PREVIDENT  5000 DRY MOUTH) 1.1 % GEL dental gel 1 Application, dental, 2 times daily   tiZANidine (ZANAFLEX) 2 mg, Oral, 3 times daily       Objective:   Physical Exam BP 138/62   Pulse (!) 49   Temp 98.1 F (36.7 C) (Oral)   Resp 16   Ht 5\' 1"  (1.549 m)   Wt 146 lb 4 oz (66.3 kg)   LMP 08/24/2015 (Exact Date)   SpO2 91%   BMI 27.63 kg/m  General:   Well developed, NAD, BMI noted. HEENT:  Normocephalic . Face symmetric, atraumatic   Skin: Symmetric rash at the pretibial areas with evidence of previous scratching.  The rash is not consistent with vasculitis.   Neurologic:  alert &  oriented X3.  Speech normal  Psych--  Cognition and judgment appear intact.  Cooperative with normal attention span and concentration.  Behavior appropriate. No anxious or depressed appearing.       Assessment     Assessment ENDO: started to see Dr 08/26/2015 12/2017 DM  HTN -- dc lisinopril 06-2015>> lips swell x1 , also had stomatitis >> resolved  High cholesterol GI: ---IBS (diarrhea, unable to control BMs) ---Fatty Liver per CT 04-2020 Thyroid disease -Multinodular goiter, thyromegaly w/ dominant nodule: Negative BX 01-2015 - Hyperthyroidism, subclinical, DX 2019 --s/p thyroid ablation 2019 ? IBS.Chronic diarrhea DJD Psychiatry: Sees Dr. 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  On clinical grounds, does not look like vasculitis, no blisters.  Chiggers?  Plan: Treat empirically with potent local steroids, see prescription. Left leg pain: Reports sxs have resurfaced, on Tylenol, not in distress, legs are not swelling, plans to see Ortho.  Agreed History of nosebleeds: ENT appointment pending Chronic diarrhea, feces incontinence, IBS, weight loss, Saw GI 02/13/2022: Labs were negative from pancreatitis, celiac disease, normal calprotectin and fecal. Had mild anemia, B12, folic acid, ferritin was normal but had a low iron saturation and was recommended ferrous gluconate. They are considering a colonoscopy and EGD.     7-17 Labs reviewed: Prior to being admitted to hospital creatinine was 1.0, sodium 139, hemoglobin 12.6. Most recent labs show creatinine of 1.2, sodium 132, hemoglobin 12. Acute on chronic hypoxemic and hypercapnic by respiratory failure due to chronic  diastolic CHF: Admitted to hospital, discharged 01/29/2022 After the admission, saw cardiology, she seems to be stable. Creatinine was trending down. Edema much improved.  Continue present care She continue with oxygen, without it she reports the O2 sat dropped to the 80s. Severe nosebleed: see LOV, went to the ER, prescribed AFRIN, still has bleeding on and off.  Plan: Nonurgent ENT referral Leg pain: See last visit: Resolved. Vaginitis?  See symptom description, UA urine culture sent, I did not do a full female exam but I suspect vaginitis. Plan: Monistat is listed on her allergies.  She tolerates Diflucan, prescription sent . Social:  Parking permit signed RTC 3 months

## 2022-03-03 NOTE — Assessment & Plan Note (Signed)
Rash: Seems to be a localized allergic/atopic reaction.  On clinical grounds, does not look like vasculitis or cellulitis, no blisters.  Chiggers?  Plan: Treat empirically with potent local steroids, see prescription. Left leg pain: Reports sxs have resurfaced, on Tylenol, not in distress, legs are not swelling, plans to see Ortho.  Agreed History of nosebleeds: ENT appointment pending Chronic diarrhea, feces incontinence, IBS, weight loss, Saw GI 02/13/2022: Labs were negative from pancreatitis, celiac disease, normal calprotectin. Had mild anemia, B12, folic acid, ferritin were normal but had a low iron saturation and was recommended ferrous gluconate. They are considering a colonoscopy and EGD.

## 2022-03-04 LAB — PANCREATIC ELASTASE, FECAL: Pancreatic Elastase-1, Stool: 88 mcg/g — ABNORMAL LOW

## 2022-03-05 DIAGNOSIS — M79604 Pain in right leg: Secondary | ICD-10-CM | POA: Diagnosis not present

## 2022-03-05 DIAGNOSIS — M5136 Other intervertebral disc degeneration, lumbar region: Secondary | ICD-10-CM | POA: Diagnosis not present

## 2022-03-09 ENCOUNTER — Other Ambulatory Visit: Payer: Self-pay

## 2022-03-09 DIAGNOSIS — R899 Unspecified abnormal finding in specimens from other organs, systems and tissues: Secondary | ICD-10-CM

## 2022-03-09 DIAGNOSIS — K529 Noninfective gastroenteritis and colitis, unspecified: Secondary | ICD-10-CM

## 2022-03-09 MED ORDER — PANCRELIPASE (LIP-PROT-AMYL) 36000-114000 UNITS PO CPEP: ORAL_CAPSULE | ORAL | 11 refills | Status: DC

## 2022-03-12 DIAGNOSIS — I10 Essential (primary) hypertension: Secondary | ICD-10-CM | POA: Diagnosis not present

## 2022-03-12 DIAGNOSIS — I5020 Unspecified systolic (congestive) heart failure: Secondary | ICD-10-CM | POA: Diagnosis not present

## 2022-03-12 DIAGNOSIS — J441 Chronic obstructive pulmonary disease with (acute) exacerbation: Secondary | ICD-10-CM | POA: Diagnosis not present

## 2022-03-13 ENCOUNTER — Other Ambulatory Visit: Payer: Self-pay

## 2022-03-13 ENCOUNTER — Telehealth: Payer: Self-pay | Admitting: Gastroenterology

## 2022-03-13 MED ORDER — PANCRELIPASE (LIP-PROT-AMYL) 36000-114000 UNITS PO CPEP: ORAL_CAPSULE | ORAL | 11 refills | Status: DC

## 2022-03-13 NOTE — Telephone Encounter (Signed)
Left message on machine to call back  

## 2022-03-13 NOTE — Telephone Encounter (Signed)
Inbound cal from patient requesting a call back in regarding to Creon medication. Patient states medication is priced at $400. Please give a call back to further advise.  Thank you

## 2022-03-16 NOTE — Telephone Encounter (Signed)
The pt has been advised that I spoke with Alonna Buckler CMA and we are suppose to get Zenpep samples today.  I will call her as soon as I get samples for her to take

## 2022-03-17 DIAGNOSIS — R040 Epistaxis: Secondary | ICD-10-CM | POA: Diagnosis not present

## 2022-03-17 DIAGNOSIS — Z9981 Dependence on supplemental oxygen: Secondary | ICD-10-CM | POA: Diagnosis not present

## 2022-03-17 DIAGNOSIS — Z7901 Long term (current) use of anticoagulants: Secondary | ICD-10-CM | POA: Diagnosis not present

## 2022-03-18 MED ORDER — ZENPEP 40000-126000 UNITS PO CPEP: ORAL_CAPSULE | ORAL | 0 refills | Status: DC

## 2022-03-18 NOTE — Telephone Encounter (Signed)
3 boxes of Zenpep have been left at the front desk for pt pick up.  The pt has been advised.

## 2022-03-19 DIAGNOSIS — M542 Cervicalgia: Secondary | ICD-10-CM | POA: Diagnosis not present

## 2022-03-19 DIAGNOSIS — M4726 Other spondylosis with radiculopathy, lumbar region: Secondary | ICD-10-CM | POA: Diagnosis not present

## 2022-03-19 DIAGNOSIS — M5136 Other intervertebral disc degeneration, lumbar region: Secondary | ICD-10-CM | POA: Diagnosis not present

## 2022-03-19 DIAGNOSIS — M48061 Spinal stenosis, lumbar region without neurogenic claudication: Secondary | ICD-10-CM | POA: Diagnosis not present

## 2022-03-19 DIAGNOSIS — M47812 Spondylosis without myelopathy or radiculopathy, cervical region: Secondary | ICD-10-CM | POA: Diagnosis not present

## 2022-03-19 DIAGNOSIS — M47816 Spondylosis without myelopathy or radiculopathy, lumbar region: Secondary | ICD-10-CM | POA: Diagnosis not present

## 2022-03-24 ENCOUNTER — Telehealth: Payer: Self-pay | Admitting: Gastroenterology

## 2022-03-24 DIAGNOSIS — M5136 Other intervertebral disc degeneration, lumbar region: Secondary | ICD-10-CM | POA: Diagnosis not present

## 2022-03-24 DIAGNOSIS — M5386 Other specified dorsopathies, lumbar region: Secondary | ICD-10-CM | POA: Diagnosis not present

## 2022-03-24 DIAGNOSIS — M6281 Muscle weakness (generalized): Secondary | ICD-10-CM | POA: Diagnosis not present

## 2022-03-24 DIAGNOSIS — R293 Abnormal posture: Secondary | ICD-10-CM | POA: Diagnosis not present

## 2022-03-24 DIAGNOSIS — Z794 Long term (current) use of insulin: Secondary | ICD-10-CM | POA: Diagnosis not present

## 2022-03-24 DIAGNOSIS — R2689 Other abnormalities of gait and mobility: Secondary | ICD-10-CM | POA: Diagnosis not present

## 2022-03-24 DIAGNOSIS — E1169 Type 2 diabetes mellitus with other specified complication: Secondary | ICD-10-CM | POA: Diagnosis not present

## 2022-03-24 NOTE — Telephone Encounter (Signed)
Patient called  states she is having BM's without even noticing, it just comes out at any time of day. Seeking advise.

## 2022-03-24 NOTE — Telephone Encounter (Signed)
Laboratories as outlined below Stool studies as outlined below Initiate bulking fiber Benefiber or Metamucil once daily for a week and then increase to twice daily May use Imodium 4 mg at beginning of day and use up to 12 mg daily After 1 to 2 weeks, of consistent use of Imodium, if still having issues please let us know and we will consider Lomotil use Consider SIBO breath testing in future Consider IBS-D treatment in the future Consider EPI evaluation (atrophic appearing pancreas a few years ago on prior imaging) Diagnostic colonoscopy/endoscopy to be considered, willing to perform on anticoagulation if necessary but we need to get cardiology okay before we pursue this

## 2022-03-24 NOTE — Telephone Encounter (Signed)
Left message on machine to call back  

## 2022-03-25 NOTE — Telephone Encounter (Signed)
Left message on machine to call back  

## 2022-03-26 NOTE — Telephone Encounter (Signed)
Left message on machine to call back  

## 2022-03-27 NOTE — Telephone Encounter (Signed)
Line busy

## 2022-03-31 ENCOUNTER — Encounter: Payer: Self-pay | Admitting: Internal Medicine

## 2022-03-31 NOTE — Telephone Encounter (Signed)
I have been unable to reach the pt by phone.  Will await further communication from the pt.  

## 2022-04-01 ENCOUNTER — Ambulatory Visit: Payer: Medicare Other | Attending: Cardiology | Admitting: Cardiology

## 2022-04-01 ENCOUNTER — Encounter: Payer: Self-pay | Admitting: Cardiology

## 2022-04-01 VITALS — BP 98/68 | HR 76 | Ht 61.0 in | Wt 140.0 lb

## 2022-04-01 DIAGNOSIS — I10 Essential (primary) hypertension: Secondary | ICD-10-CM | POA: Diagnosis not present

## 2022-04-01 DIAGNOSIS — J441 Chronic obstructive pulmonary disease with (acute) exacerbation: Secondary | ICD-10-CM

## 2022-04-01 DIAGNOSIS — I5032 Chronic diastolic (congestive) heart failure: Secondary | ICD-10-CM | POA: Diagnosis not present

## 2022-04-01 DIAGNOSIS — E785 Hyperlipidemia, unspecified: Secondary | ICD-10-CM | POA: Diagnosis not present

## 2022-04-01 DIAGNOSIS — R0609 Other forms of dyspnea: Secondary | ICD-10-CM

## 2022-04-01 DIAGNOSIS — I480 Paroxysmal atrial fibrillation: Secondary | ICD-10-CM | POA: Insufficient documentation

## 2022-04-01 MED ORDER — FUROSEMIDE 80 MG PO TABS: 80.0000 mg | ORAL_TABLET | Freq: Every day | ORAL | 3 refills | Status: DC
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
## 2022-04-01 NOTE — Patient Instructions (Signed)
Medication Instructions:  Your physician recommends that you continue on your current medications as directed. Please refer to the Current Medication list given to you today.  *If you need a refill on your cardiac medications before your next appointment, please call your pharmacy*   Lab Work: ProBNP, [facility]- [facility] [address]nd Floor  [address] If you have labs (blood work) drawn today and your tests are completely normal, you will receive your results only by: MyChart Message (if you have MyChart) OR A paper copy in the mail If you have any lab test that is abnormal or we need to change your treatment, we will call you to review the results.   Testing/Procedures: None Ordered   Follow-Up: At [facility], you and your health needs are our priority.  As part of our continuing mission to provide you with exceptional heart care, we have created designated Provider Care Teams.  These Care Teams include your primary Cardiologist (physician) and Advanced Practice Providers (APPs -  Physician Assistants and Nurse Practitioners) who all work together to provide you with the care you need, when you need it.  We recommend signing up for the patient portal called "MyChart".  Sign up information is provided on this After Visit Summary.  MyChart is used to connect with patients for Virtual Visits (Telemedicine).  Patients are able to view lab/test results, encounter notes, upcoming appointments, etc.  Non-urgent messages can be sent to your provider as well.   To learn more about what you can do with MyChart, go to [email or web address].    Your next appointment:   2 month(s)  The format for your next appointment:   In Person  Provider:   [name], MD    Other Instructions NA

## 2022-04-01 NOTE — Addendum Note (Signed)
Addended by: Baldo Ash D on: 04/01/2022 04:55 PM   Modules accepted: Orders

## 2022-04-02 DIAGNOSIS — M5116 Intervertebral disc disorders with radiculopathy, lumbar region: Secondary | ICD-10-CM | POA: Diagnosis not present

## 2022-04-02 DIAGNOSIS — I5032 Chronic diastolic (congestive) heart failure: Secondary | ICD-10-CM | POA: Diagnosis not present

## 2022-04-02 DIAGNOSIS — M48061 Spinal stenosis, lumbar region without neurogenic claudication: Secondary | ICD-10-CM | POA: Diagnosis not present

## 2022-04-02 DIAGNOSIS — J441 Chronic obstructive pulmonary disease with (acute) exacerbation: Secondary | ICD-10-CM | POA: Diagnosis not present

## 2022-04-02 DIAGNOSIS — M4726 Other spondylosis with radiculopathy, lumbar region: Secondary | ICD-10-CM | POA: Diagnosis not present

## 2022-04-02 DIAGNOSIS — M419 Scoliosis, unspecified: Secondary | ICD-10-CM | POA: Diagnosis not present

## 2022-04-02 DIAGNOSIS — R0609 Other forms of dyspnea: Secondary | ICD-10-CM | POA: Diagnosis not present

## 2022-04-03 ENCOUNTER — Encounter: Payer: Self-pay | Admitting: Gastroenterology

## 2022-04-03 ENCOUNTER — Ambulatory Visit: Payer: Medicare Other | Admitting: Gastroenterology

## 2022-04-03 VITALS — BP 100/68 | HR 65 | Ht 61.0 in | Wt 141.8 lb

## 2022-04-03 DIAGNOSIS — Z7901 Long term (current) use of anticoagulants: Secondary | ICD-10-CM | POA: Diagnosis not present

## 2022-04-03 DIAGNOSIS — K580 Irritable bowel syndrome with diarrhea: Secondary | ICD-10-CM

## 2022-04-03 DIAGNOSIS — K589 Irritable bowel syndrome without diarrhea: Secondary | ICD-10-CM

## 2022-04-03 DIAGNOSIS — K8681 Exocrine pancreatic insufficiency: Secondary | ICD-10-CM

## 2022-04-03 DIAGNOSIS — R159 Full incontinence of feces: Secondary | ICD-10-CM | POA: Diagnosis not present

## 2022-04-03 DIAGNOSIS — R194 Change in bowel habit: Secondary | ICD-10-CM

## 2022-04-03 DIAGNOSIS — K529 Noninfective gastroenteritis and colitis, unspecified: Secondary | ICD-10-CM

## 2022-04-03 LAB — BASIC METABOLIC PANEL
BUN/Creatinine Ratio: 17 (ref 12–28)
BUN: 19 mg/dL (ref 8–27)
CO2: 24 mmol/L (ref 20–29)
Calcium: 9.5 mg/dL (ref 8.7–10.3)
Chloride: 92 mmol/L — ABNORMAL LOW (ref 96–106)
Creatinine, Ser: 1.13 mg/dL — ABNORMAL HIGH (ref 0.57–1.00)
Glucose: 105 mg/dL — ABNORMAL HIGH (ref 70–99)
Potassium: 5.6 mmol/L — ABNORMAL HIGH (ref 3.5–5.2)
Sodium: 137 mmol/L (ref 134–144)
eGFR: 55 mL/min/{1.73_m2} — ABNORMAL LOW (ref >59)

## 2022-04-03 LAB — PRO B NATRIURETIC PEPTIDE: NT-Pro BNP: 893 pg/mL — ABNORMAL HIGH (ref 0–287)

## 2022-04-03 MED ORDER — DIPHENOXYLATE-ATROPINE 2.5-0.025 MG PO TABS: 1.0000 | ORAL_TABLET | Freq: Four times a day (QID) | ORAL | 2 refills | Status: DC | PRN

## 2022-04-03 NOTE — Patient Instructions (Addendum)
You have been given a testing kit to check for small intestine bacterial overgrowth (SIBO) which is completed by a company named [facility]. Make sure to return your test in the mail using the return mailing label given to you along with the kit. Your demographic and insurance information have already been sent to the company and they should be in contact with you over the next 1-2 weeks regarding this test. [facility] will collect an upfront charge of $99.74 for commercial insurance plans and $209.74 is you are paying cash. Make sure to discuss with [facility] PRIOR to having the test to see if they have gotten information from your insurance company as to how much your testing will cost out of pocket, if any. Please keep in mind that you will be getting a call from phone number [phone] or a similar number. If you do not hear from them within this time frame, please call our office at [phone] or call [facility] directly at [phone].   We have given you samples of the following medication to take:  Zenpep 40,000 units-Take 2 capsules with each meal (please let us know if this is helpful and we can send a prescription to the pharmacy)  Lomotil- Take 1 tablet up to 4 times daily as needed. . Please follow up with Tye Savoy, NP on 05/15/22 at 10:00 am. _____________________________________________________ If you are age 17 or older, your body mass index should be between 23-30. Your Body mass index is 26.79 kg/m. If this is out of the aforementioned range listed, please consider follow up with your Primary Care Provider.  If you are age 30 or younger, your body mass index should be between 19-25. Your Body mass index is 26.79 kg/m. If this is out of the aformentioned range listed, please consider follow up with your Primary Care Provider.  _____________________________________________________The [facility] providers would like to encourage you to use [facility] to  communicate with providers for non-urgent requests or questions.  Due to long hold times on the telephone, sending your provider a message by [facility] may be a faster and more efficient way to get a response.  Please allow 48 business hours for a response.  Please remember that this is for non-urgent requests.  ____________________________________________________ Due to recent changes in healthcare laws, you may see the results of your imaging and laboratory studies on MyChart before your provider has had a chance to review them.  We understand that in some cases there may be results that are confusing or concerning to you. Not all laboratory results come back in the same time frame and the provider may be waiting for multiple results in order to interpret others.  Please give [place] 48 hours in order for your provider to thoroughly review all the results before contacting the office for clarification of your results.

## 2022-04-04 ENCOUNTER — Encounter: Payer: Self-pay | Admitting: Gastroenterology

## 2022-04-04 DIAGNOSIS — K8681 Exocrine pancreatic insufficiency: Secondary | ICD-10-CM | POA: Insufficient documentation
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
## 2022-04-07 DIAGNOSIS — M5416 Radiculopathy, lumbar region: Secondary | ICD-10-CM | POA: Diagnosis not present

## 2022-04-07 DIAGNOSIS — M5136 Other intervertebral disc degeneration, lumbar region: Secondary | ICD-10-CM | POA: Diagnosis not present

## 2022-04-08 ENCOUNTER — Telehealth: Payer: Self-pay | Admitting: Gastroenterology

## 2022-04-08 NOTE — Telephone Encounter (Signed)
Patient called, states she is confused on how to take her ZENPEP medication. Requesting a call back on [phone]. Please call to advise.

## 2022-04-09 NOTE — Telephone Encounter (Signed)
Patient wanted to make sure it was ok to open the Zenpep capsule and sprinkle it on applesauce.  I checked with the rep to make sure it was ok and he clarified that it was acceptable to take it that way.  I relayed this to patient and reiterated she was to take it as she was eating each meal - three times a day.  Patient agreed.

## 2022-04-12 DIAGNOSIS — J441 Chronic obstructive pulmonary disease with (acute) exacerbation: Secondary | ICD-10-CM | POA: Diagnosis not present

## 2022-04-12 DIAGNOSIS — I5020 Unspecified systolic (congestive) heart failure: Secondary | ICD-10-CM | POA: Diagnosis not present

## 2022-04-12 DIAGNOSIS — I10 Essential (primary) hypertension: Secondary | ICD-10-CM | POA: Diagnosis not present

## 2022-04-13 ENCOUNTER — Other Ambulatory Visit: Payer: Self-pay | Admitting: Internal Medicine

## 2022-04-14 DIAGNOSIS — M5416 Radiculopathy, lumbar region: Secondary | ICD-10-CM | POA: Diagnosis not present

## 2022-04-14 DIAGNOSIS — M5136 Other intervertebral disc degeneration, lumbar region: Secondary | ICD-10-CM | POA: Diagnosis not present

## 2022-04-16 DIAGNOSIS — M7989 Other specified soft tissue disorders: Secondary | ICD-10-CM | POA: Diagnosis not present

## 2022-04-16 DIAGNOSIS — M25532 Pain in left wrist: Secondary | ICD-10-CM | POA: Diagnosis not present

## 2022-04-28 ENCOUNTER — Encounter: Payer: Self-pay | Admitting: Internal Medicine

## 2022-04-28 ENCOUNTER — Other Ambulatory Visit: Payer: Self-pay | Admitting: Internal Medicine

## 2022-04-28 MED ORDER — FLUCONAZOLE 150 MG PO TABS: 150.0000 mg | ORAL_TABLET | Freq: Every day | ORAL | 0 refills | Status: DC

## 2022-05-04 ENCOUNTER — Encounter: Payer: Self-pay | Admitting: Internal Medicine

## 2022-05-04 ENCOUNTER — Telehealth: Payer: Self-pay | Admitting: Cardiology

## 2022-05-04 ENCOUNTER — Telehealth: Payer: Self-pay

## 2022-05-04 DIAGNOSIS — R0609 Other forms of dyspnea: Secondary | ICD-10-CM

## 2022-05-04 DIAGNOSIS — E875 Hyperkalemia: Secondary | ICD-10-CM

## 2022-05-04 DIAGNOSIS — I10 Essential (primary) hypertension: Secondary | ICD-10-CM

## 2022-05-04 NOTE — Telephone Encounter (Signed)
Patient notified of the following results per Dr.k, aware labs are needed. She may have this draw done at Dr. Larose Kells 's office. Advise to have results sent too [place] but if this does not work out then she hs the option of having her labs done at our new location in [place]. Location details provided to the patient. Order on file

## 2022-05-04 NOTE — Telephone Encounter (Signed)
Message addressed.

## 2022-05-04 NOTE — Telephone Encounter (Signed)
Pt returning a call for lab results  

## 2022-05-04 NOTE — Telephone Encounter (Signed)
-----   Message from Park Liter, MD sent at 05/01/2022 12:32 PM EDT ----- Now we probably need to get another proBNP and Chem-7 ----- Message ----- From: Darrel Reach, CMA Sent: 04/28/2022   2:30 PM EDT To: Park Liter, MD  Are you wanting to repeat Chem-7? ----- Message ----- From: Park Liter, MD Sent: 04/14/2022  10:28 AM EDT To: Tyler Pita, RN  Please check Chem-7 on her her potassium a little high before.

## 2022-05-05 ENCOUNTER — Ambulatory Visit (INDEPENDENT_AMBULATORY_CARE_PROVIDER_SITE_OTHER): Payer: Medicare Other | Admitting: Family Medicine

## 2022-05-05 ENCOUNTER — Encounter: Payer: Self-pay | Admitting: Family Medicine

## 2022-05-05 ENCOUNTER — Other Ambulatory Visit: Payer: Self-pay | Admitting: Family Medicine

## 2022-05-05 VITALS — BP 112/64 | HR 75 | Temp 98.4°F | Ht 61.0 in | Wt 138.0 lb

## 2022-05-05 DIAGNOSIS — U071 COVID-19: Secondary | ICD-10-CM

## 2022-05-05 DIAGNOSIS — J441 Chronic obstructive pulmonary disease with (acute) exacerbation: Secondary | ICD-10-CM | POA: Diagnosis not present

## 2022-05-05 LAB — POC COVID19 BINAXNOW: SARS Coronavirus 2 Ag: POSITIVE — AB

## 2022-05-05 MED ORDER — QVAR REDIHALER 40 MCG/ACT IN AERB: 2.0000 | INHALATION_SPRAY | Freq: Two times a day (BID) | RESPIRATORY_TRACT | 0 refills | Status: DC

## 2022-05-05 MED ORDER — MOLNUPIRAVIR EUA 200MG CAPSULE: 4.0000 | ORAL_CAPSULE | Freq: Two times a day (BID) | ORAL | 0 refills | Status: DC

## 2022-05-05 NOTE — Telephone Encounter (Signed)
Patient informed of provider instructions. She is going to call the other [facility] in [facility] to see if this medication is available. Will call back

## 2022-05-05 NOTE — Patient Instructions (Addendum)
Continue to push fluids, practice good hand hygiene, and cover your mouth if you cough.  If you start having fevers, shaking or shortness of breath, seek immediate care.  OK to take Tylenol 1000 mg (2 extra strength tabs) or 975 mg (3 regular strength tabs) every 6 hours as needed.  Take the inhaler for the next few weeks to help your breathing along with the Breo.   Let us know if you need anything.

## 2022-05-05 NOTE — Progress Notes (Signed)
Chief Complaint  Patient presents with   Cough    Congestion Headache     Adrienne Bennett here for URI complaints. Here w spouse.   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),  recovered self-mutilator   Bipolar affective disorder ([place])    PTSD, agoraphobiam ,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    GERD (gastroesophageal reflux disease) 05/23/2017   High cholesterol    Hyperlipidemia 10/12/2012   Hypertension    IBS (irritable bowel syndrome)    chronic Diarrhea   IBS ? 07/16/2008   Qualifier: Diagnosis of  By: Larose Kells MD, [place]    Macular degeneration, dry    Migraine    "long long time ago; maybe 20 yr ago" (09/01/2013)   MIGRAINE HEADACHE 03/13/2010   Qualifier: Diagnosis of  By: Larose Kells MD, [place] sores 04/15/2017   Neck pain 02/03/2011   Osteoarthritis    "back; goes down my right leg" (09/01/2013)   Subclinical hyperthyroidism 02/26/2018   Thyromegaly 12/04/2014   Type II  diabetes mellitus ([place]) 2007    Objective BP 112/64 (BP Location: Left Arm, Patient Position: Sitting, Cuff Size: Normal)   Pulse 75   Temp 98.4 F (36.9 C) (Oral)   Ht 5\' 1"  (1.549 m)   Wt 138 lb (62.6 kg)   LMP 08/24/2015 (Exact Date)   SpO2 98%   BMI 26.07 kg/m  General: Awake, alert, appears stated age HEENT: AT, [place], ears patent b/l and TM's neg, nares patent w/o discharge, pharynx pink and without exudates, MMM Neck: No masses or asymmetry Heart: RRR Lungs: CTAB tho decreased air flow, no accessory muscle use Psych: Age appropriate judgment and insight, normal mood and affect  COPD exacerbation (HCC) - Plan: beclomethasone (QVAR REDIHALER) 40 MCG/ACT inhaler, POC COVID-19  COVID-19 - Plan: molnupiravir EUA (LAGEVRIO) 200 mg CAPS capsule, POC COVID-19  Exacerbation of chronic issue caused by Covid. Tested + for covid. Molnupiravir. ICS along with ICS/LABA inhaler for 2 weeks for COPD flare.  Continue to push fluids, practice good hand hygiene, cover mouth when coughing. F/u prn. If starting to experience fevers, shaking, or shortness of breath, seek immediate care. Pt and spouse voiced understanding and agreement to the plan.  [place], DO 05/05/22 3:41 PM

## 2022-05-06 ENCOUNTER — Telehealth: Payer: Self-pay

## 2022-05-06 DIAGNOSIS — U071 COVID-19: Secondary | ICD-10-CM

## 2022-05-06 DIAGNOSIS — J441 Chronic obstructive pulmonary disease with (acute) exacerbation: Secondary | ICD-10-CM

## 2022-05-06 MED ORDER — QVAR REDIHALER 40 MCG/ACT IN AERB: 2.0000 | INHALATION_SPRAY | Freq: Two times a day (BID) | RESPIRATORY_TRACT | 0 refills | Status: DC

## 2022-05-06 MED ORDER — MOLNUPIRAVIR EUA 200MG CAPSULE
4.0000 | ORAL_CAPSULE | Freq: Two times a day (BID) | ORAL | 0 refills | Status: DC
  Filled 2022-05-07: qty 40, 5d supply, fill #0

## 2022-05-06 NOTE — Telephone Encounter (Signed)
Called left message to call back 

## 2022-05-06 NOTE — Telephone Encounter (Signed)
Caller Name Dola Phone Number [phone] Call Type Message Only Information Provided Reason for Call Returning a Call from the Office Initial Comment Caller states she needs to speak with Shirlean Mylar about her medication. Additional Comment DOB Jul 31, 1960 Office hours provided. Disp. Time Disposition Final User 05/05/2022 5:15:10 PM General Information Provided Yes Wynona Canes Call Closed By: Wynona Canes Transaction Date/Time: 05/05/2022 5:11:14 PM (ET)

## 2022-05-06 NOTE — Telephone Encounter (Signed)
[facility] [address] on [address].   beclomethasone (QVAR REDIHALER) 40 MCG/ACT inhaler   molnupiravir EUA (LAGEVRIO) 200 mg CAPS capsule

## 2022-05-06 NOTE — Telephone Encounter (Signed)
Patient did call back and sent to [facility] in [place] on [address]

## 2022-05-06 NOTE — Telephone Encounter (Signed)
Sent and patient made aware. 

## 2022-05-07 ENCOUNTER — Other Ambulatory Visit (HOSPITAL_BASED_OUTPATIENT_CLINIC_OR_DEPARTMENT_OTHER): Payer: Self-pay

## 2022-05-11 NOTE — Progress Notes (Signed)
Contacted [facility] in regards to SIBO testing given to patient on 04/03/22. Per Cristie Hem, patient has not returned SIBO testing kit for resulting at this time.

## 2022-05-12 ENCOUNTER — Encounter: Payer: Self-pay | Admitting: Internal Medicine

## 2022-05-12 ENCOUNTER — Ambulatory Visit (INDEPENDENT_AMBULATORY_CARE_PROVIDER_SITE_OTHER): Payer: Medicare Other | Admitting: Internal Medicine

## 2022-05-12 VITALS — BP 126/62 | HR 70 | Temp 98.4°F | Resp 20 | Ht 61.0 in | Wt 139.0 lb

## 2022-05-12 DIAGNOSIS — I5032 Chronic diastolic (congestive) heart failure: Secondary | ICD-10-CM

## 2022-05-12 DIAGNOSIS — K8681 Exocrine pancreatic insufficiency: Secondary | ICD-10-CM | POA: Diagnosis not present

## 2022-05-12 DIAGNOSIS — N1831 Chronic kidney disease, stage 3a: Secondary | ICD-10-CM | POA: Diagnosis not present

## 2022-05-12 DIAGNOSIS — J9620 Acute and chronic respiratory failure, unspecified whether with hypoxia or hypercapnia: Secondary | ICD-10-CM

## 2022-05-12 DIAGNOSIS — J441 Chronic obstructive pulmonary disease with (acute) exacerbation: Secondary | ICD-10-CM | POA: Diagnosis not present

## 2022-05-12 DIAGNOSIS — F3170 Bipolar disorder, currently in remission, most recent episode unspecified: Secondary | ICD-10-CM

## 2022-05-12 DIAGNOSIS — E010 Iodine-deficiency related diffuse (endemic) goiter: Secondary | ICD-10-CM | POA: Diagnosis not present

## 2022-05-12 DIAGNOSIS — U071 COVID-19: Secondary | ICD-10-CM

## 2022-05-12 DIAGNOSIS — E1122 Type 2 diabetes mellitus with diabetic chronic kidney disease: Secondary | ICD-10-CM

## 2022-05-12 DIAGNOSIS — I5020 Unspecified systolic (congestive) heart failure: Secondary | ICD-10-CM | POA: Diagnosis not present

## 2022-05-12 DIAGNOSIS — R918 Other nonspecific abnormal finding of lung field: Secondary | ICD-10-CM | POA: Diagnosis not present

## 2022-05-12 DIAGNOSIS — E7800 Pure hypercholesterolemia, unspecified: Secondary | ICD-10-CM

## 2022-05-12 DIAGNOSIS — I10 Essential (primary) hypertension: Secondary | ICD-10-CM | POA: Diagnosis not present

## 2022-05-12 LAB — LIPID PANEL
Cholesterol: 100 mg/dL (ref 0–200)
HDL: 33.6 mg/dL — ABNORMAL LOW (ref >39.00)
LDL Cholesterol: 35 mg/dL (ref 0–99)
NonHDL: 66.32
Total CHOL/HDL Ratio: 3
Triglycerides: 157 mg/dL — ABNORMAL HIGH (ref 0.0–149.0)
VLDL: 31.4 mg/dL (ref 0.0–40.0)

## 2022-05-12 LAB — BASIC METABOLIC PANEL
BUN: 16 mg/dL (ref 6–23)
CO2: 37 mEq/L — ABNORMAL HIGH (ref 19–32)
Calcium: 8.9 mg/dL (ref 8.4–10.5)
Chloride: 93 mEq/L — ABNORMAL LOW (ref 96–112)
Creatinine, Ser: 0.91 mg/dL (ref 0.40–1.20)
GFR: 67.78 mL/min (ref >60.00)
Glucose, Bld: 211 mg/dL — ABNORMAL HIGH (ref 70–99)
Potassium: 4.4 mEq/L (ref 3.5–5.1)
Sodium: 138 mEq/L (ref 135–145)

## 2022-05-12 NOTE — Progress Notes (Signed)
Subjective:    Patient ID: Adrienne Bennett, female    DOB: 1960-12-18, 61 y.o.   MRN: [id number]  DOS:  05/12/2022 Type of visit - description: Follow-up, here with her husband.  Recently diagnosed with COVID, feels better. No short of breath as long as she uses oxygen. No fever or chills.   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    GERD (gastroesophageal reflux disease) 05/23/2017   High cholesterol    Hyperlipidemia 10/12/2012   Hypertension    IBS (irritable bowel syndrome)    chronic Diarrhea   IBS ? 07/16/2008   Qualifier: Diagnosis of  By: Larose Kells MD, [place]    Macular degeneration, dry    Migraine    "long long time ago; maybe 20 yr ago" (09/01/2013)   MIGRAINE HEADACHE 03/13/2010   Qualifier: Diagnosis of  By: Larose Kells MD, [place] sores 04/15/2017   Neck pain 02/03/2011   Osteoarthritis    "back; goes down my right leg" (09/01/2013)   Subclinical hyperthyroidism 02/26/2018   Thyromegaly 12/04/2014   Type II diabetes mellitus ([place]) 2007    Past Surgical History:  Procedure Laterality Date   CARPAL TUNNEL RELEASE Bilateral ~ 2000   cataract surgery Bilateral     TONSILLECTOMY  07/27/1968   TUBAL LIGATION  07/27/1994    Current Outpatient Medications  Medication Instructions   albuterol (VENTOLIN HFA) 108 (90 Base) MCG/ACT inhaler 2 puffs, Inhalation, Every 6 hours PRN   amLODipine (NORVASC) 10 mg, Oral, Daily   apixaban (ELIQUIS) 5 mg, Oral, 2 times daily   atorvastatin (LIPITOR) 40 mg, Oral, Daily at bedtime   augmented betamethasone dipropionate (DIPROLENE-AF) 0.05 % cream Topical, 2 times daily   beclomethasone (QVAR REDIHALER) 40 MCG/ACT inhaler 2 puffs, Inhalation, 2 times daily   clonazePAM (KLONOPIN) 1 mg, Oral, 2 times daily,  rx by psychiatry   dapagliflozin propanediol (FARXIGA) 10 mg, Oral, Daily   diphenoxylate-atropine (LOMOTIL) 2.5-0.025 MG tablet 1 tablet, Oral, 4 times daily PRN   divalproex (DEPAKOTE) 250-500 mg, Oral, See admin instructions, Take one tablet (250 mg) by mouth every morning and  (500 mg) at night   fluconazole (DIFLUCAN) 150 mg, Oral, Daily   fluticasone furoate-vilanterol (BREO ELLIPTA) 100-25 MCG/ACT AEPB one puff daily.   furosemide (LASIX) 80 mg, Oral, Daily   insulin degludec (TRESIBA) 20 Units, Subcutaneous, Daily at bedtime   ipratropium-albuterol (DUONEB) 0.5-2.5 (3) MG/3ML SOLN 3 mLs, Nebulization, 3 times daily PRN   Lagevrio 800 mg, Oral, 2 times daily   lipase/protease/amylase (CREON) 36000 UNITS CPEP capsule Take 2 capsules (72,000 Units total) by mouth 3 (three) times daily with meals  AND 1 capsule (36,000 Units total) 2 (two) times daily. With snacks.   Melatonin 20 mg, Oral, As needed   metFORMIN (GLUCOPHAGE-XR) 1,000 mg, Oral, 2 times daily   metoprolol tartrate (LOPRESSOR) 50 MG tablet TAKE 1 TABLET BY MOUTH TWICE  DAILY   OXYGEN 2 L/min, Inhalation, Continuous   oxymetazoline (AFRIN) 0.05 % nasal spray 1 spray, Each Nare, 2 times daily PRN   Pancrelipase, Lip-Prot-Amyl, (ZENPEP) 40000-126000 units CPEP 2 caps with every meal and 1 with snacks.   sodium fluoride (PREVIDENT 5000 DRY MOUTH) 1.1 %  GEL dental gel 1 Application, dental, 2 times daily   tiZANidine (ZANAFLEX) 2 mg, Oral, 3 times daily       Objective:   Physical Exam BP 126/62   Pulse 70   Temp 98.4 F (36.9 C) (Oral)   Resp 20   Ht 5\' 1"  (1.549 m)   Wt 139 lb (63 kg)   LMP 08/24/2015 (Exact Date)   SpO2 98% Comment: 2L/min  BMI 26.26 kg/m  General:   Well developed, NAD, BMI noted. HEENT:  Normocephalic . Face symmetric, atraumatic. Neck: + R thyromegaly noted.  Not tender, not nodular. Lungs:  CTA B Normal respiratory effort, no intercostal retractions, no accessory muscle use. Heart: RRR,  no murmur.  Lower extremities: no pretibial edema bilaterally  Skin: Not pale. Not jaundice Neurologic:  alert & oriented X3.  Speech normal, gait appropriate for age and unassisted Psych--  Cognition and judgment appear intact.  Cooperative with normal attention span and concentration.  Behavior appropriate. No anxious or depressed appearing.      Assessment     Assessment ENDO: started to see Dr 08/26/2015 12/2017 DM  HTN -- dc lisinopril 06-2015>> lips swell x1 , also had stomatitis >> resolved  High cholesterol GI: ---IBS (diarrhea, unable to control BMs) ---Fatty Liver per CT 04-2020 Thyroid disease -Multinodular goiter, thyromegaly w/ dominant nodule: Negative BX 01-2015 - Hyperthyroidism, subclinical, DX 2019 --s/p thyroid ablation 2019 ? IBS.Chronic diarrhea DJD Psychiatry: Sees Dr. 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  Otherwise continue amlodipine, metoprolol, BP seems well controlled. Hyperlipidemia: On atorvastatin.  Check FLP. DM: On metformin and July 2023.  Endocrinology stopped insulin due to  low sugars. Thyroid disease:  h/o hypothyroidism, s/p thyroid ablation 2019.  Recent CT of the chest 11-2021 showed thyromegaly.  Exam is confirmatory.  She is follow-up by Endo, CT report printed, recommend to discuss with Endo. Chronic diarrhea, feces incontinence, IBS, weight loss, Saw GI 04/03/2022, she continue with diarrhea, due to low fecal elastase was Rx pancreatic enzymes but they were very expensive.  Samples were provided as a trial.  Considering endoscopies. Patient reports symptoms are not better with pancreatic replacement therapy.  To see GI soon. COVID: Finishing molnupiravir, feeling better.  The Pulmonary nodules: Tiny 3 mm right upper lobe nodules noted incidentally on CT chest 11/2021, she is a non-smoker, low risk, no follow-up is indicated. Bipolar: request valproic acid level, to be send topsych Preventive care: Recovering from COVID, recommend COVID-vaccine and flu shot in a month and RSV in 6 weeks. RTC 4 months

## 2022-05-12 NOTE — Patient Instructions (Addendum)
Please share the results of the CAT scan with your endocrinologist, it showed that your thyroid gland is enlarged and the physical exam today is confirmatory.  Vaccines I recommend:   Covid booster and flu shot in 1 month  RSV vaccine if so desired in 6 weeks    Check the  blood pressure regularly BP GOAL is between 110/65 and  135/85. If it is consistently higher or lower, let me know      GO TO THE LAB : Get the blood work     [place], [place] back for a checkup in 4 to 5 months      Per our records you are due for your diabetic eye exam. Please contact your eye doctor to schedule an appointment. Please have them send copies of your office visit notes to [place]. Our fax number is [phone]. If you need a referral to an eye doctor please let us know.

## 2022-05-12 NOTE — Assessment & Plan Note (Addendum)
HTN Last potassium 5.6, recheck today.  Otherwise continue amlodipine, metoprolol, BP seems well controlled. Hyperlipidemia: On atorvastatin.  Check FLP. DM: On metformin and [place].  Endocrinology stopped insulin due to low sugars. Thyroid disease:  h/o hypothyroidism, s/p thyroid ablation 2019.  Recent CT of the chest 11-2021 showed thyromegaly.  Exam is confirmatory.  She is follow-up by Endo, CT report printed, recommend to discuss with Endo. Chronic diarrhea, feces incontinence, IBS, weight loss, Saw GI 04/03/2022, she continue with diarrhea, due to low fecal elastase was Rx pancreatic enzymes but they were very expensive.  Samples were provided as a trial.  Considering endoscopies. Patient reports symptoms are not better with pancreatic replacement therapy.  To see GI soon. COVID: Finishing molnupiravir, feeling better.  The Pulmonary nodules: Tiny 3 mm right upper lobe nodules noted incidentally on CT chest 11/2021, she is a non-smoker, low risk, no follow-up is indicated. Bipolar: request valproic acid level, to be send topsych Preventive care: Recovering from [facility], recommend COVID-vaccine and flu shot in a month and RSV in 6 weeks. RTC 4 months

## 2022-05-13 DIAGNOSIS — Z794 Long term (current) use of insulin: Secondary | ICD-10-CM | POA: Diagnosis not present

## 2022-05-13 DIAGNOSIS — E042 Nontoxic multinodular goiter: Secondary | ICD-10-CM | POA: Diagnosis not present

## 2022-05-13 DIAGNOSIS — E113293 Type 2 diabetes mellitus with mild nonproliferative diabetic retinopathy without macular edema, bilateral: Secondary | ICD-10-CM | POA: Diagnosis not present

## 2022-05-13 DIAGNOSIS — E1169 Type 2 diabetes mellitus with other specified complication: Secondary | ICD-10-CM | POA: Diagnosis not present

## 2022-05-13 LAB — HEMOGLOBIN A1C: Hemoglobin A1C: 7

## 2022-05-13 LAB — VALPROIC ACID LEVEL: Valproic Acid Lvl: 54 mg/L (ref 50.0–100.0)

## 2022-05-15 ENCOUNTER — Ambulatory Visit: Payer: Medicare Other | Admitting: Nurse Practitioner

## 2022-05-15 ENCOUNTER — Encounter: Payer: Self-pay | Admitting: Nurse Practitioner

## 2022-05-15 VITALS — BP 92/60 | HR 42 | Ht 61.0 in | Wt 138.1 lb

## 2022-05-15 DIAGNOSIS — K8681 Exocrine pancreatic insufficiency: Secondary | ICD-10-CM

## 2022-05-15 DIAGNOSIS — K529 Noninfective gastroenteritis and colitis, unspecified: Secondary | ICD-10-CM | POA: Diagnosis not present

## 2022-05-15 MED ORDER — DIPHENOXYLATE-ATROPINE 2.5-0.025 MG PO TABS: 1.0000 | ORAL_TABLET | Freq: Four times a day (QID) | ORAL | 3 refills | Status: DC | PRN

## 2022-05-15 NOTE — Progress Notes (Signed)
Attending Physician's Attestation   I have reviewed the chart.   I agree with the Advanced Practitioner's note, impression, and recommendations with any updates as below. She will likely need repeat endoscopic evaluation if the above interventions are not helpful.  We may consider Pancreaze, a newer enzyme, that may have more ability to get insurance coverage.  I will forward to RN Gerarda Fraction, to see if that may be possible and transition to 2 pills with each meal of 37K and 1 pill 37K with each snack.   Justice Britain, MD [facility] Advanced Endoscopy Office # [phone]

## 2022-05-15 NOTE — Patient Instructions (Signed)
We have sent the following medications to your pharmacy for you to pick up at your convenience: Lomotil 1 tablet up to 4 times daily as needed for diarrhea ______________________________________________________  Please purchase the following medications over the counter and take as directed: Benefiber- Take 2-3 times daily  _______________________________________________________  You have a testing kit to check for small intestine bacterial overgrowth (SIBO) which is completed by a company named [facility]. Make sure to return your test in the mail using the return mailing label given to you along with the kit. Your demographic and insurance information have already been sent to the company and they should be in contact with you over the next 1-2 weeks regarding this test. [facility] will collect an upfront charge of $99.74 for commercial insurance plans and $209.74 is you are paying cash. Make sure to discuss with [facility] PRIOR to having the test to see if they have gotten information from your insurance company as to how much your testing will cost out of pocket, if any. Please keep in mind that you will be getting a call from phone number [phone] or a similar number. If you do not hear from them within this time frame, please call our office at [phone] or call [facility] directly at [phone].   _____________________________________________________  If you are age 29 or older, your body mass index should be between 23-30. Your Body mass index is 26.1 kg/m. If this is out of the aforementioned range listed, please consider follow up with your Primary Care Provider.  If you are age 55 or younger, your body mass index should be between 19-25. Your Body mass index is 26.1 kg/m. If this is out of the aformentioned range listed, please consider follow up with your Primary Care Provider.   ________________________________________________________  The [facility]  providers would like to encourage you to use [facility] to communicate with providers for non-urgent requests or questions.  Due to long hold times on the telephone, sending your provider a message by [facility] may be a faster and more efficient way to get a response.  Please allow 48 business hours for a response.  Please remember that this is for non-urgent requests.  _______________________________________________________  Due to recent changes in healthcare laws, you may see the results of your imaging and laboratory studies on MyChart before your provider has had a chance to review them.  We understand that in some cases there may be results that are confusing or concerning to you. Not all laboratory results come back in the same time frame and the provider may be waiting for multiple results in order to interpret others.  Please give [place] 48 hours in order for your provider to thoroughly review all the results before contacting the office for clarification of your results.

## 2022-05-15 NOTE — Progress Notes (Signed)
Chief Complaint:  follow up on chronic loose stool    Assessment &  Plan   # 61 yo female with chronic loose stool with fecal incontinence, EPI and ? IBS-D  Increase benefiber to 2-3 times a daily.  Increase lomotil to QID ( only taking once daily). Will refill She will check to see if insurance will cover SIBO test and also contact the [place] on further instructions on how to complete the test ( she couldn't figure it out). If she is able to do the test and it is positive then we will treat accordingly.  Will have to hold off on pancreatic enzyme replacement. She cannot afford them and says she was turned down by pharmaceutical assistance program. I did recommend a low fat diet.  I held off on updating imaging of the pancreas until we can see if above interventions are helpful Follow up with Dr. Rush Landmark December   HPI   Patient is a 61 year old female known to Dr. Rush Landmark with a past medical history of CHF, atrial fibrillation on Eliquis, COPD, hypertension, HLD, diabetes, chronic diarrhea reported as IBS D but also with recently diagnosed exocrine pancreatic insufficiency, hepatic steatosis  Patient is being followed by Dr. Rush Landmark for EPI with chronic diarrhea, fecal incontinence, possible IBS.  04/03/22 last office visit with Dr. Rush Landmark    Please refer to that office note for details.  In summary, she was still having fecal urgency and incontinence despite imodium and fiber. She wasn't able to start pancreatic enzymes due to cost. Plan was to give her samples of Zenpap and see if effective.   She was to continue fiber 2-3 times daily. Consider updating pancreas imaging.  Consider SIBO testing. Consider treating with Xifaxan for IBS-D.   If symptoms  continued she could need EGD and colonoscopy with random colon biopsies.   Interval History:  She is here with her husband upset that she couldn't see Dr. Rush Landmark. Diarrhea and fecal incontinence has not  improved. She is having about 6 loose BMs a day. Rarely, if ever will she have a formed stool.  Has to change her underpants several times a day due to fecal incontinence. However, she is taking Benefiber only once a day. She didn't start the Zenpap ( didn't want to start something she couldn't afford). Also, only taking Lomotil only once daily ( it was prescribed QID as needed).  She went through the patient assistance program for PERT ( for Zenpep?). Apparently their income disqualified them. Additionally,  she has been recently put on several expensive medications which has been a financial strain.   She brings in the [facility] SIBO test . She couldn't figure out how to perform the test. Furthermore she is concerned that insurance will not cover the test.    GI Procedures and Studies  2012 colonoscopy Two 4 to 6 mm polyps in the proximal ascending colon.  Resected and retrieved. Large external and internal hemorrhoids. Diarrhea of unknown cause without obvious cause.  Mucosa biopsied Pathology unclear as we do not have any records otherwise   Labs:     Latest Ref Rng & Units 02/13/2022    3:07 PM 01/30/2022    1:54 PM 01/28/2022    4:20 AM  CBC  WBC 4.0 - 10.5 K/uL 8.2  8.4  10.0   Hemoglobin 12.0 - 15.0 g/dL 11.3  12.0  11.5   Hematocrit 36.0 - 46.0 % 34.9  37.5  36.5   Platelets 150.0 -  400.0 K/uL 277.0  363  261        Latest Ref Rng & Units 02/13/2022    3:07 PM 01/30/2022    1:54 PM 01/28/2022    4:20 AM  Hepatic Function  Total Protein 6.0 - 8.3 g/dL 7.9  8.9  7.8   Albumin 3.5 - 5.2 g/dL 4.1  3.5  3.3   AST 0 - 37 U/L 20  37  28   ALT 0 - 35 U/L 10  26  29    Alk Phosphatase 39 - 117 U/L 68  114  118   Total Bilirubin 0.2 - 1.2 mg/dL 0.3  0.4  0.5      Past Medical History:  Diagnosis Date   Annual physical exam 07/13/2012   Bipolar affective disorder ([place])    PTSD, agoraphobiam ,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),  recovered  self-mutilator   Bipolar affective disorder ([place])    PTSD, agoraphobiam ,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    GERD (gastroesophageal reflux disease) 05/23/2017   High cholesterol    Hyperlipidemia 10/12/2012   Hypertension    IBS (irritable bowel syndrome)    chronic Diarrhea   IBS ? 07/16/2008   Qualifier: Diagnosis of  By: Larose Kells MD, [place]    Macular degeneration, dry    Migraine    "long long time ago; maybe 20 yr ago" (09/01/2013)   MIGRAINE HEADACHE 03/13/2010   Qualifier: Diagnosis of  By: Larose Kells MD, [place] sores 04/15/2017   Neck pain 02/03/2011   Osteoarthritis    "back; goes down my right leg" (09/01/2013)   Subclinical hyperthyroidism 02/26/2018   Thyromegaly 12/04/2014   Type II diabetes mellitus ([place]) 2007    Past Surgical History:  Procedure Laterality Date   CARPAL TUNNEL RELEASE Bilateral ~ 2000   cataract surgery Bilateral    TONSILLECTOMY  07/27/1968   TUBAL LIGATION  07/27/1994    Current Medications, Allergies, Family History and Social History were reviewed in [employer] record.     Current Outpatient Medications  Medication Sig Dispense Refill   albuterol (VENTOLIN HFA) 108 (90 Base) MCG/ACT inhaler Inhale 2 puffs into the lungs every 6 (six) hours as needed for wheezing or shortness of breath. 8 g 2   amLODipine (NORVASC) 10 MG tablet Take 1 tablet (10 mg total) by mouth daily. 90 tablet 1   apixaban (ELIQUIS) 5 MG TABS tablet Take 1  tablet (5 mg total) by mouth 2 (two) times daily. 60 tablet 3   atorvastatin (LIPITOR) 40 MG tablet Take 1 tablet (40 mg total) by mouth at bedtime. 90 tablet 1   augmented betamethasone dipropionate (DIPROLENE-AF) 0.05 % cream Apply topically 2 (two) times daily. 60 g 0   beclomethasone (QVAR REDIHALER) 40 MCG/ACT inhaler Inhale 2 puffs into the lungs 2 (two) times daily. 1 each 0   clonazePAM (KLONOPIN) 1 MG tablet Take 1 mg by  mouth 2 (two) times daily.  rx by psychiatry     dapagliflozin propanediol (FARXIGA) 10 MG TABS tablet Take 1 tablet (10 mg total) by mouth daily. 30 tablet 0   diphenoxylate-atropine (LOMOTIL) 2.5-0.025 MG tablet Take 1 tablet by mouth 4 (four) times daily as needed for diarrhea or loose stools. 30 tablet 2   divalproex (DEPAKOTE) 250 MG DR tablet Take 250-500 mg by mouth See admin instructions. Take one tablet (250 mg) by mouth every morning and  (500 mg) at night     fluticasone furoate-vilanterol (BREO ELLIPTA) 100-25 MCG/ACT AEPB one puff daily. (Patient taking differently: Inhale 1 puff into the lungs daily. one puff daily.) 28 each 5   furosemide (LASIX) 80 MG tablet Take 1 tablet (80 mg total) by mouth daily. 90 tablet 3   ipratropium-albuterol (DUONEB) 0.5-2.5 (3) MG/3ML SOLN Take 3 mLs by nebulization 3 (three) times daily as needed. (Patient taking differently: Take 3 mLs by nebulization 3 (three) times daily as needed (shortness of breath/wheezing).) 30 mL 0   lipase/protease/amylase (CREON) 36000 UNITS CPEP capsule Take 2 capsules (72,000 Units total) by mouth 3 (three) times daily with meals AND 1 capsule (36,000 Units total) 2 (two) times daily. With snacks. 240 capsule 11   Melatonin 10 MG TABS Take 20 mg by mouth as needed (sleep).     metFORMIN (GLUCOPHAGE-XR) 500 MG 24 hr tablet Take 1,000 mg by mouth 2 (two) times daily.     metoprolol tartrate (LOPRESSOR) 50 MG tablet TAKE 1 TABLET BY MOUTH TWICE  DAILY (Patient taking differently: Take 50 mg by mouth 2  (two) times daily.) 180 tablet 1   OXYGEN Inhale 2 L/min into the lungs continuous.     oxymetazoline (AFRIN) 0.05 % nasal spray Place 1 spray into both nostrils 2 (two) times daily as needed (nose bleed).     Pancrelipase, Lip-Prot-Amyl, (ZENPEP) 40000-126000 units CPEP 2 caps with every meal and 1 with snacks. 36 capsule 0   sodium fluoride (PREVIDENT 5000 DRY MOUTH) 1.1 % GEL dental gel Place 1 Application onto teeth 2 (two) times daily.     tiZANidine (ZANAFLEX) 2 MG tablet Take 1 tablet (2 mg total) by mouth 3 (three) times daily. 21 tablet 0   No current facility-administered medications for this visit.    Review of Systems: No chest pain. No shortness of breath. No urinary complaints.    Physical Exam  Wt Readings from Last 3 Encounters:  05/12/22 139 lb (63 kg)  05/05/22 138 lb (62.6 kg)  04/03/22 141 lb 12.8 oz (64.3 kg)    LMP 08/24/2015 (Exact Date)  Constitutional:  Generally well appearing female in no acute distress. Psychiatric: Pleasant. Normal mood and affect. Behavior is normal. EENT: Pupils normal.  Conjunctivae are normal. No scleral icterus. Neck supple.  Cardiovascular: Normal rate, regular rhythm.  Pulmonary/chest: Effort normal and breath sounds normal. No wheezing, rales or rhonchi. Abdominal: Soft, nondistended, nontender. Bowel sounds active throughout.  No hepatomegaly. Neurological: Alert and oriented to person place and time. Skin: Skin is warm and dry. No rashes noted.  Tye Savoy, NP  05/15/2022, 8:31 AM  I spent 30 minutes total reviewing records, obtaining history, performing exam, counseling patient and documenting visit / findings.   Cc:  Colon Branch, MD

## 2022-05-18 ENCOUNTER — Telehealth: Payer: Self-pay

## 2022-05-18 MED ORDER — PANCREAZE 37000-97300 UNITS PO CPEP: ORAL_CAPSULE | ORAL | 6 refills | Status: DC

## 2022-05-18 NOTE — Telephone Encounter (Signed)
The pt will call her insurance and see if the CT scan is covered and call back if she wishes to proceed with the CT.  She is not able to afford any of the enzymes.

## 2022-05-18 NOTE — Telephone Encounter (Signed)
Left message on machine to call back  

## 2022-05-18 NOTE — Telephone Encounter (Signed)
I have reviewed the chart.    I agree with the Advanced Practitioner's note, impression, and recommendations with any updates as below. She will likely need repeat endoscopic evaluation if the above interventions are not helpful.  We may consider Pancreaze, a newer enzyme, that may have more ability to get insurance coverage.  I will forward to RN Gerarda Fraction, to see if that may be possible and transition to 2 pills with each meal of 37K and 1 pill 37K with each snack.     Justice Britain, MD [facility] Advanced Endoscopy Office # [phone]

## 2022-05-18 NOTE — Telephone Encounter (Signed)
Unfortunately, I am not sure we have much else to offer unless we can get a prior authorization to be accepted. Would go ahead and try to put in a prior authorization for Creon and see what happens. There are no other alternatives at this point. If it is denies, or she still cannot afford it then it is unfortunate. At some point, she needs an updated imaging study as well (have a CT order pended) to ensure we are not missing anything else in the Pancreas with the EPI being found.  Thanks. GM

## 2022-05-18 NOTE — Telephone Encounter (Signed)
Patient called back stating that her insurance will not cover the medication and is requesting an alternative. Please advise.

## 2022-05-18 NOTE — Telephone Encounter (Signed)
The pt agrees to a trial of pancreaze.  She will call back if there are any concerns over cost.  Prescription has been sent.

## 2022-05-18 NOTE — Telephone Encounter (Signed)
-----   Message from Irving Copas., MD sent at 05/15/2022  5:22 PM EDT -----    ----- Message ----- From: Willia Craze, NP Sent: 05/15/2022   5:13 PM EDT To: Irving Copas., MD

## 2022-05-18 NOTE — Telephone Encounter (Signed)
The pt is not able to afford pancreaze, creon or Zenpep.  Please advise next steps

## 2022-05-26 ENCOUNTER — Encounter: Payer: Self-pay | Admitting: Internal Medicine

## 2022-05-28 ENCOUNTER — Encounter: Payer: Self-pay | Admitting: Internal Medicine

## 2022-05-28 ENCOUNTER — Ambulatory Visit (INDEPENDENT_AMBULATORY_CARE_PROVIDER_SITE_OTHER): Payer: Medicare Other | Admitting: Internal Medicine

## 2022-05-28 VITALS — BP 122/66 | HR 73 | Temp 98.0°F | Resp 20 | Ht 61.0 in | Wt 139.5 lb

## 2022-05-28 DIAGNOSIS — L089 Local infection of the skin and subcutaneous tissue, unspecified: Secondary | ICD-10-CM

## 2022-05-28 MED ORDER — DOXYCYCLINE HYCLATE 100 MG PO TABS: 100.0000 mg | ORAL_TABLET | Freq: Two times a day (BID) | ORAL | 0 refills | Status: DC

## 2022-05-28 NOTE — Progress Notes (Signed)
Subjective:    Patient ID: Adrienne Bennett, female    DOB: [birth year], 61 y.o.   MRN: [id number]  DOS:  05/28/2022 Type of visit - description: Acute, here with her husband  2 days ago noticed some swelling at the left face, the next day swelling was worse and she noted a bruise.  Area is TTP.  Denies fever chills. No sore throat No ear pain No dental pain The area is TTP but the pain does not increase by chewing.  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    GERD (gastroesophageal reflux disease) 05/23/2017   High cholesterol    Hyperlipidemia 10/12/2012   Hypertension    IBS (irritable bowel syndrome)    chronic Diarrhea   IBS ? 07/16/2008   Qualifier: Diagnosis of  By: Larose Kells MD, [place]    Macular degeneration, dry    Migraine    "long long time ago; maybe 20 yr ago" (09/01/2013)   MIGRAINE HEADACHE 03/13/2010   Qualifier: Diagnosis of  By: Larose Kells MD, [place] sores 04/15/2017   Neck pain 02/03/2011   Osteoarthritis    "back; goes down my right leg" (09/01/2013)   Subclinical hyperthyroidism 02/26/2018   Thyromegaly 12/04/2014   Type II diabetes mellitus ([place]) 2007    Past  Surgical History:  Procedure Laterality Date   CARPAL TUNNEL RELEASE Bilateral ~ 2000   cataract surgery Bilateral    TONSILLECTOMY  07/27/1968   TUBAL LIGATION  07/27/1994    Current Outpatient Medications  Medication Instructions   albuterol (VENTOLIN HFA) 108 (90 Base) MCG/ACT inhaler 2 puffs, Inhalation, Every 6 hours PRN   amLODipine (NORVASC) 10 mg, Oral, Daily   apixaban (ELIQUIS) 5 mg, Oral, 2 times daily   atorvastatin (LIPITOR) 40 mg, Oral, Daily at bedtime   augmented betamethasone dipropionate (DIPROLENE-AF) 0.05 % cream Topical, 2 times daily   beclomethasone (QVAR REDIHALER) 40 MCG/ACT inhaler 2 puffs, Inhalation, 2 times daily   clonazePAM (KLONOPIN) 1 mg, Oral, 2 times daily,  rx by psychiatry   dapagliflozin propanediol (FARXIGA) 10 mg, Oral, Daily   diphenoxylate-atropine (LOMOTIL) 2.5-0.025 MG tablet 1 tablet, Oral, 4 times daily PRN   divalproex (DEPAKOTE) 250-500 mg, Oral, See admin instructions, Take one tablet (250 mg) by mouth every morning and  (500 mg) at night   fluticasone furoate-vilanterol (BREO ELLIPTA) 100-25 MCG/ACT AEPB one puff daily.   furosemide (LASIX) 80 mg, Oral, Daily   ipratropium-albuterol (DUONEB) 0.5-2.5 (3) MG/3ML SOLN 3 mLs, Nebulization, 3 times daily PRN   Melatonin 20 mg, Oral, As needed   metFORMIN (GLUCOPHAGE-XR) 1,000 mg, Oral, 2 times daily   metoprolol  tartrate (LOPRESSOR) 50 MG tablet TAKE 1 TABLET BY MOUTH TWICE  DAILY   OXYGEN 2 L/min, Inhalation, Continuous   oxymetazoline (AFRIN) 0.05 % nasal spray 1 spray, Each Nare, 2 times daily PRN   Pancrelipase, Lip-Prot-Amyl, (PANCREAZE) 37000-97300 units CPEP 2 pills with every meal and 1 with every snack   sodium fluoride (PREVIDENT 5000 DRY MOUTH) 1.1 % GEL dental gel 1 Application, dental, 2 times daily   tiZANidine (ZANAFLEX) 2 mg, Oral, 3 times daily       Objective:   Physical Exam HENT:     Head:     BP 122/66   Pulse 73   Temp 98 F (36.7 C) (Oral)   Resp 20   Ht  5\' 1"  (1.549 m)   Wt 139 lb 8 oz (63.3 kg)   LMP 08/24/2015 (Exact Date)   SpO2 91% Comment: 2L/min  BMI 26.36 kg/m  General:   Well developed, NAD, BMI noted. HEENT:  Normocephalic . Face  atraumatic Throat: Symmetric Gums: Normal to palpation without swelling Bruise is noted at the left face, see picture.  Denies injury. Palpation of the face: R parotid normal L parotid normal She does have area of induration about 2 x 1.5 cm in size distal from the parotid, see graphic.  No fluctuance, + tender Lower extremities: no pretibial edema bilaterally  Skin: Not pale. Not jaundice Neurologic:  alert & oriented X3.  Speech normal, gait appropriate for age and unassisted Psych--  Cognition and judgment appear intact.  Cooperative with normal attention span and concentration.  Behavior appropriate. No anxious or depressed appearing.      Assessment      Assessment ENDO: started to see Dr Steffanie Dunn 12/2017 DM  HTN -- dc lisinopril 06-2015>> lips swell x1 , also had stomatitis >> resolved  High cholesterol GI: ---IBS (diarrhea, unable to control BMs) ---Fatty Liver per CT 04-2020 Thyroid disease -Multinodular goiter, thyromegaly w/ dominant nodule: Negative BX 01-2015 - Hyperthyroidism, subclinical, DX 2019 --s/p thyroid ablation 2019 ? IBS.Chronic diarrhea DJD Psychiatry: Sees Dr. 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 Does not have a throat infection, no obvious odontogenic source.  Parotid on the left side is actually normal. She has a bruise but no history of injury. Plan: Start doxycycline (allergic to  penicillin), warm compress, Tylenol, call promptly if not improving.  See AVS.

## 2022-05-28 NOTE — Patient Instructions (Addendum)
Take antibiotic as prescribed  Warm compress  Tylenol as needed  Call if not gradually better  Seek medical attention if fever chills, increased swelling, difficulty swallowing.  Okay to proceed with your flu shot once you are better

## 2022-05-29 NOTE — Assessment & Plan Note (Signed)
Soft tissue infection, left face: Presents with swelling, induration and tenderness to palpation at the left face. Does not have a throat infection, no obvious odontogenic source.  Parotid on the left side is actually normal. She has a bruise but no history of injury. Plan: Start doxycycline (allergic to penicillin), warm compress, Tylenol, call promptly if not improving.  See AVS.

## 2022-05-31 ENCOUNTER — Other Ambulatory Visit: Payer: Self-pay | Admitting: Internal Medicine

## 2022-06-10 ENCOUNTER — Encounter: Payer: Self-pay | Admitting: Cardiology

## 2022-06-10 ENCOUNTER — Ambulatory Visit: Payer: Medicare Other | Attending: Cardiology | Admitting: Cardiology

## 2022-06-10 VITALS — BP 118/52 | HR 60 | Ht 61.0 in | Wt 135.0 lb

## 2022-06-10 DIAGNOSIS — E1122 Type 2 diabetes mellitus with diabetic chronic kidney disease: Secondary | ICD-10-CM

## 2022-06-10 DIAGNOSIS — I480 Paroxysmal atrial fibrillation: Secondary | ICD-10-CM

## 2022-06-10 DIAGNOSIS — J9611 Chronic respiratory failure with hypoxia: Secondary | ICD-10-CM

## 2022-06-10 DIAGNOSIS — I5032 Chronic diastolic (congestive) heart failure: Secondary | ICD-10-CM | POA: Diagnosis not present

## 2022-06-10 DIAGNOSIS — N1831 Chronic kidney disease, stage 3a: Secondary | ICD-10-CM

## 2022-06-10 DIAGNOSIS — F3174 Bipolar disorder, in full remission, most recent episode manic: Secondary | ICD-10-CM

## 2022-06-10 NOTE — Addendum Note (Signed)
Addended by: Roosvelt Harps R on: 06/10/2022 04:02 PM   Modules accepted: Orders
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
## 2022-06-10 NOTE — Patient Instructions (Signed)
Medication Instructions:  Your physician recommends that you continue on your current medications as directed. Please refer to the Current Medication list given to you today.  *If you need a refill on your cardiac medications before your next appointment, please call your pharmacy*   Lab Work: Your physician recommends that you return for lab work in: Today for a BMP & ProBnp  If you have labs (blood work) drawn today and your tests are completely normal, you will receive your results only by: MyChart Message (if you have MyChart) OR A paper copy in the mail If you have any lab test that is abnormal or we need to change your treatment, we will call you to review the results.   Testing/Procedures: NONE   Follow-Up: At [facility], you and your health needs are our priority.  As part of our continuing mission to provide you with exceptional heart care, we have created designated Provider Care Teams.  These Care Teams include your primary Cardiologist (physician) and Advanced Practice Providers (APPs -  Physician Assistants and Nurse Practitioners) who all work together to provide you with the care you need, when you need it.  We recommend signing up for the patient portal called "MyChart".  Sign up information is provided on this After Visit Summary.  MyChart is used to connect with patients for Virtual Visits (Telemedicine).  Patients are able to view lab/test results, encounter notes, upcoming appointments, etc.  Non-urgent messages can be sent to your provider as well.   To learn more about what you can do with MyChart, go to [email or web address].    Your next appointment:   3 month(s)  The format for your next appointment:   In Person  Provider:   [name], MD    Other Instructions   Important Information About Sugar

## 2022-06-11 LAB — BASIC METABOLIC PANEL
BUN/Creatinine Ratio: 12 (ref 12–28)
BUN: 11 mg/dL (ref 8–27)
CO2: 33 mmol/L — ABNORMAL HIGH (ref 20–29)
Calcium: 8.8 mg/dL (ref 8.7–10.3)
Chloride: 97 mmol/L (ref 96–106)
Creatinine, Ser: 0.95 mg/dL (ref 0.57–1.00)
Glucose: 120 mg/dL — ABNORMAL HIGH (ref 70–99)
Potassium: 4 mmol/L (ref 3.5–5.2)
Sodium: 141 mmol/L (ref 134–144)
eGFR: 68 mL/min/{1.73_m2} (ref >59)

## 2022-06-11 LAB — PRO B NATRIURETIC PEPTIDE: NT-Pro BNP: 2106 pg/mL — ABNORMAL HIGH (ref 0–287)

## 2022-06-12 DIAGNOSIS — I10 Essential (primary) hypertension: Secondary | ICD-10-CM | POA: Diagnosis not present

## 2022-06-12 DIAGNOSIS — I5020 Unspecified systolic (congestive) heart failure: Secondary | ICD-10-CM | POA: Diagnosis not present

## 2022-06-12 DIAGNOSIS — J441 Chronic obstructive pulmonary disease with (acute) exacerbation: Secondary | ICD-10-CM | POA: Diagnosis not present

## 2022-06-13 ENCOUNTER — Other Ambulatory Visit: Payer: Self-pay | Admitting: Internal Medicine

## 2022-06-15 ENCOUNTER — Encounter: Payer: Self-pay | Admitting: Internal Medicine

## 2022-06-15 MED ORDER — FLUCONAZOLE 150 MG PO TABS: 150.0000 mg | ORAL_TABLET | Freq: Once | ORAL | 0 refills | Status: AC

## 2022-06-15 NOTE — Telephone Encounter (Signed)
Diflucan 150 mg 1 tablet daily, #2 no refills

## 2022-06-15 NOTE — Telephone Encounter (Signed)
Rx sent 

## 2022-06-15 NOTE — Addendum Note (Signed)
Addended byConrad Chico D on: 06/15/2022 01:11 PM   Modules accepted: Orders

## 2022-06-22 ENCOUNTER — Other Ambulatory Visit: Payer: Self-pay | Admitting: Internal Medicine

## 2022-06-24 NOTE — Telephone Encounter (Signed)
Pt viewed message on My Chart. Routed to PCP.

## 2022-06-25 DIAGNOSIS — R0609 Other forms of dyspnea: Secondary | ICD-10-CM | POA: Diagnosis not present

## 2022-06-25 DIAGNOSIS — I10 Essential (primary) hypertension: Secondary | ICD-10-CM | POA: Diagnosis not present

## 2022-06-25 DIAGNOSIS — E875 Hyperkalemia: Secondary | ICD-10-CM | POA: Diagnosis not present

## 2022-06-26 LAB — PRO B NATRIURETIC PEPTIDE: NT-Pro BNP: 1602 pg/mL — ABNORMAL HIGH (ref 0–287)

## 2022-06-26 LAB — BASIC METABOLIC PANEL
BUN/Creatinine Ratio: 15 (ref 12–28)
BUN: 16 mg/dL (ref 8–27)
CO2: 30 mmol/L — ABNORMAL HIGH (ref 20–29)
Calcium: 8.9 mg/dL (ref 8.7–10.3)
Chloride: 92 mmol/L — ABNORMAL LOW (ref 96–106)
Creatinine, Ser: 1.07 mg/dL — ABNORMAL HIGH (ref 0.57–1.00)
Glucose: 159 mg/dL — ABNORMAL HIGH (ref 70–99)
Potassium: 3.9 mmol/L (ref 3.5–5.2)
Sodium: 141 mmol/L (ref 134–144)
eGFR: 59 mL/min/{1.73_m2} — ABNORMAL LOW (ref >59)

## 2022-06-30 NOTE — Progress Notes (Signed)
[facility] Quality Team Note  Name: Adrienne Bennett Date of Birth: 09/28/1960 MRN: [id number] Date: 06/30/2022  [facility] Quality Team has reviewed this patient's chart, please see recommendations below:  [facility] Quality Other; (KED GAP- KIDNEY HEALTH EVALUATION. PATIENT NEEDS URINE MICROALBUMIN/CREATININE RATIO COMPLETED BEFORE END OF YEAR FOR GAP CLOSURE.)

## 2022-07-07 ENCOUNTER — Emergency Department (HOSPITAL_BASED_OUTPATIENT_CLINIC_OR_DEPARTMENT_OTHER): Payer: Medicare Other

## 2022-07-07 ENCOUNTER — Other Ambulatory Visit: Payer: Self-pay

## 2022-07-07 ENCOUNTER — Encounter (HOSPITAL_BASED_OUTPATIENT_CLINIC_OR_DEPARTMENT_OTHER): Payer: Self-pay | Admitting: Emergency Medicine

## 2022-07-07 ENCOUNTER — Emergency Department (HOSPITAL_BASED_OUTPATIENT_CLINIC_OR_DEPARTMENT_OTHER)
Admission: EM | Admit: 2022-07-07 | Discharge: 2022-07-07 | Disposition: A | Discharge status: Home / Self Care | Payer: Medicare Other | Attending: Emergency Medicine | Admitting: Emergency Medicine

## 2022-07-07 DIAGNOSIS — Z7901 Long term (current) use of anticoagulants: Secondary | ICD-10-CM | POA: Diagnosis not present

## 2022-07-07 DIAGNOSIS — S0083XA Contusion of other part of head, initial encounter: Secondary | ICD-10-CM | POA: Diagnosis not present

## 2022-07-07 DIAGNOSIS — W01198A Fall on same level from slipping, tripping and stumbling with subsequent striking against other object, initial encounter: Secondary | ICD-10-CM | POA: Diagnosis not present

## 2022-07-07 DIAGNOSIS — Z79899 Other long term (current) drug therapy: Secondary | ICD-10-CM | POA: Insufficient documentation

## 2022-07-07 DIAGNOSIS — W19XXXA Unspecified fall, initial encounter: Secondary | ICD-10-CM

## 2022-07-07 DIAGNOSIS — E119 Type 2 diabetes mellitus without complications: Secondary | ICD-10-CM | POA: Insufficient documentation

## 2022-07-07 DIAGNOSIS — D689 Coagulation defect, unspecified: Secondary | ICD-10-CM | POA: Diagnosis not present

## 2022-07-07 DIAGNOSIS — Z7984 Long term (current) use of oral hypoglycemic drugs: Secondary | ICD-10-CM | POA: Diagnosis not present

## 2022-07-07 DIAGNOSIS — M25561 Pain in right knee: Secondary | ICD-10-CM | POA: Diagnosis not present

## 2022-07-07 DIAGNOSIS — T148XXA Other injury of unspecified body region, initial encounter: Secondary | ICD-10-CM

## 2022-07-07 DIAGNOSIS — M25531 Pain in right wrist: Secondary | ICD-10-CM | POA: Diagnosis not present

## 2022-07-07 DIAGNOSIS — S8001XA Contusion of right knee, initial encounter: Secondary | ICD-10-CM | POA: Insufficient documentation

## 2022-07-07 DIAGNOSIS — I110 Hypertensive heart disease with heart failure: Secondary | ICD-10-CM | POA: Insufficient documentation

## 2022-07-07 DIAGNOSIS — I672 Cerebral atherosclerosis: Secondary | ICD-10-CM | POA: Diagnosis not present

## 2022-07-07 DIAGNOSIS — S0993XA Unspecified injury of face, initial encounter: Secondary | ICD-10-CM | POA: Diagnosis present

## 2022-07-07 DIAGNOSIS — I6523 Occlusion and stenosis of bilateral carotid arteries: Secondary | ICD-10-CM | POA: Diagnosis not present

## 2022-07-07 DIAGNOSIS — I509 Heart failure, unspecified: Secondary | ICD-10-CM | POA: Insufficient documentation

## 2022-07-07 DIAGNOSIS — Z043 Encounter for examination and observation following other accident: Secondary | ICD-10-CM | POA: Diagnosis not present

## 2022-07-07 DIAGNOSIS — S80211A Abrasion, right knee, initial encounter: Secondary | ICD-10-CM | POA: Insufficient documentation

## 2022-07-07 DIAGNOSIS — S5001XA Contusion of right elbow, initial encounter: Secondary | ICD-10-CM | POA: Diagnosis not present

## 2022-07-07 DIAGNOSIS — S0990XA Unspecified injury of head, initial encounter: Secondary | ICD-10-CM | POA: Diagnosis not present

## 2022-07-07 MED ORDER — ACETAMINOPHEN 500 MG PO TABS
1000.0000 mg | ORAL_TABLET | Freq: Once | ORAL | Status: AC
  Administered 2022-07-07: 1000 mg via ORAL
  Filled 2022-07-07: qty 2

## 2022-07-07 NOTE — Discharge Instructions (Signed)
All the x-rays today look normal.  Nothing is broken and there is no internal bleeding.  You are going to be very sore over the next few days.  Take Tylenol as needed.  Change the bandage on your knee daily.  You can put some Vaseline on it to prevent any bandage from sticking.

## 2022-07-07 NOTE — ED Provider Notes (Signed)
[facility] EMERGENCY DEPARTMENT Provider Note   CSN: [id number] Arrival date & time: 07/07/22  1008     History  Chief Complaint  Patient presents with   Lytle Michaels    Adrienne Bennett is a 61 y.o. female.  Patient is a 61 year old female with a history of hypertension, diabetes, bipolar disease, CHF, chronic oxygen use who is presenting today due to a fall.  Patient reports that she was going out to the car when her oxygen tubing got wrapped around her leg causing her to fall going face first with her right arm and leg onto the cement.  She did not have any loss of consciousness but did hit her head.  Her husband had to help her up and she was able to ambulate a short distance and upon arrival here but is having significant pain in her right knee.  She denies any neck pain, vision changes, headache nausea or vomiting but she does take Eliquis.  She denies any chest pain, new shortness of breath, abdominal pain or vomiting.  She feels that her breathing is at baseline with her 3 L of oxygen.  She normally does not require any assist devices to move around.  The history is provided by the patient and medical records.  Fall       Home Medications Prior to Admission medications   Medication Sig Start Date End Date Taking? Authorizing Provider  albuterol (VENTOLIN HFA) 108 (90 Base) MCG/ACT inhaler Inhale 2 puffs into the lungs every 6 (six) hours as needed for wheezing or shortness of breath. 02/26/21   Colon Branch, MD  amLODipine (NORVASC) 10 MG tablet Take 1 tablet (10 mg total) by mouth daily. 04/13/22   Colon Branch, MD  apixaban (ELIQUIS) 5 MG TABS tablet Take 1 tablet (5 mg total) by mouth 2 (two) times daily. 06/22/22   Colon Branch, MD  atorvastatin (LIPITOR) 40 MG tablet Take 1 tablet (40 mg total) by mouth at bedtime. 06/01/22   Colon Branch, MD  beclomethasone (QVAR REDIHALER) 40 MCG/ACT inhaler Inhale 2 puffs into the lungs 2 (two) times daily. 05/06/22   Wendling, Crosby Oyster, DO  clonazePAM (KLONOPIN) 1 MG tablet Take 1 mg by mouth 2 (two) times daily.  rx by psychiatry    [provider]  dapagliflozin propanediol (FARXIGA) 10 MG TABS tablet Take 1 tablet (10 mg total) by mouth daily. 02/19/22   Colon Branch, MD  divalproex (DEPAKOTE) 250 MG DR tablet Take 250-500 mg by mouth See admin instructions. Take one tablet (250 mg) by mouth every morning and  (500 mg) at night    [provider]  fluticasone furoate-vilanterol (BREO ELLIPTA) 100-25 MCG/ACT AEPB one puff daily. Patient taking differently: Inhale 1 puff into the lungs daily. one puff daily. 02/19/22   Colon Branch, MD  furosemide (LASIX) 80 MG tablet Take 1 tablet (80 mg total) by mouth daily. 04/01/22   Park Liter, MD  ipratropium-albuterol (DUONEB) 0.5-2.5 (3) MG/3ML SOLN Take 3 mLs by nebulization 3 (three) times daily as needed. Patient taking differently: Take 3 mLs by nebulization 3 (three) times daily as needed (shortness of breath/wheezing). 01/10/22   Antonieta Pert, MD  Melatonin 10 MG TABS Take 20 mg by mouth as needed (sleep).    [provider]  metFORMIN (GLUCOPHAGE-XR) 500 MG 24 hr tablet Take 1,000 mg by mouth 2 (two) times daily. 02/10/22   [provider]  metoprolol tartrate (LOPRESSOR) 50  MG tablet Take 1 tablet (50 mg total) by mouth 2 (two) times daily. 06/15/22   [name], MD  OXYGEN Inhale 2 L/min into the lungs continuous.    [provider]  oxymetazoline (AFRIN) 0.05 % nasal spray Place 1 spray into both nostrils 2 (two) times daily as needed for congestion (nose bleed).    [provider]      Allergies    Benztropine, Lactose intolerance (gi), Lisinopril, Other, Penicillins, Xanax [alprazolam], and Monistat 3 combo pack app  [miconazole nitrate]    Review of Systems   Review of Systems  Physical Exam Updated Vital Signs BP 126/87 (BP Location: Left Arm)   Pulse 65   Temp 97.8 F (36.6 C) (Oral)   Resp (!) 32   Ht  5\' 1"  (1.549 m)   Wt 62.6 kg   LMP 08/24/2015 (Exact Date)   SpO2 100%   BMI 26.07 kg/m  Physical Exam Vitals and nursing note reviewed.  Constitutional:      General: She is not in acute distress.    Appearance: She is well-developed.  HENT:     Head: Normocephalic and atraumatic.     Comments: No notable trauma to the face or tenderness with palpation around the orbit or maxillary area or mandible Eyes:     Extraocular Movements: Extraocular movements intact.     Pupils: Pupils are equal, round, and reactive to light.  Cardiovascular:     Rate and Rhythm: Normal rate and regular rhythm.     Heart sounds: Normal heart sounds. No murmur heard.    No friction rub.  Pulmonary:     Effort: Pulmonary effort is normal.     Breath sounds: Normal breath sounds. No wheezing or rales.  Abdominal:     General: Bowel sounds are normal. There is no distension.     Palpations: Abdomen is soft.     Tenderness: There is no abdominal tenderness. There is no guarding or rebound.  Musculoskeletal:        General: Tenderness and signs of injury present.     Right wrist: Bony tenderness present. Normal range of motion.       Arms:     Cervical back: Normal range of motion and neck supple. No tenderness.     Right hip: Normal.     Right knee: No deformity or lacerations. Decreased range of motion. Tenderness present over the medial joint line, lateral joint line and patellar tendon.       Legs:     Comments: No edema  Skin:    General: Skin is warm and dry.     Findings: No rash.  Neurological:     Mental Status: She is alert and oriented to person, place, and time.     Cranial Nerves: No cranial nerve deficit.     Sensory: No sensory deficit.     Motor: No weakness.  Psychiatric:        Behavior: Behavior normal.     ED Results / Procedures / Treatments   Labs (all labs ordered are listed, but only abnormal results are displayed) Labs Reviewed - No data to  display  EKG None  Radiology DG Knee Complete 4 Views Right  Result Date: 07/07/2022 CLINICAL DATA:  pain after fall EXAM: RIGHT KNEE - COMPLETE 4+ VIEW COMPARISON:  Radiograph 04/18/2019. FINDINGS: There is no evidence of acute fracture. Alignment is normal. There is no significant joint effusion. There is minimal medial and lateral compartment  degenerative change. Unchanged small exostosis along the medial aspect of the medial tibial metaphysis. Vascular calcifications. IMPRESSION: No evidence of acute fracture. Electronically Signed   By: Maurine Simmering M.D.   On: 07/07/2022 11:22   DG Wrist Complete Right  Result Date: 07/07/2022 CLINICAL DATA:  pain after fall EXAM: RIGHT WRIST - COMPLETE 3+ VIEW COMPARISON:  None available FINDINGS: There is a possible nondisplaced fracture at the base of the ulnar styloid/fovea. Mild radiocarpal DRUJ degenerative change. IMPRESSION: Possible nondisplaced fracture at the base of the ulnar styloid/fovea, correlate with point tenderness. Electronically Signed   By: Maurine Simmering M.D.   On: 07/07/2022 11:20   DG Elbow Complete Right  Result Date: 07/07/2022 CLINICAL DATA:  Fall. EXAM: RIGHT ELBOW - COMPLETE 3+ VIEW COMPARISON:  None Available. FINDINGS: There is no evidence of fracture, dislocation, or joint effusion. There is no evidence of arthropathy or other focal bone abnormality. Soft tissues are unremarkable. IMPRESSION: Negative. Electronically Signed   By: Titus Dubin M.D.   On: 07/07/2022 11:16   CT Head Wo Contrast  Result Date: 07/07/2022 CLINICAL DATA:  Head trauma, fell this morning striking RIGHT side of head, coagulopathy EXAM: CT HEAD WITHOUT CONTRAST TECHNIQUE: Contiguous axial images were obtained from the base of the skull through the vertex without intravenous contrast. RADIATION DOSE REDUCTION: This exam was performed according to the departmental dose-optimization program which includes automated exposure control, adjustment of the mA  and/or kV according to patient size and/or use of iterative reconstruction technique. COMPARISON:  12/25/2021 FINDINGS: Brain: Generalized atrophy. Normal ventricular morphology. No midline shift or mass effect. Small vessel chronic ischemic changes of deep cerebral white matter. No intracranial hemorrhage, mass lesion, evidence of acute infarction, or extra-axial fluid collection. Vascular: Minimal atherosclerotic calcification of internal carotid arteries at skull base Skull: Intact Sinuses/Orbits: Clear Other: N/A IMPRESSION: Atrophy with small vessel chronic ischemic changes of deep cerebral white matter. No acute intracranial abnormalities. Electronically Signed   By: Lavonia Dana M.D.   On: 07/07/2022 10:56    Procedures Procedures    Medications Ordered in ED Medications  acetaminophen (TYLENOL) tablet 1,000 mg (1,000 mg Oral Given 07/07/22 1035)    ED Course/ Medical Decision Making/ A&P                           Medical Decision Making Amount and/or Complexity of Data Reviewed Radiology: ordered and independent interpretation performed. Decision-making details documented in ED Course.  Risk OTC drugs.   Pt with multiple medical problems and comorbidities and presenting today with a complaint that caries a high risk for morbidity and mortality.  Here today after a fall when her oxygen tubing got caught in her feet.  She did hit her head and is anticoagulated on Eliquis.  Her biggest complaint is pain in her right knee.  She was able to bear a small amount of weight but is having significant pain with any range of motion.  Ankle and hip on the right are intact.  Also some pain in the right upper forearm and elbow with small contusion.  Patient does not appear to have any rib pain or complaints about respiratory issues.  Breath sounds are clear and oxygen on her home 3 L is 95%.  This appears to be a mechanical fall related to the oxygen tubing getting tangled in her feet and not an  underlying medical issue at this time.  Patient given Tylenol.  Plain films  are pending. 11:44 AM I have independently visualized and interpreted pt's images today.  Head CT negative for acute bleeding, plain films of the elbow, wrist and knee are all negative for fracture.  Radiology reports possible ulnar styloid fracture however patient has no point tenderness, bruising or swelling at this time.  Findings discussed with the patient and her husband.  At this time feel that patient is stable for discharge home.  No indication for further testing or admission at this time.          Final Clinical Impression(s) / ED Diagnoses Final diagnoses:  Fall, initial encounter  Contusion of face, initial encounter  Abrasion  Contusion of right knee, initial encounter  Contusion of right elbow, initial encounter    Rx / DC Orders ED Discharge Orders     None         Blanchie Dessert, MD 07/07/22 1144

## 2022-07-07 NOTE — ED Notes (Signed)
Patient transported to X-ray 

## 2022-07-07 NOTE — ED Triage Notes (Addendum)
Tripped getting into car today and fell hitting her face on concrete and skinned her  rt knee , pt is on eliquis , pt is on home O2 # [place] all the time no loc she reports

## 2022-07-12 DIAGNOSIS — I5020 Unspecified systolic (congestive) heart failure: Secondary | ICD-10-CM | POA: Diagnosis not present

## 2022-07-12 DIAGNOSIS — I10 Essential (primary) hypertension: Secondary | ICD-10-CM | POA: Diagnosis not present

## 2022-07-12 DIAGNOSIS — J441 Chronic obstructive pulmonary disease with (acute) exacerbation: Secondary | ICD-10-CM | POA: Diagnosis not present

## 2022-07-15 ENCOUNTER — Other Ambulatory Visit: Payer: Self-pay | Admitting: Internal Medicine

## 2022-07-15 ENCOUNTER — Ambulatory Visit: Payer: Medicare Other | Admitting: Gastroenterology

## 2022-07-22 DIAGNOSIS — S7011XA Contusion of right thigh, initial encounter: Secondary | ICD-10-CM | POA: Diagnosis not present

## 2022-07-22 DIAGNOSIS — R52 Pain, unspecified: Secondary | ICD-10-CM | POA: Diagnosis not present

## 2022-07-22 DIAGNOSIS — M4726 Other spondylosis with radiculopathy, lumbar region: Secondary | ICD-10-CM | POA: Diagnosis not present

## 2022-08-02 DIAGNOSIS — E876 Hypokalemia: Secondary | ICD-10-CM | POA: Diagnosis not present

## 2022-08-02 DIAGNOSIS — I5031 Acute diastolic (congestive) heart failure: Secondary | ICD-10-CM | POA: Diagnosis not present

## 2022-08-02 DIAGNOSIS — I110 Hypertensive heart disease with heart failure: Secondary | ICD-10-CM | POA: Diagnosis not present

## 2022-08-02 DIAGNOSIS — Z9981 Dependence on supplemental oxygen: Secondary | ICD-10-CM | POA: Diagnosis not present

## 2022-08-02 DIAGNOSIS — M79601 Pain in right arm: Secondary | ICD-10-CM | POA: Diagnosis not present

## 2022-08-02 DIAGNOSIS — R1310 Dysphagia, unspecified: Secondary | ICD-10-CM | POA: Diagnosis not present

## 2022-08-02 DIAGNOSIS — Z043 Encounter for examination and observation following other accident: Secondary | ICD-10-CM | POA: Diagnosis not present

## 2022-08-02 DIAGNOSIS — I480 Paroxysmal atrial fibrillation: Secondary | ICD-10-CM | POA: Diagnosis not present

## 2022-08-02 DIAGNOSIS — Z7901 Long term (current) use of anticoagulants: Secondary | ICD-10-CM | POA: Diagnosis not present

## 2022-08-02 DIAGNOSIS — J9621 Acute and chronic respiratory failure with hypoxia: Secondary | ICD-10-CM | POA: Diagnosis not present

## 2022-08-02 DIAGNOSIS — E872 Acidosis, unspecified: Secondary | ICD-10-CM | POA: Diagnosis not present

## 2022-08-02 DIAGNOSIS — M79621 Pain in right upper arm: Secondary | ICD-10-CM | POA: Diagnosis not present

## 2022-08-02 DIAGNOSIS — I5032 Chronic diastolic (congestive) heart failure: Secondary | ICD-10-CM | POA: Diagnosis not present

## 2022-08-02 DIAGNOSIS — I4891 Unspecified atrial fibrillation: Secondary | ICD-10-CM | POA: Diagnosis not present

## 2022-08-02 DIAGNOSIS — R000 Tachycardia, unspecified: Secondary | ICD-10-CM | POA: Diagnosis not present

## 2022-08-02 DIAGNOSIS — R471 Dysarthria and anarthria: Secondary | ICD-10-CM | POA: Diagnosis not present

## 2022-08-02 DIAGNOSIS — R55 Syncope and collapse: Secondary | ICD-10-CM | POA: Diagnosis not present

## 2022-08-02 DIAGNOSIS — R569 Unspecified convulsions: Secondary | ICD-10-CM | POA: Diagnosis not present

## 2022-08-02 DIAGNOSIS — R2689 Other abnormalities of gait and mobility: Secondary | ICD-10-CM | POA: Diagnosis not present

## 2022-08-02 DIAGNOSIS — J449 Chronic obstructive pulmonary disease, unspecified: Secondary | ICD-10-CM | POA: Diagnosis not present

## 2022-08-02 DIAGNOSIS — A419 Sepsis, unspecified organism: Secondary | ICD-10-CM | POA: Diagnosis not present

## 2022-08-02 DIAGNOSIS — E785 Hyperlipidemia, unspecified: Secondary | ICD-10-CM | POA: Diagnosis not present

## 2022-08-02 DIAGNOSIS — E1165 Type 2 diabetes mellitus with hyperglycemia: Secondary | ICD-10-CM | POA: Diagnosis not present

## 2022-08-02 DIAGNOSIS — Z20822 Contact with and (suspected) exposure to covid-19: Secondary | ICD-10-CM | POA: Diagnosis not present

## 2022-08-02 DIAGNOSIS — I5033 Acute on chronic diastolic (congestive) heart failure: Secondary | ICD-10-CM | POA: Diagnosis not present

## 2022-08-02 DIAGNOSIS — J9601 Acute respiratory failure with hypoxia: Secondary | ICD-10-CM | POA: Diagnosis not present

## 2022-08-02 DIAGNOSIS — R231 Pallor: Secondary | ICD-10-CM | POA: Diagnosis not present

## 2022-08-02 DIAGNOSIS — Z794 Long term (current) use of insulin: Secondary | ICD-10-CM | POA: Diagnosis not present

## 2022-08-02 DIAGNOSIS — G43909 Migraine, unspecified, not intractable, without status migrainosus: Secondary | ICD-10-CM | POA: Diagnosis not present

## 2022-08-02 DIAGNOSIS — R739 Hyperglycemia, unspecified: Secondary | ICD-10-CM | POA: Diagnosis not present

## 2022-08-02 DIAGNOSIS — W19XXXA Unspecified fall, initial encounter: Secondary | ICD-10-CM | POA: Diagnosis not present

## 2022-08-02 DIAGNOSIS — J440 Chronic obstructive pulmonary disease with acute lower respiratory infection: Secondary | ICD-10-CM | POA: Diagnosis not present

## 2022-08-02 DIAGNOSIS — D72829 Elevated white blood cell count, unspecified: Secondary | ICD-10-CM | POA: Diagnosis not present

## 2022-08-02 DIAGNOSIS — R0602 Shortness of breath: Secondary | ICD-10-CM | POA: Diagnosis not present

## 2022-08-02 DIAGNOSIS — R6520 Severe sepsis without septic shock: Secondary | ICD-10-CM | POA: Diagnosis not present

## 2022-08-02 DIAGNOSIS — R296 Repeated falls: Secondary | ICD-10-CM | POA: Diagnosis not present

## 2022-08-02 DIAGNOSIS — I517 Cardiomegaly: Secondary | ICD-10-CM | POA: Diagnosis not present

## 2022-08-03 DIAGNOSIS — I5032 Chronic diastolic (congestive) heart failure: Secondary | ICD-10-CM | POA: Insufficient documentation

## 2022-08-03 DIAGNOSIS — J189 Pneumonia, unspecified organism: Secondary | ICD-10-CM | POA: Insufficient documentation

## 2022-08-03 DIAGNOSIS — R5381 Other malaise: Secondary | ICD-10-CM | POA: Insufficient documentation

## 2022-08-07 DIAGNOSIS — R569 Unspecified convulsions: Secondary | ICD-10-CM | POA: Diagnosis not present

## 2022-08-07 DIAGNOSIS — Z7409 Other reduced mobility: Secondary | ICD-10-CM | POA: Diagnosis not present

## 2022-08-07 DIAGNOSIS — R296 Repeated falls: Secondary | ICD-10-CM | POA: Diagnosis not present

## 2022-08-07 DIAGNOSIS — Z789 Other specified health status: Secondary | ICD-10-CM | POA: Diagnosis not present

## 2022-08-07 DIAGNOSIS — R5381 Other malaise: Secondary | ICD-10-CM | POA: Diagnosis not present

## 2022-08-07 DIAGNOSIS — R2689 Other abnormalities of gait and mobility: Secondary | ICD-10-CM | POA: Diagnosis not present

## 2022-08-08 DIAGNOSIS — J441 Chronic obstructive pulmonary disease with (acute) exacerbation: Secondary | ICD-10-CM | POA: Diagnosis not present

## 2022-08-08 DIAGNOSIS — Z9981 Dependence on supplemental oxygen: Secondary | ICD-10-CM | POA: Diagnosis not present

## 2022-08-08 DIAGNOSIS — I5030 Unspecified diastolic (congestive) heart failure: Secondary | ICD-10-CM | POA: Diagnosis not present

## 2022-08-08 DIAGNOSIS — Z7901 Long term (current) use of anticoagulants: Secondary | ICD-10-CM | POA: Diagnosis not present

## 2022-08-08 DIAGNOSIS — I110 Hypertensive heart disease with heart failure: Secondary | ICD-10-CM | POA: Diagnosis not present

## 2022-08-11 DIAGNOSIS — Z7409 Other reduced mobility: Secondary | ICD-10-CM | POA: Diagnosis not present

## 2022-08-11 DIAGNOSIS — R2689 Other abnormalities of gait and mobility: Secondary | ICD-10-CM | POA: Diagnosis not present

## 2022-08-11 DIAGNOSIS — R5381 Other malaise: Secondary | ICD-10-CM | POA: Diagnosis not present

## 2022-08-11 DIAGNOSIS — R296 Repeated falls: Secondary | ICD-10-CM | POA: Diagnosis not present

## 2022-08-11 DIAGNOSIS — R569 Unspecified convulsions: Secondary | ICD-10-CM | POA: Diagnosis not present

## 2022-08-11 DIAGNOSIS — Z789 Other specified health status: Secondary | ICD-10-CM | POA: Diagnosis not present

## 2022-08-12 DIAGNOSIS — I10 Essential (primary) hypertension: Secondary | ICD-10-CM | POA: Diagnosis not present

## 2022-08-12 DIAGNOSIS — I5020 Unspecified systolic (congestive) heart failure: Secondary | ICD-10-CM | POA: Diagnosis not present

## 2022-08-12 DIAGNOSIS — J441 Chronic obstructive pulmonary disease with (acute) exacerbation: Secondary | ICD-10-CM | POA: Diagnosis not present

## 2022-08-13 DIAGNOSIS — Z789 Other specified health status: Secondary | ICD-10-CM | POA: Diagnosis not present

## 2022-08-13 DIAGNOSIS — Z7409 Other reduced mobility: Secondary | ICD-10-CM | POA: Diagnosis not present

## 2022-08-13 DIAGNOSIS — R296 Repeated falls: Secondary | ICD-10-CM | POA: Diagnosis not present

## 2022-08-13 DIAGNOSIS — R5381 Other malaise: Secondary | ICD-10-CM | POA: Diagnosis not present

## 2022-08-13 DIAGNOSIS — R2689 Other abnormalities of gait and mobility: Secondary | ICD-10-CM | POA: Diagnosis not present

## 2022-08-13 DIAGNOSIS — R569 Unspecified convulsions: Secondary | ICD-10-CM | POA: Diagnosis not present

## 2022-08-14 DIAGNOSIS — R2689 Other abnormalities of gait and mobility: Secondary | ICD-10-CM | POA: Diagnosis not present

## 2022-08-14 DIAGNOSIS — R296 Repeated falls: Secondary | ICD-10-CM | POA: Diagnosis not present

## 2022-08-14 DIAGNOSIS — R569 Unspecified convulsions: Secondary | ICD-10-CM | POA: Diagnosis not present

## 2022-08-14 DIAGNOSIS — Z789 Other specified health status: Secondary | ICD-10-CM | POA: Diagnosis not present

## 2022-08-14 DIAGNOSIS — Z7409 Other reduced mobility: Secondary | ICD-10-CM | POA: Diagnosis not present

## 2022-08-14 DIAGNOSIS — R5381 Other malaise: Secondary | ICD-10-CM | POA: Diagnosis not present

## 2022-08-17 DIAGNOSIS — R2689 Other abnormalities of gait and mobility: Secondary | ICD-10-CM | POA: Diagnosis not present

## 2022-08-17 DIAGNOSIS — R5381 Other malaise: Secondary | ICD-10-CM | POA: Diagnosis not present

## 2022-08-17 DIAGNOSIS — R569 Unspecified convulsions: Secondary | ICD-10-CM | POA: Diagnosis not present

## 2022-08-17 DIAGNOSIS — R296 Repeated falls: Secondary | ICD-10-CM | POA: Diagnosis not present

## 2022-08-17 DIAGNOSIS — Z789 Other specified health status: Secondary | ICD-10-CM | POA: Diagnosis not present

## 2022-08-17 DIAGNOSIS — Z7409 Other reduced mobility: Secondary | ICD-10-CM | POA: Diagnosis not present

## 2022-08-18 DIAGNOSIS — Z794 Long term (current) use of insulin: Secondary | ICD-10-CM | POA: Diagnosis not present

## 2022-08-18 DIAGNOSIS — E042 Nontoxic multinodular goiter: Secondary | ICD-10-CM | POA: Diagnosis not present

## 2022-08-18 DIAGNOSIS — E113293 Type 2 diabetes mellitus with mild nonproliferative diabetic retinopathy without macular edema, bilateral: Secondary | ICD-10-CM | POA: Diagnosis not present

## 2022-08-18 LAB — HEMOGLOBIN A1C: Hemoglobin A1C: 7.5

## 2022-08-19 DIAGNOSIS — R2689 Other abnormalities of gait and mobility: Secondary | ICD-10-CM | POA: Diagnosis not present

## 2022-08-19 DIAGNOSIS — R5381 Other malaise: Secondary | ICD-10-CM | POA: Diagnosis not present

## 2022-08-19 DIAGNOSIS — R569 Unspecified convulsions: Secondary | ICD-10-CM | POA: Diagnosis not present

## 2022-08-19 DIAGNOSIS — Z7409 Other reduced mobility: Secondary | ICD-10-CM | POA: Diagnosis not present

## 2022-08-19 DIAGNOSIS — Z789 Other specified health status: Secondary | ICD-10-CM | POA: Diagnosis not present

## 2022-08-19 DIAGNOSIS — R296 Repeated falls: Secondary | ICD-10-CM | POA: Diagnosis not present

## 2022-08-21 DIAGNOSIS — Z789 Other specified health status: Secondary | ICD-10-CM | POA: Diagnosis not present

## 2022-08-21 DIAGNOSIS — Z7409 Other reduced mobility: Secondary | ICD-10-CM | POA: Diagnosis not present

## 2022-08-21 DIAGNOSIS — R296 Repeated falls: Secondary | ICD-10-CM | POA: Diagnosis not present

## 2022-08-21 DIAGNOSIS — R5381 Other malaise: Secondary | ICD-10-CM | POA: Diagnosis not present

## 2022-08-21 DIAGNOSIS — R569 Unspecified convulsions: Secondary | ICD-10-CM | POA: Diagnosis not present

## 2022-08-21 DIAGNOSIS — R2689 Other abnormalities of gait and mobility: Secondary | ICD-10-CM | POA: Diagnosis not present

## 2022-08-24 DIAGNOSIS — Z789 Other specified health status: Secondary | ICD-10-CM | POA: Diagnosis not present

## 2022-08-24 DIAGNOSIS — Z7409 Other reduced mobility: Secondary | ICD-10-CM | POA: Diagnosis not present

## 2022-08-24 DIAGNOSIS — R296 Repeated falls: Secondary | ICD-10-CM | POA: Diagnosis not present

## 2022-08-24 DIAGNOSIS — R569 Unspecified convulsions: Secondary | ICD-10-CM | POA: Diagnosis not present

## 2022-08-24 DIAGNOSIS — R2689 Other abnormalities of gait and mobility: Secondary | ICD-10-CM | POA: Diagnosis not present

## 2022-08-24 DIAGNOSIS — R5381 Other malaise: Secondary | ICD-10-CM | POA: Diagnosis not present

## 2022-08-26 DIAGNOSIS — R2689 Other abnormalities of gait and mobility: Secondary | ICD-10-CM | POA: Diagnosis not present

## 2022-08-26 DIAGNOSIS — Z7409 Other reduced mobility: Secondary | ICD-10-CM | POA: Diagnosis not present

## 2022-08-26 DIAGNOSIS — R296 Repeated falls: Secondary | ICD-10-CM | POA: Diagnosis not present

## 2022-08-26 DIAGNOSIS — R5381 Other malaise: Secondary | ICD-10-CM | POA: Diagnosis not present

## 2022-08-26 DIAGNOSIS — R569 Unspecified convulsions: Secondary | ICD-10-CM | POA: Diagnosis not present

## 2022-08-26 DIAGNOSIS — Z789 Other specified health status: Secondary | ICD-10-CM | POA: Diagnosis not present

## 2022-08-28 ENCOUNTER — Telehealth: Payer: Self-pay | Admitting: Internal Medicine

## 2022-08-28 DIAGNOSIS — R296 Repeated falls: Secondary | ICD-10-CM

## 2022-08-28 DIAGNOSIS — R269 Unspecified abnormalities of gait and mobility: Secondary | ICD-10-CM

## 2022-08-28 DIAGNOSIS — R2689 Other abnormalities of gait and mobility: Secondary | ICD-10-CM | POA: Diagnosis not present

## 2022-08-28 DIAGNOSIS — Z789 Other specified health status: Secondary | ICD-10-CM | POA: Diagnosis not present

## 2022-08-28 NOTE — Telephone Encounter (Signed)
Caller/Agency: [facility] - seen there for outpatient PT Callback Number: [phone] Requesting OT/PT/Skilled Nursing/Social Work/Speech Therapy: OT Frequency:  Need a referral for OT sent for the patient Fax: [phone]

## 2022-08-31 NOTE — Telephone Encounter (Signed)
Referral placed.

## 2022-09-01 ENCOUNTER — Telehealth: Payer: Self-pay

## 2022-09-01 NOTE — Telephone Encounter (Signed)
OT referral form received from [facility] in [facility], form signed and faxed back to [phone]. Form sent for scanning.

## 2022-09-02 DIAGNOSIS — Z789 Other specified health status: Secondary | ICD-10-CM | POA: Diagnosis not present

## 2022-09-02 DIAGNOSIS — R2689 Other abnormalities of gait and mobility: Secondary | ICD-10-CM | POA: Diagnosis not present

## 2022-09-03 ENCOUNTER — Encounter: Payer: Self-pay | Admitting: Internal Medicine

## 2022-09-04 DIAGNOSIS — Z789 Other specified health status: Secondary | ICD-10-CM | POA: Diagnosis not present

## 2022-09-04 DIAGNOSIS — R2689 Other abnormalities of gait and mobility: Secondary | ICD-10-CM | POA: Diagnosis not present

## 2022-09-07 DIAGNOSIS — R2689 Other abnormalities of gait and mobility: Secondary | ICD-10-CM | POA: Diagnosis not present

## 2022-09-07 DIAGNOSIS — Z789 Other specified health status: Secondary | ICD-10-CM | POA: Diagnosis not present

## 2022-09-09 DIAGNOSIS — R2689 Other abnormalities of gait and mobility: Secondary | ICD-10-CM | POA: Diagnosis not present

## 2022-09-09 DIAGNOSIS — Z789 Other specified health status: Secondary | ICD-10-CM | POA: Diagnosis not present

## 2022-09-11 ENCOUNTER — Encounter: Payer: Self-pay | Admitting: Internal Medicine

## 2022-09-12 DIAGNOSIS — J441 Chronic obstructive pulmonary disease with (acute) exacerbation: Secondary | ICD-10-CM | POA: Diagnosis not present

## 2022-09-12 DIAGNOSIS — I10 Essential (primary) hypertension: Secondary | ICD-10-CM | POA: Diagnosis not present

## 2022-09-12 DIAGNOSIS — I5020 Unspecified systolic (congestive) heart failure: Secondary | ICD-10-CM | POA: Diagnosis not present

## 2022-09-14 ENCOUNTER — Ambulatory Visit (INDEPENDENT_AMBULATORY_CARE_PROVIDER_SITE_OTHER): Payer: Medicare Other | Admitting: Internal Medicine

## 2022-09-14 ENCOUNTER — Ambulatory Visit (HOSPITAL_BASED_OUTPATIENT_CLINIC_OR_DEPARTMENT_OTHER)
Admission: RE | Admit: 2022-09-14 | Discharge: 2022-09-14 | Disposition: A | Discharge status: Home / Self Care | Payer: Medicare Other | Source: Ambulatory Visit | Attending: Internal Medicine | Admitting: Internal Medicine

## 2022-09-14 ENCOUNTER — Encounter: Payer: Self-pay | Admitting: Internal Medicine

## 2022-09-14 VITALS — BP 138/72 | HR 72 | Temp 98.0°F | Resp 18 | Ht 61.0 in | Wt 138.0 lb

## 2022-09-14 DIAGNOSIS — E875 Hyperkalemia: Secondary | ICD-10-CM

## 2022-09-14 DIAGNOSIS — Z23 Encounter for immunization: Secondary | ICD-10-CM | POA: Diagnosis not present

## 2022-09-14 DIAGNOSIS — E1122 Type 2 diabetes mellitus with diabetic chronic kidney disease: Secondary | ICD-10-CM

## 2022-09-14 DIAGNOSIS — N1831 Chronic kidney disease, stage 3a: Secondary | ICD-10-CM

## 2022-09-14 DIAGNOSIS — I5032 Chronic diastolic (congestive) heart failure: Secondary | ICD-10-CM | POA: Diagnosis not present

## 2022-09-14 DIAGNOSIS — J189 Pneumonia, unspecified organism: Secondary | ICD-10-CM | POA: Diagnosis not present

## 2022-09-14 DIAGNOSIS — D649 Anemia, unspecified: Secondary | ICD-10-CM | POA: Diagnosis not present

## 2022-09-14 DIAGNOSIS — R61 Generalized hyperhidrosis: Secondary | ICD-10-CM | POA: Insufficient documentation

## 2022-09-14 DIAGNOSIS — R2689 Other abnormalities of gait and mobility: Secondary | ICD-10-CM | POA: Diagnosis not present

## 2022-09-14 DIAGNOSIS — Z789 Other specified health status: Secondary | ICD-10-CM | POA: Diagnosis not present

## 2022-09-14 LAB — CBC WITH DIFFERENTIAL/PLATELET
Basophils Absolute: 0.1 10*3/uL (ref 0.0–0.1)
Basophils Relative: 0.7 % (ref 0.0–3.0)
Eosinophils Absolute: 0.1 10*3/uL (ref 0.0–0.7)
Eosinophils Relative: 1.8 % (ref 0.0–5.0)
HCT: 30.3 % — ABNORMAL LOW (ref 36.0–46.0)
Hemoglobin: 9.6 g/dL — ABNORMAL LOW (ref 12.0–15.0)
Lymphocytes Relative: 19.6 % (ref 12.0–46.0)
Lymphs Abs: 1.5 10*3/uL (ref 0.7–4.0)
MCHC: 31.8 g/dL (ref 30.0–36.0)
MCV: 90.9 fl (ref 78.0–100.0)
Monocytes Absolute: 0.6 10*3/uL (ref 0.1–1.0)
Monocytes Relative: 7.6 % (ref 3.0–12.0)
Neutro Abs: 5.5 10*3/uL (ref 1.4–7.7)
Neutrophils Relative %: 70.3 % (ref 43.0–77.0)
Platelets: 240 10*3/uL (ref 150.0–400.0)
RBC: 3.34 Mil/uL — ABNORMAL LOW (ref 3.87–5.11)
RDW: 16.2 % — ABNORMAL HIGH (ref 11.5–15.5)
WBC: 7.9 10*3/uL (ref 4.0–10.5)

## 2022-09-14 LAB — IRON: Iron: 67 ug/dL (ref 42–145)

## 2022-09-14 LAB — BASIC METABOLIC PANEL
BUN: 45 mg/dL — ABNORMAL HIGH (ref 6–23)
CO2: 35 mEq/L — ABNORMAL HIGH (ref 19–32)
Calcium: 9.2 mg/dL (ref 8.4–10.5)
Chloride: 89 mEq/L — ABNORMAL LOW (ref 96–112)
Creatinine, Ser: 1.46 mg/dL — ABNORMAL HIGH (ref 0.40–1.20)
GFR: 38.34 mL/min — ABNORMAL LOW (ref >60.00)
Glucose, Bld: 355 mg/dL — ABNORMAL HIGH (ref 70–99)
Potassium: 5.4 mEq/L — ABNORMAL HIGH (ref 3.5–5.1)
Sodium: 133 mEq/L — ABNORMAL LOW (ref 135–145)

## 2022-09-14 LAB — FERRITIN: Ferritin: 32.8 ng/mL (ref 10.0–291.0)

## 2022-09-14 MED ORDER — TRIAMCINOLONE ACETONIDE 0.1 % EX LOTN: 1.0000 | TOPICAL_LOTION | Freq: Three times a day (TID) | CUTANEOUS | 0 refills | Status: DC

## 2022-09-14 NOTE — Patient Instructions (Addendum)
You can get RSV vaccine at your pharmacy  I sent a lotion to be applied to your abdomen to 3 times a day as needed  Check the  blood pressure regularly BP GOAL is between 110/65 and  135/85. If it is consistently higher or lower, let me know      GO TO THE LAB : Get the blood work     [place], [place] back for physical exam in 2 or 3 months  Per our records you are due for your diabetic eye exam. Please contact your eye doctor to schedule an appointment. Please have them send copies of your office visit notes to [place]. Our fax number is [phone]. If you need a referral to an eye doctor please let us know.

## 2022-09-14 NOTE — Progress Notes (Unsigned)
Subjective:    Patient ID: Adrienne Bennett, female    DOB: [birth year], 62 y.o.   MRN: [id number]  DOS:  09/14/2022 Type of visit - description: f/u  Extensive chart review.  ER visit 07/07/2022, had a fall, CT head no acute, x-rays possible right wrist fracture.  Admitted to hospital and discharge 08/06/2022: Admitted with seizure-like activity. Mental status changes. Acute on chronic hypoxic failure. Bacterial pneumonia CHF exacerbation Brain MRI was negative for acute findings, UDS and UA were negative.  Mental status changes felt to be due to sepsis. Was treated with antibiotics, IV Lasix, oxygen needs went back to baseline, was recommended SNF but patient refused it. Prior to discharge she was euvolemic.   Since she left the hospital he is at home. No fever or chills No further confusion No chest pain SOB at baseline as low as he uses oxygen. Still has diarrhea, a chronic issue. Also for the last few weeks has noted night sweats, not associated chills or subjective fevers. Also has a rash on the abdomen, very itchy, is resolving.   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     GERD (gastroesophageal reflux disease) 05/23/2017   High cholesterol    Hyperlipidemia 10/12/2012   Hypertension    IBS (irritable bowel syndrome)    chronic Diarrhea   IBS ? 07/16/2008   Qualifier: Diagnosis of  By: Larose Kells MD, [place]    Macular degeneration, dry    Migraine    "long long time ago; maybe 20 yr ago" (09/01/2013)   MIGRAINE HEADACHE 03/13/2010   Qualifier: Diagnosis of  By: Larose Kells MD, [place] sores 04/15/2017   Neck pain 02/03/2011   Osteoarthritis    "back; goes down my right leg" (09/01/2013)   Subclinical hyperthyroidism 02/26/2018   Thyromegaly 12/04/2014   Type II diabetes mellitus ([place]) 2007    Past Surgical History:  Procedure Laterality Date   CARPAL TUNNEL RELEASE Bilateral ~ 2000   cataract surgery Bilateral    TONSILLECTOMY  07/27/1968   TUBAL LIGATION  07/27/1994    Current Outpatient Medications  Medication Instructions   albuterol (VENTOLIN HFA) 108 (90 Base) MCG/ACT inhaler 2 puffs, Inhalation, Every 6 hours PRN   amLODipine (NORVASC) 10 mg, Oral, Daily   apixaban (ELIQUIS) 5 mg, Oral, 2 times daily   atorvastatin (LIPITOR) 40 mg, Oral, Daily at bedtime   beclomethasone (QVAR REDIHALER) 40 MCG/ACT inhaler 2 puffs, Inhalation, 2 times daily   clonazePAM (KLONOPIN) 1 mg, Oral, 2 times daily,  rx by psychiatry   dapagliflozin propanediol (FARXIGA) 10 mg, Oral, Daily   divalproex (DEPAKOTE) 250-500 mg, Oral, See admin  instructions, Take one tablet (250 mg) by mouth every morning and  (500 mg) at night   fluticasone furoate-vilanterol (BREO ELLIPTA) 100-25 MCG/ACT AEPB one puff daily.   furosemide (LASIX) 80 mg, Oral, Daily   glimepiride (AMARYL) 2 MG tablet 1 tablet, Oral, Daily before breakfast   ipratropium-albuterol (DUONEB) 0.5-2.5 (3) MG/3ML SOLN 3 mLs, Nebulization, 3 times daily PRN   Melatonin 20 mg, Oral, As needed   metFORMIN (GLUCOPHAGE-XR) 1,000 mg, 2 times daily   metoprolol tartrate (LOPRESSOR) 50 mg, Oral, 2 times daily   OXYGEN  2 L/min, Inhalation, Continuous   oxymetazoline (AFRIN) 0.05 % nasal spray 1 spray, 2 times daily PRN       Objective:   Physical Exam BP 138/72   Pulse 72   Temp 98 F (36.7 C) (Oral)   Resp 18   Ht 5' 1"$  (1.549 m)   Wt 138 lb (62.6 kg)   LMP 08/24/2015 (Exact Date)   SpO2 96% Comment: 3L/min  BMI 26.07 kg/m  General:   Well developed, NAD, BMI noted. HEENT:  Normocephalic . Face symmetric, atraumatic Lungs:  Decreased breath sounds but clear Normal respiratory effort, no intercostal retractions, no accessory muscle use. Heart: Seems regular, soft systolic murmur.  Lower extremities: no pretibial edema bilaterally  Skin: Several, spread , 1 or 2 mm round skin lesions slightly scaly.  Slightly red. Neurologic:  alert & oriented X3.  Speech normal, gait appropriate for age, assisted by a cane Psych--  Cognition and judgment appear intact.  Cooperative with normal attention span and concentration.  Behavior appropriate. No anxious or depressed appearing.      Assessment     Assessment ENDO: started to see Dr Steffanie Dunn 12/2017 DM  HTN -- dc lisinopril 06-2015>> lips swell x1 , also had stomatitis >> resolved  High cholesterol GI: ---IBS (diarrhea, unable to control BMs) ---Fatty Liver per CT 04-2020 Thyroid disease -Multinodular goiter, thyromegaly w/ dominant nodule: Negative BX 01-2015 - Hyperthyroidism, subclinical, DX 2019 --s/p thyroid ablation 2019 ? IBS.Chronic diarrhea DJD Psychiatry: Sees Dr. 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   Platelets and WBC is normal.  Admitted  January 2024: MS changes, acute on chronic respiratory failure, bacterial pneumonia, CHF exacerbation. Seem to be back to baseline.  Anemia noted, has chronic diarrhea but no blood in the stool. Plan: Chest x-ray CBC iron ferritin BMP  Diarrhea: Ongoing issue, already eval by GI.no blood in the stools  DM: Per Endo.  Not on insulin on metformin, on glimepiride. CHF, seems euvolemic. Paroxysmal A-fib: Anticoagulated without apparent problems. Preventive care: Inquired about RSV, recommend to proceed.  PNM 20 today, flu shot ---- RTC 3 months  11-2- Soft tissue infection, left face: Presents with swelling, induration and tenderness to palpation at the left face. Does not have a throat infection, no obvious odontogenic source.  Parotid on the left side is actually normal. She has a bruise but no history of injury. Plan: Start doxycycline (allergic to penicillin), warm compress, Tylenol, call promptly

## 2022-09-15 LAB — MICROALBUMIN / CREATININE URINE RATIO
Creatinine,U: 32.9 mg/dL
Microalb Creat Ratio: 16.8 mg/g (ref 0.0–30.0)
Microalb, Ur: 5.5 mg/dL — ABNORMAL HIGH (ref 0.0–1.9)

## 2022-09-15 NOTE — Addendum Note (Signed)
Addended byDamita Dunnings D on: 09/15/2022 10:53 AM   Modules accepted: Orders

## 2022-09-15 NOTE — Assessment & Plan Note (Signed)
Recent tests from care everywhere: January 2023: A1c 7.5, creatinine 1.05, potassium 4.3, CBC with a hemoglobin of 8.4.  Platelets and WBC is normal. Admitted  January 2024: MS changes, acute on chronic respiratory failure, bacterial pneumonia, CHF exacerbation. Seem to be back to baseline.  Anemia noted, has chronic diarrhea but no blood in the stool. Plan: Chest x-ray CBC iron ferritin BMP Diarrhea: Ongoing issue, already eval by GI.no blood in the stools  DM: Per Endo.  Not on insulin or metformin, on glimepiride. CHF, seems euvolemic. Paroxysmal A-fib: Anticoagulated without apparent problems. Preventive care: Inquired about RSV, recommend to proceed.  PNM 20-flu shot today. RTC 3 months

## 2022-09-16 DIAGNOSIS — R2689 Other abnormalities of gait and mobility: Secondary | ICD-10-CM | POA: Diagnosis not present

## 2022-09-16 DIAGNOSIS — Z789 Other specified health status: Secondary | ICD-10-CM | POA: Diagnosis not present

## 2022-09-18 ENCOUNTER — Other Ambulatory Visit (INDEPENDENT_AMBULATORY_CARE_PROVIDER_SITE_OTHER): Payer: Medicare Other

## 2022-09-18 DIAGNOSIS — E875 Hyperkalemia: Secondary | ICD-10-CM

## 2022-09-18 LAB — BASIC METABOLIC PANEL
BUN: 38 mg/dL — ABNORMAL HIGH (ref 6–23)
CO2: 34 mEq/L — ABNORMAL HIGH (ref 19–32)
Calcium: 9 mg/dL (ref 8.4–10.5)
Chloride: 91 mEq/L — ABNORMAL LOW (ref 96–112)
Creatinine, Ser: 1.21 mg/dL — ABNORMAL HIGH (ref 0.40–1.20)
GFR: 48.03 mL/min — ABNORMAL LOW (ref >60.00)
Glucose, Bld: 323 mg/dL — ABNORMAL HIGH (ref 70–99)
Potassium: 4.4 mEq/L (ref 3.5–5.1)
Sodium: 136 mEq/L (ref 135–145)

## 2022-09-21 ENCOUNTER — Telehealth: Payer: Self-pay | Admitting: *Deleted

## 2022-09-21 DIAGNOSIS — R2689 Other abnormalities of gait and mobility: Secondary | ICD-10-CM | POA: Diagnosis not present

## 2022-09-21 DIAGNOSIS — Z789 Other specified health status: Secondary | ICD-10-CM | POA: Diagnosis not present

## 2022-09-21 NOTE — Telephone Encounter (Signed)
Adrienne Bennett, [facility] 09/18/2022  2:34 PM EST Back to Top    [facility] informing of results and recommendations. Instructed her to let us know if she is returning fluid or BP is elevated.   Colon Branch, MD 09/18/2022  2:23 PM EST     Please call patient. Kidney function improving, potassium looks good. We recently decreased Lasix, if BP is okay and she is not retaining fluid okay to stay on half tablet of Lasix.  Otherwise let me know.

## 2022-09-21 NOTE — Telephone Encounter (Signed)
Pt notified of results and notes below.

## 2022-09-23 DIAGNOSIS — R2689 Other abnormalities of gait and mobility: Secondary | ICD-10-CM | POA: Diagnosis not present

## 2022-09-23 DIAGNOSIS — Z789 Other specified health status: Secondary | ICD-10-CM | POA: Diagnosis not present

## 2022-09-25 DIAGNOSIS — R296 Repeated falls: Secondary | ICD-10-CM | POA: Diagnosis not present

## 2022-09-25 DIAGNOSIS — R269 Unspecified abnormalities of gait and mobility: Secondary | ICD-10-CM | POA: Diagnosis not present

## 2022-09-25 DIAGNOSIS — Z7409 Other reduced mobility: Secondary | ICD-10-CM | POA: Diagnosis not present

## 2022-09-25 DIAGNOSIS — Z789 Other specified health status: Secondary | ICD-10-CM | POA: Diagnosis not present

## 2022-09-28 DIAGNOSIS — R278 Other lack of coordination: Secondary | ICD-10-CM | POA: Diagnosis not present

## 2022-09-28 DIAGNOSIS — Z789 Other specified health status: Secondary | ICD-10-CM | POA: Diagnosis not present

## 2022-09-28 DIAGNOSIS — I5032 Chronic diastolic (congestive) heart failure: Secondary | ICD-10-CM | POA: Diagnosis not present

## 2022-09-28 DIAGNOSIS — R569 Unspecified convulsions: Secondary | ICD-10-CM | POA: Diagnosis not present

## 2022-09-28 DIAGNOSIS — R2689 Other abnormalities of gait and mobility: Secondary | ICD-10-CM | POA: Diagnosis not present

## 2022-09-28 DIAGNOSIS — R5381 Other malaise: Secondary | ICD-10-CM | POA: Diagnosis not present

## 2022-09-28 DIAGNOSIS — J449 Chronic obstructive pulmonary disease, unspecified: Secondary | ICD-10-CM | POA: Diagnosis not present

## 2022-09-28 DIAGNOSIS — R296 Repeated falls: Secondary | ICD-10-CM | POA: Diagnosis not present

## 2022-09-28 DIAGNOSIS — E1165 Type 2 diabetes mellitus with hyperglycemia: Secondary | ICD-10-CM | POA: Diagnosis not present

## 2022-09-28 DIAGNOSIS — Z7409 Other reduced mobility: Secondary | ICD-10-CM | POA: Diagnosis not present

## 2022-09-28 DIAGNOSIS — Z794 Long term (current) use of insulin: Secondary | ICD-10-CM | POA: Diagnosis not present

## 2022-09-30 ENCOUNTER — Ambulatory Visit: Payer: Medicare Other | Attending: Cardiology | Admitting: Cardiology

## 2022-09-30 ENCOUNTER — Encounter: Payer: Self-pay | Admitting: Cardiology

## 2022-09-30 VITALS — BP 158/70 | HR 72 | Ht 61.0 in | Wt 138.1 lb

## 2022-09-30 DIAGNOSIS — K2100 Gastro-esophageal reflux disease with esophagitis, without bleeding: Secondary | ICD-10-CM | POA: Diagnosis not present

## 2022-09-30 DIAGNOSIS — N1831 Chronic kidney disease, stage 3a: Secondary | ICD-10-CM

## 2022-09-30 DIAGNOSIS — R278 Other lack of coordination: Secondary | ICD-10-CM | POA: Diagnosis not present

## 2022-09-30 DIAGNOSIS — I5032 Chronic diastolic (congestive) heart failure: Secondary | ICD-10-CM | POA: Diagnosis not present

## 2022-09-30 DIAGNOSIS — Z789 Other specified health status: Secondary | ICD-10-CM | POA: Diagnosis not present

## 2022-09-30 DIAGNOSIS — R5381 Other malaise: Secondary | ICD-10-CM | POA: Diagnosis not present

## 2022-09-30 DIAGNOSIS — R2689 Other abnormalities of gait and mobility: Secondary | ICD-10-CM | POA: Diagnosis not present

## 2022-09-30 DIAGNOSIS — Z794 Long term (current) use of insulin: Secondary | ICD-10-CM | POA: Diagnosis not present

## 2022-09-30 DIAGNOSIS — Z7409 Other reduced mobility: Secondary | ICD-10-CM | POA: Diagnosis not present

## 2022-09-30 DIAGNOSIS — E1122 Type 2 diabetes mellitus with diabetic chronic kidney disease: Secondary | ICD-10-CM

## 2022-09-30 DIAGNOSIS — J449 Chronic obstructive pulmonary disease, unspecified: Secondary | ICD-10-CM | POA: Diagnosis not present

## 2022-09-30 DIAGNOSIS — E1165 Type 2 diabetes mellitus with hyperglycemia: Secondary | ICD-10-CM | POA: Diagnosis not present

## 2022-09-30 DIAGNOSIS — I480 Paroxysmal atrial fibrillation: Secondary | ICD-10-CM

## 2022-09-30 DIAGNOSIS — R296 Repeated falls: Secondary | ICD-10-CM | POA: Diagnosis not present

## 2022-09-30 DIAGNOSIS — R569 Unspecified convulsions: Secondary | ICD-10-CM | POA: Diagnosis not present
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
## 2022-09-30 NOTE — Patient Instructions (Signed)

## 2022-10-04 ENCOUNTER — Other Ambulatory Visit: Payer: Self-pay | Admitting: Internal Medicine

## 2022-10-05 ENCOUNTER — Telehealth: Payer: Self-pay

## 2022-10-05 NOTE — Telephone Encounter (Signed)
PT plan of care received from [facility], signed and faxed back to [phone]. Form sent for scanning.

## 2022-10-08 DIAGNOSIS — R197 Diarrhea, unspecified: Secondary | ICD-10-CM | POA: Diagnosis not present

## 2022-10-08 DIAGNOSIS — K8689 Other specified diseases of pancreas: Secondary | ICD-10-CM | POA: Diagnosis not present

## 2022-10-11 DIAGNOSIS — J441 Chronic obstructive pulmonary disease with (acute) exacerbation: Secondary | ICD-10-CM | POA: Diagnosis not present

## 2022-10-11 DIAGNOSIS — I5020 Unspecified systolic (congestive) heart failure: Secondary | ICD-10-CM | POA: Diagnosis not present

## 2022-10-11 DIAGNOSIS — I10 Essential (primary) hypertension: Secondary | ICD-10-CM | POA: Diagnosis not present

## 2022-10-15 DIAGNOSIS — Z7409 Other reduced mobility: Secondary | ICD-10-CM | POA: Diagnosis not present

## 2022-10-15 DIAGNOSIS — E1165 Type 2 diabetes mellitus with hyperglycemia: Secondary | ICD-10-CM | POA: Diagnosis not present

## 2022-10-15 DIAGNOSIS — R296 Repeated falls: Secondary | ICD-10-CM | POA: Diagnosis not present

## 2022-10-15 DIAGNOSIS — J449 Chronic obstructive pulmonary disease, unspecified: Secondary | ICD-10-CM | POA: Diagnosis not present

## 2022-10-15 DIAGNOSIS — R569 Unspecified convulsions: Secondary | ICD-10-CM | POA: Diagnosis not present

## 2022-10-15 DIAGNOSIS — R5381 Other malaise: Secondary | ICD-10-CM | POA: Diagnosis not present

## 2022-10-15 DIAGNOSIS — R2689 Other abnormalities of gait and mobility: Secondary | ICD-10-CM | POA: Diagnosis not present

## 2022-10-15 DIAGNOSIS — I5032 Chronic diastolic (congestive) heart failure: Secondary | ICD-10-CM | POA: Diagnosis not present

## 2022-10-15 DIAGNOSIS — Z794 Long term (current) use of insulin: Secondary | ICD-10-CM | POA: Diagnosis not present

## 2022-10-15 DIAGNOSIS — R278 Other lack of coordination: Secondary | ICD-10-CM | POA: Diagnosis not present

## 2022-10-15 DIAGNOSIS — Z789 Other specified health status: Secondary | ICD-10-CM | POA: Diagnosis not present

## 2022-10-22 DIAGNOSIS — Z7409 Other reduced mobility: Secondary | ICD-10-CM | POA: Diagnosis not present

## 2022-10-22 DIAGNOSIS — E1165 Type 2 diabetes mellitus with hyperglycemia: Secondary | ICD-10-CM | POA: Diagnosis not present

## 2022-10-22 DIAGNOSIS — R569 Unspecified convulsions: Secondary | ICD-10-CM | POA: Diagnosis not present

## 2022-10-22 DIAGNOSIS — R296 Repeated falls: Secondary | ICD-10-CM | POA: Diagnosis not present

## 2022-10-22 DIAGNOSIS — R5381 Other malaise: Secondary | ICD-10-CM | POA: Diagnosis not present

## 2022-10-22 DIAGNOSIS — Z789 Other specified health status: Secondary | ICD-10-CM | POA: Diagnosis not present

## 2022-10-22 DIAGNOSIS — I5032 Chronic diastolic (congestive) heart failure: Secondary | ICD-10-CM | POA: Diagnosis not present

## 2022-10-22 DIAGNOSIS — R2689 Other abnormalities of gait and mobility: Secondary | ICD-10-CM | POA: Diagnosis not present

## 2022-10-22 DIAGNOSIS — J449 Chronic obstructive pulmonary disease, unspecified: Secondary | ICD-10-CM | POA: Diagnosis not present

## 2022-10-22 DIAGNOSIS — Z794 Long term (current) use of insulin: Secondary | ICD-10-CM | POA: Diagnosis not present

## 2022-10-22 DIAGNOSIS — R278 Other lack of coordination: Secondary | ICD-10-CM | POA: Diagnosis not present

## 2022-10-25 ENCOUNTER — Encounter: Payer: Self-pay | Admitting: Cardiology

## 2022-10-26 ENCOUNTER — Telehealth: Payer: Self-pay

## 2022-10-26 NOTE — Telephone Encounter (Signed)
   Patient Name: Adrienne Bennett  DOB: 04-Dec-1960 MRN: [id number]  Primary Cardiologist: Jenne Campus, MD  Chart reviewed as part of pre-operative protocol coverage.   Guidance for holding Eliquis should come from PCP.  Thanks,   Mable Fill, Marissa Nestle, NP 10/26/2022, 8:01 AM

## 2022-10-26 NOTE — Telephone Encounter (Signed)
   Pre-operative Risk Assessment    Patient Name: Adrienne Bennett  DOB: 29-Jan-1961 MRN: [id number]      Request for Surgical Clearance    Procedure:   EGD and coloscopy  Date of Surgery:  Clearance TBD                                 Surgeon:  Margaretmary Bayley, MD Surgeon's Group or Practice Name:  [facility] Phone number:  [phone]  Fax number:  [phone]    Type of Clearance Requested:   - Pharmacy:  Hold Apixaban (Eliquis) 2 days   Type of Anesthesia:  Not Indicated   Additional requests/questions:    Abelino Derrick   10/26/2022, 7:34 AM

## 2022-10-28 ENCOUNTER — Telehealth: Payer: Self-pay

## 2022-10-28 NOTE — Telephone Encounter (Signed)
Stable atrial fibrillation, last EKG shows sinus rhythm. Plan: Okay to hold anticoagulation as requested and restart as soon as GI recommend.

## 2022-10-28 NOTE — Telephone Encounter (Signed)
Pt needing cscope and egd w/ [facility]. She has not been scheduled yet. Procedure clearance form received from [facility]. They are requesting for her Eliquis to be placed on hold 2 days prior to procedure. Please advise.

## 2022-10-29 NOTE — Telephone Encounter (Signed)
Form completed and faxed back to [facility] at [phone]. Form sent for scanning.

## 2022-10-29 NOTE — Telephone Encounter (Signed)
Received fax confirmation

## 2022-11-03 ENCOUNTER — Encounter: Payer: Self-pay | Admitting: Internal Medicine

## 2022-11-11 DIAGNOSIS — I5020 Unspecified systolic (congestive) heart failure: Secondary | ICD-10-CM | POA: Diagnosis not present

## 2022-11-11 DIAGNOSIS — I10 Essential (primary) hypertension: Secondary | ICD-10-CM | POA: Diagnosis not present

## 2022-11-11 DIAGNOSIS — J441 Chronic obstructive pulmonary disease with (acute) exacerbation: Secondary | ICD-10-CM | POA: Diagnosis not present

## 2022-11-12 DIAGNOSIS — R2689 Other abnormalities of gait and mobility: Secondary | ICD-10-CM | POA: Diagnosis not present

## 2022-11-12 DIAGNOSIS — R269 Unspecified abnormalities of gait and mobility: Secondary | ICD-10-CM | POA: Diagnosis not present

## 2022-11-12 DIAGNOSIS — R569 Unspecified convulsions: Secondary | ICD-10-CM | POA: Diagnosis not present

## 2022-11-12 DIAGNOSIS — I5032 Chronic diastolic (congestive) heart failure: Secondary | ICD-10-CM | POA: Diagnosis not present

## 2022-11-12 DIAGNOSIS — Z794 Long term (current) use of insulin: Secondary | ICD-10-CM | POA: Diagnosis not present

## 2022-11-12 DIAGNOSIS — R296 Repeated falls: Secondary | ICD-10-CM | POA: Diagnosis not present

## 2022-11-12 DIAGNOSIS — J449 Chronic obstructive pulmonary disease, unspecified: Secondary | ICD-10-CM | POA: Diagnosis not present

## 2022-11-12 DIAGNOSIS — Z7409 Other reduced mobility: Secondary | ICD-10-CM | POA: Diagnosis not present

## 2022-11-12 DIAGNOSIS — Z789 Other specified health status: Secondary | ICD-10-CM | POA: Diagnosis not present

## 2022-11-12 DIAGNOSIS — E1165 Type 2 diabetes mellitus with hyperglycemia: Secondary | ICD-10-CM | POA: Diagnosis not present

## 2022-11-12 DIAGNOSIS — R5381 Other malaise: Secondary | ICD-10-CM | POA: Diagnosis not present

## 2022-11-12 DIAGNOSIS — R278 Other lack of coordination: Secondary | ICD-10-CM | POA: Diagnosis not present

## 2022-11-17 ENCOUNTER — Telehealth: Payer: Self-pay

## 2022-11-17 NOTE — Telephone Encounter (Signed)
Plan of care signed and faxed back to [facility] at [phone]. Form sent for scanning.

## 2022-11-18 DIAGNOSIS — E042 Nontoxic multinodular goiter: Secondary | ICD-10-CM | POA: Diagnosis not present

## 2022-11-18 DIAGNOSIS — E113293 Type 2 diabetes mellitus with mild nonproliferative diabetic retinopathy without macular edema, bilateral: Secondary | ICD-10-CM | POA: Diagnosis not present

## 2022-11-18 DIAGNOSIS — Z794 Long term (current) use of insulin: Secondary | ICD-10-CM | POA: Diagnosis not present

## 2022-11-18 LAB — COMPREHENSIVE METABOLIC PANEL
Albumin: 4.1 (ref 3.5–5.0)
Calcium: 9 (ref 8.7–10.7)
Globulin: 3.7
eGFR: 48

## 2022-11-18 LAB — BASIC METABOLIC PANEL
BUN: 29 — AB (ref 4–21)
CO2: 23 — AB (ref 13–22)
Chloride: 96 — AB (ref 99–108)
Creatinine: 1.3 — AB (ref 0.5–1.1)
Glucose: 307
Potassium: 3.9 mEq/L (ref 3.5–5.1)
Sodium: 138 (ref 137–147)

## 2022-11-18 LAB — LIPID PANEL
Cholesterol: 137 (ref 0–200)
HDL: 39 (ref 35–70)
LDL Cholesterol: 64
Triglycerides: 209 — AB (ref 40–160)

## 2022-11-18 LAB — HEPATIC FUNCTION PANEL
ALT: 32 U/L (ref 7–35)
AST: 24 (ref 13–35)
Alkaline Phosphatase: 142 — AB (ref 25–125)
Bilirubin, Total: 0.2

## 2022-11-18 LAB — PROTEIN / CREATININE RATIO, URINE
Albumin, U: 110.6
Creatinine, Urine: 32.7

## 2022-11-18 LAB — HEMOGLOBIN A1C: Hemoglobin A1C: 8.1

## 2022-11-18 LAB — TSH: TSH: 1.93 (ref 0.41–5.90)

## 2022-11-19 DIAGNOSIS — R5381 Other malaise: Secondary | ICD-10-CM | POA: Diagnosis not present

## 2022-11-19 DIAGNOSIS — I5032 Chronic diastolic (congestive) heart failure: Secondary | ICD-10-CM | POA: Diagnosis not present

## 2022-11-19 DIAGNOSIS — R278 Other lack of coordination: Secondary | ICD-10-CM | POA: Diagnosis not present

## 2022-11-19 DIAGNOSIS — Z789 Other specified health status: Secondary | ICD-10-CM | POA: Diagnosis not present

## 2022-11-19 DIAGNOSIS — R269 Unspecified abnormalities of gait and mobility: Secondary | ICD-10-CM | POA: Diagnosis not present

## 2022-11-19 DIAGNOSIS — Z7409 Other reduced mobility: Secondary | ICD-10-CM | POA: Diagnosis not present

## 2022-11-19 DIAGNOSIS — R2689 Other abnormalities of gait and mobility: Secondary | ICD-10-CM | POA: Diagnosis not present

## 2022-11-19 DIAGNOSIS — R296 Repeated falls: Secondary | ICD-10-CM | POA: Diagnosis not present

## 2022-11-19 DIAGNOSIS — E1165 Type 2 diabetes mellitus with hyperglycemia: Secondary | ICD-10-CM | POA: Diagnosis not present

## 2022-11-19 DIAGNOSIS — R569 Unspecified convulsions: Secondary | ICD-10-CM | POA: Diagnosis not present

## 2022-11-19 DIAGNOSIS — Z794 Long term (current) use of insulin: Secondary | ICD-10-CM | POA: Diagnosis not present

## 2022-11-19 DIAGNOSIS — J449 Chronic obstructive pulmonary disease, unspecified: Secondary | ICD-10-CM | POA: Diagnosis not present

## 2022-11-20 NOTE — Telephone Encounter (Signed)
Received again- orders signed and faxed to [phone]. Form sent for scanning.   Received fax confirmation.

## 2022-11-25 DIAGNOSIS — Z789 Other specified health status: Secondary | ICD-10-CM | POA: Diagnosis not present

## 2022-11-25 DIAGNOSIS — Z794 Long term (current) use of insulin: Secondary | ICD-10-CM | POA: Diagnosis not present

## 2022-11-25 DIAGNOSIS — R5381 Other malaise: Secondary | ICD-10-CM | POA: Diagnosis not present

## 2022-11-25 DIAGNOSIS — R269 Unspecified abnormalities of gait and mobility: Secondary | ICD-10-CM | POA: Diagnosis not present

## 2022-11-25 DIAGNOSIS — R296 Repeated falls: Secondary | ICD-10-CM | POA: Diagnosis not present

## 2022-11-25 DIAGNOSIS — I5032 Chronic diastolic (congestive) heart failure: Secondary | ICD-10-CM | POA: Diagnosis not present

## 2022-11-25 DIAGNOSIS — E1165 Type 2 diabetes mellitus with hyperglycemia: Secondary | ICD-10-CM | POA: Diagnosis not present

## 2022-11-25 DIAGNOSIS — J449 Chronic obstructive pulmonary disease, unspecified: Secondary | ICD-10-CM | POA: Diagnosis not present

## 2022-11-25 DIAGNOSIS — R278 Other lack of coordination: Secondary | ICD-10-CM | POA: Diagnosis not present

## 2022-11-25 DIAGNOSIS — R2689 Other abnormalities of gait and mobility: Secondary | ICD-10-CM | POA: Diagnosis not present

## 2022-11-25 DIAGNOSIS — Z7409 Other reduced mobility: Secondary | ICD-10-CM | POA: Diagnosis not present

## 2022-11-25 DIAGNOSIS — R569 Unspecified convulsions: Secondary | ICD-10-CM | POA: Diagnosis not present

## 2022-11-30 ENCOUNTER — Other Ambulatory Visit: Payer: Self-pay | Admitting: Internal Medicine

## 2022-12-01 ENCOUNTER — Other Ambulatory Visit: Payer: Self-pay | Admitting: Internal Medicine

## 2022-12-02 DIAGNOSIS — R2689 Other abnormalities of gait and mobility: Secondary | ICD-10-CM | POA: Diagnosis not present

## 2022-12-02 DIAGNOSIS — R5381 Other malaise: Secondary | ICD-10-CM | POA: Diagnosis not present

## 2022-12-02 DIAGNOSIS — J449 Chronic obstructive pulmonary disease, unspecified: Secondary | ICD-10-CM | POA: Diagnosis not present

## 2022-12-02 DIAGNOSIS — Z794 Long term (current) use of insulin: Secondary | ICD-10-CM | POA: Diagnosis not present

## 2022-12-02 DIAGNOSIS — Z789 Other specified health status: Secondary | ICD-10-CM | POA: Diagnosis not present

## 2022-12-02 DIAGNOSIS — I5032 Chronic diastolic (congestive) heart failure: Secondary | ICD-10-CM | POA: Diagnosis not present

## 2022-12-02 DIAGNOSIS — R296 Repeated falls: Secondary | ICD-10-CM | POA: Diagnosis not present

## 2022-12-02 DIAGNOSIS — R269 Unspecified abnormalities of gait and mobility: Secondary | ICD-10-CM | POA: Diagnosis not present

## 2022-12-02 DIAGNOSIS — R569 Unspecified convulsions: Secondary | ICD-10-CM | POA: Diagnosis not present

## 2022-12-02 DIAGNOSIS — Z7409 Other reduced mobility: Secondary | ICD-10-CM | POA: Diagnosis not present

## 2022-12-02 DIAGNOSIS — E1165 Type 2 diabetes mellitus with hyperglycemia: Secondary | ICD-10-CM | POA: Diagnosis not present

## 2022-12-02 DIAGNOSIS — R278 Other lack of coordination: Secondary | ICD-10-CM | POA: Diagnosis not present

## 2022-12-09 DIAGNOSIS — R5381 Other malaise: Secondary | ICD-10-CM | POA: Diagnosis not present

## 2022-12-09 DIAGNOSIS — R278 Other lack of coordination: Secondary | ICD-10-CM | POA: Diagnosis not present

## 2022-12-09 DIAGNOSIS — R569 Unspecified convulsions: Secondary | ICD-10-CM | POA: Diagnosis not present

## 2022-12-09 DIAGNOSIS — R269 Unspecified abnormalities of gait and mobility: Secondary | ICD-10-CM | POA: Diagnosis not present

## 2022-12-09 DIAGNOSIS — Z7409 Other reduced mobility: Secondary | ICD-10-CM | POA: Diagnosis not present

## 2022-12-09 DIAGNOSIS — J449 Chronic obstructive pulmonary disease, unspecified: Secondary | ICD-10-CM | POA: Diagnosis not present

## 2022-12-09 DIAGNOSIS — Z794 Long term (current) use of insulin: Secondary | ICD-10-CM | POA: Diagnosis not present

## 2022-12-09 DIAGNOSIS — R296 Repeated falls: Secondary | ICD-10-CM | POA: Diagnosis not present

## 2022-12-09 DIAGNOSIS — E1165 Type 2 diabetes mellitus with hyperglycemia: Secondary | ICD-10-CM | POA: Diagnosis not present

## 2022-12-09 DIAGNOSIS — Z789 Other specified health status: Secondary | ICD-10-CM | POA: Diagnosis not present

## 2022-12-09 DIAGNOSIS — I5032 Chronic diastolic (congestive) heart failure: Secondary | ICD-10-CM | POA: Diagnosis not present

## 2022-12-09 DIAGNOSIS — R2689 Other abnormalities of gait and mobility: Secondary | ICD-10-CM | POA: Diagnosis not present

## 2022-12-11 ENCOUNTER — Other Ambulatory Visit: Payer: Self-pay | Admitting: Internal Medicine

## 2022-12-11 ENCOUNTER — Encounter: Payer: Self-pay | Admitting: Internal Medicine

## 2022-12-11 DIAGNOSIS — I10 Essential (primary) hypertension: Secondary | ICD-10-CM | POA: Diagnosis not present

## 2022-12-11 DIAGNOSIS — J441 Chronic obstructive pulmonary disease with (acute) exacerbation: Secondary | ICD-10-CM | POA: Diagnosis not present

## 2022-12-11 DIAGNOSIS — I5020 Unspecified systolic (congestive) heart failure: Secondary | ICD-10-CM | POA: Diagnosis not present

## 2022-12-14 ENCOUNTER — Ambulatory Visit (INDEPENDENT_AMBULATORY_CARE_PROVIDER_SITE_OTHER): Payer: Medicare Other | Admitting: Internal Medicine

## 2022-12-14 ENCOUNTER — Other Ambulatory Visit (HOSPITAL_BASED_OUTPATIENT_CLINIC_OR_DEPARTMENT_OTHER): Payer: Self-pay

## 2022-12-14 ENCOUNTER — Encounter: Payer: Self-pay | Admitting: Internal Medicine

## 2022-12-14 VITALS — BP 126/64 | HR 93 | Temp 98.1°F | Resp 16 | Ht 61.0 in | Wt 138.4 lb

## 2022-12-14 DIAGNOSIS — I5032 Chronic diastolic (congestive) heart failure: Secondary | ICD-10-CM | POA: Diagnosis not present

## 2022-12-14 DIAGNOSIS — I480 Paroxysmal atrial fibrillation: Secondary | ICD-10-CM | POA: Diagnosis not present

## 2022-12-14 DIAGNOSIS — I10 Essential (primary) hypertension: Secondary | ICD-10-CM | POA: Diagnosis not present

## 2022-12-14 MED ORDER — COVID-19 MRNA VAC-TRIS(PFIZER) 30 MCG/0.3ML IM SUSY
0.3000 mL | PREFILLED_SYRINGE | Freq: Once | INTRAMUSCULAR | 0 refills | Status: AC
  Filled 2022-12-14: qty 0.3, 1d supply, fill #0

## 2022-12-14 NOTE — Patient Instructions (Addendum)
Tylenol  500 mg OTC 2 tabs a day every 8 hours as needed for pain  Avoid anti-inflammatory such as ibuprofen, naproxen.  Okay to put Voltaren gel to the shoulder.  2-3 times a day.  Vaccines I recommend: Tdap (tetanus) Covid booster if not done by 03-2022     GO TO THE FRONT DESK, PLEASE SCHEDULE YOUR APPOINTMENTS Come back for a well exam in 3 months    Per our records you are due for your diabetic eye exam. Please contact your eye doctor to schedule an appointment. Please have them send copies of your office visit notes to [place]. Our fax number is [phone]. If you need a referral to an eye doctor please let us know.

## 2022-12-14 NOTE — Progress Notes (Signed)
Subjective:    Patient ID: Adrienne Bennett, female    DOB: 1960-11-25, 62 y.o.   MRN: [id number]  DOS:  12/14/2022 Type of visit - description: Follow-up, here with her husband.  Upon arrival noted to be hypoxemic, recheck O2 sat: Improved. Not using oxygen, able to exercise without problem.  2 months ago, was at the gym and doing some lifting, started to have pain at the left shoulder.   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    Essential hypertension 09/04/2010   Qualifier: Diagnosis of  By: [name] MD, [name].    GANGLION CYST 07/22/2010   Qualifier: Diagnosis of  By: [name] MD, Gordon Vandunk E.    GERD (gastroesophageal reflux disease) 05/23/2017   High cholesterol    Hyperlipidemia 10/12/2012   Hypertension    IBS (irritable bowel syndrome)    chronic Diarrhea   IBS ? 07/16/2008   Qualifier: Diagnosis of  By: [name] MD, [name].    Macular degeneration, dry    Migraine    "long long time ago; maybe 20 yr ago" (09/01/2013)   MIGRAINE HEADACHE 03/13/2010   Qualifier: Diagnosis of  By: [name] MD, Tajay Muzzy E.    Mouth sores 04/15/2017   Neck pain 02/03/2011   Osteoarthritis    "back; goes down my right leg" (09/01/2013)   Subclinical hyperthyroidism 02/26/2018   Thyromegaly 12/04/2014   Type II diabetes mellitus (HCC) 2007    Past Surgical History:  Procedure  Laterality Date   CARPAL TUNNEL RELEASE Bilateral ~ 2000   cataract surgery Bilateral    TONSILLECTOMY  07/27/1968   TUBAL LIGATION  07/27/1994    Current Outpatient Medications  Medication Instructions   albuterol (VENTOLIN HFA) 108 (90 Base) MCG/ACT inhaler 2 puffs, Inhalation, Every 6 hours PRN   amLODipine (NORVASC) 10 mg, Oral, Daily   apixaban (ELIQUIS) 5 mg, Oral, 2 times daily   atorvastatin (LIPITOR) 40 mg, Oral, Daily at bedtime   BREO ELLIPTA 100-25 MCG/ACT AEPB 1 puff, Inhalation, Daily   clonazePAM (KLONOPIN) 1 mg, Oral, 2 times daily,  rx by psychiatry   dapagliflozin propanediol (FARXIGA) 10 mg, Oral, Daily   divalproex (DEPAKOTE) 250-500 mg, Oral, See admin instructions, Take one tablet (250 mg) by mouth every morning and  (500 mg) at night   furosemide (LASIX) 40 mg, Oral, Daily   glimepiride (AMARYL) 2 MG tablet 1 tablet, Oral, Daily before breakfast   ipratropium-albuterol (DUONEB) 0.5-2.5 (3) MG/3ML SOLN 3 mLs, Nebulization, 3 times daily PRN   metoprolol tartrate (LOPRESSOR) 50 mg, Oral, 2 times daily   OXYGEN 2 L/min, Continuous   oxymetazoline (AFRIN) 0.05 % nasal spray 1 spray, Each Nare, 2 times daily PRN   primidone (MYSOLINE) 50 mg, Oral, 2 times daily   triamcinolone lotion (KENALOG) 0.1 % 1 Application, Topical, 3 times daily  Objective:   Physical Exam BP 126/64   Pulse 93   Temp 98.1 F (36.7 C) (Oral)   Resp 16   Ht 5\' 1"  (1.549 m)   Wt 138 lb 6 oz (62.8 kg)   LMP 08/24/2015 (Exact Date)   SpO2 91%   BMI 26.15 kg/m  General:   Well developed, NAD, BMI noted. HEENT:  Normocephalic . Face symmetric, atraumatic Lungs:  Breath sounds. Normal respiratory effort, no intercostal retractions, no accessory muscle use. Heart: RRR,  no murmur.  Shoulders: R normal L: Slightly TTP at the [facility] joint anteriorly.  ROM slightly decreased when she reaches back. Biceps normal bilaterally. Skin: Not pale. Not jaundice Neurologic:  alert &  oriented X3.  Speech normal, gait appropriate for age and unassisted Psych--  Cognition and judgment appear intact.  Cooperative with normal attention span and concentration.  Behavior appropriate. No anxious or depressed appearing.      Assessment      Assessment ENDO: started to see Dr Morrison Old 12/2017 DM  HTN -- dc lisinopril 06-2015>> lips swell x1 , also had stomatitis >> resolved  High cholesterol GI: ---IBS (diarrhea, unable to control BMs) ---Fatty Liver per CT 04-2020 Thyroid disease -Multinodular goiter, thyromegaly w/ dominant nodule: Negative BX 01-2015 - Hyperthyroidism, subclinical, DX 2019 --s/p thyroid ablation 2019 ? IBS.Chronic diarrhea DJD Psychiatry: Sees Dr. 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 A1c 8.1 Paroxysmal A-fib, heart failure: Saw cardiology 09/30/2022, stable, no changes made. HTN: BP today is pretty good, continue amlodipine, Lasix. Creatinine elevated on and off, last renal imaging 01-2022 for acute kidney injury was negative for obstruction. Recommend good hydration, avoid NSAIDs. High cholesterol: LDL 64, continue atorvastatin. Respiratory failure: O2 sat 91% upon arrival, rechecked after resting 96%.  She has not been using oxygen, O2 sat at home around 95%, able to exercise, goes to the gym. She is quite reluctant to continue with oxygen, advised to check when she exercised to be sure is not dropping under 91 or 93%.  If that is the case she can cancel the oxygen (I will okay the order) Shoulder pain, L:  Declines sports medicine referral, recommend Tylenol, Voltaren gel. COVID booster recommended. RTC 3 months CPX  =============    Recent tests from care everywhere: January 2023: A1c 7.5, creatinine 1.05, potassium 4.3, CBC with a hemoglobin of 8.4.  Platelets and WBC is normal. Admitted  January 2024: MS changes, acute on chronic respiratory failure, bacterial pneumonia, CHF exacerbation. Seem to be back to baseline.  Anemia noted, has chronic diarrhea but no blood in the stool. Plan: Chest x-ray CBC iron ferritin BMP Diarrhea: Ongoing issue, already eval by GI.no blood in the stools  DM: Per Endo.  Not on insulin or metformin, on glimepiride. CHF, seems euvolemic. Paroxysmal A-fib: Anticoagulated without apparent problems. Preventive care: Inquired about RSV, recommend to proceed.  PNM 20-flu shot today. RTC 3 months

## 2022-12-15 NOTE — Assessment & Plan Note (Signed)
Recent labs care everywhere April 2024: Total cholesterol 137, LDL 64 Creatinine 1.26, alkaline phosphatase 142, all other LFTs normal. A1c 8.1 Paroxysmal A-fib, heart failure: Saw cardiology 09/30/2022, stable, no changes made. HTN: BP today is pretty good, continue amlodipine, Lasix. Creatinine elevated on and off, last renal imaging 01-2022 for acute kidney injury was negative for obstruction. Recommend good hydration, avoid NSAIDs. High cholesterol: LDL 64, continue atorvastatin. Respiratory failure: O2 sat 91% upon arrival, rechecked after resting 96%.  She has not been using oxygen, O2 sat at home around 95%, able to exercise, goes to the gym. She is quite reluctant to continue with oxygen, advised to check when she exercised to be sure is not dropping under 90s%.  If that is the case she can cancel the oxygen (I will okay the order) Shoulder pain, L: Declines sports medicine referral, recommend Tylenol, Voltaren gel. COVID booster recommended. RTC 3 months CPX

## 2022-12-16 DIAGNOSIS — E1165 Type 2 diabetes mellitus with hyperglycemia: Secondary | ICD-10-CM | POA: Diagnosis not present

## 2022-12-16 DIAGNOSIS — I5032 Chronic diastolic (congestive) heart failure: Secondary | ICD-10-CM | POA: Diagnosis not present

## 2022-12-16 DIAGNOSIS — R296 Repeated falls: Secondary | ICD-10-CM | POA: Diagnosis not present

## 2022-12-16 DIAGNOSIS — Z789 Other specified health status: Secondary | ICD-10-CM | POA: Diagnosis not present

## 2022-12-16 DIAGNOSIS — R5381 Other malaise: Secondary | ICD-10-CM | POA: Diagnosis not present

## 2022-12-16 DIAGNOSIS — R269 Unspecified abnormalities of gait and mobility: Secondary | ICD-10-CM | POA: Diagnosis not present

## 2022-12-16 DIAGNOSIS — Z794 Long term (current) use of insulin: Secondary | ICD-10-CM | POA: Diagnosis not present

## 2022-12-16 DIAGNOSIS — R569 Unspecified convulsions: Secondary | ICD-10-CM | POA: Diagnosis not present

## 2022-12-16 DIAGNOSIS — R278 Other lack of coordination: Secondary | ICD-10-CM | POA: Diagnosis not present

## 2022-12-16 DIAGNOSIS — J449 Chronic obstructive pulmonary disease, unspecified: Secondary | ICD-10-CM | POA: Diagnosis not present

## 2022-12-16 DIAGNOSIS — R2689 Other abnormalities of gait and mobility: Secondary | ICD-10-CM | POA: Diagnosis not present

## 2022-12-16 DIAGNOSIS — Z7409 Other reduced mobility: Secondary | ICD-10-CM | POA: Diagnosis not present

## 2022-12-17 DIAGNOSIS — Z79899 Other long term (current) drug therapy: Secondary | ICD-10-CM | POA: Diagnosis not present

## 2022-12-17 DIAGNOSIS — J449 Chronic obstructive pulmonary disease, unspecified: Secondary | ICD-10-CM | POA: Diagnosis not present

## 2022-12-17 DIAGNOSIS — K648 Other hemorrhoids: Secondary | ICD-10-CM | POA: Diagnosis not present

## 2022-12-17 DIAGNOSIS — I509 Heart failure, unspecified: Secondary | ICD-10-CM | POA: Diagnosis not present

## 2022-12-17 DIAGNOSIS — Z794 Long term (current) use of insulin: Secondary | ICD-10-CM | POA: Diagnosis not present

## 2022-12-17 DIAGNOSIS — R197 Diarrhea, unspecified: Secondary | ICD-10-CM | POA: Diagnosis not present

## 2022-12-17 DIAGNOSIS — D122 Benign neoplasm of ascending colon: Secondary | ICD-10-CM | POA: Diagnosis not present

## 2022-12-17 DIAGNOSIS — K5140 Inflammatory polyps of colon without complications: Secondary | ICD-10-CM | POA: Diagnosis not present

## 2022-12-17 DIAGNOSIS — I110 Hypertensive heart disease with heart failure: Secondary | ICD-10-CM | POA: Diagnosis not present

## 2022-12-17 DIAGNOSIS — K8689 Other specified diseases of pancreas: Secondary | ICD-10-CM | POA: Diagnosis not present

## 2022-12-17 DIAGNOSIS — I480 Paroxysmal atrial fibrillation: Secondary | ICD-10-CM | POA: Diagnosis not present

## 2022-12-17 DIAGNOSIS — Z7901 Long term (current) use of anticoagulants: Secondary | ICD-10-CM | POA: Diagnosis not present

## 2022-12-17 DIAGNOSIS — E1165 Type 2 diabetes mellitus with hyperglycemia: Secondary | ICD-10-CM | POA: Diagnosis not present

## 2022-12-17 LAB — HM COLONOSCOPY

## 2022-12-18 DIAGNOSIS — Z01810 Encounter for preprocedural cardiovascular examination: Secondary | ICD-10-CM | POA: Diagnosis not present

## 2022-12-23 DIAGNOSIS — I5032 Chronic diastolic (congestive) heart failure: Secondary | ICD-10-CM | POA: Diagnosis not present

## 2022-12-23 DIAGNOSIS — R269 Unspecified abnormalities of gait and mobility: Secondary | ICD-10-CM | POA: Diagnosis not present

## 2022-12-23 DIAGNOSIS — Z7409 Other reduced mobility: Secondary | ICD-10-CM | POA: Diagnosis not present

## 2022-12-23 DIAGNOSIS — Z789 Other specified health status: Secondary | ICD-10-CM | POA: Diagnosis not present

## 2022-12-23 DIAGNOSIS — R296 Repeated falls: Secondary | ICD-10-CM | POA: Diagnosis not present

## 2022-12-23 DIAGNOSIS — Z794 Long term (current) use of insulin: Secondary | ICD-10-CM | POA: Diagnosis not present

## 2022-12-23 DIAGNOSIS — J449 Chronic obstructive pulmonary disease, unspecified: Secondary | ICD-10-CM | POA: Diagnosis not present

## 2022-12-23 DIAGNOSIS — E1165 Type 2 diabetes mellitus with hyperglycemia: Secondary | ICD-10-CM | POA: Diagnosis not present

## 2022-12-23 DIAGNOSIS — R569 Unspecified convulsions: Secondary | ICD-10-CM | POA: Diagnosis not present

## 2022-12-23 DIAGNOSIS — R5381 Other malaise: Secondary | ICD-10-CM | POA: Diagnosis not present

## 2022-12-23 DIAGNOSIS — R2689 Other abnormalities of gait and mobility: Secondary | ICD-10-CM | POA: Diagnosis not present

## 2022-12-23 DIAGNOSIS — R278 Other lack of coordination: Secondary | ICD-10-CM | POA: Diagnosis not present

## 2022-12-29 ENCOUNTER — Other Ambulatory Visit: Payer: Self-pay | Admitting: Internal Medicine

## 2023-01-11 ENCOUNTER — Other Ambulatory Visit: Payer: Self-pay | Admitting: Internal Medicine

## 2023-01-11 DIAGNOSIS — J441 Chronic obstructive pulmonary disease with (acute) exacerbation: Secondary | ICD-10-CM | POA: Diagnosis not present

## 2023-01-11 DIAGNOSIS — I5020 Unspecified systolic (congestive) heart failure: Secondary | ICD-10-CM | POA: Diagnosis not present

## 2023-01-11 DIAGNOSIS — I10 Essential (primary) hypertension: Secondary | ICD-10-CM | POA: Diagnosis not present

## 2023-01-18 ENCOUNTER — Ambulatory Visit (INDEPENDENT_AMBULATORY_CARE_PROVIDER_SITE_OTHER): Payer: Medicare Other | Admitting: Medical

## 2023-01-18 ENCOUNTER — Encounter: Payer: Self-pay | Admitting: Medical

## 2023-01-18 ENCOUNTER — Ambulatory Visit (HOSPITAL_BASED_OUTPATIENT_CLINIC_OR_DEPARTMENT_OTHER)
Admission: RE | Admit: 2023-01-18 | Discharge: 2023-01-18 | Disposition: A | Discharge status: Home / Self Care | Payer: Medicare Other | Source: Ambulatory Visit | Attending: Medical | Admitting: Medical

## 2023-01-18 VITALS — BP 155/85 | HR 74 | Resp 18 | Ht 61.0 in | Wt 133.0 lb

## 2023-01-18 DIAGNOSIS — M79632 Pain in left forearm: Secondary | ICD-10-CM | POA: Diagnosis not present

## 2023-01-18 DIAGNOSIS — M25532 Pain in left wrist: Secondary | ICD-10-CM | POA: Insufficient documentation

## 2023-01-18 DIAGNOSIS — M79642 Pain in left hand: Secondary | ICD-10-CM

## 2023-01-18 DIAGNOSIS — I10 Essential (primary) hypertension: Secondary | ICD-10-CM

## 2023-01-18 MED ORDER — TRAMADOL HCL 50 MG PO TABS: ORAL_TABLET | ORAL | 0 refills | Status: DC

## 2023-01-18 NOTE — Progress Notes (Signed)
   Subjective:    Patient ID: Adrienne Bennett, female    DOB: [birth year], 62 y.o.   MRN: [id number]  HPI  Pt in for fall 1 week ago. Pt states tripped over peace of card board.  Long.  Pt states pain has been for all day  Fell on tile floor. Pain in left forearm, left wrist and fingers.   Pt states tried voltaren cream but did not help. Pt states 2 tylenol will help some before she goes to sleep and helps some.  Rt forearm bruise mid aspect but no pain on that side.  Htn- amlodipine 10 mg daily. Lopressor 1/2 50 mg in am and 1/2 50 mg at night. Lopressor was adjusted/ decreased prior to her colonsocopy. Since colonoscopy bp has been 149-150 systolic.    Review of Systems  Constitutional:  Negative for chills, fatigue and fever.  Respiratory:  Negative for chest tightness, shortness of breath and wheezing.   Cardiovascular:  Negative for chest pain and palpitations.  Gastrointestinal:  Negative for anal bleeding.  Musculoskeletal:  Negative for back pain and joint swelling.       Left forearm, wrist and hand/finger pain.  Neurological:  Negative for dizziness and light-headedness.  Hematological:  Negative for adenopathy. Does not bruise/bleed easily.         Objective:   Physical Exam  General Mental Status- Alert. General Appearance- Not in acute distress.   Skin General: Color- Normal Color. Moisture- Normal Moisture.  Neck Carotid Arteries- Normal color. Moisture- Normal Moisture. No carotid bruits. No JVD.  Chest and Lung Exam Auscultation: Breath Sounds:-Normal.  Cardiovascular Auscultation:Rythm- Regular. Murmurs & Other Heart Sounds:Auscultation of the heart reveals- No Murmurs.  Abdomen Inspection:-Inspeection Normal. Palpation/Percussion:Note:No mass. Palpation and Percussion of the abdomen reveal- Non Tender, Non Distended + BS, no rebound or guarding.  Neurologic Cranial Nerve exam:- CN III-XII intact(No nystagmus), symmetric smile. Strength:- 5/5  equal and symmetric strength both upper and lower extremities.   Left elbow- good rom. No pain.  Left forearm- distal 1/3 aspect tender to palpation. Left wrist- tender to palpation. Left hand- no scaphoid tenderness. No hand pain directly but when attempts to flex finges reports pain.  Rt upper ext- small bruise rt foream. No pain on palpatino. No elbow, wrist or hand pain.    Assessment & Plan:    Pain of left forearm - DG Forearm Left; Future  Wrist pain, acute, left - DG Wrist 2 Views Left; Future  Pain of left hand - DG Hand Complete Left; Future  For above area pain continue tylenol for pain. Expect to get stat xray back today. Then decide on tramadol vs norco.  Hypertension, unspecified type Continue amlodipine. Advise get back on former dose metoprolol 50 mg twice daily. Check bp daily with new machine or at gym.  Follow up date to be determined after imaging review.  Esperanza Richters, PA-C

## 2023-01-18 NOTE — Addendum Note (Signed)
Addended by: Gwenevere Abbot on: 01/18/2023 12:48 PM   Modules accepted: Orders

## 2023-01-18 NOTE — Patient Instructions (Addendum)
Pain of left forearm - DG Forearm Left; Future  Wrist pain, acute, left - DG Wrist 2 Views Left; Future  Pain of left hand - DG Hand Complete Left; Future  For above area pain continue tylenol for pain. Expect to get stat xray back today. Then decide on tramadol vs norco.(Chose tradmadol since no fracture seen but described 8/10 level pain at times)  Hypertension, unspecified type Continue amlodipine. Advise get back on former dose metoprolol 50 mg twice daily. Check bp daily with new machine or at gym.  Follow up date to be determined after imaging review.

## 2023-01-20 ENCOUNTER — Encounter: Payer: Self-pay | Admitting: Internal Medicine

## 2023-02-01 ENCOUNTER — Telehealth: Payer: Self-pay | Admitting: Cardiology

## 2023-02-01 NOTE — Telephone Encounter (Signed)
Pt c/o BP issue: STAT if pt c/o blurred vision, one-sided weakness or slurred speech  1. What are your last 5 BP readings? 173, 174, 175. Does not have her diastolic BP readings  2. Are you having any other symptoms (ex. Dizziness, headache, blurred vision, passed out)? No, denies any symptoms  3. What is your BP issue? Elevated BP

## 2023-02-02 ENCOUNTER — Observation Stay (HOSPITAL_BASED_OUTPATIENT_CLINIC_OR_DEPARTMENT_OTHER)
Admission: EM | Admit: 2023-02-02 | Discharge: 2023-02-03 | Disposition: A | Discharge status: Home / Self Care | Payer: Medicare Other | Attending: Internal Medicine | Admitting: Internal Medicine

## 2023-02-02 ENCOUNTER — Emergency Department (HOSPITAL_BASED_OUTPATIENT_CLINIC_OR_DEPARTMENT_OTHER): Payer: Medicare Other

## 2023-02-02 ENCOUNTER — Other Ambulatory Visit: Payer: Self-pay

## 2023-02-02 ENCOUNTER — Encounter (HOSPITAL_BASED_OUTPATIENT_CLINIC_OR_DEPARTMENT_OTHER): Payer: Self-pay | Admitting: Pediatrics

## 2023-02-02 DIAGNOSIS — I110 Hypertensive heart disease with heart failure: Secondary | ICD-10-CM | POA: Insufficient documentation

## 2023-02-02 DIAGNOSIS — Z7901 Long term (current) use of anticoagulants: Secondary | ICD-10-CM | POA: Diagnosis not present

## 2023-02-02 DIAGNOSIS — R0789 Other chest pain: Secondary | ICD-10-CM | POA: Insufficient documentation

## 2023-02-02 DIAGNOSIS — J90 Pleural effusion, not elsewhere classified: Secondary | ICD-10-CM | POA: Diagnosis not present

## 2023-02-02 DIAGNOSIS — J449 Chronic obstructive pulmonary disease, unspecified: Secondary | ICD-10-CM | POA: Diagnosis not present

## 2023-02-02 DIAGNOSIS — R002 Palpitations: Secondary | ICD-10-CM

## 2023-02-02 DIAGNOSIS — I10 Essential (primary) hypertension: Secondary | ICD-10-CM | POA: Diagnosis present

## 2023-02-02 DIAGNOSIS — I517 Cardiomegaly: Secondary | ICD-10-CM | POA: Diagnosis not present

## 2023-02-02 DIAGNOSIS — I483 Typical atrial flutter: Secondary | ICD-10-CM | POA: Insufficient documentation

## 2023-02-02 DIAGNOSIS — E876 Hypokalemia: Principal | ICD-10-CM | POA: Diagnosis present

## 2023-02-02 DIAGNOSIS — R918 Other nonspecific abnormal finding of lung field: Secondary | ICD-10-CM | POA: Diagnosis not present

## 2023-02-02 DIAGNOSIS — E119 Type 2 diabetes mellitus without complications: Secondary | ICD-10-CM

## 2023-02-02 DIAGNOSIS — I4891 Unspecified atrial fibrillation: Secondary | ICD-10-CM

## 2023-02-02 DIAGNOSIS — E1140 Type 2 diabetes mellitus with diabetic neuropathy, unspecified: Secondary | ICD-10-CM | POA: Insufficient documentation

## 2023-02-02 DIAGNOSIS — Z91040 Latex allergy status: Secondary | ICD-10-CM | POA: Diagnosis not present

## 2023-02-02 DIAGNOSIS — R079 Chest pain, unspecified: Secondary | ICD-10-CM | POA: Diagnosis not present

## 2023-02-02 DIAGNOSIS — I5032 Chronic diastolic (congestive) heart failure: Secondary | ICD-10-CM | POA: Diagnosis present

## 2023-02-02 DIAGNOSIS — I493 Ventricular premature depolarization: Secondary | ICD-10-CM | POA: Diagnosis not present

## 2023-02-02 DIAGNOSIS — Z79899 Other long term (current) drug therapy: Secondary | ICD-10-CM | POA: Diagnosis not present

## 2023-02-02 DIAGNOSIS — E7800 Pure hypercholesterolemia, unspecified: Secondary | ICD-10-CM | POA: Diagnosis present

## 2023-02-02 DIAGNOSIS — I480 Paroxysmal atrial fibrillation: Secondary | ICD-10-CM | POA: Diagnosis not present

## 2023-02-02 DIAGNOSIS — I491 Atrial premature depolarization: Secondary | ICD-10-CM | POA: Diagnosis present

## 2023-02-02 LAB — COMPREHENSIVE METABOLIC PANEL
ALT: 14 U/L (ref 0–44)
AST: 21 U/L (ref 15–41)
Albumin: 2.8 g/dL — ABNORMAL LOW (ref 3.5–5.0)
Alkaline Phosphatase: 101 U/L (ref 38–126)
Anion gap: 8 (ref 5–15)
BUN: 18 mg/dL (ref 8–23)
CO2: 32 mmol/L (ref 22–32)
Calcium: 7.7 mg/dL — ABNORMAL LOW (ref 8.9–10.3)
Chloride: 92 mmol/L — ABNORMAL LOW (ref 98–111)
Creatinine, Ser: 1.51 mg/dL — ABNORMAL HIGH (ref 0.44–1.00)
GFR, Estimated: 39 mL/min — ABNORMAL LOW (ref >60)
Glucose, Bld: 419 mg/dL — ABNORMAL HIGH (ref 70–99)
Potassium: 2.4 mmol/L — CL (ref 3.5–5.1)
Sodium: 132 mmol/L — ABNORMAL LOW (ref 135–145)
Total Bilirubin: 0.5 mg/dL (ref 0.3–1.2)
Total Protein: 6.8 g/dL (ref 6.5–8.1)

## 2023-02-02 LAB — CBC WITH DIFFERENTIAL/PLATELET
Abs Immature Granulocytes: 0.01 10*3/uL (ref 0.00–0.07)
Basophils Absolute: 0 10*3/uL (ref 0.0–0.1)
Basophils Relative: 1 %
Eosinophils Absolute: 0.1 10*3/uL (ref 0.0–0.5)
Eosinophils Relative: 2 %
HCT: 37.9 % (ref 36.0–46.0)
Hemoglobin: 12.8 g/dL (ref 12.0–15.0)
Immature Granulocytes: 0 %
Lymphocytes Relative: 23 %
Lymphs Abs: 1.9 10*3/uL (ref 0.7–4.0)
MCH: 29.7 pg (ref 26.0–34.0)
MCHC: 33.8 g/dL (ref 30.0–36.0)
MCV: 87.9 fL (ref 80.0–100.0)
Monocytes Absolute: 0.6 10*3/uL (ref 0.1–1.0)
Monocytes Relative: 7 %
Neutro Abs: 5.7 10*3/uL (ref 1.7–7.7)
Neutrophils Relative %: 67 %
Platelets: 206 10*3/uL (ref 150–400)
RBC: 4.31 MIL/uL (ref 3.87–5.11)
RDW: 13.3 % (ref 11.5–15.5)
WBC: 8.4 10*3/uL (ref 4.0–10.5)
nRBC: 0 % (ref 0.0–0.2)

## 2023-02-02 LAB — BASIC METABOLIC PANEL
Anion gap: 15 (ref 5–15)
Anion gap: 12 (ref 5–15)
BUN: 20 mg/dL (ref 8–23)
BUN: 19 mg/dL (ref 8–23)
CO2: 33 mmol/L — ABNORMAL HIGH (ref 22–32)
CO2: 34 mmol/L — ABNORMAL HIGH (ref 22–32)
Calcium: 7.6 mg/dL — ABNORMAL LOW (ref 8.9–10.3)
Calcium: 7.4 mg/dL — ABNORMAL LOW (ref 8.9–10.3)
Chloride: 86 mmol/L — ABNORMAL LOW (ref 98–111)
Chloride: 88 mmol/L — ABNORMAL LOW (ref 98–111)
Creatinine, Ser: 1.03 mg/dL — ABNORMAL HIGH (ref 0.44–1.00)
Creatinine, Ser: 1.02 mg/dL — ABNORMAL HIGH (ref 0.44–1.00)
GFR, Estimated: >60 mL/min (ref >60)
GFR, Estimated: >60 mL/min (ref >60)
Glucose, Bld: 245 mg/dL — ABNORMAL HIGH (ref 70–99)
Glucose, Bld: 254 mg/dL — ABNORMAL HIGH (ref 70–99)
Potassium: 1.8 mmol/L — CL (ref 3.5–5.1)
Potassium: 2.1 mmol/L — CL (ref 3.5–5.1)
Sodium: 134 mmol/L — ABNORMAL LOW (ref 135–145)
Sodium: 134 mmol/L — ABNORMAL LOW (ref 135–145)

## 2023-02-02 LAB — TROPONIN I (HIGH SENSITIVITY)
Troponin I (High Sensitivity): 29 ng/L — ABNORMAL HIGH (ref <18)
Troponin I (High Sensitivity): 29 ng/L — ABNORMAL HIGH (ref <18)

## 2023-02-02 LAB — BRAIN NATRIURETIC PEPTIDE: B Natriuretic Peptide: 410.3 pg/mL — ABNORMAL HIGH (ref 0.0–100.0)

## 2023-02-02 LAB — MAGNESIUM
Magnesium: 1.6 mg/dL — ABNORMAL LOW (ref 1.7–2.4)
Magnesium: 2.1 mg/dL (ref 1.7–2.4)

## 2023-02-02 LAB — GLUCOSE, CAPILLARY: Glucose-Capillary: 369 mg/dL — ABNORMAL HIGH (ref 70–99)

## 2023-02-02 LAB — TSH: TSH: 1.683 u[IU]/mL (ref 0.350–4.500)

## 2023-02-02 MED ORDER — ACETAMINOPHEN 650 MG RE SUPP: 650.0000 mg | Freq: Four times a day (QID) | RECTAL | Status: DC | PRN

## 2023-02-02 MED ORDER — FLUTICASONE FUROATE-VILANTEROL 100-25 MCG/ACT IN AEPB
1.0000 | INHALATION_SPRAY | Freq: Every day | RESPIRATORY_TRACT | Status: DC
  Administered 2023-02-02: 1 via RESPIRATORY_TRACT
  Filled 2023-02-02: qty 28

## 2023-02-02 MED ORDER — DIVALPROEX SODIUM 250 MG PO DR TAB
250.0000 mg | DELAYED_RELEASE_TABLET | Freq: Every morning | ORAL | Status: DC
  Administered 2023-02-03: 250 mg via ORAL
  Filled 2023-02-02: qty 1

## 2023-02-02 MED ORDER — SODIUM CHLORIDE 0.9% FLUSH
3.0000 mL | Freq: Two times a day (BID) | INTRAVENOUS | Status: DC
  Administered 2023-02-02 – 2023-02-03 (×2): 3 mL via INTRAVENOUS

## 2023-02-02 MED ORDER — DIVALPROEX SODIUM 250 MG PO DR TAB: 250.0000 mg | DELAYED_RELEASE_TABLET | ORAL | Status: DC

## 2023-02-02 MED ORDER — METOPROLOL TARTRATE 50 MG PO TABS
50.0000 mg | ORAL_TABLET | Freq: Two times a day (BID) | ORAL | Status: DC
  Administered 2023-02-02 – 2023-02-03 (×2): 50 mg via ORAL
  Filled 2023-02-02 (×2): qty 1

## 2023-02-02 MED ORDER — DIVALPROEX SODIUM 250 MG PO DR TAB
500.0000 mg | DELAYED_RELEASE_TABLET | Freq: Every day | ORAL | Status: DC
  Administered 2023-02-02: 500 mg via ORAL
  Filled 2023-02-02: qty 2

## 2023-02-02 MED ORDER — SODIUM CHLORIDE 0.9 % IV SOLN: INTRAVENOUS | Status: DC | PRN

## 2023-02-02 MED ORDER — CLONAZEPAM 0.5 MG PO TABS
1.0000 mg | ORAL_TABLET | Freq: Two times a day (BID) | ORAL | Status: DC
  Administered 2023-02-02 – 2023-02-03 (×2): 1 mg via ORAL
  Filled 2023-02-02 (×2): qty 2

## 2023-02-02 MED ORDER — POTASSIUM CHLORIDE 10 MEQ/100ML IV SOLN
10.0000 meq | INTRAVENOUS | Status: AC
  Administered 2023-02-02 (×5): 10 meq via INTRAVENOUS
  Filled 2023-02-02 (×6): qty 100

## 2023-02-02 MED ORDER — ATORVASTATIN CALCIUM 40 MG PO TABS
40.0000 mg | ORAL_TABLET | Freq: Every day | ORAL | Status: DC
  Administered 2023-02-02: 40 mg via ORAL
  Filled 2023-02-02: qty 1

## 2023-02-02 MED ORDER — APIXABAN 5 MG PO TABS
5.0000 mg | ORAL_TABLET | Freq: Two times a day (BID) | ORAL | Status: DC
  Administered 2023-02-02 – 2023-02-03 (×2): 5 mg via ORAL
  Filled 2023-02-02 (×2): qty 1

## 2023-02-02 MED ORDER — HYDRALAZINE HCL 20 MG/ML IJ SOLN
10.0000 mg | INTRAMUSCULAR | Status: DC | PRN
  Administered 2023-02-02: 10 mg via INTRAVENOUS
  Filled 2023-02-02: qty 1

## 2023-02-02 MED ORDER — ACETAMINOPHEN 325 MG PO TABS: 650.0000 mg | ORAL_TABLET | Freq: Four times a day (QID) | ORAL | Status: DC | PRN

## 2023-02-02 MED ORDER — INSULIN ASPART 100 UNIT/ML IJ SOLN
0.0000 [IU] | Freq: Every day | INTRAMUSCULAR | Status: DC
  Administered 2023-02-02: 5 [IU] via SUBCUTANEOUS

## 2023-02-02 MED ORDER — DULOXETINE HCL 60 MG PO CPEP
60.0000 mg | ORAL_CAPSULE | Freq: Every morning | ORAL | Status: DC
  Administered 2023-02-02 – 2023-02-03 (×2): 60 mg via ORAL
  Filled 2023-02-02 (×2): qty 1

## 2023-02-02 MED ORDER — POTASSIUM CHLORIDE CRYS ER 20 MEQ PO TBCR
40.0000 meq | EXTENDED_RELEASE_TABLET | Freq: Once | ORAL | Status: AC
  Administered 2023-02-02: 40 meq via ORAL
  Filled 2023-02-02: qty 2

## 2023-02-02 MED ORDER — SENNOSIDES-DOCUSATE SODIUM 8.6-50 MG PO TABS: 1.0000 | ORAL_TABLET | Freq: Every evening | ORAL | Status: DC | PRN

## 2023-02-02 MED ORDER — MAGNESIUM SULFATE 2 GM/50ML IV SOLN
2.0000 g | Freq: Once | INTRAVENOUS | Status: AC
  Administered 2023-02-02: 2 g via INTRAVENOUS
  Filled 2023-02-02: qty 50

## 2023-02-02 MED ORDER — AMLODIPINE BESYLATE 10 MG PO TABS
10.0000 mg | ORAL_TABLET | Freq: Every day | ORAL | Status: DC
  Administered 2023-02-02 – 2023-02-03 (×2): 10 mg via ORAL
  Filled 2023-02-02 (×2): qty 1

## 2023-02-02 MED ORDER — POTASSIUM CHLORIDE 10 MEQ/100ML IV SOLN
10.0000 meq | INTRAVENOUS | Status: AC
  Administered 2023-02-03 (×6): 10 meq via INTRAVENOUS
  Filled 2023-02-02 (×6): qty 100

## 2023-02-02 MED ORDER — CALCIUM GLUCONATE-NACL 1-0.675 GM/50ML-% IV SOLN
1.0000 g | Freq: Once | INTRAVENOUS | Status: AC
  Administered 2023-02-02: 1000 mg via INTRAVENOUS
  Filled 2023-02-02: qty 50

## 2023-02-02 MED ORDER — ALBUTEROL SULFATE HFA 108 (90 BASE) MCG/ACT IN AERS: 2.0000 | INHALATION_SPRAY | Freq: Four times a day (QID) | RESPIRATORY_TRACT | Status: DC | PRN

## 2023-02-02 MED ORDER — ONDANSETRON HCL 4 MG PO TABS: 4.0000 mg | ORAL_TABLET | Freq: Four times a day (QID) | ORAL | Status: DC | PRN

## 2023-02-02 MED ORDER — DIPHENHYDRAMINE HCL 25 MG PO CAPS: 25.0000 mg | ORAL_CAPSULE | Freq: Four times a day (QID) | ORAL | Status: DC | PRN

## 2023-02-02 MED ORDER — INSULIN ASPART 100 UNIT/ML IJ SOLN
0.0000 [IU] | Freq: Three times a day (TID) | INTRAMUSCULAR | Status: DC
  Administered 2023-02-03: 5 [IU] via SUBCUTANEOUS
  Administered 2023-02-03: 3 [IU] via SUBCUTANEOUS

## 2023-02-02 MED ORDER — ONDANSETRON HCL 4 MG/2ML IJ SOLN: 4.0000 mg | Freq: Four times a day (QID) | INTRAMUSCULAR | Status: DC | PRN

## 2023-02-02 MED ORDER — FENTANYL CITRATE PF 50 MCG/ML IJ SOSY
50.0000 ug | PREFILLED_SYRINGE | Freq: Once | INTRAMUSCULAR | Status: AC
  Administered 2023-02-02: 50 ug via INTRAVENOUS
  Filled 2023-02-02: qty 1

## 2023-02-02 NOTE — ED Notes (Signed)
Report given to floor nurse at [facility]

## 2023-02-02 NOTE — ED Triage Notes (Signed)
Reports hx of Afib and when she felt her heart racing this morning while seating down.

## 2023-02-02 NOTE — Progress Notes (Signed)
Plan of Care Note for accepted transfer   Patient: Adrienne Bennett MRN: [id number]   DOA: 02/02/2023  Facility requesting transfer: [facility] Requesting Provider: Charm Barges Reason for transfer: Ectopy  Facility course: Patient with h/o bipolar, chronic diastolic CHF, DM, HTN, afib on Eliquis, and HLD  presenting with palpitations.  Presented with feeling heart racing, occasional flutter/fib and PVCs.  K+ 1.8, Mag++ 1.6.  She is having significant ectopy, will observe overnight.  Will recheck BMP/Mag++ to ensure accuracy of initial draw.   Plan of care: The patient is accepted for admission to Telemetry unit, at [facility].   Author: Jonah Blue, MD 02/02/2023  Check [email or web address] for on-call coverage.  Nursing staff, Please call [facility] Admits & Consults System-Wide number on Amion as soon as patient's arrival, so appropriate admitting provider can evaluate the pt.

## 2023-02-02 NOTE — ED Notes (Signed)
[facility] into transport 

## 2023-02-02 NOTE — ED Notes (Signed)
Attempted IV x 1.  Unable to thread IV, small hematoma noted.

## 2023-02-02 NOTE — ED Provider Notes (Signed)
[facility] Provider Note   CSN: [id number] Arrival date & time: 02/02/23  0915     History  Chief Complaint  Patient presents with   Tachycardia    Adrienne Bennett is a 62 y.o. female.  She has a history of paroxysmal A-fib on Eliquis and beta-blocker, thyroid disease, CHF.  She said her blood pressure has been elevated over the last week or so.  She is waiting for callback from her cardiologist.  Today she felt like her heart was beating fast and hard and it caused her to feel short of breath.  She denies any fevers chills nausea vomiting diarrhea.  No urinary symptoms.  Has been taking her medicines regularly.  The history is provided by the patient and the spouse.  Chest Pain Pain quality: tightness   Pain radiates to:  Does not radiate Pain severity:  Moderate Onset quality:  Sudden Duration:  1 day Timing:  Constant Progression:  Unchanged Chronicity:  Recurrent Context: at rest   Relieved by:  None tried Worsened by:  Nothing Ineffective treatments:  None tried Associated symptoms: shortness of breath   Associated symptoms: no abdominal pain, no cough, no diaphoresis, no fever, no nausea and no vomiting        Home Medications Prior to Admission medications   Medication Sig Start Date End Date Taking? Authorizing Provider  albuterol (VENTOLIN HFA) 108 (90 Base) MCG/ACT inhaler Inhale 2 puffs into the lungs every 6 (six) hours as needed for wheezing or shortness of breath. 02/26/21   [name], MD  amLODipine (NORVASC) 10 MG tablet Take 1 tablet (10 mg total) by mouth daily. 11/30/22   [name], MD  apixaban (ELIQUIS) 5 MG TABS tablet Take 1 tablet (5 mg total) by mouth 2 (two) times daily. 01/11/23   [name], MD  atorvastatin (LIPITOR) 40 MG tablet Take 1 tablet (40 mg total) by mouth at bedtime. 12/11/22   Paz, [name], MD  BREO ELLIPTA 100-25 MCG/ACT AEPB Inhale 1 puff into the lungs daily. 12/01/22   [name], MD   clonazePAM (KLONOPIN) 1 MG tablet Take 1 mg by mouth 2 (two) times daily.  rx by psychiatry    [provider]  dapagliflozin propanediol (FARXIGA) 10 MG TABS tablet Take 1 tablet (10 mg total) by mouth daily. 02/19/22   [name], MD  divalproex (DEPAKOTE) 250 MG DR tablet Take 250-500 mg by mouth See admin instructions. Take one tablet (250 mg) by mouth every morning and  (500 mg) at night    [provider]  furosemide (LASIX) 80 MG tablet Take 0.5 tablets (40 mg total) by mouth daily. 09/15/22   [name], MD  glimepiride (AMARYL) 2 MG tablet Take 1 tablet by mouth daily before breakfast. 07/29/22   [provider]  ipratropium-albuterol (DUONEB) 0.5-2.5 (3) MG/3ML SOLN Take 3 mLs by nebulization 3 (three) times daily as needed. Patient taking differently: Take 3 mLs by nebulization 3 (three) times daily as needed (shortness of breath/wheezing). 01/10/22   Lanae Boast, MD  metoprolol tartrate (LOPRESSOR) 50 MG tablet Take 1 tablet (50 mg total) by mouth 2 (two) times daily. 12/29/22   [name], MD  OXYGEN Inhale 2 L/min into the lungs continuous. Patient not taking: Reported on 12/14/2022    [provider]  oxymetazoline (AFRIN) 0.05 % nasal spray Place 1 spray into both nostrils 2 (two) times daily as needed  for congestion (nose bleed).    [provider]  primidone (MYSOLINE) 50 MG tablet Take 50 mg by mouth 2 (two) times daily. 10/20/22   [provider]  traMADol (ULTRAM) 50 MG tablet 1 tab po q 8 hours prn severe pain 01/18/23   Saguier, Ramon Dredge, PA-C  triamcinolone lotion (KENALOG) 0.1 % Apply 1 Application topically 3 (three) times daily. 09/14/22   [name], MD      Allergies    Benztropine, Coconut (cocos nucifera), Lactose intolerance (gi), Lisinopril, Other, Penicillins, Xanax [alprazolam], and Monistat 3 combo pack app  [miconazole nitrate]    Review of Systems   Review of Systems  Constitutional:  Negative for diaphoresis and  fever.  Respiratory:  Positive for shortness of breath. Negative for cough.   Cardiovascular:  Positive for chest pain.  Gastrointestinal:  Negative for abdominal pain, nausea and vomiting.    Physical Exam Updated Vital Signs BP (!) 188/81   Pulse 79   Temp 98.1 F (36.7 C)   Resp 14   Ht 5\' 1"  (1.549 m)   Wt 60.3 kg   LMP 08/24/2015 (Exact Date)   SpO2 98%   BMI 25.13 kg/m  Physical Exam Vitals and nursing note reviewed.  Constitutional:      General: She is not in acute distress.    Appearance: Normal appearance. She is well-developed.  HENT:     Head: Normocephalic and atraumatic.  Eyes:     Conjunctiva/sclera: Conjunctivae normal.  Cardiovascular:     Rate and Rhythm: Normal rate. Rhythm irregular.     Heart sounds: No murmur heard. Pulmonary:     Effort: Pulmonary effort is normal. No respiratory distress.     Breath sounds: Normal breath sounds.  Abdominal:     Palpations: Abdomen is soft.     Tenderness: There is no abdominal tenderness. There is no guarding or rebound.  Musculoskeletal:        General: No swelling. Normal range of motion.     Cervical back: Neck supple.     Right lower leg: No edema.     Left lower leg: No edema.  Skin:    General: Skin is warm and dry.     Capillary Refill: Capillary refill takes less than 2 seconds.  Neurological:     General: No focal deficit present.     Mental Status: She is alert.     Sensory: No sensory deficit.     Motor: No weakness.     ED Results / Procedures / Treatments   Labs (all labs ordered are listed, but only abnormal results are displayed) Labs Reviewed  BASIC METABOLIC PANEL - Abnormal; Notable for the following components:      Result Value   Sodium 134 (*)    Potassium 1.8 (*)    Chloride 86 (*)    CO2 33 (*)    Glucose, Bld 245 (*)    Creatinine, Ser 1.03 (*)    Calcium 7.6 (*)    All other components within normal limits  BRAIN NATRIURETIC PEPTIDE - Abnormal; Notable for the  following components:   B Natriuretic Peptide 410.3 (*)    All other components within normal limits  MAGNESIUM - Abnormal; Notable for the following components:   Magnesium 1.6 (*)    All other components within normal limits  BASIC METABOLIC PANEL - Abnormal; Notable for the following components:   Sodium 134 (*)    Potassium 2.1 (*)    Chloride 88 (*)  CO2 34 (*)    Glucose, Bld 254 (*)    Creatinine, Ser 1.02 (*)    Calcium 7.4 (*)    All other components within normal limits  TROPONIN I (HIGH SENSITIVITY) - Abnormal; Notable for the following components:   Troponin I (High Sensitivity) 29 (*)    All other components within normal limits  TROPONIN I (HIGH SENSITIVITY) - Abnormal; Notable for the following components:   Troponin I (High Sensitivity) 29 (*)    All other components within normal limits  CBC WITH DIFFERENTIAL/PLATELET  TSH    EKG EKG Interpretation Date/Time:  Tuesday February 02 2023 09:27:42 EDT Ventricular Rate:  90 PR Interval:  173 QRS Duration:  109 QT Interval:  459 QTC Calculation: 447 R Axis:   78  Text Interpretation: Sinus rhythm Ventricular bigeminy Biatrial enlargement Left ventricular hypertrophy Repol abnrm, severe global ischemia (LM/MVD) increased ectopy from prior 6/23 Confirmed by Meridee Score [phone]) on 02/02/2023 9:30:12 AM  Radiology DG Chest Port 1 View  Result Date: 02/02/2023 CLINICAL DATA:  cp EXAM: PORTABLE CHEST 1 VIEW COMPARISON:  CXR 09/14/22 FINDINGS: Unchanged enlarged cardiac and mediastinal contours. Likely small left pleural effusion. No pneumothorax. No new focal airspace opacity. There are prominent bilateral interstitial opacities that could represent pulmonary venous congestion or atypical infection. No radiographically apparent displaced rib fracture. Visualized upper abdomen is unremarkable IMPRESSION: Cardiomegaly, small left pleural effusion, and likely mild pulmonary edema versus pulmonary venous congestion  Electronically Signed   By: Lorenza Cambridge M.D.   On: 02/02/2023 10:33    Procedures .Critical Care  Performed by: Terrilee Files, MD Authorized by: Terrilee Files, MD   Critical care provider statement:    Critical care time (minutes):  45   Critical care time was exclusive of:  Separately billable procedures and treating other patients   Critical care was necessary to treat or prevent imminent or life-threatening deterioration of the following conditions:  Cardiac failure, circulatory failure and metabolic crisis   Critical care was time spent personally by me on the following activities:  Development of treatment plan with patient or surrogate, discussions with consultants, evaluation of patient's response to treatment, examination of patient, obtaining history from patient or surrogate, ordering and performing treatments and interventions, ordering and review of laboratory studies, ordering and review of radiographic studies, pulse oximetry, re-evaluation of patient's condition and review of old charts   I assumed direction of critical care for this patient from another provider in my specialty: no       Medications Ordered in ED Medications  magnesium sulfate IVPB 2 g 50 mL (2 g Intravenous New Bag/Given 02/02/23 1147)  potassium chloride 10 mEq in 100 mL IVPB (10 mEq Intravenous New Bag/Given 02/02/23 1144)  0.9 %  sodium chloride infusion ( Intravenous New Bag/Given 02/02/23 1141)  fentaNYL (SUBLIMAZE) injection 50 mcg (50 mcg Intravenous Given 02/02/23 1054)  potassium chloride SA (KLOR-CON M) CR tablet 40 mEq (40 mEq Oral Given 02/02/23 1148)    ED Course/ Medical Decision Making/ A&P Clinical Course as of 02/02/23 1221  Tue Feb 02, 2023  1012 Chest x-ray interpreted by me as cardiomegaly, widened mediastinum, similar to prior chest x-ray 4 months ago.  Awaiting radiology reading. [MB]    Clinical Course User Index [MB] Terrilee Files, MD                             Medical  Decision Making  Amount and/or Complexity of Data Reviewed Labs: ordered. Radiology: ordered.  Risk Prescription drug management. Decision regarding hospitalization.   This patient complains of chest tightness racing heart shortness of breath; this involves an extensive number of treatment Options and is a complaint that carries with it a high risk of complications and morbidity. The differential includes atrial fibrillation, ACS, dehydration, metabolic derangement, anemia, pneumonia, CHF  I ordered, reviewed and interpreted labs, which included CBC normal, chemistries with markedly low potassium elevated glucose normal renal function, magnesium low normal, troponins mildly elevated need to be trended, BNP elevated I ordered medication IV pain medication, IV magnesium and potassium and reviewed PMP when indicated. I ordered imaging studies which included chest x-ray and I independently    visualized and interpreted imaging which showed cardiomegaly possible mild edema Additional history obtained from patient's significant other Previous records obtained and reviewed in epic, patient did have admission for low potassium in the past I consulted [facility] hospitalist Dr. Ophelia Charter and discussed lab and imaging findings and discussed disposition.  Cardiac monitoring reviewed, sinus with episodes of frequent PVCs and frequent tachycardia Social determinants considered, social isolation Critical Interventions: IV repletion of electrolytes for patient's cardiac irritability  After the interventions stated above, I reevaluated the patient and found patient to be resting more comfortably Admission and further testing considered, would benefit from mission to the hospital for continued repletion of electrolytes and cardiac monitoring.  Patient in agreement with plan for admission.         Final Clinical Impression(s) / ED Diagnoses Final diagnoses:  Palpitations  PVC's (premature ventricular  contractions)  Hypokalemia  Hypomagnesemia  Chest tightness  Atrial fibrillation with rapid ventricular response [facility])    Rx / DC Orders ED Discharge Orders     None         Terrilee Files, MD 02/02/23 1652

## 2023-02-02 NOTE — ED Notes (Signed)
Called [facility] for transport to [facility] @4 :07pm

## 2023-02-02 NOTE — Hospital Course (Signed)
Adrienne Bennett is a 62 y.o. female with medical history significant for PAF on Eliquis, HFpEF (EF 60-65%), COPD, T2DM, HTN, HLD, bipolar disorder, depression/anxiety, PTSD who is admitted with multiple electrolyte abnormalities associated with atrial ectopy.

## 2023-02-02 NOTE — ED Notes (Signed)
Report given to [facility] 

## 2023-02-02 NOTE — H&P (Signed)
History and Physical    Adrienne Bennett:[id number] DOB: 15-May-1961 DOA: 02/02/2023  PCP: [name], MD  Patient coming from: Home  I have personally briefly reviewed patient's old medical records in [facility]  Chief Complaint: Palpitations  HPI: STACHA Adrienne Bennett is a 62 y.o. female with medical history significant for PAF on Eliquis, HFpEF (EF 60-65%), COPD, T2DM, HTN, HLD, bipolar disorder, depression/anxiety, PTSD who presents to the ED for evaluation of palpitations.  Patient reports developing rapid palpitations this morning and felt like her A-fib was acting up.  She had associated chest tightness and some shortness of breath.  She noted that her blood pressure has been running high at home for the last few days.  Systolic was as high as the 190s earlier today.  She denied any nausea, vomiting, abdominal pain, diarrhea.  She has not seen any swelling in her lower extremities.  She denies any recent medication changes.  She reports adherence to Eliquis and denies any obvious bleeding.  [facility] ED Course  Labs/Imaging on admission: I have personally reviewed following labs and imaging studies.  Initial vitals showed BP 188/81, pulse 81, RR 14, temp 98.1 F, SpO2 98% on room air.  Labs showed sodium 134, potassium 1.8, magnesium 1.6, chloride 86, bicarb 33, BUN 20, creatinine 1.03, serum glucose 245, calcium 7.6, BNP 410.3, troponin 29 x 2, TSH 1.683, WBC 8.4, hemoglobin 12.8, platelets 206,000.  Portable chest x-ray showed cardiomegaly, small left pleural effusion.  Patient was given IV magnesium 2 g, oral K 40 mEq, IV K10 X 6.  The hospitalist service was consulted to admit for further evaluation and management.  Review of Systems: All systems reviewed and are negative except as documented in history of present illness above.   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    GANGLION CYST 07/22/2010   Qualifier: Diagnosis of  By: [name] MD, Jose E.    GERD (gastroesophageal reflux disease) 05/23/2017   Hyperlipidemia 10/12/2012   IBS (irritable bowel syndrome)    chronic Diarrhea   Macular degeneration, dry    MIGRAINE HEADACHE 03/13/2010   Qualifier: Diagnosis of  By: [name] MD, [name].    Neck pain 02/03/2011   Osteoarthritis    "back; goes down my right leg" (09/01/2013)   Subclinical hyperthyroidism 02/26/2018   Thyromegaly 12/04/2014   Type II diabetes mellitus (HCC) 2007    Past Surgical History:  Procedure Laterality Date   CARPAL TUNNEL RELEASE Bilateral ~ 2000   cataract surgery Bilateral    TONSILLECTOMY  07/27/1968   TUBAL LIGATION  07/27/1994    Social History:  reports that she has never smoked. She has never used smokeless tobacco. She reports that she does not drink alcohol and does not use drugs.  Allergies  Allergen Reactions   Benztropine Other (See Comments)    Caused word salad  Other Reaction(s): Mental Status Changes   Coconut (Cocos Nucifera) Diarrhea   Lactose Intolerance (Gi) Diarrhea   Lisinopril Swelling    Dc 06-2015, had lip swelling, stomatitis   Other Other (See  Comments)    Steroid given in hospital mid June 2023 cause purple rash that crept up arm - per MD  "Etiology of her rash was uncertain.  Rash was resolved at the time of discharge with Benadryl and cortisone cream"   Penicillins Swelling    Facial swelling   Xanax [Alprazolam] Other (See Comments)    seizure   Monistat 3 Combo Pack App  [Miconazole Nitrate] Itching and Rash    Family History  Problem Relation Age of Onset   Diabetes Mother     Coronary artery disease Father        age? 50s?   Diabetes Sister    CAD Sister    Hypertension Sister    Coronary artery disease Brother        age?, 86s?   Hypertension Brother    Diabetes Maternal Grandmother    Colon cancer Neg Hx    Breast cancer Neg Hx    Stomach cancer Neg Hx    Rectal cancer Neg Hx    Esophageal cancer Neg Hx    Inflammatory bowel disease Neg Hx    Liver disease Neg Hx    Pancreatic cancer Neg Hx      Prior to Admission medications   Medication Sig Start Date End Date Taking? Authorizing Provider  albuterol (VENTOLIN HFA) 108 (90 Base) MCG/ACT inhaler Inhale 2 puffs into the lungs every 6 (six) hours as needed for wheezing or shortness of breath. 02/26/21  Yes Paz, [name], MD  amLODipine (NORVASC) 10 MG tablet Take 1 tablet (10 mg total) by mouth daily. 11/30/22  Yes Paz, [name], MD  apixaban (ELIQUIS) 5 MG TABS tablet Take 1 tablet (5 mg total) by mouth 2 (two) times daily. 01/11/23  Yes Paz, [name], MD  atorvastatin (LIPITOR) 40 MG tablet Take 1 tablet (40 mg total) by mouth at bedtime. 12/11/22  Yes Paz, Jose E, MD  BREO ELLIPTA 100-25 MCG/ACT AEPB Inhale 1 puff into the lungs daily. 12/01/22  Yes Paz, [name], MD  clonazePAM (KLONOPIN) 1 MG tablet Take 1 mg by mouth 2 (two) times daily.  rx by psychiatry   Yes [provider]  dapagliflozin propanediol (FARXIGA) 10 MG TABS tablet Take 1 tablet (10 mg total) by mouth daily. 02/19/22  Yes Paz, [name], MD  divalproex (DEPAKOTE) 250 MG DR tablet Take 250-500 mg by mouth See admin instructions. Take one tablet (250 mg) by mouth every morning and  (500 mg) at night   Yes [provider]  DULoxetine (CYMBALTA) 60 MG capsule Take 60 mg by mouth every morning. 01/18/23  Yes [provider]  furosemide (LASIX) 80 MG tablet Take 0.5 tablets (40 mg total) by mouth daily. 09/15/22  Yes Paz, [name], MD  glimepiride (AMARYL) 2 MG tablet Take 1 tablet by mouth daily before breakfast. 07/29/22  Yes  [provider]  ipratropium-albuterol (DUONEB) 0.5-2.5 (3) MG/3ML SOLN Take 3 mLs by nebulization 3 (three) times daily as needed. Patient taking differently: Take 3 mLs by nebulization 3 (three) times daily as needed (shortness of breath/wheezing). 01/10/22  Yes Lanae Boast, MD  metoprolol tartrate (LOPRESSOR) 50 MG tablet Take 1 tablet (50 mg total) by mouth 2 (two) times daily. 12/29/22  Yes Paz, [name], MD  oxymetazoline (AFRIN) 0.05 % nasal spray Place 1 spray into both nostrils 2 (two) times daily as needed for congestion (nose bleed).   Yes [provider]  triamcinolone lotion (KENALOG) 0.1 % Apply 1  Application topically 3 (three) times daily. Patient taking differently: Apply 1 Application topically as needed (skin). 09/14/22  Yes Paz, [name], MD  primidone (MYSOLINE) 50 MG tablet Take 50 mg by mouth 2 (two) times daily. Patient not taking: Reported on 02/02/2023 10/20/22   [provider]  traMADol (ULTRAM) 50 MG tablet 1 tab po q 8 hours prn severe pain Patient not taking: Reported on 02/02/2023 01/18/23   Esperanza Richters, PA-C    Physical Exam: Vitals:   02/02/23 1300 02/02/23 1330 02/02/23 1630 02/02/23 1750  BP: (!) 131/118 115/70 132/73 (!) 160/76  Pulse: (!) 59 62 72 75  Resp: (!) 21 20 (!) 21 17  Temp: 98.1 F (36.7 C)  98.2 F (36.8 C) 99.1 F (37.3 C)  TempSrc:    Oral  SpO2: 100% 100% 99% 95%  Weight:      Height:       Constitutional: Resting in bed, NAD, calm, comfortable Eyes: EOMI, lids and conjunctivae normal ENMT: Mucous membranes are moist. Posterior pharynx clear of any exudate or lesions.Normal dentition.  Neck: normal, supple, no masses. Respiratory: clear to auscultation bilaterally, no wheezing, no crackles. Normal respiratory effort. No accessory muscle use.  Cardiovascular: Regular rate and rhythm, no murmurs / rubs / gallops. No extremity edema. 2+ pedal pulses. Abdomen: no tenderness, no masses palpated.  Musculoskeletal: no  clubbing / cyanosis. No joint deformity upper and lower extremities. Good ROM, no contractures. Normal muscle tone.  Skin: no rashes, lesions, ulcers. No induration Neurologic: Sensation intact. Strength 5/5 in all 4.  Psychiatric: Normal judgment and insight. Alert and oriented x 3. Normal mood.   EKG: Personally reviewed. Initial EKG showed sinus rhythm, rate 90, ventricular bigeminy.  Repeat EKG showed atrial flutter, rate 129.  Previous EKG from 03/2022 showed sinus rhythm.  Assessment/Plan Principal Problem:   Hypokalemia Active Problems:   Essential hypertension   Type II diabetes mellitus (HCC)   High cholesterol   COPD (chronic obstructive pulmonary disease) (HCC)   Paroxysmal atrial fibrillation (HCC)   Chronic heart failure with preserved ejection fraction (HFpEF) (HCC)   Atrial ectopy   Hypomagnesemia   Hypocalcemia   RAVNEET BUTCHKO is a 62 y.o. female with medical history significant for PAF on Eliquis, HFpEF (EF 60-65%), COPD, T2DM, HTN, HLD, bipolar disorder, depression/anxiety, PTSD who is admitted with multiple electrolyte abnormalities associated with atrial ectopy.  Assessment and Plan: Severe hypokalemia Hypomagnesemia Hypocalcemia: Potassium magnesium supplementation initiated in the ED.  Will give IV calcium gluconate.  Keep on telemetry.  Repeat labs tonight and supplement further as needed.  Paroxysmal atrial fibrillation with atrial ectopy: In and out of A-fib/flutter while in the ED as well as having frequent PVCs.  This is likely secondary to above electrolyte abnormalities. -Correct electrolytes as above -Continue home Lopressor and Eliquis  Chronic HFpEF: Appears euvolemic on admission.  Continue Lopressor.  Holding Lasix given severe hypokalemia.  COPD: Stable without wheezing on admission.  Continue Breo Ellipta and albuterol as needed.  Type 2 diabetes: Holding home meds.  Placed on SSI.  Hypertension: Continue Lopressor and  amlodipine.  Hyperlipidemia: Continue atorvastatin.  Bipolar disorder/depression/anxiety/PTSD: Continue home Depakote, Cymbalta, Klonopin.   DVT prophylaxis:  apixaban (ELIQUIS) tablet 5 mg   Code Status: DNR, confirmed with patient on admission Family Communication: Discussed with patient, she has discussed with family Disposition Plan: From home and likely discharge to home pending clinical progress Consults called: None Severity of Illness: The appropriate patient status for this patient is  OBSERVATION. Observation status is judged to be reasonable and necessary in order to provide the required intensity of service to ensure the patient's safety. The patient's presenting symptoms, physical exam findings, and initial radiographic and laboratory data in the context of their medical condition is felt to place them at decreased risk for further clinical deterioration. Furthermore, it is anticipated that the patient will be medically stable for discharge from the hospital within 2 midnights of admission.   Darreld Mclean MD [facility]  If 7PM-7AM, please contact night-coverage [email or web address]  02/02/2023, 7:46 PM

## 2023-02-03 DIAGNOSIS — E876 Hypokalemia: Secondary | ICD-10-CM | POA: Diagnosis not present

## 2023-02-03 LAB — CBC
HCT: 32.5 % — ABNORMAL LOW (ref 36.0–46.0)
Hemoglobin: 10.6 g/dL — ABNORMAL LOW (ref 12.0–15.0)
MCH: 29.5 pg (ref 26.0–34.0)
MCHC: 32.6 g/dL (ref 30.0–36.0)
MCV: 90.5 fL (ref 80.0–100.0)
Platelets: 179 10*3/uL (ref 150–400)
RBC: 3.59 MIL/uL — ABNORMAL LOW (ref 3.87–5.11)
RDW: 13.5 % (ref 11.5–15.5)
WBC: 7.2 10*3/uL (ref 4.0–10.5)
nRBC: 0 % (ref 0.0–0.2)

## 2023-02-03 LAB — BASIC METABOLIC PANEL
Anion gap: 9 (ref 5–15)
Anion gap: 12 (ref 5–15)
BUN: 23 mg/dL (ref 8–23)
BUN: 20 mg/dL (ref 8–23)
CO2: 30 mmol/L (ref 22–32)
CO2: 30 mmol/L (ref 22–32)
Calcium: 8 mg/dL — ABNORMAL LOW (ref 8.9–10.3)
Calcium: 8.5 mg/dL — ABNORMAL LOW (ref 8.9–10.3)
Chloride: 93 mmol/L — ABNORMAL LOW (ref 98–111)
Chloride: 95 mmol/L — ABNORMAL LOW (ref 98–111)
Creatinine, Ser: 1.34 mg/dL — ABNORMAL HIGH (ref 0.44–1.00)
Creatinine, Ser: 1.08 mg/dL — ABNORMAL HIGH (ref 0.44–1.00)
GFR, Estimated: 45 mL/min — ABNORMAL LOW (ref >60)
GFR, Estimated: 58 mL/min — ABNORMAL LOW (ref >60)
Glucose, Bld: 317 mg/dL — ABNORMAL HIGH (ref 70–99)
Glucose, Bld: 287 mg/dL — ABNORMAL HIGH (ref 70–99)
Potassium: 2.2 mmol/L — CL (ref 3.5–5.1)
Potassium: 3.4 mmol/L — ABNORMAL LOW (ref 3.5–5.1)
Sodium: 132 mmol/L — ABNORMAL LOW (ref 135–145)
Sodium: 137 mmol/L (ref 135–145)

## 2023-02-03 LAB — GLUCOSE, CAPILLARY
Glucose-Capillary: 221 mg/dL — ABNORMAL HIGH (ref 70–99)
Glucose-Capillary: 268 mg/dL — ABNORMAL HIGH (ref 70–99)

## 2023-02-03 LAB — HIV ANTIBODY (ROUTINE TESTING W REFLEX): HIV Screen 4th Generation wRfx: NONREACTIVE

## 2023-02-03 LAB — MAGNESIUM: Magnesium: 2.1 mg/dL (ref 1.7–2.4)

## 2023-02-03 MED ORDER — POTASSIUM CHLORIDE CRYS ER 20 MEQ PO TBCR
40.0000 meq | EXTENDED_RELEASE_TABLET | Freq: Once | ORAL | Status: AC
  Administered 2023-02-03: 40 meq via ORAL
  Filled 2023-02-03: qty 2

## 2023-02-03 MED ORDER — FENTANYL CITRATE PF 50 MCG/ML IJ SOSY
25.0000 ug | PREFILLED_SYRINGE | Freq: Once | INTRAMUSCULAR | Status: AC
  Administered 2023-02-03: 25 ug via INTRAVENOUS
  Filled 2023-02-03: qty 1

## 2023-02-03 MED ORDER — NYSTATIN 100000 UNIT/GM EX CREA
TOPICAL_CREAM | Freq: Three times a day (TID) | CUTANEOUS | Status: DC
  Administered 2023-02-03: 1 via TOPICAL
  Filled 2023-02-03: qty 30

## 2023-02-03 MED ORDER — INSULIN ASPART 100 UNIT/ML IJ SOLN: 3.0000 [IU] | Freq: Three times a day (TID) | INTRAMUSCULAR | Status: DC

## 2023-02-03 MED ORDER — POTASSIUM CHLORIDE CRYS ER 20 MEQ PO TBCR: 20.0000 meq | EXTENDED_RELEASE_TABLET | Freq: Every day | ORAL | 1 refills | Status: DC

## 2023-02-03 MED ORDER — NYSTATIN 100000 UNIT/GM EX POWD
Freq: Three times a day (TID) | CUTANEOUS | Status: DC
  Filled 2023-02-03: qty 15

## 2023-02-03 NOTE — Inpatient Diabetes Management (Signed)
Inpatient Diabetes Program Recommendations  AACE/ADA: New Consensus Statement on Inpatient Glycemic Control (2015)  Target Ranges:  Prepandial:   less than 140 mg/dL      Peak postprandial:   less than 180 mg/dL (1-2 hours)      Critically ill patients:  140 - 180 mg/dL   Lab Results  Component Value Date   GLUCAP 268 (H) 02/03/2023   HGBA1C 8.1 11/18/2022    Latest Reference Range & Units 02/03/23 06:28 02/03/23 11:16  Glucose-Capillary 70 - 99 mg/dL 161 (H) 096 (H)  (H): Data is abnormally high Review of Glycemic Control  Diabetes history: type 2 Outpatient Diabetes medications: Farxiga 10 mg daily, Amaryl 2 mg daily Current orders for Inpatient glycemic control: Novolog SENSITIVE correction scale TID, Novolog 0-5 units HS scale  Inpatient Diabetes Program Recommendations:   Noted that blood sugars have been greater than 18 mg/dl.  Recommend adding Novolog 3 units TID with meals if patient eats at least 50% of meal and if blood sugars continue to be elevated.  Smith Mince RN BSN CDE Diabetes Coordinator Pager: [phone]  8am-5pm

## 2023-02-03 NOTE — Telephone Encounter (Signed)
Admitted 02-02-2023

## 2023-02-03 NOTE — Plan of Care (Signed)

## 2023-02-03 NOTE — Progress Notes (Signed)
Pt c/o 6/10 pain in her lower chest and abdomen. Describes pain as sudden and squeezing in nature. Reports that this same pain occurred when she was in the ER , states that she was given a one time dose of Fentanyl and it helped. Provider on call notified, see new order. Pt has no further concerns at this time, call bell within reach.

## 2023-02-03 NOTE — Care Management Obs Status (Signed)
MEDICARE OBSERVATION STATUS NOTIFICATION   Patient Details  Name: Adrienne Bennett MRN: [id number] Date of Birth: 10-Dec-1960   Medicare Observation Status Notification Given:  Yes    Leone Haven, RN 02/03/2023, 2:13 PM

## 2023-02-03 NOTE — Discharge Summary (Signed)
Physician Discharge Summary  Adrienne Bennett:[id number] DOB: [birth year] DOA: 02/02/2023  PCP: [name], MD  Admit date: 02/02/2023 Discharge date: 02/03/2023  Admitted From: Home Disposition:  Home   Recommendations for Outpatient Follow-up:  Follow up with PCP in 1 week Please obtain BMP in 1 week to check potassium level   Discharge Condition: Stable CODE STATUS: DNR  Diet recommendation: Heart healthy diet   Brief/Interim Summary: From H&P by Dr. Darreld Mclean: "Adrienne Bennett is a 62 y.o. female with medical history significant for PAF on Eliquis, HFpEF (EF 60-65%), COPD, T2DM, HTN, HLD, bipolar disorder, depression/anxiety, PTSD who presents to the ED for evaluation of palpitations.   Patient reports developing rapid palpitations this morning and felt like her A-fib was acting up.  She had associated chest tightness and some shortness of breath.  She noted that her blood pressure has been running high at home for the last few days.  Systolic was as high as the 190s earlier today.   She denied any nausea, vomiting, abdominal pain, diarrhea.  She has not seen any swelling in her lower extremities.  She denies any recent medication changes.  She reports adherence to Eliquis and denies any obvious bleeding.   [facility] ED Course  Labs/Imaging on admission:   Initial vitals showed BP 188/81, pulse 81, RR 14, temp 98.1 F, SpO2 98% on room air.   Labs showed sodium 134, potassium 1.8, magnesium 1.6, chloride 86, bicarb 33, BUN 20, creatinine 1.03, serum glucose 245, calcium 7.6, BNP 410.3, troponin 29 x 2, TSH 1.683, WBC 8.4, hemoglobin 12.8, platelets 206,000.   Portable chest x-ray showed cardiomegaly, small left pleural effusion.   Patient was given IV magnesium 2 g, oral K 40 mEq, IV K10 X 6.  The hospitalist service was consulted to admit for further evaluation and management."   Subjective on day of discharge: Patient's potassium and magnesium were aggressively  replaced.  She will discharge home with oral potassium to take along with her home Lasix.  Patient was feeling well and wanted to discharge home with close outpatient follow-up.  Discharge Diagnoses:   Principal Problem:   Hypokalemia Active Problems:   Essential hypertension   Type II diabetes mellitus (HCC)   High cholesterol   COPD (chronic obstructive pulmonary disease) (HCC)   Paroxysmal atrial fibrillation (HCC)   Chronic heart failure with preserved ejection fraction (HFpEF) (HCC)   Atrial ectopy   Hypomagnesemia   Hypocalcemia     Discharge Instructions  Discharge Instructions     (HEART FAILURE PATIENTS) Call MD:  Anytime you have any of the following symptoms: 1) 3 pound weight gain in 24 hours or 5 pounds in 1 week 2) shortness of breath, with or without a dry hacking cough 3) swelling in the hands, feet or stomach 4) if you have to sleep on extra pillows at night in order to breathe.   Complete by: As directed    Call MD for:  difficulty breathing, headache or visual disturbances   Complete by: As directed    Call MD for:  extreme fatigue   Complete by: As directed    Call MD for:  persistant dizziness or light-headedness   Complete by: As directed    Call MD for:  persistant nausea and vomiting   Complete by: As directed    Call MD for:  severe uncontrolled pain   Complete by: As directed    Call MD for:  temperature >100.4  Complete by: As directed    Diet - low sodium heart healthy   Complete by: As directed    Discharge instructions   Complete by: As directed    You were cared for by a hospitalist during your hospital stay. If you have any questions about your discharge medications or the care you received while you were in the hospital after you are discharged, you can call the unit and ask to speak with the hospitalist on call if the hospitalist that took care of you is not available. Once you are discharged, your primary care physician will handle any  further medical issues. Please note that NO REFILLS for any discharge medications will be authorized once you are discharged, as it is imperative that you return to your primary care physician (or establish a relationship with a primary care physician if you do not have one) for your aftercare needs so that they can reassess your need for medications and monitor your lab values.   Increase activity slowly   Complete by: As directed       Allergies as of 02/03/2023       Reactions   Benztropine Other (See Comments)   Caused word salad Other Reaction(s): Mental Status Changes   Coconut (cocos Nucifera) Diarrhea   Lactose Intolerance (gi) Diarrhea   Lisinopril Swelling   Dc 06-2015, had lip swelling, stomatitis   Other Other (See Comments)   Steroid given in hospital mid June 2023 cause purple rash that crept up arm - per MD  "Etiology of her rash was uncertain.  Rash was resolved at the time of discharge with Benadryl and cortisone cream"   Penicillins Swelling   Facial swelling   Xanax [alprazolam] Other (See Comments)   seizure   Monistat 3 Combo Pack App  [miconazole Nitrate] Itching, Rash        Medication List     TAKE these medications    albuterol 108 (90 Base) MCG/ACT inhaler Commonly known as: VENTOLIN HFA Inhale 2 puffs into the lungs every 6 (six) hours as needed for wheezing or shortness of breath.   amLODipine 10 MG tablet Commonly known as: NORVASC Take 1 tablet (10 mg total) by mouth daily.   apixaban 5 MG Tabs tablet Commonly known as: Eliquis Take 1 tablet (5 mg total) by mouth 2 (two) times daily.   atorvastatin 40 MG tablet Commonly known as: LIPITOR Take 1 tablet (40 mg total) by mouth at bedtime.   Breo Ellipta 100-25 MCG/ACT Aepb Generic drug: fluticasone furoate-vilanterol Inhale 1 puff into the lungs daily.   clonazePAM 1 MG tablet Commonly known as: KLONOPIN Take 1 mg by mouth 2 (two) times daily.  rx by psychiatry   dapagliflozin  propanediol 10 MG Tabs tablet Commonly known as: FARXIGA Take 1 tablet (10 mg total) by mouth daily.   divalproex 250 MG DR tablet Commonly known as: DEPAKOTE Take 250-500 mg by mouth See admin instructions. Take one tablet (250 mg) by mouth every morning and  (500 mg) at night   DULoxetine 60 MG capsule Commonly known as: CYMBALTA Take 60 mg by mouth every morning.   furosemide 80 MG tablet Commonly known as: LASIX Take 0.5 tablets (40 mg total) by mouth daily.   glimepiride 2 MG tablet Commonly known as: AMARYL Take 1 tablet by mouth daily before breakfast.   ipratropium-albuterol 0.5-2.5 (3) MG/3ML Soln Commonly known as: DUONEB Take 3 mLs by nebulization 3 (three) times daily as needed. What changed: reasons to  take this   metoprolol tartrate 50 MG tablet Commonly known as: LOPRESSOR Take 1 tablet (50 mg total) by mouth 2 (two) times daily.   oxymetazoline 0.05 % nasal spray Commonly known as: AFRIN Place 1 spray into both nostrils 2 (two) times daily as needed for congestion (nose bleed).   potassium chloride SA 20 MEQ tablet Commonly known as: KLOR-CON M Take 1 tablet (20 mEq total) by mouth daily. Take with lasix   triamcinolone lotion 0.1 % Commonly known as: KENALOG Apply 1 Application topically 3 (three) times daily. What changed:  when to take this reasons to take this        Follow-up Information     [name], MD. Schedule an appointment as soon as possible for a visit in 1 week(s).   Specialty: Internal Medicine Why: Repeat lab work in 1 week to check your potassium level. Contact information: [address] [place] [address] [phone]                Allergies  Allergen Reactions   Benztropine Other (See Comments)    Caused word salad  Other Reaction(s): Mental Status Changes   Coconut (Cocos Nucifera) Diarrhea   Lactose Intolerance (Gi) Diarrhea   Lisinopril Swelling    Dc 06-2015, had lip swelling,  stomatitis   Other Other (See Comments)    Steroid given in hospital mid June 2023 cause purple rash that crept up arm - per MD  "Etiology of her rash was uncertain.  Rash was resolved at the time of discharge with Benadryl and cortisone cream"   Penicillins Swelling    Facial swelling   Xanax [Alprazolam] Other (See Comments)    seizure   Monistat 3 Combo Pack App  [Miconazole Nitrate] Itching and Rash      Procedures/Studies: DG Chest Port 1 View  Result Date: 02/02/2023 CLINICAL DATA:  cp EXAM: PORTABLE CHEST 1 VIEW COMPARISON:  CXR 09/14/22 FINDINGS: Unchanged enlarged cardiac and mediastinal contours. Likely small left pleural effusion. No pneumothorax. No new focal airspace opacity. There are prominent bilateral interstitial opacities that could represent pulmonary venous congestion or atypical infection. No radiographically apparent displaced rib fracture. Visualized upper abdomen is unremarkable IMPRESSION: Cardiomegaly, small left pleural effusion, and likely mild pulmonary edema versus pulmonary venous congestion Electronically Signed   By: Lorenza Cambridge M.D.   On: 02/02/2023 10:33   DG Hand Complete Left  Result Date: 01/18/2023 CLINICAL DATA:  Larey Seat 1 week ago with persistent pain. EXAM: LEFT HAND - COMPLETE 3+ VIEW COMPARISON:  None Available. FINDINGS: No evidence of fracture or dislocation. No significant degenerative change of the fingers. Some degenerative change in the carpal articulations on the radial side. IMPRESSION: No acute or traumatic finding. Some degenerative change in the carpal articulations on the radial side. Electronically Signed   By: Paulina Fusi M.D.   On: 01/18/2023 10:27   DG Wrist 2 Views Left  Result Date: 01/18/2023 CLINICAL DATA:  Larey Seat 1 week ago with persistent pain. EXAM: LEFT WRIST - 2 VIEW COMPARISON:  None Available. FINDINGS: No acute or traumatic finding. Chronic negative ulnar variance. Some degenerative change at the radial side carpal  articulations. IMPRESSION: No acute or traumatic finding. Chronic negative ulnar variance. Some degenerative change at the radial side carpal articulations. Electronically Signed   By: Paulina Fusi M.D.   On: 01/18/2023 10:25   DG Forearm Left  Result Date: 01/18/2023 CLINICAL DATA:  Larey Seat 1 week ago with persistent  pain EXAM: LEFT FOREARM - 2 VIEW COMPARISON:  None Available. FINDINGS: No traumatic forearm finding. The patient has negative ulnar variance incidentally noted. IMPRESSION: No traumatic finding. Negative ulnar variance. Electronically Signed   By: Paulina Fusi M.D.   On: 01/18/2023 10:24       Discharge Exam: Vitals:   02/03/23 0735 02/03/23 1118  BP: (!) 153/61 (!) 179/79  Pulse: 65 60  Resp: 18 18  Temp: 98 F (36.7 C) 98 F (36.7 C)  SpO2: 92% 90%    General: Pt is alert, awake, not in acute distress Cardiovascular: RRR, S1/S2 +, no edema Respiratory: CTA bilaterally, no wheezing, no rhonchi, no respiratory distress, no conversational dyspnea  Abdominal: Soft, NT, ND, bowel sounds + Extremities: no edema, no cyanosis Psych: Normal mood and affect, stable judgement and insight     The results of significant diagnostics from this hospitalization (including imaging, microbiology, ancillary and laboratory) are listed below for reference.     Microbiology: No results found for this or any previous visit (from the past 240 hour(s)).   Labs: BNP (last 3 results) Recent Labs    02/02/23 0948  BNP 410.3*   Basic Metabolic Panel: Recent Labs  Lab 02/02/23 0948 02/02/23 1254 02/02/23 2155 02/03/23 0049 02/03/23 0050 02/03/23 1104  NA 134* 134* 132*  --  132* 137  K 1.8* 2.1* 2.4*  --  2.2* 3.4*  CL 86* 88* 92*  --  93* 95*  CO2 33* 34* 32  --  30 30  GLUCOSE 245* 254* 419*  --  317* 287*  BUN 20 19 18   --  23 20  CREATININE 1.03* 1.02* 1.51*  --  1.34* 1.08*  CALCIUM 7.6* 7.4* 7.7*  --  8.0* 8.5*  MG 1.6*  --  2.1 2.1  --   --    Liver Function  Tests: Recent Labs  Lab 02/02/23 2155  AST 21  ALT 14  ALKPHOS 101  BILITOT 0.5  PROT 6.8  ALBUMIN 2.8*   No results for input(s): "LIPASE", "AMYLASE" in the last 168 hours. No results for input(s): "AMMONIA" in the last 168 hours. CBC: Recent Labs  Lab 02/02/23 0948 02/03/23 0049  WBC 8.4 7.2  NEUTROABS 5.7  --   HGB 12.8 10.6*  HCT 37.9 32.5*  MCV 87.9 90.5  PLT 206 179   Cardiac Enzymes: No results for input(s): "CKTOTAL", "CKMB", "CKMBINDEX", "TROPONINI" in the last 168 hours. BNP: Invalid input(s): "POCBNP" CBG: Recent Labs  Lab 02/02/23 2111 02/03/23 0628 02/03/23 1116  GLUCAP 369* 221* 268*   D-Dimer No results for input(s): "DDIMER" in the last 72 hours. Hgb A1c No results for input(s): "HGBA1C" in the last 72 hours. Lipid Profile No results for input(s): "CHOL", "HDL", "LDLCALC", "TRIG", "CHOLHDL", "LDLDIRECT" in the last 72 hours. Thyroid function studies Recent Labs    02/02/23 0948  TSH 1.683   Anemia work up No results for input(s): "VITAMINB12", "FOLATE", "FERRITIN", "TIBC", "IRON", "RETICCTPCT" in the last 72 hours. Urinalysis    Component Value Date/Time   COLORURINE YELLOW 02/09/2022 1600   APPEARANCEUR Cloudy (A) 02/09/2022 1600   LABSPEC <=1.005 (A) 02/09/2022 1600   PHURINE 6.5 02/09/2022 1600   GLUCOSEU >=1000 (A) 02/09/2022 1600   HGBUR SMALL (A) 02/09/2022 1600   BILIRUBINUR NEGATIVE 02/09/2022 1600   BILIRUBINUR Negative 02/09/2022 1326   KETONESUR NEGATIVE 02/09/2022 1600   PROTEINUR Negative 02/09/2022 1326   PROTEINUR 30 (A) 01/25/2022 1259   UROBILINOGEN 0.2 02/09/2022 1600  UROBILINOGEN 0.2 02/09/2022 1326   NITRITE NEGATIVE 02/09/2022 1600   NITRITE Negative 02/09/2022 1326   LEUKOCYTESUR MODERATE (A) 02/09/2022 1600   LEUKOCYTESUR Negative 02/09/2022 1326   Sepsis Labs Recent Labs  Lab 02/02/23 0948 02/03/23 0049  WBC 8.4 7.2   Microbiology No results found for this or any previous visit (from the past 240  hour(s)).   Patient was seen and examined on the day of discharge and was found to be in stable condition. Time coordinating discharge: 35 minutes including assessment and coordination of care, as well as examination of the patient.   SIGNED:  Noralee Stain, DO [facility] 02/03/2023, 1:48 PM

## 2023-02-03 NOTE — TOC Transition Note (Signed)
Transition of Care [facility]) - CM/SW Discharge Note   Patient Details  Name: Adrienne Bennett MRN: [id number] Date of Birth: 1960-12-11  [facility]) CM/SW Contact:  Leone Haven, RN Phone Number: 02/03/2023, 2:22 PM   Clinical Narrative:    For dc today, spouse at bedside will transport her home.     Final next level of care: Home/Self Care Barriers to Discharge: No Barriers Identified   Patient Goals and CMS Choice   Choice offered to / list presented to : NA  Discharge Placement                         Discharge Plan and Services Additional resources added to the After Visit Summary for   In-house Referral: NA Discharge Planning Services: CM Consult Post Acute Care Choice: NA          DME Arranged: N/A DME Agency: NA       HH Arranged: NA          Social Determinants of Health (SDOH) Interventions SDOH Screenings   Food Insecurity: No Food Insecurity (02/02/2023)  Housing: Low Risk  (02/02/2023)  Transportation Needs: No Transportation Needs (02/02/2023)  Utilities: Not At Risk (02/02/2023)  Alcohol Screen: Low Risk  (02/08/2018)  Depression (PHQ2-9): Medium Risk (12/14/2022)  Financial Resource Strain: Low Risk  (12/09/2022)  Physical Activity: Sufficiently Active (12/09/2022)  Social Connections: Socially Isolated (12/09/2022)  Stress: No Stress Concern Present (12/09/2022)  Tobacco Use: Low Risk  (02/02/2023)     Readmission Risk Interventions     No data to display

## 2023-02-03 NOTE — TOC Initial Note (Signed)
Transition of Care [facility]) - Initial/Assessment Note    Patient Details  Name: Adrienne Bennett MRN: [id number] Date of Birth: 03-15-1961  Transition of Care [facility]) CM/SW Contact:    Leone Haven, RN Phone Number: 02/03/2023, 2:20 PM  Clinical Narrative:                 From home with spouse, indep, PCP Dr. [name], she has insurance on file, she currently does not have any HH services in place.  She has home oxygen with [employer] 2 liters that she uses at night per patient and spouse.  Her spouse is her support sytem, he will transport her home at dc.  She gets meds from [facility] on [address] in  [place] and also thur [employer].    Expected Discharge Plan: Home/Self Care Barriers to Discharge: No Barriers Identified   Patient Goals and CMS Choice Patient states their goals for this hospitalization and ongoing recovery are:: return homw ith spouse   Choice offered to / list presented to : NA      Expected Discharge Plan and Services In-house Referral: NA Discharge Planning Services: CM Consult Post Acute Care Choice: NA Living arrangements for the past 2 months: Single Family Home Expected Discharge Date: 02/03/23               DME Arranged: N/A DME Agency: NA       HH Arranged: NA          Prior Living Arrangements/Services Living arrangements for the past 2 months: Single Family Home Lives with:: Spouse Patient language and need for interpreter reviewed:: Yes Do you feel safe going back to the place where you live?: Yes      Need for Family Participation in Patient Care: Yes (Comment) Care giver support system in place?: Yes (comment) Current home services: DME (walker, cane, oxyen 2 liters at night with [employer]) Criminal Activity/Legal Involvement Pertinent to Current Situation/Hospitalization: No - Comment as needed  Activities of Daily Living Home Assistive Devices/Equipment: None ADL Screening (condition at time of admission) Patient's cognitive ability  adequate to safely complete daily activities?: Yes Is the patient deaf or have difficulty hearing?: No Does the patient have difficulty seeing, even when wearing glasses/contacts?: No Does the patient have difficulty concentrating, remembering, or making decisions?: No Patient able to express need for assistance with ADLs?: Yes Does the patient have difficulty dressing or bathing?: No Independently performs ADLs?: Yes (appropriate for developmental age) Does the patient have difficulty walking or climbing stairs?: No Weakness of Legs: Both Weakness of Arms/Hands: Both  Permission Sought/Granted Permission sought to share information with : Case Manager Permission granted to share information with : Yes, Verbal Permission Granted              Emotional Assessment Appearance:: Appears stated age Attitude/Demeanor/Rapport: Engaged Affect (typically observed): Appropriate Orientation: : Oriented to Self, Oriented to Place, Oriented to  Time, Oriented to Situation Alcohol / Substance Use: Not Applicable Psych Involvement: No (comment)  Admission diagnosis:  Palpitations [R00.2] Hypokalemia [E87.6] Hypomagnesemia [E83.42] PVC's (premature ventricular contractions) [I49.3] Chest tightness [R07.89] Atrial ectopy [I49.1] Atrial fibrillation with rapid ventricular response (HCC) [I48.91] Patient Active Problem List   Diagnosis Date Noted   Atrial ectopy 02/02/2023   Hypokalemia 02/02/2023   Hypomagnesemia 02/02/2023   Hypocalcemia 02/02/2023   Physical debility 08/03/2022   Chronic heart failure with preserved ejection fraction (HFpEF) (HCC) 08/03/2022   Exocrine pancreatic insufficiency 04/04/2022   Paroxysmal atrial fibrillation (HCC) 04/01/2022  Full incontinence of feces 02/15/2022   Chronic diarrhea 02/15/2022   Irritable bowel syndrome with diarrhea 02/15/2022   Nausea 02/15/2022   History of colonic polyps 02/15/2022   Chronic anticoagulation 02/15/2022   Change in  bowel habits 02/15/2022   On home oxygen therapy 02/15/2022   Acute on chronic diastolic CHF (congestive heart failure) (HCC) 01/23/2022   Chronic respiratory failure (HCC) 01/23/2022   CKD (chronic kidney disease) stage 3, GFR 30-59 ml/min (HCC) 01/23/2022   Acute respiratory failure with hypoxia (HCC) 01/10/2022   AKI (acute kidney injury) (HCC) 01/09/2022   COPD (chronic obstructive pulmonary disease) (HCC) 01/08/2022   Dizziness 04/08/2021   Combined forms of age-related cataract of left eye 04/01/2021   High cholesterol    Congestive heart failure (HCC) 12/03/2020   Osteoarthritis    IBS (irritable bowel syndrome)    Depression    Macular degeneration, dry 02/01/2020   Diabetic retinopathy (HCC) 03/31/2018   Subclinical hyperthyroidism 02/26/2018   GERD (gastroesophageal reflux disease) 05/23/2017   DJD (degenerative joint disease) 03/04/2016   PCP NOTES >>>>>>>>>> 12/02/2015   Thyromegaly 12/04/2014   Bipolar affective disorder (HCC)    Annual physical exam 07/13/2012   Neck pain 02/03/2011   Essential hypertension 09/04/2010   GANGLION CYST 07/22/2010   MIGRAINE HEADACHE 03/13/2010   DM (diabetes mellitus), secondary uncontrolled (HCC) 07/16/2008   Type II diabetes mellitus (HCC) 2007   PCP:  [name], MD Pharmacy:   [facility] [address] [place], [place] - [address] [place] [address] Phone: [phone] Fax: [phone]  [facility] - [place], [place] - [address] [place] [address] Phone: [phone] Fax: [phone]     Social Determinants of Health (SDOH) Social History: SDOH Screenings   Food Insecurity: No Food Insecurity (02/02/2023)  Housing: Low Risk  (02/02/2023)  Transportation Needs: No Transportation Needs (02/02/2023)  Utilities: Not At Risk (02/02/2023)  Alcohol Screen: Low Risk  (02/08/2018)  Depression (PHQ2-9): Medium Risk (12/14/2022)  Financial  Resource Strain: Low Risk  (12/09/2022)  Physical Activity: Sufficiently Active (12/09/2022)  Social Connections: Socially Isolated (12/09/2022)  Stress: No Stress Concern Present (12/09/2022)  Tobacco Use: Low Risk  (02/02/2023)   SDOH Interventions:     Readmission Risk Interventions     No data to display

## 2023-02-05 ENCOUNTER — Other Ambulatory Visit: Payer: Self-pay | Admitting: Internal Medicine

## 2023-02-10 DIAGNOSIS — J441 Chronic obstructive pulmonary disease with (acute) exacerbation: Secondary | ICD-10-CM | POA: Diagnosis not present

## 2023-02-10 DIAGNOSIS — I10 Essential (primary) hypertension: Secondary | ICD-10-CM | POA: Diagnosis not present

## 2023-02-10 DIAGNOSIS — I5020 Unspecified systolic (congestive) heart failure: Secondary | ICD-10-CM | POA: Diagnosis not present

## 2023-02-12 ENCOUNTER — Ambulatory Visit (INDEPENDENT_AMBULATORY_CARE_PROVIDER_SITE_OTHER): Payer: Medicare Other | Admitting: Internal Medicine

## 2023-02-12 VITALS — BP 136/80 | HR 37 | Temp 98.1°F | Resp 18 | Ht 61.0 in | Wt 136.2 lb

## 2023-02-12 DIAGNOSIS — I4891 Unspecified atrial fibrillation: Secondary | ICD-10-CM

## 2023-02-12 DIAGNOSIS — E876 Hypokalemia: Secondary | ICD-10-CM

## 2023-02-12 DIAGNOSIS — I5032 Chronic diastolic (congestive) heart failure: Secondary | ICD-10-CM

## 2023-02-12 LAB — CBC WITH DIFFERENTIAL/PLATELET
Basophils Absolute: 43 cells/uL (ref 0–200)
HCT: 37.9 % (ref 35.0–45.0)
Hemoglobin: 12.4 g/dL (ref 11.7–15.5)
Lymphs Abs: 2074 cells/uL (ref 850–3900)
MCH: 29.8 pg (ref 27.0–33.0)
Neutro Abs: 5576 cells/uL (ref 1500–7800)
Neutrophils Relative %: 65.6 %
Platelets: 238 10*3/uL (ref 140–400)
RBC: 4.16 10*6/uL (ref 3.80–5.10)

## 2023-02-12 NOTE — Patient Instructions (Addendum)
Check the  blood pressure regularly BP GOAL is between 110/65 and  135/85. If it is consistently higher or lower, let me know   GO TO THE LAB : Get the blood work    

## 2023-02-12 NOTE — Assessment & Plan Note (Signed)
Hypokalemia ,mild hypomagnesemia Admitted w/ profound low K+, no recent change in lasix dose  (40 mg every day), was not on K+ supplements b/c hyperkalemia was noted 08/2022, at the time lasix dose reduced Plan: recheck labs, cont KCL 20 meq every day, further advise w/ results  A Fibrillation , paroxysmal: bradycardic, not in distress, no diaphoresis, well perfused , did c/o fatigue. EKG:not available today, computers down, will call pt next week for EKG. Bradycardia concerns me but she is well perfused  HTN: Recommend ambulatory BPs otherwise continue Metoprolol, amlodipine, Lasix and potassium. CHF: seems to be doing well w/ normal volume status Anemia: las Hg low, recheck ; denies GI sxs RTC as schedule 03-2023

## 2023-02-12 NOTE — Progress Notes (Signed)
Subjective:    Patient ID: Adrienne Bennett, female    DOB: 08-22-1960, 62 y.o.   MRN: [id number]  DOS:  02/12/2023 Type of visit - description: f/u  Admitted w/ palpitations , some SOB, chest tightness. BP was elevated PTA, in the 170s  Labs showed sodium 134, potassium 1.8, magnesium 1.6, chloride 86, bicarb 33, BUN 20, creatinine 1.03, serum glucose 245, calcium 7.6, BNP 410.3, troponin 29 x 2, TSH 1.683, WBC 8.4, hemoglobin 12.8, platelets 206,000.   Was d/c home after electrolytes were replaced.  Since then has not check her BP. No CP-DOE-edema Does feels exhausted and after walking has some leg pain bilaterally.  Review of Systems See above   Past Medical History:  Diagnosis Date   Bipolar affective disorder (HCC)    PTSD, agoraphobiam ,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    GANGLION CYST 07/22/2010   Qualifier: Diagnosis of  By: [name] MD, Vincent Streater E.    GERD (gastroesophageal reflux disease) 05/23/2017   Hyperlipidemia 10/12/2012   IBS (irritable bowel syndrome)    chronic Diarrhea   Macular degeneration, dry    MIGRAINE HEADACHE 03/13/2010   Qualifier: Diagnosis of  By: [name] MD, [name].    Neck pain 02/03/2011   Osteoarthritis    "back; goes down my right leg" (09/01/2013)   Subclinical hyperthyroidism 02/26/2018   Thyromegaly 12/04/2014   Type II diabetes mellitus (HCC) 2007    Past Surgical History:  Procedure Laterality Date   CARPAL TUNNEL RELEASE Bilateral ~ 2000   cataract surgery Bilateral    TONSILLECTOMY  07/27/1968   TUBAL LIGATION  07/27/1994    Current Outpatient Medications  Medication Instructions   albuterol (VENTOLIN HFA) 108 (90 Base) MCG/ACT  inhaler 2 puffs, Inhalation, Every 6 hours PRN   amLODipine (NORVASC) 10 mg, Oral, Daily   apixaban (ELIQUIS) 5 mg, Oral, 2 times daily   atorvastatin (LIPITOR) 40 mg, Oral, Daily at bedtime   BREO ELLIPTA 100-25 MCG/ACT AEPB 1 puff, Inhalation, Daily   clonazePAM (KLONOPIN) 1 mg, Oral, 2 times daily,  rx by psychiatry   dapagliflozin propanediol (FARXIGA) 10 mg, Oral, Daily   divalproex (DEPAKOTE) 250-500 mg, Oral, See admin instructions, Take one tablet (250 mg) by mouth every morning and  (500 mg) at night   DULoxetine (CYMBALTA) 60 mg, Oral, Every morning   furosemide (LASIX) 40 mg, Oral, Daily   glimepiride (AMARYL) 2 MG tablet 1 tablet, Oral, Daily before breakfast   ipratropium-albuterol (DUONEB) 0.5-2.5 (3) MG/3ML SOLN 3 mLs, Nebulization, 3 times daily PRN   metoprolol tartrate (LOPRESSOR) 50 mg, Oral, 2 times daily   oxymetazoline (AFRIN) 0.05 % nasal spray 1 spray, Each Nare, 2 times daily PRN   potassium chloride SA (KLOR-CON M) 20 MEQ tablet 20 mEq, Oral, Daily, Take with lasix   triamcinolone lotion (KENALOG) 0.1 % 1 Application, Topical, 3 times daily       Objective:   Physical Exam BP 136/80 (BP Location: Left Arm, Patient Position: Sitting, Cuff Size: Normal)   Pulse (!) 37   Temp 98.1 F (36.7 C) (Oral)   Resp 18   Ht 5\' 1"  (1.549 m)   Wt 136 lb 3.2 oz (  61.8 kg)   LMP 08/24/2015 (Exact Date)   SpO2 94%   BMI 25.73 kg/m  General:   Well developed, NAD, BMI noted. HEENT:  Normocephalic . Face symmetric, atraumatic Lungs:  CTA B Normal respiratory effort, no intercostal retractions, no accessory muscle use. Heart: irregularly irregular  Pulse- bradycardic .  Lower extremities: no pretibial edema bilaterally ; good pedal pulses  Skin: Not pale. Not jaundice Neurologic:  alert & oriented X3.  Speech normal, gait appropriate for age and unassisted Psych--  Cognition and judgment appear intact.  Cooperative with normal attention span and concentration.   Behavior appropriate. No anxious or depressed appearing.      Assessment     Assessment ENDO: started to see Dr Morrison Old 12/2017 DM  HTN -- dc lisinopril 06-2015>> lips swell x1 , also had stomatitis >> resolved  High cholesterol GI: ---IBS (diarrhea, unable to control BMs) ---Fatty Liver per CT 04-2020 Thyroid disease -Multinodular goiter, thyromegaly w/ dominant nodule: Negative BX 01-2015 - Hyperthyroidism, subclinical, DX 2019 --s/p thyroid ablation 2019 ? IBS.Chronic diarrhea DJD Psychiatry: Sees Dr. Donell Beers Depression, bipolar, PTSD, agoraphobiam ,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),  recovered self-mutilator, hoarder  CV:  CHF w/ preserved EF dx 11-2020 via stress test-echo O2 started after admission June 2023 for Resp Failure ( d/t CHF-COPD??) P- Afib dx ~ June 2023  COPD: dx ~ June  2023    PLAN  Hypokalemia ,mild hypomagnesemia Admitted w/ profound low K+, no recent change in lasix dose  (40 mg every day), was not on K+ supplements b/c hyperkalemia was noted 08/2022, at the time lasix dose reduced Plan: recheck labs, cont KCL 20 meq every day, further advise w/ results  A Fibrillation , paroxysmal: bradycardic, not in distress, no diaphoresis, well perfused , did c/o fatigue. EKG:not available today, computers down, will call pt next week for EKG. Bradycardia concerns me but she is well perfused  HTN: Recommend ambulatory BPs otherwise continue Metoprolol, amlodipine, Lasix and potassium. CHF: seems to be doing well w/ normal volume status Anemia: las Hg low, recheck ; denies GI sxs RTC as schedule 03-2023

## 2023-02-13 LAB — BASIC METABOLIC PANEL
BUN/Creatinine Ratio: 17 (calc) (ref 6–22)
BUN: 20 mg/dL (ref 7–25)
CO2: 27 mmol/L (ref 20–32)
Calcium: 8.6 mg/dL (ref 8.6–10.4)
Chloride: 94 mmol/L — ABNORMAL LOW (ref 98–110)
Creat: 1.16 mg/dL — ABNORMAL HIGH (ref 0.50–1.05)
Glucose, Bld: 385 mg/dL — ABNORMAL HIGH (ref 65–99)
Potassium: 4 mmol/L (ref 3.5–5.3)
Sodium: 138 mmol/L (ref 135–146)

## 2023-02-13 LAB — CBC WITH DIFFERENTIAL/PLATELET
Absolute Monocytes: 638 cells/uL (ref 200–950)
Basophils Relative: 0.5 %
Eosinophils Absolute: 170 cells/uL (ref 15–500)
Eosinophils Relative: 2 %
MCHC: 32.7 g/dL (ref 32.0–36.0)
MCV: 91.1 fL (ref 80.0–100.0)
MPV: 11.6 fL (ref 7.5–12.5)
Monocytes Relative: 7.5 %
RDW: 13.9 % (ref 11.0–15.0)
Total Lymphocyte: 24.4 %
WBC: 8.5 10*3/uL (ref 3.8–10.8)

## 2023-02-17 DIAGNOSIS — E113293 Type 2 diabetes mellitus with mild nonproliferative diabetic retinopathy without macular edema, bilateral: Secondary | ICD-10-CM | POA: Diagnosis not present

## 2023-02-17 DIAGNOSIS — E042 Nontoxic multinodular goiter: Secondary | ICD-10-CM | POA: Diagnosis not present

## 2023-02-17 DIAGNOSIS — Z794 Long term (current) use of insulin: Secondary | ICD-10-CM | POA: Diagnosis not present

## 2023-02-17 LAB — HEMOGLOBIN A1C: Hemoglobin A1C: 10.5

## 2023-02-18 DIAGNOSIS — E113293 Type 2 diabetes mellitus with mild nonproliferative diabetic retinopathy without macular edema, bilateral: Secondary | ICD-10-CM | POA: Diagnosis not present

## 2023-03-09 ENCOUNTER — Other Ambulatory Visit: Payer: Self-pay | Admitting: Internal Medicine

## 2023-03-11 ENCOUNTER — Encounter (INDEPENDENT_AMBULATORY_CARE_PROVIDER_SITE_OTHER): Payer: Self-pay

## 2023-03-13 ENCOUNTER — Other Ambulatory Visit: Payer: Self-pay | Admitting: Internal Medicine

## 2023-03-13 DIAGNOSIS — I10 Essential (primary) hypertension: Secondary | ICD-10-CM | POA: Diagnosis not present

## 2023-03-13 DIAGNOSIS — J441 Chronic obstructive pulmonary disease with (acute) exacerbation: Secondary | ICD-10-CM | POA: Diagnosis not present

## 2023-03-13 DIAGNOSIS — I5020 Unspecified systolic (congestive) heart failure: Secondary | ICD-10-CM | POA: Diagnosis not present

## 2023-03-15 ENCOUNTER — Encounter: Payer: Self-pay | Admitting: Internal Medicine

## 2023-03-15 NOTE — Telephone Encounter (Signed)
RF per endocrinology

## 2023-03-15 NOTE — Telephone Encounter (Signed)
Okay to refill Adrienne Bennett- or should I defer to endo?

## 2023-03-16 ENCOUNTER — Telehealth: Payer: Self-pay | Admitting: Internal Medicine

## 2023-03-16 NOTE — Telephone Encounter (Signed)
Please advise 

## 2023-03-16 NOTE — Telephone Encounter (Signed)
Endo is in charge of her diabetes and it should be refilled by them.  Recommend to reach out to Endo

## 2023-03-16 NOTE — Telephone Encounter (Signed)
Pt called to follow up on Farxiga refill. Advised that per note from [place] on 03/15/23, it should be deferred to endocrinology. Pt said that her endocrinologist has never filled it and [facility] name is on the bottle. Please call pt to advise.

## 2023-03-16 NOTE — Telephone Encounter (Signed)
Spoke w/ Pt- informed to contact endo. Pt verbalized understanding.

## 2023-03-21 DIAGNOSIS — E113293 Type 2 diabetes mellitus with mild nonproliferative diabetic retinopathy without macular edema, bilateral: Secondary | ICD-10-CM | POA: Diagnosis not present

## 2023-03-22 ENCOUNTER — Ambulatory Visit: Payer: Medicare Other | Admitting: Obstetrics and Gynecology

## 2023-03-22 ENCOUNTER — Other Ambulatory Visit (HOSPITAL_COMMUNITY)
Admission: RE | Admit: 2023-03-22 | Discharge: 2023-03-22 | Disposition: A | Discharge status: Home / Self Care | Payer: Medicare Other | Source: Ambulatory Visit | Attending: Obstetrics and Gynecology | Admitting: Obstetrics and Gynecology

## 2023-03-22 ENCOUNTER — Encounter: Payer: Self-pay | Admitting: Obstetrics and Gynecology

## 2023-03-22 VITALS — BP 124/78 | HR 88 | Ht 64.0 in | Wt 136.0 lb

## 2023-03-22 DIAGNOSIS — Z01419 Encounter for gynecological examination (general) (routine) without abnormal findings: Secondary | ICD-10-CM | POA: Insufficient documentation

## 2023-03-22 DIAGNOSIS — Z1151 Encounter for screening for human papillomavirus (HPV): Secondary | ICD-10-CM | POA: Diagnosis not present

## 2023-03-22 DIAGNOSIS — R6882 Decreased libido: Secondary | ICD-10-CM

## 2023-03-22 DIAGNOSIS — N958 Other specified menopausal and perimenopausal disorders: Secondary | ICD-10-CM

## 2023-03-22 MED ORDER — ESTRADIOL 10 MCG VA TABS: ORAL_TABLET | VAGINAL | 1 refills | Status: AC

## 2023-03-22 NOTE — Patient Instructions (Signed)
The Menopause Manifesto - book about menopause written by a gynecologist  If the vaginal estrogen is too expensive, let us know. We can try different formulations.   Apply a thin layer of vaseline over the skin on your vulva every night to provide a protective barrier
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
## 2023-03-24 LAB — CYTOLOGY - PAP
Comment: NEGATIVE
Diagnosis: NEGATIVE
High risk HPV: NEGATIVE

## 2023-03-25 ENCOUNTER — Inpatient Hospital Stay (HOSPITAL_BASED_OUTPATIENT_CLINIC_OR_DEPARTMENT_OTHER): Admission: RE | Admit: 2023-03-25 | Payer: Medicare Other | Source: Ambulatory Visit

## 2023-03-25 DIAGNOSIS — E113293 Type 2 diabetes mellitus with mild nonproliferative diabetic retinopathy without macular edema, bilateral: Secondary | ICD-10-CM | POA: Diagnosis not present

## 2023-03-25 DIAGNOSIS — Z794 Long term (current) use of insulin: Secondary | ICD-10-CM | POA: Diagnosis not present

## 2023-03-25 DIAGNOSIS — E042 Nontoxic multinodular goiter: Secondary | ICD-10-CM | POA: Diagnosis not present

## 2023-03-27 ENCOUNTER — Other Ambulatory Visit: Payer: Self-pay | Admitting: Internal Medicine

## 2023-03-30 ENCOUNTER — Encounter: Payer: Self-pay | Admitting: Internal Medicine

## 2023-03-30 ENCOUNTER — Encounter (HOSPITAL_BASED_OUTPATIENT_CLINIC_OR_DEPARTMENT_OTHER): Payer: Self-pay

## 2023-03-30 ENCOUNTER — Ambulatory Visit (HOSPITAL_BASED_OUTPATIENT_CLINIC_OR_DEPARTMENT_OTHER)
Admission: RE | Admit: 2023-03-30 | Discharge: 2023-03-30 | Disposition: A | Discharge status: Home / Self Care | Payer: Medicare Other | Source: Ambulatory Visit | Attending: Obstetrics and Gynecology | Admitting: Obstetrics and Gynecology

## 2023-03-30 ENCOUNTER — Ambulatory Visit (INDEPENDENT_AMBULATORY_CARE_PROVIDER_SITE_OTHER): Payer: Medicare Other | Admitting: Internal Medicine

## 2023-03-30 VITALS — BP 126/66 | HR 66 | Temp 97.9°F | Resp 16 | Ht 64.0 in | Wt 138.0 lb

## 2023-03-30 DIAGNOSIS — R490 Dysphonia: Secondary | ICD-10-CM

## 2023-03-30 DIAGNOSIS — J9611 Chronic respiratory failure with hypoxia: Secondary | ICD-10-CM

## 2023-03-30 DIAGNOSIS — R053 Chronic cough: Secondary | ICD-10-CM | POA: Diagnosis not present

## 2023-03-30 DIAGNOSIS — E875 Hyperkalemia: Secondary | ICD-10-CM

## 2023-03-30 DIAGNOSIS — I480 Paroxysmal atrial fibrillation: Secondary | ICD-10-CM | POA: Diagnosis not present

## 2023-03-30 DIAGNOSIS — E010 Iodine-deficiency related diffuse (endemic) goiter: Secondary | ICD-10-CM | POA: Diagnosis not present

## 2023-03-30 DIAGNOSIS — Z01419 Encounter for gynecological examination (general) (routine) without abnormal findings: Secondary | ICD-10-CM | POA: Diagnosis present

## 2023-03-30 DIAGNOSIS — Z0001 Encounter for general adult medical examination with abnormal findings: Secondary | ICD-10-CM

## 2023-03-30 DIAGNOSIS — Z1231 Encounter for screening mammogram for malignant neoplasm of breast: Secondary | ICD-10-CM | POA: Insufficient documentation

## 2023-03-30 DIAGNOSIS — Z0000 Encounter for general adult medical examination without abnormal findings: Secondary | ICD-10-CM

## 2023-03-30 DIAGNOSIS — Z8709 Personal history of other diseases of the respiratory system: Secondary | ICD-10-CM
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
## 2023-03-30 NOTE — Patient Instructions (Addendum)
Vaccines I recommend: Tdap (tetanus) Covid booster-new this fall Flu shot this fall    We are referring you to a pulmonologist   (located in [facility]) to see about your cough and low oxygen    GO TO THE LAB : Get the blood work     GO TO THE FRONT DESK, PLEASE SCHEDULE YOUR APPOINTMENTS Come back for a checkup in 3 to 4 months.  STOP BY THE FIRST FLOOR: Arrange for a thyroid ultrasound    Per our records you are due for your diabetic eye exam. Please contact your eye doctor to schedule an appointment. Please have them send copies of your office visit notes to [place]. Our fax number is [phone]. If you need a referral to an eye doctor please let us know.      "Health Care Power of attorney" ,  "Living will" (Advance care planning documents)  If you already have a living will or healthcare power of attorney, is recommended you bring the copy to be scanned in your chart.   The document will be available to all the doctors you see in the system.  Advance care planning is a process that supports adults in  understanding and sharing their preferences regarding future medical care.  The patient's preferences are recorded in documents called Advance Directives and the can be modified at any time while the patient is in full mental capacity.   If you don't have one, please consider create one.      More information at: [email or web address]

## 2023-03-30 NOTE — Telephone Encounter (Signed)
Appt later today.

## 2023-03-30 NOTE — Assessment & Plan Note (Signed)
Here for CPX DM: Per endocrinology History of hypokalemia: Check BMP HTN: On amlodipine, Lasix, potassium.  BP today is WNL, no change. High cholesterol: On atorvastatin, controlled. Thyroid disease: Thyromegaly on exam, complaint of hoarseness, check a thyroid ultrasound. CHF, paroxysmal A-fib.  Anticoagulated, on metoprolol.  EKG today: Sinus rhythm, heart rate 60.  Continue present care. Respiratory failure:  Carries the dx  of CHF, possibly COPD, chronic cough, chronic hypoxia, O2 sat today 92% but low O2 is not limiting her, denies SOB or DOE. Previous investigations including a CT chest 11/28/2020.  No PFTs done I am aware of. Likes to get rid of her oxygen equipment.  Is okay if so desired. I do recommend at this time  further evaluation by pulmonary, referral sent. RTC 3 months

## 2023-03-30 NOTE — Assessment & Plan Note (Signed)
Here for CPX - Tdap: 2013 - prevnar: 2016; - PNM 23: 2010, 2019; PNM 20 : 2024 - s/p shingrex x 2  - vaccines I recommend: Tdap, flu shot every fall, Covid booster if not done in the last few months  -CCS:  Colonoscopy 07-2010, cscope 11/2022 , next per GI Female care --- Sees gyn, ---MMG schedule for later today  ---bones : DEXA at gyn  (per pt) -Diet and exercise: Counseled + FH CAD:on ASA  - Available labs reviewed: Will get a BMP, vitamin D -Healthcare power of attorney information provided

## 2023-03-31 ENCOUNTER — Telehealth: Payer: Self-pay | Admitting: *Deleted

## 2023-03-31 LAB — BASIC METABOLIC PANEL
BUN: 39 mg/dL — ABNORMAL HIGH (ref 6–23)
CO2: 31 meq/L (ref 19–32)
Calcium: 9 mg/dL (ref 8.4–10.5)
Chloride: 96 meq/L (ref 96–112)
Creatinine, Ser: 1.59 mg/dL — ABNORMAL HIGH (ref 0.40–1.20)
GFR: 34.48 mL/min — ABNORMAL LOW (ref >60.00)
Glucose, Bld: 292 mg/dL — ABNORMAL HIGH (ref 70–99)
Potassium: 5.9 meq/L — ABNORMAL HIGH (ref 3.5–5.1)
Sodium: 133 meq/L — ABNORMAL LOW (ref 135–145)

## 2023-03-31 NOTE — Addendum Note (Signed)
Addended by: Conrad Markesan D on: 03/31/2023 11:50 AM   Modules accepted: Orders

## 2023-03-31 NOTE — Telephone Encounter (Signed)
PCP verbally made aware.  

## 2023-03-31 NOTE — Telephone Encounter (Signed)
CRITICAL VALUE STICKER  CRITICAL VALUE:  K -- 5.9  RECEIVER (on-site recipient of call): Mervin Kung, CMA  DATE & TIME NOTIFIED:  9/4 @ 11:23  MESSENGER (representative from lab):  Saa  MD NOTIFIED: [name]  TIME OF NOTIFICATION:  11:41  RESPONSE:

## 2023-03-31 NOTE — Telephone Encounter (Signed)
See results notes. 

## 2023-04-02 ENCOUNTER — Other Ambulatory Visit (INDEPENDENT_AMBULATORY_CARE_PROVIDER_SITE_OTHER): Payer: Medicare Other

## 2023-04-02 ENCOUNTER — Other Ambulatory Visit: Payer: Self-pay

## 2023-04-02 ENCOUNTER — Ambulatory Visit (HOSPITAL_BASED_OUTPATIENT_CLINIC_OR_DEPARTMENT_OTHER)
Admission: RE | Admit: 2023-04-02 | Discharge: 2023-04-02 | Disposition: A | Discharge status: Home / Self Care | Payer: Medicare Other | Source: Ambulatory Visit | Attending: Internal Medicine | Admitting: Internal Medicine

## 2023-04-02 DIAGNOSIS — E010 Iodine-deficiency related diffuse (endemic) goiter: Secondary | ICD-10-CM | POA: Diagnosis not present

## 2023-04-02 DIAGNOSIS — F3170 Bipolar disorder, currently in remission, most recent episode unspecified: Secondary | ICD-10-CM

## 2023-04-02 DIAGNOSIS — E875 Hyperkalemia: Secondary | ICD-10-CM

## 2023-04-02 DIAGNOSIS — E042 Nontoxic multinodular goiter: Secondary | ICD-10-CM | POA: Diagnosis not present

## 2023-04-02 DIAGNOSIS — R490 Dysphonia: Secondary | ICD-10-CM | POA: Diagnosis not present

## 2023-04-02 LAB — BASIC METABOLIC PANEL
BUN: 36 mg/dL — ABNORMAL HIGH (ref 6–23)
CO2: 32 meq/L (ref 19–32)
Calcium: 9 mg/dL (ref 8.4–10.5)
Chloride: 96 meq/L (ref 96–112)
Creatinine, Ser: 1.39 mg/dL — ABNORMAL HIGH (ref 0.40–1.20)
GFR: 40.52 mL/min — ABNORMAL LOW (ref >60.00)
Glucose, Bld: 177 mg/dL — ABNORMAL HIGH (ref 70–99)
Potassium: 4.8 meq/L (ref 3.5–5.1)
Sodium: 136 meq/L (ref 135–145)

## 2023-04-02 MED ORDER — POTASSIUM CHLORIDE CRYS ER 10 MEQ PO TBCR: 10.0000 meq | EXTENDED_RELEASE_TABLET | Freq: Every day | ORAL | 1 refills | Status: DC

## 2023-04-02 NOTE — Addendum Note (Signed)
Addended byConrad Wanette D on: 04/02/2023 04:33 PM   Modules accepted: Orders

## 2023-04-03 LAB — VALPROIC ACID LEVEL: Valproic Acid Lvl: 43.8 mg/L — ABNORMAL LOW (ref 50.0–100.0)

## 2023-04-17 ENCOUNTER — Other Ambulatory Visit: Payer: Self-pay | Admitting: Internal Medicine

## 2023-04-21 ENCOUNTER — Encounter: Payer: Self-pay | Admitting: Internal Medicine

## 2023-04-28 ENCOUNTER — Other Ambulatory Visit: Payer: Self-pay | Admitting: Internal Medicine

## 2023-05-12 ENCOUNTER — Encounter: Payer: Self-pay | Admitting: Cardiology

## 2023-05-13 DIAGNOSIS — R053 Chronic cough: Secondary | ICD-10-CM | POA: Diagnosis not present

## 2023-05-13 DIAGNOSIS — J984 Other disorders of lung: Secondary | ICD-10-CM | POA: Diagnosis not present

## 2023-05-20 DIAGNOSIS — R053 Chronic cough: Secondary | ICD-10-CM | POA: Diagnosis not present

## 2023-05-24 ENCOUNTER — Other Ambulatory Visit: Payer: Self-pay | Admitting: Internal Medicine

## 2023-05-25 DIAGNOSIS — Z794 Long term (current) use of insulin: Secondary | ICD-10-CM | POA: Diagnosis not present

## 2023-05-25 DIAGNOSIS — E113293 Type 2 diabetes mellitus with mild nonproliferative diabetic retinopathy without macular edema, bilateral: Secondary | ICD-10-CM | POA: Diagnosis not present

## 2023-05-25 LAB — HEMOGLOBIN A1C: Hemoglobin A1C: 9.1

## 2023-06-02 ENCOUNTER — Encounter: Payer: Self-pay | Admitting: Internal Medicine

## 2023-06-08 ENCOUNTER — Other Ambulatory Visit (HOSPITAL_BASED_OUTPATIENT_CLINIC_OR_DEPARTMENT_OTHER): Payer: Self-pay

## 2023-06-08 ENCOUNTER — Encounter: Payer: Self-pay | Admitting: Family Medicine

## 2023-06-08 ENCOUNTER — Ambulatory Visit (INDEPENDENT_AMBULATORY_CARE_PROVIDER_SITE_OTHER): Payer: Medicare Other | Admitting: Family Medicine

## 2023-06-08 VITALS — BP 142/80 | HR 70 | Temp 97.8°F | Resp 18 | Ht 64.0 in | Wt 144.6 lb

## 2023-06-08 DIAGNOSIS — S91302A Unspecified open wound, left foot, initial encounter: Secondary | ICD-10-CM | POA: Diagnosis not present

## 2023-06-08 MED ORDER — DOXYCYCLINE HYCLATE 100 MG PO TABS
100.0000 mg | ORAL_TABLET | Freq: Two times a day (BID) | ORAL | 0 refills | Status: DC
Start: 2023-06-08 — End: 2023-06-08

## 2023-06-08 MED ORDER — DOXYCYCLINE HYCLATE 100 MG PO TABS
100.0000 mg | ORAL_TABLET | Freq: Two times a day (BID) | ORAL | 0 refills | Status: DC
Start: 2023-06-08 — End: 2023-06-11
  Filled 2023-06-08: qty 20, 10d supply, fill #0
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
## 2023-06-10 ENCOUNTER — Encounter: Payer: Self-pay | Admitting: Internal Medicine

## 2023-06-11 ENCOUNTER — Other Ambulatory Visit: Payer: Self-pay

## 2023-06-11 ENCOUNTER — Other Ambulatory Visit (HOSPITAL_BASED_OUTPATIENT_CLINIC_OR_DEPARTMENT_OTHER): Payer: Self-pay

## 2023-06-11 ENCOUNTER — Telehealth: Payer: Self-pay | Admitting: Internal Medicine

## 2023-06-11 ENCOUNTER — Ambulatory Visit: Payer: Medicare Other | Admitting: Internal Medicine

## 2023-06-11 MED ORDER — SULFAMETHOXAZOLE-TRIMETHOPRIM 800-160 MG PO TABS
1.0000 | ORAL_TABLET | Freq: Two times a day (BID) | ORAL | 0 refills | Status: DC
  Filled 2023-06-11: qty 14, 7d supply, fill #0

## 2023-06-11 NOTE — Telephone Encounter (Signed)
Pt said she saw Dr Zola Button and she is allergic to the antibiotic that was sent in for her. Please send in another one and call her to advise of change. Please send to pharmacy downstairs

## 2023-06-11 NOTE — Telephone Encounter (Signed)
Pt notified. Rx sent. Doxy added to allergy list. Pt states first time having doxy and caused diarrhea.

## 2023-06-12 LAB — WOUND CULTURE
MICRO NUMBER:: 15719034
RESULT:: NO GROWTH
SPECIMEN QUALITY:: ADEQUATE

## 2023-06-14 ENCOUNTER — Other Ambulatory Visit: Payer: Self-pay | Admitting: Internal Medicine

## 2023-06-16 ENCOUNTER — Other Ambulatory Visit: Payer: Self-pay

## 2023-06-16 MED ORDER — FUROSEMIDE 80 MG PO TABS: 40.0000 mg | ORAL_TABLET | Freq: Every day | ORAL | 0 refills | Status: DC

## 2023-06-23 ENCOUNTER — Other Ambulatory Visit: Payer: Self-pay | Admitting: Internal Medicine

## 2023-06-29 ENCOUNTER — Other Ambulatory Visit: Payer: Self-pay | Admitting: Internal Medicine

## 2023-07-01 ENCOUNTER — Ambulatory Visit: Payer: Medicare Other | Attending: Cardiology | Admitting: Cardiology

## 2023-07-01 ENCOUNTER — Encounter: Payer: Self-pay | Admitting: *Deleted

## 2023-07-01 VITALS — BP 130/68 | HR 76 | Ht 61.0 in | Wt 145.0 lb

## 2023-07-01 DIAGNOSIS — N1832 Chronic kidney disease, stage 3b: Secondary | ICD-10-CM

## 2023-07-01 DIAGNOSIS — I10 Essential (primary) hypertension: Secondary | ICD-10-CM

## 2023-07-01 DIAGNOSIS — R0609 Other forms of dyspnea: Secondary | ICD-10-CM

## 2023-07-01 DIAGNOSIS — I5032 Chronic diastolic (congestive) heart failure: Secondary | ICD-10-CM

## 2023-07-01 NOTE — Patient Instructions (Signed)
Medication Instructions:  Your physician recommends that you continue on your current medications as directed. Please refer to the Current Medication list given to you today.  *If you need a refill on your cardiac medications before your next appointment, please call your pharmacy*   Lab Work: 3rd floor [address] BMP- today If you have labs (blood work) drawn today and your tests are completely normal, you will receive your results only by: MyChart Message (if you have MyChart) OR A paper copy in the mail If you have any lab test that is abnormal or we need to change your treatment, we will call you to review the results.   Testing/Procedures: Your physician has requested that you have an echocardiogram. Echocardiography is a painless test that uses sound waves to create images of your heart. It provides your doctor with information about the size and shape of your heart and how well your heart's chambers and valves are working. This procedure takes approximately one hour. There are no restrictions for this procedure. Please do NOT wear cologne, perfume, aftershave, or lotions (deodorant is allowed). Please arrive 15 minutes prior to your appointment time.  Please note: We ask at that you not bring children with you during ultrasound (echo/ vascular) testing. Due to room size and safety concerns, children are not allowed in the ultrasound rooms during exams. Our front office staff cannot provide observation of children in our lobby area while testing is being conducted. An adult accompanying a patient to their appointment will only be allowed in the ultrasound room at the discretion of the ultrasound technician under special circumstances. We apologize for any inconvenience.    Follow-Up: At [facility], you and your health needs are our priority.  As part of our continuing mission to provide you with exceptional heart care, we have created designated Provider Care Teams.  These Care Teams  include your primary Cardiologist (physician) and Advanced Practice Providers (APPs -  Physician Assistants and Nurse Practitioners) who all work together to provide you with the care you need, when you need it.  We recommend signing up for the patient portal called "MyChart".  Sign up information is provided on this After Visit Summary.  MyChart is used to connect with patients for Virtual Visits (Telemedicine).  Patients are able to view lab/test results, encounter notes, upcoming appointments, etc.  Non-urgent messages can be sent to your provider as well.   To learn more about what you can do with MyChart, go to [email or web address].    Your next appointment:   6 month(s)  The format for your next appointment:   In Person  Provider:   [name], MD    Other Instructions NA
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
## 2023-07-02 LAB — BASIC METABOLIC PANEL
BUN/Creatinine Ratio: 23 (ref 12–28)
BUN: 32 mg/dL — ABNORMAL HIGH (ref 8–27)
CO2: 26 mmol/L (ref 20–29)
Calcium: 8.7 mg/dL (ref 8.7–10.3)
Chloride: 96 mmol/L (ref 96–106)
Creatinine, Ser: 1.42 mg/dL — ABNORMAL HIGH (ref 0.57–1.00)
Glucose: 303 mg/dL — ABNORMAL HIGH (ref 70–99)
Potassium: 5.2 mmol/L (ref 3.5–5.2)
Sodium: 139 mmol/L (ref 134–144)
eGFR: 42 mL/min/{1.73_m2} — ABNORMAL LOW (ref >59)

## 2023-07-07 ENCOUNTER — Other Ambulatory Visit: Payer: Self-pay | Admitting: Internal Medicine

## 2023-07-11 ENCOUNTER — Other Ambulatory Visit: Payer: Self-pay | Admitting: Internal Medicine

## 2023-07-19 ENCOUNTER — Telehealth: Payer: Self-pay

## 2023-07-19 NOTE — Telephone Encounter (Signed)
-----   Message from [name] sent at 07/13/2023  8:44 PM EST ----- Labs are stable

## 2023-07-19 NOTE — Telephone Encounter (Signed)
Patient notified through my chart.

## 2023-07-23 ENCOUNTER — Encounter: Payer: Self-pay | Admitting: Internal Medicine

## 2023-07-23 NOTE — Telephone Encounter (Signed)
Spoke with pt, scheduled pt an OV 07/27/2023.

## 2023-07-26 ENCOUNTER — Ambulatory Visit (HOSPITAL_BASED_OUTPATIENT_CLINIC_OR_DEPARTMENT_OTHER)
Admission: RE | Admit: 2023-07-26 | Discharge: 2023-07-26 | Disposition: A | Discharge status: Home / Self Care | Payer: Medicare Other | Source: Ambulatory Visit | Attending: Cardiology | Admitting: Cardiology

## 2023-07-26 DIAGNOSIS — R0609 Other forms of dyspnea: Secondary | ICD-10-CM | POA: Diagnosis not present

## 2023-07-26 LAB — ECHOCARDIOGRAM COMPLETE
AR max vel: 1.4 cm2
AV Area VTI: 1.49 cm2
AV Area mean vel: 1.34 cm2
AV Mean grad: 7.5 mm[Hg]
AV Peak grad: 13.4 mm[Hg]
Ao pk vel: 1.83 m/s
Area-P 1/2: 3.89 cm2
Calc EF: 81.1 %
MV M vel: 4.54 m/s
MV Peak grad: 82.3 mm[Hg]
P 1/2 time: 602 ms
S' Lateral: 2.6 cm
Single Plane A2C EF: 84.3 %
Single Plane A4C EF: 74.7 %

## 2023-07-26 MED ORDER — PERFLUTREN LIPID MICROSPHERE
1.0000 mL | INTRAVENOUS | Status: AC | PRN
  Administered 2023-07-26: 2 mL via INTRAVENOUS

## 2023-07-27 ENCOUNTER — Ambulatory Visit (HOSPITAL_BASED_OUTPATIENT_CLINIC_OR_DEPARTMENT_OTHER)
Admission: RE | Admit: 2023-07-27 | Discharge: 2023-07-27 | Disposition: A | Discharge status: Home / Self Care | Payer: Medicare Other | Source: Ambulatory Visit | Attending: Internal Medicine | Admitting: Internal Medicine

## 2023-07-27 ENCOUNTER — Ambulatory Visit (INDEPENDENT_AMBULATORY_CARE_PROVIDER_SITE_OTHER): Payer: Medicare Other | Admitting: Internal Medicine

## 2023-07-27 ENCOUNTER — Encounter: Payer: Self-pay | Admitting: Internal Medicine

## 2023-07-27 VITALS — BP 136/84 | HR 74 | Temp 98.0°F | Resp 16 | Ht 61.0 in | Wt 145.2 lb

## 2023-07-27 DIAGNOSIS — M545 Low back pain, unspecified: Secondary | ICD-10-CM | POA: Insufficient documentation

## 2023-07-27 DIAGNOSIS — G8929 Other chronic pain: Secondary | ICD-10-CM | POA: Diagnosis not present

## 2023-07-27 DIAGNOSIS — M47816 Spondylosis without myelopathy or radiculopathy, lumbar region: Secondary | ICD-10-CM | POA: Diagnosis not present

## 2023-07-27 DIAGNOSIS — M542 Cervicalgia: Secondary | ICD-10-CM | POA: Diagnosis not present

## 2023-07-27 DIAGNOSIS — M4802 Spinal stenosis, cervical region: Secondary | ICD-10-CM | POA: Diagnosis not present

## 2023-07-27 DIAGNOSIS — I5032 Chronic diastolic (congestive) heart failure: Secondary | ICD-10-CM

## 2023-07-27 DIAGNOSIS — I480 Paroxysmal atrial fibrillation: Secondary | ICD-10-CM

## 2023-07-27 DIAGNOSIS — M419 Scoliosis, unspecified: Secondary | ICD-10-CM | POA: Diagnosis not present

## 2023-07-27 DIAGNOSIS — M549 Dorsalgia, unspecified: Secondary | ICD-10-CM | POA: Diagnosis not present

## 2023-07-27 NOTE — Progress Notes (Signed)
 Subjective:    Patient ID: Adrienne Bennett, female    DOB: 07-05-1961, 62 y.o.   MRN: [id number]  DOS:  07/27/2023 Type of visit - description: Acute, here with her husband.  10 days ago developed neck and lumbar spine pain. The cervical spine pain does not radiate, increases with moving the neck, her range of motion has been limited.  The lumbar pain is midline with no radiation.  Denies any falls or injuries. No fever or chills. No unusual bladder or bowel symptoms. No headache, no myalgias. Taking Tylenol .  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    GANGLION CYST 07/22/2010   Qualifier: Diagnosis of  By: Amon MD, Adi Seales E.    GERD (gastroesophageal reflux disease) 05/23/2017   Hyperlipidemia 10/12/2012   IBS (irritable bowel syndrome)    chronic Diarrhea   Macular degeneration, dry    MIGRAINE HEADACHE 03/13/2010   Qualifier: Diagnosis of  By: Amon MD, Aloysius BRAVO.    Neck pain 02/03/2011   Osteoarthritis    back; goes down my right leg (09/01/2013)   Subclinical hyperthyroidism 02/26/2018   Thyromegaly 12/04/2014   Type II diabetes mellitus (HCC) 2007    Past Surgical History:  Procedure Laterality Date   CARPAL TUNNEL RELEASE Bilateral ~ 2000   cataract surgery Bilateral    TONSILLECTOMY  07/27/1968   TUBAL LIGATION  07/27/1994    Current Outpatient Medications  Medication Instructions   albuterol  (VENTOLIN  HFA) 108 (90 Base) MCG/ACT inhaler 2 puffs, Inhalation, Every 6 hours PRN   amLODipine  (NORVASC ) 10 mg, Oral, Daily   apixaban   (ELIQUIS ) 5 mg, Oral, 2 times daily   atorvastatin  (LIPITOR) 40 mg, Oral, Daily at bedtime   BREO ELLIPTA  100-25 MCG/ACT AEPB INHALE 1 PUFF INTO THE LUNGS  ONCE DAILY   clonazePAM  (KLONOPIN ) 1 mg, 2 times daily   dapagliflozin  propanediol (FARXIGA ) 10 mg, Oral, Daily   divalproex  (DEPAKOTE ) 250-500 mg, See admin instructions   DULoxetine  (CYMBALTA ) 60 mg, Every morning   Estradiol  10 MCG TABS vaginal tablet Place 1 tablet (10 mcg total) vaginally at bedtime for 14 days, THEN 1 tablet (10 mcg total) 2 (two) times a week.   furosemide  (LASIX ) 40 mg, Oral, Daily, 1rst attempt, patient needs and appt for additional refills   glimepiride  (AMARYL ) 2 MG tablet 1 tablet, Daily before breakfast   ipratropium-albuterol  (DUONEB) 0.5-2.5 (3) MG/3ML SOLN 3 mLs, Nebulization, 3 times daily PRN   metoprolol  tartrate (LOPRESSOR ) 50 mg, Oral, 2 times daily   oxymetazoline  (AFRIN) 0.05 % nasal spray 1 spray, 2 times daily PRN   potassium chloride  (KLOR-CON ) 10 MEQ tablet 10 mEq, Oral, Daily   sulfamethoxazole -trimethoprim  (BACTRIM  DS) 800-160 MG tablet 1 tablet, Oral, 2 times daily   triamcinolone  lotion (KENALOG ) 0.1 % 1 Application, Topical, 3 times daily       Objective:   Physical Exam BP 136/84   Pulse 74   Temp 98 F (36.7 C) (Oral)   Resp 16   Ht 5' 1 (1.549 m)   Wt 145 lb 4 oz (  65.9 kg)   LMP 08/24/2015 (Exact Date)   SpO2 98%   BMI 27.44 kg/m  General:   Well developed, NAD, BMI noted. HEENT:  Normocephalic . Face symmetric, atraumatic MSK: Slightly TTP at the distal cervical spine and at the proximal lumbar spine. Neck range of motion limited throughout. Lower extremities: no pretibial edema bilaterally  Skin: Not pale. Not jaundice Neurologic:  alert & oriented X3.  Speech normal, gait: Assisted by cane. Needed help transferring to the examining table. Motor: Symmetric except for very subtle weakness of the left leg.  (Patient recalled this is not a new issue). DTRs: Present  but decreased throughout. Psych--  Cognition and judgment appear intact.  Cooperative with normal attention span and concentration.  Behavior appropriate. No anxious or depressed appearing.      Assessment     Assessment ENDO: started to see Dr Hosie 12/2017 DM  HTN -- dc lisinopril  06-2015>> lips swell x1 , also had stomatitis >> resolved  High cholesterol GI: ---IBS (diarrhea, unable to control BMs) ---Fatty Liver per CT 04-2020 Thyroid  disease -Multinodular goiter, thyromegaly w/ dominant nodule: Negative BX 01-2015 - Hyperthyroidism, subclinical, DX 2019 --s/p thyroid  ablation 2019 --thyroid  US  04/02/2023 -- see report ? IBS.Chronic diarrhea DJD Psychiatry: Sees Dr. 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 On chart review, she was seen with neck pain 02-2020 at [facility]. The patient suspect this is DJD related and I agree. Will get x-rays of the neck and lumbar spine , send her to orthopedics. As far as pain management,  I am not able to prescribe prednisone  due to uncontrolled DM, she did not tolerate gabapentin  before, avoid NSAIDs due to slightly decreased renal function.  Options are limited thus we will continue Tylenol  although she said it made her sleepy which is unusual. Diastolic CHF, A-fib, saw cardiology 07/01/2023, felt to be stable, had a echo yesterday.  EF 70 to 75%.,  Further advised by cardiology Respiratory failure: Saw pulmonary 05/13/2023, spirometry show evidence of severe restriction but no definitive obstruction.  Was recommended full  PFTs.  And a high-resolution CT chest. Thyromegaly: Thyroid  ultrasound bone September 2024, 2 nodules meet arteria for biopsy, this was discussed with endocrinology, they were present before thyroid  ablation thighs no indication for biopsy. If hoarseness persists, consider ENT referral.  Will reassess at the next visit. RTC 2 months

## 2023-07-27 NOTE — Assessment & Plan Note (Signed)
 Neck, lumbar spine pain: Started 10 days ago, no systemic symptoms, no fall or injury. On chart review, she was seen with neck pain 02-2020 at [facility]. The patient suspect this is DJD related and I agree. Will get x-rays of the neck and lumbar spine , send her to orthopedics. As far as pain management,  I am not able to prescribe prednisone  due to uncontrolled DM, she did not tolerate gabapentin  before, avoid NSAIDs due to slightly decreased renal function.  Options are limited thus we will continue Tylenol  although she said it made her sleepy which is unusual. Diastolic CHF, A-fib, saw cardiology 07/01/2023, felt to be stable, had a echo yesterday.  EF 70 to 75%.,  Further advised by cardiology Respiratory failure: Saw pulmonary 05/13/2023, spirometry show evidence of severe restriction but no definitive obstruction.  Was recommended full PFTs.  And a high-resolution CT chest. Thyromegaly: Thyroid  ultrasound bone September 2024, 2 nodules meet arteria for biopsy, this was discussed with endocrinology, they were present before thyroid  ablation thighs no indication for biopsy. If hoarseness persists, consider ENT referral.  Will reassess at the next visit. RTC 2 months

## 2023-07-27 NOTE — Patient Instructions (Addendum)
 Arrange for a follow-up with me in 2 months.  Proceed with x-rays downstairs.  We are referring to orthopedics  Okay to take Tylenol . You may also like to use a heating pad or warm towel on the neck and back   Vaccines I recommend: Covid booster Flu shot Tdap (tetanus)    Per our records you are due for your diabetic eye exam. Please contact your eye doctor to schedule an appointment. Please have them send copies of your office visit notes to us . Our fax number is [phone]. If you need a referral to an eye doctor please let us  know.

## 2023-07-30 ENCOUNTER — Ambulatory Visit (INDEPENDENT_AMBULATORY_CARE_PROVIDER_SITE_OTHER): Payer: Medicare Other | Admitting: Physical Medicine and Rehabilitation

## 2023-07-30 ENCOUNTER — Other Ambulatory Visit: Payer: Self-pay

## 2023-07-30 ENCOUNTER — Encounter: Payer: Self-pay | Admitting: Physical Medicine and Rehabilitation

## 2023-07-30 ENCOUNTER — Other Ambulatory Visit (HOSPITAL_BASED_OUTPATIENT_CLINIC_OR_DEPARTMENT_OTHER): Payer: Self-pay

## 2023-07-30 VITALS — BP 167/77 | HR 66

## 2023-07-30 DIAGNOSIS — G8929 Other chronic pain: Secondary | ICD-10-CM | POA: Diagnosis not present

## 2023-07-30 DIAGNOSIS — M542 Cervicalgia: Secondary | ICD-10-CM

## 2023-07-30 DIAGNOSIS — M545 Low back pain, unspecified: Secondary | ICD-10-CM

## 2023-07-30 DIAGNOSIS — M4156 Other secondary scoliosis, lumbar region: Secondary | ICD-10-CM | POA: Diagnosis not present

## 2023-07-30 DIAGNOSIS — M48061 Spinal stenosis, lumbar region without neurogenic claudication: Secondary | ICD-10-CM

## 2023-07-30 MED ORDER — BACLOFEN 10 MG PO TABS
10.0000 mg | ORAL_TABLET | Freq: Two times a day (BID) | ORAL | 0 refills | Status: DC
  Filled 2023-07-30 (×2): qty 30, 15d supply, fill #0

## 2023-07-30 NOTE — Progress Notes (Signed)
 Functional Pain Scale - descriptive words and definitions  Intense (8)    Cannot complete any ADLs without much assistance/cannot concentrate/conversation is difficult/unable to sleep and unable to use distraction. Severe range order  Average Pain 8  Midline lower back pain around the hip area. Neck pain with limited ROM. Pain radiated down right arm. Pain with Flex, Ex.  Hx of scoliosis

## 2023-07-30 NOTE — Progress Notes (Signed)
 Adrienne Bennett - 63 y.o. female MRN [id number]  Date of birth: 01/06/1961  Office Visit Note: Visit Date: 07/30/2023 PCP: Amon Aloysius BRAVO, MD Referred by: Amon Aloysius BRAVO, MD  Subjective: Chief Complaint  Patient presents with   Lower Back - Pain   Neck - Pain   Right Arm - Pain   HPI: Adrienne Bennett is a 63 y.o. female who comes in today per the request of Dr. Aloysius Amon for evaluation of chronic, worsening bilateral lower back pain. She is accompanied by her son today. Pain ongoing for over 1 year. Prior issues with lower back pain, right leg weakness and frequent falls. She reports history of scoliosis, has used back brace in the past. Her pain is constant, worsens with laying flat, movement and activity. She also reports worsening pain with eating as she feels food in her stomach presses against her spine. She describes pain as sore and aching sensation, currently rates as 8 out of 10. She has tried home exercise regimen, heating pad, rest and use of medications with minimal relief of pain. She is unable to take Gabapentin  due to allergic reaction. History of formal physical therapy for more balance issues. She was evaluated by Steva Chatters, PA with [facility] in 2023. These notes can be reviewed in Care Everywhere. Patient states she considered lumbar injections, however she is concerned these injections are very painful. Lumbar MRI Imaging from 04/02/2022 exhibits moderate levoscoliosis, there is severe degenerative changes at L3-L4 where there is disc height loss, modic type II marrow changes, facet arthropathy and moderate spinal canal stenosis. She is currently using cane to assist with ambulation. Patient denies focal weakness, numbness and tingling. No recent trauma or falls.   She also reports acute bilateral neck pain radiating to right periscapular region and down right arm. Pain ongoing for 2 weeks. No recent injury. Her pain worsens with laying in bed. She describes  pain as sore and aching sensation, currently rates as 8 out of 10. Some relief of pain with home exercise regimen, rest and use of medications. Recent cervical radiographs show normal anatomical alignment, no spondylolisthesis, multi level degenerative changes. No prior cervical MRI imaging.   Patient has extensive medical history including heart failure, atrial fibrillation, COPD, diabetes mellitus, CKD, bipolar disorder, and depression. Of note, there are multiple allergies/intolerances to medications listed.      Review of Systems  Musculoskeletal:  Positive for back pain, falls, myalgias and neck pain.  Neurological:  Negative for tingling, focal weakness and weakness.  All other systems reviewed and are negative.  Otherwise per HPI.  Assessment & Plan: Visit Diagnoses:    ICD-10-CM   1. Chronic bilateral low back pain without sciatica  M54.50 Ambulatory referral to Physical Therapy   G89.29     2. Cervicalgia  M54.2 Ambulatory referral to Physical Therapy       Plan: Findings:  1. Chronic, worsening and severe bilateral lower back pain. No radicular symptoms down the legs. Patient continues to have severe pain despite good conservative therapies such as formal physical therapy, home exercise regimen, rest and use of medications. Patients clinical presentation and exam are complex. There are significant degenerative findings at the level of L3-L4 including discogenic marrow changes, facet arthrosis and moderate central canal stenosis. I do think these findings could be generating her pain. I would like patient to try and obtain disc of lumbar MRI imaging from 2023 with [facility] so that myself and Dr.  Eldonna can review images. She is not particularly interested in injection therapy at this time, as I stated earlier there is concern that injections are extremely uncomfortable. I placed order for her to re-group with short course of formal physical therapy. PT seemed to help her greatly last  year, especially with balance, gait and strengthening. I will see her back after course of PT, can talk further about possible lumbar injection at that time, will also review MRI imaging.   2. Acute bilateral neck pain radiating to right periscapular region and down right arm. Patient continues to have severe pain despite good conservative therapies such as home exercise regimen, rest and use of medications. Patients clinical presentation and exam are consistent with cervical myofascial strain. Tenderness noted to bilateral levator scapulae regions upon palpation today. She is extremely tender to touch. I would also like for physical therapy to evaluate her neck pain, consider manual treatments and possible dry needing. We discussed medication management, I am limited in prescribing options due to her medical history, however I prescribed short course of Baclofen  for her to try. We will see her back after PT for re-evaluation. No red flag symptoms noted upon exam today.       Meds & Orders:  Meds ordered this encounter  Medications   baclofen  (LIORESAL ) 10 MG tablet    Sig: Take 1 tablet (10 mg total) by mouth 2 (two) times daily.    Dispense:  30 each    Refill:  0    Orders Placed This Encounter  Procedures   Ambulatory referral to Physical Therapy    Follow-up: Return if symptoms worsen or fail to improve.   Procedures: No procedures performed      Clinical History: No specialty comments available.   She reports that she has never smoked. She has never used smokeless tobacco.  Recent Labs    11/18/22 0000 02/17/23 0000 05/25/23 0000  HGBA1C 8.1 10.5 9.1    Objective:  VS:  HT:    WT:   BMI:     BP:(!) 167/77  HR:66bpm  TEMP: ( )  RESP:  Physical Exam Vitals and nursing note reviewed.  HENT:     Head: Normocephalic and atraumatic.     Right Ear: External ear normal.     Left Ear: External ear normal.     Nose: Nose normal.     Mouth/Throat:     Mouth: Mucous  membranes are moist.  Eyes:     Extraocular Movements: Extraocular movements intact.  Cardiovascular:     Rate and Rhythm: Normal rate.     Pulses: Normal pulses.  Pulmonary:     Effort: Pulmonary effort is normal.  Abdominal:     General: Abdomen is flat. There is no distension.  Musculoskeletal:        General: Tenderness present.     Cervical back: Tenderness present.     Comments: Patient rises from seated position to standing without difficulty. Good lumbar range of motion. No pain noted with facet loading. 5/5 strength noted with bilateral hip flexion, knee flexion/extension, ankle dorsiflexion/plantarflexion and EHL. No clonus noted bilaterally. No pain upon palpation of greater trochanters. No pain with internal/external rotation of bilateral hips. Sensation intact bilaterally. Negative slump test bilaterally. Ambulates without aid, gait steady.   No discomfort noted with flexion, extension and side-to-side rotation. Patient has good strength in the upper extremities including 5 out of 5 strength in wrist extension, long finger flexion and APB. Shoulder range of  motion is full bilaterally without any sign of impingement. There is no atrophy of the hands intrinsically. Sensation intact bilaterally. Myofascial tenderness noted to bilateral levator scapulae regions. Negative Hoffman's sign. Negative Spurling's sign.       Skin:    General: Skin is warm and dry.     Capillary Refill: Capillary refill takes less than 2 seconds.  Neurological:     Mental Status: She is alert and oriented to person, place, and time.     Gait: Gait abnormal.  Psychiatric:        Mood and Affect: Mood normal.        Behavior: Behavior normal.     Ortho Exam  Imaging: No results found.  Past Medical/Family/Surgical/Social History: Medications & Allergies reviewed per EMR, new medications updated. Patient Active Problem List   Diagnosis Date Noted   Atrial ectopy 02/02/2023   Hypokalemia  02/02/2023   Hypomagnesemia 02/02/2023   Hypocalcemia 02/02/2023   Physical debility 08/03/2022   Chronic heart failure with preserved ejection fraction (HFpEF) (HCC) 08/03/2022   Exocrine pancreatic insufficiency 04/04/2022   Paroxysmal atrial fibrillation (HCC) 04/01/2022   Full incontinence of feces 02/15/2022   Chronic diarrhea 02/15/2022   Irritable bowel syndrome with diarrhea 02/15/2022   Nausea 02/15/2022   History of colonic polyps 02/15/2022   Chronic anticoagulation 02/15/2022   Change in bowel habits 02/15/2022   On home oxygen  therapy 02/15/2022   Acute on chronic diastolic CHF (congestive heart failure) (HCC) 01/23/2022   Chronic respiratory failure (HCC) 01/23/2022   CKD (chronic kidney disease) stage 3, GFR 30-59 ml/min (HCC) 01/23/2022   Acute respiratory failure with hypoxia (HCC) 01/10/2022   AKI (acute kidney injury) (HCC) 01/09/2022   COPD (chronic obstructive pulmonary disease) (HCC) 01/08/2022   Dizziness 04/08/2021   Combined forms of age-related cataract of left eye 04/01/2021   High cholesterol    Congestive heart failure (HCC) 12/03/2020   Osteoarthritis    IBS (irritable bowel syndrome)    Depression    Macular degeneration, dry 02/01/2020   Diabetic retinopathy (HCC) 03/31/2018   Subclinical hyperthyroidism 02/26/2018   GERD (gastroesophageal reflux disease) 05/23/2017   DJD (degenerative joint disease) 03/04/2016   PCP NOTES >>>>>>>>>> 12/02/2015   Thyromegaly 12/04/2014   Bipolar affective disorder (HCC)    Annual physical exam 07/13/2012   Neck pain 02/03/2011   Essential hypertension 09/04/2010   Ganglion 07/22/2010   MIGRAINE HEADACHE 03/13/2010   DM (diabetes mellitus), secondary uncontrolled (HCC) 07/16/2008   Type II diabetes mellitus (HCC) 2007   Past Medical History:  Diagnosis Date   Bipolar affective disorder (HCC)    PTSD, agoraphobiam ,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    GANGLION CYST 07/22/2010   Qualifier: Diagnosis of  By: Amon MD, Jose E.    GERD (gastroesophageal reflux disease) 05/23/2017   Hyperlipidemia 10/12/2012   IBS (irritable bowel syndrome)    chronic Diarrhea   Macular degeneration, dry    MIGRAINE HEADACHE 03/13/2010   Qualifier: Diagnosis of  By: Amon MD, Aloysius BRAVO.    Neck pain 02/03/2011   Osteoarthritis    back; goes down my right leg (09/01/2013)   Subclinical hyperthyroidism 02/26/2018   Thyromegaly  12/04/2014   Type II diabetes mellitus (HCC) 2007   Family History  Problem Relation Age of Onset   Diabetes Mother    Coronary artery disease Father        age? 50s?   Diabetes Sister    CAD Sister    Hypertension Sister    Coronary artery disease Brother        age?, 81s?   Hypertension Brother    Diabetes Maternal Grandmother    Colon cancer Neg Hx    Breast cancer Neg Hx    Stomach cancer Neg Hx    Rectal cancer Neg Hx    Esophageal cancer Neg Hx    Inflammatory bowel disease Neg Hx    Liver disease Neg Hx    Pancreatic cancer Neg Hx    Past Surgical History:  Procedure Laterality Date   CARPAL TUNNEL RELEASE Bilateral ~ 2000   cataract surgery Bilateral    TONSILLECTOMY  07/27/1968   TUBAL LIGATION  07/27/1994   Social History   Occupational History   Occupation: stay home  Tobacco Use   Smoking status: Never   Smokeless tobacco: Never  Vaping Use   Vaping status: Never Used  Substance and Sexual Activity   Alcohol use: No    Alcohol/week: 0.0 standard drinks of alcohol   Drug use: No   Sexual activity: Yes

## 2023-08-09 ENCOUNTER — Ambulatory Visit: Payer: Medicare Other | Admitting: Physical Therapy

## 2023-08-09 ENCOUNTER — Encounter: Payer: Self-pay | Admitting: Physical Therapy

## 2023-08-09 ENCOUNTER — Other Ambulatory Visit: Payer: Self-pay

## 2023-08-09 DIAGNOSIS — R29898 Other symptoms and signs involving the musculoskeletal system: Secondary | ICD-10-CM | POA: Diagnosis not present

## 2023-08-09 DIAGNOSIS — M5459 Other low back pain: Secondary | ICD-10-CM

## 2023-08-09 DIAGNOSIS — R2689 Other abnormalities of gait and mobility: Secondary | ICD-10-CM | POA: Diagnosis not present

## 2023-08-09 DIAGNOSIS — M542 Cervicalgia: Secondary | ICD-10-CM | POA: Diagnosis not present

## 2023-08-09 DIAGNOSIS — M6281 Muscle weakness (generalized): Secondary | ICD-10-CM

## 2023-08-09 NOTE — Therapy (Signed)
 OUTPATIENT PHYSICAL THERAPY THORACOLUMBAR EVALUATION   Patient Name: Adrienne Bennett MRN: [id number] DOB:08-11-1960, 63 y.o., female Today's Date: 08/09/2023  END OF SESSION:  PT End of Session - 08/09/23 1438     Visit Number 1    Number of Visits 13    Date for PT Re-Evaluation 09/20/23    Authorization Type [employer] MCR    Authorization Time Period 08/09/23 to 09/20/23    Progress Note Due on Visit 10    PT Start Time 1343    PT Stop Time 1427    PT Time Calculation (min) 44 min    Activity Tolerance Patient tolerated treatment well    Behavior During Therapy WFL for tasks assessed/performed             Past Medical History:  Diagnosis Date   Bipolar affective disorder (HCC)    PTSD, agoraphobiam ,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    GANGLION CYST 07/22/2010   Qualifier: Diagnosis of  By: Amon MD, Jose E.    GERD (gastroesophageal reflux disease) 05/23/2017   Hyperlipidemia 10/12/2012   IBS (irritable bowel syndrome)    chronic Diarrhea   Macular degeneration, dry    MIGRAINE HEADACHE 03/13/2010   Qualifier: Diagnosis of  By: Amon MD, Aloysius BRAVO.    Neck pain 02/03/2011   Osteoarthritis    back; goes down my right leg (09/01/2013)   Subclinical hyperthyroidism 02/26/2018   Thyromegaly 12/04/2014   Type II diabetes mellitus (HCC) 2007   Past Surgical History:  Procedure Laterality Date   CARPAL TUNNEL RELEASE Bilateral ~ 2000   cataract surgery Bilateral    TONSILLECTOMY  07/27/1968   TUBAL LIGATION  07/27/1994   Patient Active Problem List   Diagnosis Date Noted   Atrial ectopy 02/02/2023   Hypokalemia 02/02/2023   Hypomagnesemia 02/02/2023   Hypocalcemia  02/02/2023   Physical debility 08/03/2022   Chronic heart failure with preserved ejection fraction (HFpEF) (HCC) 08/03/2022   Exocrine pancreatic insufficiency 04/04/2022   Paroxysmal atrial fibrillation (HCC) 04/01/2022   Full incontinence of feces 02/15/2022   Chronic diarrhea 02/15/2022   Irritable bowel syndrome with diarrhea 02/15/2022   Nausea 02/15/2022   History of colonic polyps 02/15/2022   Chronic anticoagulation 02/15/2022   Change in bowel habits 02/15/2022   On home oxygen  therapy 02/15/2022   Acute on chronic diastolic CHF (congestive heart failure) (HCC) 01/23/2022   Chronic respiratory failure (HCC) 01/23/2022   CKD (chronic kidney disease) stage 3, GFR 30-59 ml/min (HCC) 01/23/2022   Acute respiratory failure with hypoxia (HCC) 01/10/2022   AKI (acute kidney injury) (HCC) 01/09/2022   COPD (chronic obstructive pulmonary disease) (HCC) 01/08/2022   Dizziness 04/08/2021   Combined forms of age-related cataract of left eye 04/01/2021   High cholesterol    Congestive heart failure (HCC) 12/03/2020   Osteoarthritis    IBS (irritable bowel syndrome)    Depression    Macular degeneration, dry 02/01/2020   Diabetic retinopathy (HCC) 03/31/2018   Subclinical hyperthyroidism 02/26/2018   GERD (gastroesophageal reflux disease) 05/23/2017   DJD (degenerative joint disease) 03/04/2016   PCP NOTES >>>>>>>>>> 12/02/2015   Thyromegaly 12/04/2014   Bipolar affective disorder (HCC)  Annual physical exam 07/13/2012   Neck pain 02/03/2011   Essential hypertension 09/04/2010   Ganglion 07/22/2010   MIGRAINE HEADACHE 03/13/2010   DM (diabetes mellitus), secondary uncontrolled (HCC) 07/16/2008   Type II diabetes mellitus (HCC) 2007    PCP: Amon Schanz MD   REFERRING PROVIDER: Trudy Duwaine BRAVO, NP  REFERRING DIAG:  Diagnosis  M54.50,G89.29 (ICD-10-CM) - Chronic bilateral low back pain without sciatica  M54.2 (ICD-10-CM) - Cervicalgia    Rationale for Evaluation and  Treatment: Rehabilitation  THERAPY DIAG:  Other low back pain  Cervicalgia  Muscle weakness (generalized)  Other abnormalities of gait and mobility  Other symptoms and signs involving the musculoskeletal system  ONSET DATE: about 3 weeks ago   SUBJECTIVE:                                                                                                                                                                                           SUBJECTIVE STATEMENT:   This pain has been around for about 3 weeks, I had to wear a back brace as a teenager due to scoliosis. My neck where it meets my spine I can feel the bones rubbing against each other and I can hear it sometimes. Its the front of the shoulder and down but in my back. I have pain where my spine meets my tailbone. My hips hurt a lot today, they never hurt. Tripped on cane and fell last week, skinned knees.    PERTINENT HISTORY:   See above   PAIN:  Are you having pain? Yes: NPRS scale: 6/10  Pain location: low back, neck, shoulder, hips  Pain description: it just hurts all the time  Aggravating factors: laying flat in bed  Relieving factors: tylenol  (extra strength)  PRECAUTIONS: Fall  RED FLAGS: None   WEIGHT BEARING RESTRICTIONS: No  FALLS:  Has patient fallen in last 6 months? Yes. Number of falls 1; tripped over cane, no FOF I'm used to it   LIVING ENVIRONMENT: Lives with: lives with their family Lives in: House/apartment Stairs: two story home, has to go upstairs to bedroom/bathroom no chair lift  Has following equipment at home: Single point cane and home O2   OCCUPATION: disability   PLOF: Independent, Independent with basic ADLs, Independent with gait, Independent with transfers, and Requires assistive device for independence  PATIENT GOALS: not hurt/pain control, be able to be more active and go on hikes   NEXT MD VISIT: Referring after PT   OBJECTIVE:  Note: Objective measures were  completed at Evaluation unless otherwise noted.  DIAGNOSTIC FINDINGS:   FINDINGS: There is no evidence of lumbar  spine fracture. Scoliosis of spine. Mild-to-moderate degenerative joint changes throughout lumbar spine with narrow intervertebral space, anterior spurring and facet joint sclerosis.   IMPRESSION: Mild-to-moderate degenerative joint changes throughout lumbar spine.  FINDINGS: Straightening of the cervical spine. Suboptimal visualization C7 and below. Normal prevertebral soft tissue thickness. Vertebral body heights are maintained. Moderate diffuse disc space narrowing C3 through C7. Dens and lateral masses are partially obscured by teeth   IMPRESSION: Straightening of the cervical spine with moderate diffuse disc space narrowing C3 through C7.    PATIENT SURVEYS:  FOTO 36, predicted 46 in 13 visits   COGNITION: Overall cognitive status: Within functional limits for tasks assessed      POSTURE: rounded shoulders, forward head, decreased lumbar lordosis, and increased thoracic kyphosis  PALPATION:   Tight but not tender to palpation  LUMBAR ROM:   AROM eval  Flexion Severe limitation   Extension Severe limitation   Right lateral flexion Severe limitation   Left lateral flexion Severe limitation   Right rotation Severe limitation   Left rotation Severe limitation   Cervical flexion  40*  Cervical extension 7*  Cervical lateral flexion  R 7* L 13*  Cervical rotation  R 33* L 30*   (Blank rows = not tested)    LOWER EXTREMITY MMT:    MMT Right eval Left eval  Hip flexion 3 3  Hip extension    Hip abduction 2  2  Hip adduction    Hip internal rotation    Hip external rotation    Knee flexion    Knee extension 4 4  Ankle dorsiflexion 5 5  Ankle plantarflexion    Ankle inversion    Ankle eversion     (Blank rows = not tested)   Shoulder flexion 4/5 B, shoulder ABD R 4/5 L 3/5, biceps 3+/5, triceps 2/5     GAIT: Distance walked: in  clinic distances  Assistive device utilized: Single point cane Level of assistance: Modified independence Comments: significant LLD noted from scoliosis   TREATMENT DATE:    08/09/23  Eval, care planning, HEP   Seated LAQs red TB x5 B Seated single leg hip ABD x5 B red TB Bicep curls 2# x10 B Lumbar flexion stretch x30 seconds Chin tucks x10                                                                                                                                    PATIENT EDUCATION:  Education details: exam findings, POC, HEP  Person educated: Patient and Spouse Education method: Explanation, Facilities Manager, and Handouts Education comprehension: verbalized understanding, returned demonstration, verbal cues required, and needs further education  HOME EXERCISE PROGRAM: Access Code: [email or web address] URL: [email or web address] Date: 08/09/2023 Prepared by: Josette Rough  Exercises - Seated Knee Extension with Anchored Resistance  - 1 x daily - 7 x weekly - 2 sets - 10 reps - 2 seconds  hold - Seated Single  Leg Hip Abduction with Resistance  - 1 x daily - 7 x weekly - 2 sets - 10 reps - 2 seconds  hold - Seated Bicep Curls Supinated with Dumbbells  - 1 x daily - 7 x weekly - 2 sets - 10 reps - 1 second  hold - Seated Flexion Stretch  - 2 x daily - 7 x weekly - 1 sets - 2 reps - 30 seconds hold - Seated Cervical Retraction  - 2 x daily - 7 x weekly - 1 sets - 10 reps - 2 seconds  hold  ASSESSMENT:  CLINICAL IMPRESSION: Patient is a 63 y.o. F who was seen today for physical therapy evaluation and treatment for  Diagnosis  M54.50,G89.29 (ICD-10-CM) - Chronic bilateral low back pain without sciatica  M54.2 (ICD-10-CM) - Cervicalgia  . Exam with functional measures as above. Will make every effort to improve pain and improve QOL moving forward- might benefit from skilled water PT but she is not interested in this at eval.   OBJECTIVE IMPAIRMENTS: Abnormal gait,  decreased activity tolerance, decreased balance, decreased mobility, difficulty walking, decreased ROM, decreased strength, increased fascial restrictions, increased muscle spasms, impaired flexibility, improper body mechanics, postural dysfunction, and pain.   ACTIVITY LIMITATIONS: carrying, lifting, sitting, standing, sleeping, stairs, transfers, hygiene/grooming, and locomotion level  PARTICIPATION LIMITATIONS: meal prep, cleaning, laundry, shopping, community activity, and yard work  PERSONAL FACTORS: Age, Behavior pattern, Education, Fitness, Past/current experiences, Profession, Social background, and Time since onset of injury/illness/exacerbation are also affecting patient's functional outcome.   REHAB POTENTIAL: Fair chronicity of impairments, irritability/unpredictability of pain   CLINICAL DECISION MAKING: Evolving/moderate complexity  EVALUATION COMPLEXITY: Moderate   GOALS: Goals reviewed with patient? No  SHORT TERM GOALS: Target date: 08/30/2023    Will be compliant with appropriate progressive HEP  Baseline: Goal status: INITIAL  2.  Lumbar AROM to be no more than moderately limited on all planes of motion   Baseline:  Goal status: INITIAL  3.  Cervical AROM to have improved by 10 degrees all planes of motion  Baseline:  Goal status: INITIAL   LONG TERM GOALS: Target date: 09/20/2023    MMT to have improved by one grade in all weak groups  Baseline:  Goal status: INITIAL  2.  Pain to be no more than 5/10 at worst  Baseline:  Goal status: INITIAL  3.  Will be able to tolerate community distance ambulation without increase in pain  Baseline:  Goal status: INITIAL  4.  FOTO score to have met or exceeded goal level by time of DC  Baseline:  Goal status: INITIAL  5.  Will be able to perform exercise as desired/appropriate in the gym without significant increase in pain  Baseline:  Goal status: INITIAL    PLAN:  PT FREQUENCY: 1-2x/week  PT  DURATION: 6 weeks  PLANNED INTERVENTIONS: 97164- PT Re-evaluation, 97110-Therapeutic exercises, 97530- Therapeutic activity, 97112- Neuromuscular re-education, 97535- Self Care, 02859- Manual therapy, 97760- Orthotic Fit/training, 97014- Electrical stimulation (unattended), Taping, Dry Needling, Cryotherapy, and Moist heat.  PLAN FOR NEXT SESSION: gentle mobility as tolerated, manual and DN as appropriate. Refusing water PT for now   Josette Rough, PT, DPT 08/09/23 2:44 PM    Date of referral: 07/30/23 Referring provider: Duwaine Pouch NP  Referring diagnosis?  Diagnosis  M54.50,G89.29 (ICD-10-CM) - Chronic bilateral low back pain without sciatica  M54.2 (ICD-10-CM) - Cervicalgia   Treatment diagnosis? (if different than referring diagnosis)   M54.59, M54.2, M62.81, R26.89, R29.898  What  was this (referring dx) caused by? Ongoing Issue  Lysle of Condition: Chronic (continuous duration > 3 months)   Laterality: Both  Current Functional Measure Score: FOTO 36  Objective measurements identify impairments when they are compared to normal values, the uninvolved extremity, and prior level of function.  [x]  Yes  []  No  Objective assessment of functional ability: Moderate functional limitations   Briefly describe symptoms: pt reports acute onset of pain, however impairments are likely chronic given PMH and severity of impairments of objective measures found today   How did symptoms start: idiopathically   Average pain intensity:  Last 24 hours: 8/10  Past week: 8/10  How often does the pt experience symptoms? Constantly  How much have the symptoms interfered with usual daily activities? Quite a bit  How has condition changed since care began at this facility? No change  In general, how is the patients overall health? Fair   BACK PAIN (STarT Back Screening Tool) Has pain spread down the leg(s) at some time in the last 2 weeks? no Has there been pain in the shoulder or  neck at some time in the last 2 weeks? yes Has the pt only walked short distances because of back pain? Yes  Has patient dressed more slowly because of back pain in the past 2 weeks? It depends on the day- sometimes yes, sometimes no  Does patient think it's not safe for a person with this condition to be physically active? yes Does patient have worrying thoughts a lot of the time? Yes  Does patient feel back pain is terrible and will never get any better? Yes Has patient stopped enjoying things they usually enjoy? yes

## 2023-08-11 ENCOUNTER — Telehealth: Payer: Self-pay

## 2023-08-11 NOTE — Telephone Encounter (Signed)
 Patient notified of results and verbalized understanding.

## 2023-08-11 NOTE — Telephone Encounter (Signed)
-----   Message from Ralene Burger sent at 07/30/2023  3:04 PM EST ----- Echocardiogram showed moderate left ventricle hypertrophy, ejection fraction high, mild mitral valve regurgitation

## 2023-08-12 ENCOUNTER — Encounter: Payer: Self-pay | Admitting: Internal Medicine

## 2023-08-12 ENCOUNTER — Other Ambulatory Visit (HOSPITAL_BASED_OUTPATIENT_CLINIC_OR_DEPARTMENT_OTHER): Payer: Self-pay

## 2023-08-12 ENCOUNTER — Ambulatory Visit (HOSPITAL_BASED_OUTPATIENT_CLINIC_OR_DEPARTMENT_OTHER)
Admission: RE | Admit: 2023-08-12 | Discharge: 2023-08-12 | Disposition: A | Discharge status: Home / Self Care | Payer: Medicare Other | Source: Ambulatory Visit | Attending: Physician Assistant | Admitting: Physician Assistant

## 2023-08-12 ENCOUNTER — Ambulatory Visit (INDEPENDENT_AMBULATORY_CARE_PROVIDER_SITE_OTHER): Payer: Medicare Other | Admitting: Physician Assistant

## 2023-08-12 VITALS — BP 180/82 | HR 63 | Temp 98.9°F | Resp 16 | Ht 61.0 in | Wt 145.0 lb

## 2023-08-12 DIAGNOSIS — R051 Acute cough: Secondary | ICD-10-CM | POA: Insufficient documentation

## 2023-08-12 DIAGNOSIS — Z2089 Contact with and (suspected) exposure to other communicable diseases: Secondary | ICD-10-CM

## 2023-08-12 DIAGNOSIS — J441 Chronic obstructive pulmonary disease with (acute) exacerbation: Secondary | ICD-10-CM | POA: Diagnosis not present

## 2023-08-12 DIAGNOSIS — I517 Cardiomegaly: Secondary | ICD-10-CM | POA: Diagnosis not present

## 2023-08-12 DIAGNOSIS — R059 Cough, unspecified: Secondary | ICD-10-CM | POA: Diagnosis not present

## 2023-08-12 DIAGNOSIS — I10 Essential (primary) hypertension: Secondary | ICD-10-CM

## 2023-08-12 DIAGNOSIS — R918 Other nonspecific abnormal finding of lung field: Secondary | ICD-10-CM | POA: Diagnosis not present

## 2023-08-12 MED ORDER — AZITHROMYCIN 250 MG PO TABS
ORAL_TABLET | ORAL | 0 refills | Status: AC
  Filled 2023-08-12: qty 6, 5d supply, fill #0

## 2023-08-12 NOTE — Progress Notes (Signed)
Established patient visit   Patient: Adrienne Bennett   DOB: October 24, 1960   63 y.o. Female  MRN: [id number] Visit Date: 08/12/2023  Today's healthcare provider: Alfredia Ferguson, PA-C   Chief Complaint  Patient presents with   Cough    Complains of cough that started yesterday    Generalized Body Aches    Patient complains of body aches since yesterday   Subjective    Pt, with a history of copd, reports her husband has had walking pneumonia over the past few weeks. Yesterday she started with an increase in mucous, cough, slight sob.  No known fevers .  Medications: Outpatient Medications Prior to Visit  Medication Sig   albuterol (VENTOLIN HFA) 108 (90 Base) MCG/ACT inhaler Inhale 2 puffs into the lungs every 6 (six) hours as needed for wheezing or shortness of breath.   amLODipine (NORVASC) 10 MG tablet Take 1 tablet (10 mg total) by mouth daily.   apixaban (ELIQUIS) 5 MG TABS tablet Take 1 tablet (5 mg total) by mouth 2 (two) times daily.   atorvastatin (LIPITOR) 40 MG tablet Take 1 tablet (40 mg total) by mouth at bedtime.   baclofen (LIORESAL) 10 MG tablet Take 1 tablet (10 mg total) by mouth 2 (two) times daily.   BREO ELLIPTA 100-25 MCG/ACT AEPB INHALE 1 PUFF INTO THE LUNGS  ONCE DAILY   clonazePAM (KLONOPIN) 1 MG tablet Take 1 mg by mouth 2 (two) times daily.  rx by psychiatry   dapagliflozin propanediol (FARXIGA) 10 MG TABS tablet Take 1 tablet by mouth once daily   divalproex (DEPAKOTE) 250 MG DR tablet Take 250-500 mg by mouth See admin instructions. Take one tablet (250 mg) by mouth every morning and  (500 mg) at night   DULoxetine (CYMBALTA) 60 MG capsule Take 60 mg by mouth every morning.   Estradiol 10 MCG TABS vaginal tablet Place 1 tablet (10 mcg total) vaginally at bedtime for 14 days, THEN 1 tablet (10 mcg total) 2 (two) times a week.   furosemide (LASIX) 80 MG tablet Take 0.5 tablets (40 mg total) by mouth daily. 1rst attempt, patient needs and appt for  additional refills   glimepiride (AMARYL) 2 MG tablet Take 1 tablet by mouth daily before breakfast.   ipratropium-albuterol (DUONEB) 0.5-2.5 (3) MG/3ML SOLN Take 3 mLs by nebulization 3 (three) times daily as needed. (Patient taking differently: Take 3 mLs by nebulization 3 (three) times daily as needed (shortness of breath/wheezing).)   metoprolol tartrate (LOPRESSOR) 50 MG tablet Take 1 tablet (50 mg total) by mouth 2 (two) times daily.   oxymetazoline (AFRIN) 0.05 % nasal spray Place 1 spray into both nostrils 2 (two) times daily as needed for congestion (nose bleed).   potassium chloride (KLOR-CON) 10 MEQ tablet Take 1 tablet (10 mEq total) by mouth daily.   sulfamethoxazole-trimethoprim (BACTRIM DS) 800-160 MG tablet Take 1 tablet by mouth 2 (two) times daily.   triamcinolone lotion (KENALOG) 0.1 % Apply 1 Application topically 3 (three) times daily. (Patient taking differently: Apply 1 Application topically as needed (skin).)   No facility-administered medications prior to visit.    Review of Systems  Constitutional:  Positive for fatigue. Negative for fever.  Respiratory:  Positive for cough and shortness of breath.   Cardiovascular:  Negative for chest pain and leg swelling.  Gastrointestinal:  Negative for abdominal pain.  Neurological:  Negative for dizziness and headaches.       Objective    BP Marland Kitchen)  180/82   Pulse 63   Temp 98.9 F (37.2 C) (Oral)   Resp 16   Ht 5\' 1"  (1.549 m)   Wt 145 lb (65.8 kg)   LMP 08/24/2015 (Exact Date)   SpO2 96%   BMI 27.40 kg/m    Physical Exam Constitutional:      General: She is awake.     Appearance: She is well-developed.  HENT:     Head: Normocephalic.  Eyes:     Conjunctiva/sclera: Conjunctivae normal.  Cardiovascular:     Rate and Rhythm: Normal rate and regular rhythm.     Heart sounds: Normal heart sounds.  Pulmonary:     Effort: Pulmonary effort is normal.     Breath sounds: Normal breath sounds.  Skin:    General:  Skin is warm.  Neurological:     Mental Status: She is alert and oriented to person, place, and time.  Psychiatric:        Attention and Perception: Attention normal.        Mood and Affect: Mood normal.        Speech: Speech normal.        Behavior: Behavior is cooperative.      No results found for any visits on 08/12/23.  Assessment & Plan    COPD exacerbation (HCC) -     DG Chest 2 View; Future -     Azithromycin; Take 2 tablets (500 mg total) by mouth on day 1, and then take 1 tablet (250 mg total) daily on days 2 through 5.  Dispense: 6 tablet; Refill: 0  Exposure to pneumonia -     DG Chest 2 View; Future  Essential hypertension Assessment & Plan: Elevated in office Pt will monitor at home    Recommending chest xray to r/o pneumonia Rx zpack, continue inhalers   Return if symptoms worsen or fail to improve.     Alfredia Ferguson, PA-C  [facility] [phone] (phone) [phone] (fax)  [facility]

## 2023-08-13 ENCOUNTER — Encounter: Payer: Self-pay | Admitting: Physician Assistant

## 2023-08-13 ENCOUNTER — Ambulatory Visit: Payer: Medicare Other | Admitting: Family Medicine

## 2023-08-13 NOTE — Assessment & Plan Note (Signed)
Elevated in office Pt will monitor at home

## 2023-08-14 ENCOUNTER — Encounter: Payer: Self-pay | Admitting: Internal Medicine

## 2023-08-16 ENCOUNTER — Other Ambulatory Visit: Payer: Self-pay | Admitting: Internal Medicine

## 2023-08-16 ENCOUNTER — Encounter: Payer: Self-pay | Admitting: Physical Therapy

## 2023-08-16 ENCOUNTER — Ambulatory Visit: Payer: Medicare Other | Admitting: Physical Therapy

## 2023-08-16 DIAGNOSIS — M6281 Muscle weakness (generalized): Secondary | ICD-10-CM

## 2023-08-16 DIAGNOSIS — R29898 Other symptoms and signs involving the musculoskeletal system: Secondary | ICD-10-CM | POA: Diagnosis not present

## 2023-08-16 DIAGNOSIS — M5459 Other low back pain: Secondary | ICD-10-CM | POA: Diagnosis not present

## 2023-08-16 DIAGNOSIS — M542 Cervicalgia: Secondary | ICD-10-CM

## 2023-08-16 DIAGNOSIS — R2689 Other abnormalities of gait and mobility: Secondary | ICD-10-CM

## 2023-08-16 NOTE — Therapy (Signed)
OUTPATIENT PHYSICAL THERAPY THORACOLUMBAR TREATMENT   Patient Name: Adrienne Bennett MRN: [id number] DOB:17-Sep-1960, 63 y.o., female Today's Date: 08/16/2023  END OF SESSION:  PT End of Session - 08/16/23 1056     Visit Number 2    Number of Visits 13    Date for PT Re-Evaluation 09/20/23    Authorization Type [employer] MCR    Authorization Time Period 08/09/23 to 09/20/23    Progress Note Due on Visit 10    PT Start Time 1105   pt in bathroom   PT Stop Time 1145    PT Time Calculation (min) 40 min    Activity Tolerance Patient tolerated treatment well    Behavior During Therapy WFL for tasks assessed/performed              Past Medical History:  Diagnosis Date   Bipolar affective disorder (HCC)    PTSD, agoraphobiam ,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    GANGLION CYST 07/22/2010   Qualifier: Diagnosis of  By: [name] MD, Jose E.    GERD (gastroesophageal reflux disease) 05/23/2017   Hyperlipidemia 10/12/2012   IBS (irritable bowel syndrome)    chronic Diarrhea   Macular degeneration, dry    MIGRAINE HEADACHE 03/13/2010   Qualifier: Diagnosis of  By: [name] MD, [name].    Neck pain 02/03/2011   Osteoarthritis    "back; goes down my right leg" (09/01/2013)   Subclinical hyperthyroidism 02/26/2018   Thyromegaly 12/04/2014   Type II diabetes mellitus (HCC) 2007   Past Surgical History:  Procedure Laterality Date   CARPAL TUNNEL RELEASE Bilateral ~ 2000   cataract surgery Bilateral    TONSILLECTOMY  07/27/1968   TUBAL LIGATION  07/27/1994   Patient Active Problem List   Diagnosis Date Noted   Atrial ectopy 02/02/2023   Hypokalemia 02/02/2023   Hypomagnesemia 02/02/2023    Hypocalcemia 02/02/2023   Physical debility 08/03/2022   Chronic heart failure with preserved ejection fraction (HFpEF) (HCC) 08/03/2022   Exocrine pancreatic insufficiency 04/04/2022   Paroxysmal atrial fibrillation (HCC) 04/01/2022   Full incontinence of feces 02/15/2022   Chronic diarrhea 02/15/2022   Irritable bowel syndrome with diarrhea 02/15/2022   Nausea 02/15/2022   History of colonic polyps 02/15/2022   Chronic anticoagulation 02/15/2022   Change in bowel habits 02/15/2022   On home oxygen therapy 02/15/2022   Acute on chronic diastolic CHF (congestive heart failure) (HCC) 01/23/2022   Chronic respiratory failure (HCC) 01/23/2022   CKD (chronic kidney disease) stage 3, GFR 30-59 ml/min (HCC) 01/23/2022   Acute respiratory failure with hypoxia (HCC) 01/10/2022   AKI (acute kidney injury) (HCC) 01/09/2022   COPD (chronic obstructive pulmonary disease) (HCC) 01/08/2022   Dizziness 04/08/2021   Combined forms of age-related cataract of left eye 04/01/2021   High cholesterol    Congestive heart failure (HCC) 12/03/2020   Osteoarthritis    IBS (irritable bowel syndrome)    Depression    Macular degeneration, dry 02/01/2020   Diabetic retinopathy (HCC) 03/31/2018   Subclinical hyperthyroidism 02/26/2018   GERD (gastroesophageal reflux disease) 05/23/2017   DJD (degenerative joint disease) 03/04/2016   PCP NOTES >>>>>>>>>> 12/02/2015   Thyromegaly 12/04/2014   Bipolar affective  disorder [facility])    Annual physical exam 07/13/2012   Neck pain 02/03/2011   Essential hypertension 09/04/2010   Ganglion 07/22/2010   MIGRAINE HEADACHE 03/13/2010   DM (diabetes mellitus), secondary uncontrolled (HCC) 07/16/2008   Type II diabetes mellitus (HCC) 2007    PCP: Willow Ora MD   REFERRING PROVIDER: Juanda Chance, NP  REFERRING DIAG:  Diagnosis  M54.50,G89.29 (ICD-10-CM) - Chronic bilateral low back pain without sciatica  M54.2 (ICD-10-CM) - Cervicalgia    Rationale for  Evaluation and Treatment: Rehabilitation  THERAPY DIAG:  Other low back pain  Cervicalgia  Muscle weakness (generalized)  Other abnormalities of gait and mobility  Other symptoms and signs involving the musculoskeletal system  ONSET DATE: "about 3 weeks ago"   SUBJECTIVE:                                                                                                                                                                                           SUBJECTIVE STATEMENT:   Still having pain, now its in the shoulder and in the back and down my arm. Might have walking pneumonia they will be doing chest xray. Have been doing all of my HEP except for the curls and chin tucks. Have to carry a lot in my pocketbook and it gets very painful due to this.     EVAL: This pain has been around for about 3 weeks, I had to wear a back brace as a teenager due to scoliosis. My neck where it meets my spine I can feel the bones rubbing against each other and I can hear it sometimes. Its the front of the shoulder and down but in my back. I have pain where my spine meets my tailbone. My hips hurt a lot today, they never hurt. Tripped on cane and fell last week, skinned knees.    PERTINENT HISTORY:   See above   PAIN:  Are you having pain? Yes: NPRS scale: 8/10 Pain location: shoulder R  Pain description: "it feels like someone is operating on me without anesthsia"  Aggravating factors: wearing heavy purse  Relieving factors: tylenol (extra strength)  PRECAUTIONS: Fall  RED FLAGS: None   WEIGHT BEARING RESTRICTIONS: No  FALLS:  Has patient fallen in last 6 months? Yes. Number of falls 1; tripped over cane, no FOF "I'm used to it"   LIVING ENVIRONMENT: Lives with: lives with their family Lives in: House/apartment Stairs: two story home, has to go upstairs to bedroom/bathroom no chair lift  Has following equipment at home: Single point cane and home O2   OCCUPATION: disability    PLOF: Independent, Independent  with basic ADLs, Independent with gait, Independent with transfers, and Requires assistive device for independence  PATIENT GOALS: not hurt/pain control, be able to be more active and go on hikes   NEXT MD VISIT: Referring after PT   OBJECTIVE:  Note: Objective measures were completed at Evaluation unless otherwise noted.  DIAGNOSTIC FINDINGS:   FINDINGS: There is no evidence of lumbar spine fracture. Scoliosis of spine. Mild-to-moderate degenerative joint changes throughout lumbar spine with narrow intervertebral space, anterior spurring and facet joint sclerosis.   IMPRESSION: Mild-to-moderate degenerative joint changes throughout lumbar spine.  FINDINGS: Straightening of the cervical spine. Suboptimal visualization C7 and below. Normal prevertebral soft tissue thickness. Vertebral body heights are maintained. Moderate diffuse disc space narrowing C3 through C7. Dens and lateral masses are partially obscured by teeth   IMPRESSION: Straightening of the cervical spine with moderate diffuse disc space narrowing C3 through C7.    PATIENT SURVEYS:  FOTO 36, predicted 46 in 13 visits   COGNITION: Overall cognitive status: Within functional limits for tasks assessed      POSTURE: rounded shoulders, forward head, decreased lumbar lordosis, and increased thoracic kyphosis  PALPATION:   Tight but not tender to palpation  LUMBAR ROM:   AROM eval  Flexion Severe limitation   Extension Severe limitation   Right lateral flexion Severe limitation   Left lateral flexion Severe limitation   Right rotation Severe limitation   Left rotation Severe limitation   Cervical flexion  40*  Cervical extension 7*  Cervical lateral flexion  R 7* L 13*  Cervical rotation  R 33* L 30*   (Blank rows = not tested)    LOWER EXTREMITY MMT:    MMT Right eval Left eval  Hip flexion 3 3  Hip extension    Hip abduction 2  2  Hip adduction    Hip  internal rotation    Hip external rotation    Knee flexion    Knee extension 4 4  Ankle dorsiflexion 5 5  Ankle plantarflexion    Ankle inversion    Ankle eversion     (Blank rows = not tested)   Shoulder flexion 4/5 B, shoulder ABD R 4/5 L 3/5, biceps 3+/5, triceps 2/5     GAIT: Distance walked: in clinic distances  Assistive device utilized: Single point cane Level of assistance: Modified independence Comments: significant LLD noted from scoliosis   TREATMENT DATE:   08/16/23  TherEx  Nustep L5 x6 minutes BLEs only, dropped to L4 mid way thru Upper trap stretch 2x30 seconds B Cervical rotation ROM x10 B  Scap retractions x15  Levator stretches 2x30 seconds B    MHP on during cervical stretches/ROM x8 minutes    Education  Discussed and printed out an example laptop bag that would be light weight  and that she could wear messenger style to reduce shoulder pain Recommended [place] [employer] to upgrade to a [facility]       08/09/23  Eval, care planning, HEP   Seated LAQs red TB x5 B Seated single leg hip ABD x5 B red TB Bicep curls 2# x10 B Lumbar flexion stretch x30 seconds Chin tucks x10  PATIENT EDUCATION:  Education details: exam findings, POC, HEP  Person educated: Patient and Spouse Education method: Explanation, Demonstration, and Handouts Education comprehension: verbalized understanding, returned demonstration, verbal cues required, and needs further education  HOME EXERCISE PROGRAM:  Access Code: [email or web address] URL: [email or web address] Date: 08/16/2023 Prepared by: Nedra Hai  Exercises - Seated Knee Extension with Anchored Resistance  - 1 x daily - 7 x weekly - 2 sets - 10 reps - 2 seconds  hold - Seated Single Leg Hip Abduction with Resistance  - 1 x daily - 7 x weekly - 2 sets - 10  reps - 2 seconds  hold - Seated Flexion Stretch  - 2 x daily - 7 x weekly - 1 sets - 2 reps - 30 seconds hold - Seated Cervical Retraction  - 1-2 x daily - 7 x weekly - 1 sets - 5-10 reps - 2 seconds  hold - Seated Scapular Retraction  - 2 x daily - 7 x weekly - 1 sets - 10 reps - 2 seconds  hold  ASSESSMENT:  CLINICAL IMPRESSION:  Pt arrives today doing OK, still having a lot of pain especially in her shoulder. Worked on gentle mobility as tolerated, also tried pain management via moist heat modality today. Recommended laptop messenger bag for her, also adjusted HEP including reps for chin tucks. HEP updated as appropriate. Will continue to attempt gentle progressions as able. Pain 6-7/10 at EOS.      EVAL: Patient is a 63 y.o. F who was seen today for physical therapy evaluation and treatment for  Diagnosis  M54.50,G89.29 (ICD-10-CM) - Chronic bilateral low back pain without sciatica  M54.2 (ICD-10-CM) - Cervicalgia  . Exam with functional measures as above. Will make every effort to improve pain and improve QOL moving forward- might benefit from skilled water PT but she is not interested in this at eval.   OBJECTIVE IMPAIRMENTS: Abnormal gait, decreased activity tolerance, decreased balance, decreased mobility, difficulty walking, decreased ROM, decreased strength, increased fascial restrictions, increased muscle spasms, impaired flexibility, improper body mechanics, postural dysfunction, and pain.   ACTIVITY LIMITATIONS: carrying, lifting, sitting, standing, sleeping, stairs, transfers, hygiene/grooming, and locomotion level  PARTICIPATION LIMITATIONS: meal prep, cleaning, laundry, shopping, community activity, and yard work  PERSONAL FACTORS: Age, Behavior pattern, Education, Fitness, Past/current experiences, Profession, Social background, and Time since onset of injury/illness/exacerbation are also affecting patient's functional outcome.   REHAB POTENTIAL: Fair chronicity of  impairments, irritability/unpredictability of pain   CLINICAL DECISION MAKING: Evolving/moderate complexity  EVALUATION COMPLEXITY: Moderate   GOALS: Goals reviewed with patient? No  SHORT TERM GOALS: Target date: 08/30/2023    Will be compliant with appropriate progressive HEP  Baseline: Goal status: INITIAL  2.  Lumbar AROM to be no more than moderately limited on all planes of motion   Baseline:  Goal status: INITIAL  3.  Cervical AROM to have improved by 10 degrees all planes of motion  Baseline:  Goal status: INITIAL   LONG TERM GOALS: Target date: 09/20/2023    MMT to have improved by one grade in all weak groups  Baseline:  Goal status: INITIAL  2.  Pain to be no more than 5/10 at worst  Baseline:  Goal status: INITIAL  3.  Will be able to tolerate community distance ambulation without increase in pain  Baseline:  Goal status: INITIAL  4.  FOTO score to have met or exceeded goal level by time of DC  Baseline:  Goal status: INITIAL  5.  Will be  able to perform exercise as desired/appropriate in the gym without significant increase in pain  Baseline:  Goal status: INITIAL    PLAN:  PT FREQUENCY: 1-2x/week  PT DURATION: 6 weeks  PLANNED INTERVENTIONS: 97164- PT Re-evaluation, 97110-Therapeutic exercises, 97530- Therapeutic activity, 97112- Neuromuscular re-education, 97535- Self Care, 91478- Manual therapy, 97760- Orthotic Fit/training, 97014- Electrical stimulation (unattended), Taping, Dry Needling, Cryotherapy, and Moist heat.  PLAN FOR NEXT SESSION: gentle mobility as tolerated, manual and DN as appropriate. Modalities as desired/appropriate. Refusing water PT for now   Nedra Hai, PT, DPT 08/16/23 11:45 AM    Date of referral: 07/30/23 Referring provider: Ellin Goodie NP  Referring diagnosis?  Diagnosis  M54.50,G89.29 (ICD-10-CM) - Chronic bilateral low back pain without sciatica  M54.2 (ICD-10-CM) - Cervicalgia   Treatment  diagnosis? (if different than referring diagnosis)   M54.59, M54.2, M62.81, R26.89, R29.898  What was this (referring dx) caused by? Ongoing Issue  Ashby Dawes of Condition: Chronic (continuous duration > 3 months)   Laterality: Both  Current Functional Measure Score: FOTO 36  Objective measurements identify impairments when they are compared to normal values, the uninvolved extremity, and prior level of function.  [x]  Yes  []  No  Objective assessment of functional ability: Moderate functional limitations   Briefly describe symptoms: pt reports acute onset of pain, however impairments are likely chronic given PMH and severity of impairments of objective measures found today   How did symptoms start: idiopathically   Average pain intensity:  Last 24 hours: 8/10  Past week: 8/10  How often does the pt experience symptoms? Constantly  How much have the symptoms interfered with usual daily activities? Quite a bit  How has condition changed since care began at this facility? No change  In general, how is the patients overall health? Fair   BACK PAIN (STarT Back Screening Tool) Has pain spread down the leg(s) at some time in the last 2 weeks? no Has there been pain in the shoulder or neck at some time in the last 2 weeks? yes Has the pt only walked short distances because of back pain? Yes  Has patient dressed more slowly because of back pain in the past 2 weeks? It depends on the day- sometimes yes, sometimes no  Does patient think it's not safe for a person with this condition to be physically active? yes Does patient have worrying thoughts a lot of the time? Yes  Does patient feel back pain is terrible and will never get any better? Yes Has patient stopped enjoying things they usually enjoy? yes

## 2023-08-17 ENCOUNTER — Other Ambulatory Visit: Payer: Self-pay

## 2023-08-17 DIAGNOSIS — H353131 Nonexudative age-related macular degeneration, bilateral, early dry stage: Secondary | ICD-10-CM | POA: Diagnosis not present

## 2023-08-17 MED ORDER — POTASSIUM CHLORIDE ER 10 MEQ PO TBCR: 10.0000 meq | EXTENDED_RELEASE_TABLET | Freq: Every day | ORAL | 0 refills | Status: DC

## 2023-08-18 ENCOUNTER — Other Ambulatory Visit: Payer: Self-pay | Admitting: Internal Medicine

## 2023-08-18 MED ORDER — APIXABAN 5 MG PO TABS: 5.0000 mg | ORAL_TABLET | Freq: Two times a day (BID) | ORAL | 0 refills | Status: DC

## 2023-08-18 NOTE — Telephone Encounter (Signed)
Copied from CRM [id number]. Topic: Clinical - Medication Refill >> Aug 18, 2023 10:21 AM Hector Shade B wrote: Most Recent Primary Care Visit:  Provider: Alfredia Ferguson  Department: [facility]  Visit Type: ACUTE  Date: 08/12/2023  Medication: apixaban (ELIQUIS) 5 MG TABS tablet  Has the patient contacted their pharmacy? Yes (Agent: If no, request that the patient contact the pharmacy for the refill. If patient does not wish to contact the pharmacy document the reason why and proceed with request.) (Agent: If yes, when and what did the pharmacy advise?) Stated last refill and needed to make appointment   Is this the correct pharmacy for this prescription? Yes If no, delete pharmacy and type the correct one.  This is the patient's preferred pharmacy:   [facility]  [address] [place], [place], [place] [address] Phone: [phone]  [facility] - [place], [place] - [address] [place] [address] Phone: [phone] Fax: [phone]   Has the prescription been filled recently? Yes  Is the patient out of the medication? No she has 22 left take 2 a day for the next 11 days  Has the patient been seen for an appointment in the last year OR does the patient have an upcoming appointment? Yes  Can we respond through MyChart? Yes  Agent: Please be advised that Rx refills may take up to 3 business days. We ask that you follow-up with your pharmacy.

## 2023-08-19 ENCOUNTER — Ambulatory Visit: Payer: Medicare Other | Admitting: Physical Therapy

## 2023-08-19 ENCOUNTER — Encounter: Payer: Self-pay | Admitting: Physical Therapy

## 2023-08-19 DIAGNOSIS — R29898 Other symptoms and signs involving the musculoskeletal system: Secondary | ICD-10-CM | POA: Diagnosis not present

## 2023-08-19 DIAGNOSIS — M542 Cervicalgia: Secondary | ICD-10-CM | POA: Diagnosis not present

## 2023-08-19 DIAGNOSIS — M6281 Muscle weakness (generalized): Secondary | ICD-10-CM

## 2023-08-19 DIAGNOSIS — R2689 Other abnormalities of gait and mobility: Secondary | ICD-10-CM

## 2023-08-19 DIAGNOSIS — M5459 Other low back pain: Secondary | ICD-10-CM | POA: Diagnosis not present

## 2023-08-19 NOTE — Therapy (Signed)
OUTPATIENT PHYSICAL THERAPY THORACOLUMBAR TREATMENT   Patient Name: Adrienne Bennett MRN: [id number] DOB:1960-11-09, 63 y.o., female Today's Date: 08/19/2023  END OF SESSION:  PT End of Session - 08/19/23 0959     Visit Number 3    Number of Visits 13    Date for PT Re-Evaluation 09/20/23    Authorization Type [employer] MCR    Authorization Time Period 08/09/23 to 09/20/23    Progress Note Due on Visit 10    PT Start Time 1000    PT Stop Time 1043    PT Time Calculation (min) 43 min    Activity Tolerance Patient tolerated treatment well    Behavior During Therapy WFL for tasks assessed/performed               Past Medical History:  Diagnosis Date   Bipolar affective disorder (HCC)    PTSD, agoraphobiam ,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    GANGLION CYST 07/22/2010   Qualifier: Diagnosis of  By: [name] MD, Jose E.    GERD (gastroesophageal reflux disease) 05/23/2017   Hyperlipidemia 10/12/2012   IBS (irritable bowel syndrome)    chronic Diarrhea   Macular degeneration, dry    MIGRAINE HEADACHE 03/13/2010   Qualifier: Diagnosis of  By: [name] MD, [name].    Neck pain 02/03/2011   Osteoarthritis    "back; goes down my right leg" (09/01/2013)   Subclinical hyperthyroidism 02/26/2018   Thyromegaly 12/04/2014   Type II diabetes mellitus (HCC) 2007   Past Surgical History:  Procedure Laterality Date   CARPAL TUNNEL RELEASE Bilateral ~ 2000   cataract surgery Bilateral    TONSILLECTOMY  07/27/1968   TUBAL LIGATION  07/27/1994   Patient Active Problem List   Diagnosis Date Noted   Atrial ectopy 02/02/2023   Hypokalemia 02/02/2023   Hypomagnesemia 02/02/2023   Hypocalcemia  02/02/2023   Physical debility 08/03/2022   Chronic heart failure with preserved ejection fraction (HFpEF) (HCC) 08/03/2022   Exocrine pancreatic insufficiency 04/04/2022   Paroxysmal atrial fibrillation (HCC) 04/01/2022   Full incontinence of feces 02/15/2022   Chronic diarrhea 02/15/2022   Irritable bowel syndrome with diarrhea 02/15/2022   Nausea 02/15/2022   History of colonic polyps 02/15/2022   Chronic anticoagulation 02/15/2022   Change in bowel habits 02/15/2022   On home oxygen therapy 02/15/2022   Acute on chronic diastolic CHF (congestive heart failure) (HCC) 01/23/2022   Chronic respiratory failure (HCC) 01/23/2022   CKD (chronic kidney disease) stage 3, GFR 30-59 ml/min (HCC) 01/23/2022   Acute respiratory failure with hypoxia (HCC) 01/10/2022   AKI (acute kidney injury) (HCC) 01/09/2022   COPD (chronic obstructive pulmonary disease) (HCC) 01/08/2022   Dizziness 04/08/2021   Combined forms of age-related cataract of left eye 04/01/2021   High cholesterol    Congestive heart failure (HCC) 12/03/2020   Osteoarthritis    IBS (irritable bowel syndrome)    Depression    Macular degeneration, dry 02/01/2020   Diabetic retinopathy (HCC) 03/31/2018   Subclinical hyperthyroidism 02/26/2018   GERD (gastroesophageal reflux disease) 05/23/2017   DJD (degenerative joint disease) 03/04/2016   PCP NOTES >>>>>>>>>> 12/02/2015   Thyromegaly 12/04/2014   Bipolar affective disorder (HCC)  Annual physical exam 07/13/2012   Neck pain 02/03/2011   Essential hypertension 09/04/2010   Ganglion 07/22/2010   MIGRAINE HEADACHE 03/13/2010   DM (diabetes mellitus), secondary uncontrolled (HCC) 07/16/2008   Type II diabetes mellitus (HCC) 2007    PCP: Willow Ora MD   REFERRING PROVIDER: Juanda Chance, NP  REFERRING DIAG:  Diagnosis  M54.50,G89.29 (ICD-10-CM) - Chronic bilateral low back pain without sciatica  M54.2 (ICD-10-CM) - Cervicalgia    Rationale for Evaluation and  Treatment: Rehabilitation  THERAPY DIAG:  Other low back pain  Cervicalgia  Muscle weakness (generalized)  Other abnormalities of gait and mobility  Other symptoms and signs involving the musculoskeletal system  ONSET DATE: "about 3 weeks ago"   SUBJECTIVE:                                                                                                                                                                                           SUBJECTIVE STATEMENT: "I'm in pain."  Reports pain is a 7/10 currently "because I took a tylenol."   EVAL: This pain has been around for about 3 weeks, I had to wear a back brace as a teenager due to scoliosis. My neck where it meets my spine I can feel the bones rubbing against each other and I can hear it sometimes. Its the front of the shoulder and down but in my back. I have pain where my spine meets my tailbone. My hips hurt a lot today, they never hurt. Tripped on cane and fell last week, skinned knees.    PERTINENT HISTORY:   See above   PAIN:  Are you having pain? Yes: NPRS scale: 8/10 Pain location: shoulder R  Pain description: "it feels like someone is operating on me without anesthsia"  Aggravating factors: wearing heavy purse  Relieving factors: tylenol (extra strength)  PRECAUTIONS: Fall  RED FLAGS: None   WEIGHT BEARING RESTRICTIONS: No  FALLS:  Has patient fallen in last 6 months? Yes. Number of falls 1; tripped over cane, no FOF "I'm used to it"   LIVING ENVIRONMENT: Lives with: lives with their family Lives in: House/apartment Stairs: two story home, has to go upstairs to bedroom/bathroom no chair lift  Has following equipment at home: Single point cane and home O2   OCCUPATION: disability   PLOF: Independent, Independent with basic ADLs, Independent with gait, Independent with transfers, and Requires assistive device for independence  PATIENT GOALS: not hurt/pain control, be able to be more active and go on  hikes   NEXT MD VISIT: Referring after PT   OBJECTIVE:  Note: Objective measures were completed at Evaluation  unless otherwise noted.  DIAGNOSTIC FINDINGS:   FINDINGS: There is no evidence of lumbar spine fracture. Scoliosis of spine. Mild-to-moderate degenerative joint changes throughout lumbar spine with narrow intervertebral space, anterior spurring and facet joint sclerosis.   IMPRESSION: Mild-to-moderate degenerative joint changes throughout lumbar spine.  FINDINGS: Straightening of the cervical spine. Suboptimal visualization C7 and below. Normal prevertebral soft tissue thickness. Vertebral body heights are maintained. Moderate diffuse disc space narrowing C3 through C7. Dens and lateral masses are partially obscured by teeth   IMPRESSION: Straightening of the cervical spine with moderate diffuse disc space narrowing C3 through C7.    PATIENT SURVEYS:  EVAL: FOTO 36, predicted 46 in 13 visits   COGNITION: Overall cognitive status: Within functional limits for tasks assessed      POSTURE: rounded shoulders, forward head, decreased lumbar lordosis, and increased thoracic kyphosis  PALPATION:   Tight but not tender to palpation  LUMBAR ROM:   AROM eval  Flexion Severe limitation   Extension Severe limitation   Right lateral flexion Severe limitation   Left lateral flexion Severe limitation   Right rotation Severe limitation   Left rotation Severe limitation   Cervical flexion  40*  Cervical extension 7*  Cervical lateral flexion  R 7* L 13*  Cervical rotation  R 33* L 30*   (Blank rows = not tested)    LOWER EXTREMITY MMT:    MMT Right eval Left eval  Hip flexion 3 3  Hip extension    Hip abduction 2  2  Hip adduction    Hip internal rotation    Hip external rotation    Knee flexion    Knee extension 4 4  Ankle dorsiflexion 5 5  Ankle plantarflexion    Ankle inversion    Ankle eversion     (Blank rows = not tested)   Shoulder flexion  4/5 B, shoulder ABD R 4/5 L 3/5, biceps 3+/5, triceps 2/5     GAIT: Distance walked: in clinic distances  Assistive device utilized: Single point cane Level of assistance: Modified independence Comments: significant LLD noted from scoliosis   TREATMENT DATE:  08/19/23 TherEx Nustep L5 x 8 minutes LEs and LUE Posterior capsule stretch on Rt 2x20 sec; mod cues for technique Scapular retraction x10 reps; 5 sec hold  Manual STM with compression to Rt infraspinatus; skilled palpation and monitoring of soft tissue during DN Trigger Point Dry Needling  Initial Treatment: Pt instructed on Dry Needling rational, procedures, and possible side effects. Pt instructed to expect mild to moderate muscle soreness later in the day and/or into the next day.  Pt instructed in methods to reduce muscle soreness. Pt instructed to continue prescribed HEP. Because Dry Needling was performed over or adjacent to a lung field, pt was educated on S/S of pneumothorax and to seek immediate medical attention should they occur.  Patient was educated on signs and symptoms of infection and other risk factors and advised to seek medical attention should they occur.  Patient verbalized understanding of these instructions and education.   Patient Verbal Consent Given: Yes Education Handout Provided: Yes Muscles Treated: Rt infraspinatus Electrical Stimulation Performed: No Treatment Response/Outcome: twitch responses noted; mild soreness reported    08/16/23 TherEx Nustep L5 x6 minutes BLEs only, dropped to L4 mid way thru Upper trap stretch 2x30 seconds B Cervical rotation ROM x10 B  Scap retractions x15  Levator stretches 2x30 seconds B   MHP on during cervical stretches/ROM x8 minutes  Education Discussed and  printed out an example laptop bag that would be light weight  and that she could wear messenger style to reduce shoulder pain Recommended [place] [employer] to upgrade to a [facility]      08/09/23 Eval, care planning, HEP   Seated LAQs red TB x5 B Seated single leg hip ABD x5 B red TB Bicep curls 2# x10 B Lumbar flexion stretch x30 seconds Chin tucks x10                                                                                                                                    PATIENT EDUCATION:  Education details: exam findings, POC, HEP  Person educated: Patient and Spouse Education method: Explanation, Facilities manager, and Handouts Education comprehension: verbalized understanding, returned demonstration, verbal cues required, and needs further education  HOME EXERCISE PROGRAM:  Access Code: [email or web address] URL: [email or web address] Date: 08/16/2023 Prepared by: Nedra Hai  Exercises - Seated Knee Extension with Anchored Resistance  - 1 x daily - 7 x weekly - 2 sets - 10 reps - 2 seconds  hold - Seated Single Leg Hip Abduction with Resistance  - 1 x daily - 7 x weekly - 2 sets - 10 reps - 2 seconds  hold - Seated Flexion Stretch  - 2 x daily - 7 x weekly - 1 sets - 2 reps - 30 seconds hold - Seated Cervical Retraction  - 1-2 x daily - 7 x weekly - 1 sets - 5-10 reps - 2 seconds  hold - Seated Scapular Retraction  - 2 x daily - 7 x weekly - 1 sets - 10 reps - 2 seconds  hold  ASSESSMENT:  CLINICAL IMPRESSION: Pt continues to have high reports of pain at this time, with trial of dry needling and manual therapy today to see if this is beneficial in helping her pain.  All goals are ongoing at this time, will continue to benefit from PT to maximize function.    EVAL: Patient is a 63 y.o. F who was seen today for physical therapy evaluation and treatment for  Diagnosis  M54.50,G89.29 (ICD-10-CM) - Chronic bilateral low back pain without sciatica  M54.2 (ICD-10-CM) - Cervicalgia  . Exam with functional measures as above. Will make every effort to improve pain and improve QOL moving forward- might benefit from skilled water PT but she is not  interested in this at eval.   OBJECTIVE IMPAIRMENTS: Abnormal gait, decreased activity tolerance, decreased balance, decreased mobility, difficulty walking, decreased ROM, decreased strength, increased fascial restrictions, increased muscle spasms, impaired flexibility, improper body mechanics, postural dysfunction, and pain.   ACTIVITY LIMITATIONS: carrying, lifting, sitting, standing, sleeping, stairs, transfers, hygiene/grooming, and locomotion level  PARTICIPATION LIMITATIONS: meal prep, cleaning, laundry, shopping, community activity, and yard work  PERSONAL FACTORS: Age, Behavior pattern, Education, Fitness, Past/current experiences, Profession, Social background, and Time since onset of injury/illness/exacerbation are also affecting patient's functional outcome.   REHAB  POTENTIAL: Fair chronicity of impairments, irritability/unpredictability of pain   CLINICAL DECISION MAKING: Evolving/moderate complexity  EVALUATION COMPLEXITY: Moderate   GOALS: Goals reviewed with patient? No  SHORT TERM GOALS: Target date: 08/30/2023    Will be compliant with appropriate progressive HEP  Baseline: Goal status: INITIAL  2.  Lumbar AROM to be no more than moderately limited on all planes of motion   Baseline:  Goal status: INITIAL  3.  Cervical AROM to have improved by 10 degrees all planes of motion  Baseline:  Goal status: INITIAL   LONG TERM GOALS: Target date: 09/20/2023    MMT to have improved by one grade in all weak groups  Baseline:  Goal status: INITIAL  2.  Pain to be no more than 5/10 at worst  Baseline:  Goal status: INITIAL  3.  Will be able to tolerate community distance ambulation without increase in pain  Baseline:  Goal status: INITIAL  4.  FOTO score to have met or exceeded goal level by time of DC  Baseline:  Goal status: INITIAL  5.  Will be able to perform exercise as desired/appropriate in the gym without significant increase in pain  Baseline:   Goal status: INITIAL    PLAN:  PT FREQUENCY: 1-2x/week  PT DURATION: 6 weeks  PLANNED INTERVENTIONS: 97164- PT Re-evaluation, 97110-Therapeutic exercises, 97530- Therapeutic activity, 97112- Neuromuscular re-education, 97535- Self Care, 16109- Manual therapy, 97760- Orthotic Fit/training, 97014- Electrical stimulation (unattended), Taping, Dry Needling, Cryotherapy, and Moist heat.  PLAN FOR NEXT SESSION: assess response to DN,  gentle mobility as tolerated, manual and DN as appropriate. Modalities as desired/appropriate. Refusing water PT for now   Clarita Crane, PT, DPT 08/19/23 11:06 AM     Date of referral: 07/30/23 Referring provider: Ellin Goodie NP  Referring diagnosis?  Diagnosis  M54.50,G89.29 (ICD-10-CM) - Chronic bilateral low back pain without sciatica  M54.2 (ICD-10-CM) - Cervicalgia   Treatment diagnosis? (if different than referring diagnosis)   M54.59, M54.2, M62.81, R26.89, R29.898  What was this (referring dx) caused by? Ongoing Issue  Ashby Dawes of Condition: Chronic (continuous duration > 3 months)   Laterality: Both  Current Functional Measure Score: FOTO 36  Objective measurements identify impairments when they are compared to normal values, the uninvolved extremity, and prior level of function.  [x]  Yes  []  No  Objective assessment of functional ability: Moderate functional limitations   Briefly describe symptoms: pt reports acute onset of pain, however impairments are likely chronic given PMH and severity of impairments of objective measures found today   How did symptoms start: idiopathically   Average pain intensity:  Last 24 hours: 8/10  Past week: 8/10  How often does the pt experience symptoms? Constantly  How much have the symptoms interfered with usual daily activities? Quite a bit  How has condition changed since care began at this facility? No change  In general, how is the patients overall health? Fair   BACK PAIN  (STarT Back Screening Tool) Has pain spread down the leg(s) at some time in the last 2 weeks? no Has there been pain in the shoulder or neck at some time in the last 2 weeks? yes Has the pt only walked short distances because of back pain? Yes  Has patient dressed more slowly because of back pain in the past 2 weeks? It depends on the day- sometimes yes, sometimes no  Does patient think it's not safe for a person with this condition to be physically active? yes Does  patient have worrying thoughts a lot of the time? Yes  Does patient feel back pain is terrible and will never get any better? Yes Has patient stopped enjoying things they usually enjoy? yes

## 2023-08-19 NOTE — Patient Instructions (Signed)

## 2023-08-24 ENCOUNTER — Encounter: Payer: Self-pay | Admitting: Physical Therapy

## 2023-08-24 ENCOUNTER — Ambulatory Visit: Payer: Medicare Other | Admitting: Physical Therapy

## 2023-08-24 ENCOUNTER — Other Ambulatory Visit: Payer: Self-pay | Admitting: Cardiology

## 2023-08-24 DIAGNOSIS — M542 Cervicalgia: Secondary | ICD-10-CM

## 2023-08-24 DIAGNOSIS — M5459 Other low back pain: Secondary | ICD-10-CM

## 2023-08-24 DIAGNOSIS — R2689 Other abnormalities of gait and mobility: Secondary | ICD-10-CM

## 2023-08-24 DIAGNOSIS — R29898 Other symptoms and signs involving the musculoskeletal system: Secondary | ICD-10-CM | POA: Diagnosis not present

## 2023-08-24 DIAGNOSIS — M6281 Muscle weakness (generalized): Secondary | ICD-10-CM

## 2023-08-24 NOTE — Therapy (Signed)
OUTPATIENT PHYSICAL THERAPY THORACOLUMBAR TREATMENT   Patient Name: Adrienne Bennett MRN: [id number] DOB:07-09-1961, 63 y.o., female Today's Date: 08/24/2023  END OF SESSION:  PT End of Session - 08/24/23 1059     Visit Number 4    Number of Visits 13    Date for PT Re-Evaluation 09/20/23    Authorization Type [employer] MCR    Authorization Time Period 08/09/23 to 09/20/23    Progress Note Due on Visit 10    PT Start Time 1058    PT Stop Time 1144    PT Time Calculation (min) 46 min    Activity Tolerance Patient tolerated treatment well    Behavior During Therapy WFL for tasks assessed/performed                Past Medical History:  Diagnosis Date   Bipolar affective disorder (HCC)    PTSD, agoraphobiam ,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    GANGLION CYST 07/22/2010   Qualifier: Diagnosis of  By: [name] MD, Jose E.    GERD (gastroesophageal reflux disease) 05/23/2017   Hyperlipidemia 10/12/2012   IBS (irritable bowel syndrome)    chronic Diarrhea   Macular degeneration, dry    MIGRAINE HEADACHE 03/13/2010   Qualifier: Diagnosis of  By: [name] MD, [name].    Neck pain 02/03/2011   Osteoarthritis    "back; goes down my right leg" (09/01/2013)   Subclinical hyperthyroidism 02/26/2018   Thyromegaly 12/04/2014   Type II diabetes mellitus (HCC) 2007   Past Surgical History:  Procedure Laterality Date   CARPAL TUNNEL RELEASE Bilateral ~ 2000   cataract surgery Bilateral    TONSILLECTOMY  07/27/1968   TUBAL LIGATION  07/27/1994   Patient Active Problem List   Diagnosis Date Noted   Atrial ectopy 02/02/2023   Hypokalemia 02/02/2023   Hypomagnesemia 02/02/2023    Hypocalcemia 02/02/2023   Physical debility 08/03/2022   Chronic heart failure with preserved ejection fraction (HFpEF) (HCC) 08/03/2022   Exocrine pancreatic insufficiency 04/04/2022   Paroxysmal atrial fibrillation (HCC) 04/01/2022   Full incontinence of feces 02/15/2022   Chronic diarrhea 02/15/2022   Irritable bowel syndrome with diarrhea 02/15/2022   Nausea 02/15/2022   History of colonic polyps 02/15/2022   Chronic anticoagulation 02/15/2022   Change in bowel habits 02/15/2022   On home oxygen therapy 02/15/2022   Acute on chronic diastolic CHF (congestive heart failure) (HCC) 01/23/2022   Chronic respiratory failure (HCC) 01/23/2022   CKD (chronic kidney disease) stage 3, GFR 30-59 ml/min (HCC) 01/23/2022   Acute respiratory failure with hypoxia (HCC) 01/10/2022   AKI (acute kidney injury) (HCC) 01/09/2022   COPD (chronic obstructive pulmonary disease) (HCC) 01/08/2022   Dizziness 04/08/2021   Combined forms of age-related cataract of left eye 04/01/2021   High cholesterol    Congestive heart failure (HCC) 12/03/2020   Osteoarthritis    IBS (irritable bowel syndrome)    Depression    Macular degeneration, dry 02/01/2020   Diabetic retinopathy (HCC) 03/31/2018   Subclinical hyperthyroidism 02/26/2018   GERD (gastroesophageal reflux disease) 05/23/2017   DJD (degenerative joint disease) 03/04/2016   PCP NOTES >>>>>>>>>> 12/02/2015   Thyromegaly 12/04/2014   Bipolar affective disorder (HCC)  Annual physical exam 07/13/2012   Neck pain 02/03/2011   Essential hypertension 09/04/2010   Ganglion 07/22/2010   MIGRAINE HEADACHE 03/13/2010   DM (diabetes mellitus), secondary uncontrolled (HCC) 07/16/2008   Type II diabetes mellitus (HCC) 2007    PCP: Willow Ora MD   REFERRING PROVIDER: Juanda Chance, NP  REFERRING DIAG:  Diagnosis  M54.50,G89.29 (ICD-10-CM) - Chronic bilateral low back pain without sciatica  M54.2 (ICD-10-CM) - Cervicalgia    Rationale for  Evaluation and Treatment: Rehabilitation  THERAPY DIAG:  Other low back pain  Cervicalgia  Muscle weakness (generalized)  Other abnormalities of gait and mobility  Other symptoms and signs involving the musculoskeletal system  ONSET DATE: "about 3 weeks ago"   SUBJECTIVE:                                                                                                                                                                                           SUBJECTIVE STATEMENT: Pain is "a solid 8."  Reports she felt great after DN for about 3 hours, then the pain returned, and "now I can't sleep on that side at all."  EVAL: This pain has been around for about 3 weeks, I had to wear a back brace as a teenager due to scoliosis. My neck where it meets my spine I can feel the bones rubbing against each other and I can hear it sometimes. Its the front of the shoulder and down but in my back. I have pain where my spine meets my tailbone. My hips hurt a lot today, they never hurt. Tripped on cane and fell last week, skinned knees.    PERTINENT HISTORY:   See above   PAIN:  Are you having pain? Yes: NPRS scale: 8/10 Pain location: shoulder R  Pain description: "it feels like someone is operating on me without anesthsia"  Aggravating factors: wearing heavy purse  Relieving factors: tylenol (extra strength)  PRECAUTIONS: Fall  RED FLAGS: None   WEIGHT BEARING RESTRICTIONS: No  FALLS:  Has patient fallen in last 6 months? Yes. Number of falls 1; tripped over cane, no FOF "I'm used to it"   LIVING ENVIRONMENT: Lives with: lives with their family Lives in: House/apartment Stairs: two story home, has to go upstairs to bedroom/bathroom no chair lift  Has following equipment at home: Single point cane and home O2   OCCUPATION: disability   PLOF: Independent, Independent with basic ADLs, Independent with gait, Independent with transfers, and Requires assistive device for  independence  PATIENT GOALS: not hurt/pain control, be able to be more active and go on hikes   NEXT MD VISIT:  Referring after PT   OBJECTIVE:  Note: Objective measures were completed at Evaluation unless otherwise noted.  DIAGNOSTIC FINDINGS:   FINDINGS: There is no evidence of lumbar spine fracture. Scoliosis of spine. Mild-to-moderate degenerative joint changes throughout lumbar spine with narrow intervertebral space, anterior spurring and facet joint sclerosis.   IMPRESSION: Mild-to-moderate degenerative joint changes throughout lumbar spine.  FINDINGS: Straightening of the cervical spine. Suboptimal visualization C7 and below. Normal prevertebral soft tissue thickness. Vertebral body heights are maintained. Moderate diffuse disc space narrowing C3 through C7. Dens and lateral masses are partially obscured by teeth   IMPRESSION: Straightening of the cervical spine with moderate diffuse disc space narrowing C3 through C7.    PATIENT SURVEYS:  EVAL: FOTO 36, predicted 46 in 13 visits   COGNITION: Overall cognitive status: Within functional limits for tasks assessed      POSTURE: rounded shoulders, forward head, decreased lumbar lordosis, and increased thoracic kyphosis  PALPATION:   Tight but not tender to palpation  LUMBAR ROM:   AROM eval  Flexion Severe limitation   Extension Severe limitation   Right lateral flexion Severe limitation   Left lateral flexion Severe limitation   Right rotation Severe limitation   Left rotation Severe limitation   Cervical flexion  40*  Cervical extension 7*  Cervical lateral flexion  R 7* L 13*  Cervical rotation  R 33* L 30*   (Blank rows = not tested)    LOWER EXTREMITY MMT:    MMT Right eval Left eval  Hip flexion 3 3  Hip extension    Hip abduction 2  2  Hip adduction    Hip internal rotation    Hip external rotation    Knee flexion    Knee extension 4 4  Ankle dorsiflexion 5 5  Ankle plantarflexion     Ankle inversion    Ankle eversion     (Blank rows = not tested)  EVAL Shoulder flexion 4/5 B, shoulder ABD R 4/5 L 3/5, biceps 3+/5, triceps 2/5   08/24/23:  Rt shoulder flexion 3+/5; abduction 3/5    GAIT: Distance walked: in clinic distances  Assistive device utilized: Single point cane Level of assistance: Modified independence Comments: significant LLD noted from scoliosis   TREATMENT DATE:  08/24/23 TherEx Nustep L6, 5 x 8 minutes LEs and LUE  Modalities Cervical traction x 10 min; 15#/10# with 60 sec/20 sec pull.    Self Care Long discussion with pt about current progress and plan of care.  She's tried exercise as well as several other modalities without improvement in pain.  Trial of traction today to see if this is helpful and discussed possible return to see NP should symptoms not improve.   08/19/23 TherEx Nustep L5 x 8 minutes LEs and LUE Posterior capsule stretch on Rt 2x20 sec; mod cues for technique Scapular retraction x10 reps; 5 sec hold  Manual STM with compression to Rt infraspinatus; skilled palpation and monitoring of soft tissue during DN Trigger Point Dry Needling  Initial Treatment: Pt instructed on Dry Needling rational, procedures, and possible side effects. Pt instructed to expect mild to moderate muscle soreness later in the day and/or into the next day.  Pt instructed in methods to reduce muscle soreness. Pt instructed to continue prescribed HEP. Because Dry Needling was performed over or adjacent to a lung field, pt was educated on S/S of pneumothorax and to seek immediate medical attention should they occur.  Patient was educated on signs and symptoms of  infection and other risk factors and advised to seek medical attention should they occur.  Patient verbalized understanding of these instructions and education.   Patient Verbal Consent Given: Yes Education Handout Provided: Yes Muscles Treated: Rt infraspinatus Electrical Stimulation  Performed: No Treatment Response/Outcome: twitch responses noted; mild soreness reported    08/16/23 TherEx Nustep L5 x6 minutes BLEs only, dropped to L4 mid way thru Upper trap stretch 2x30 seconds B Cervical rotation ROM x10 B  Scap retractions x15  Levator stretches 2x30 seconds B   MHP on during cervical stretches/ROM x8 minutes  Education Discussed and printed out an example laptop bag that would be light weight  and that she could wear messenger style to reduce shoulder pain Recommended [place] [employer] to upgrade to a [facility]     08/09/23 Eval, care planning, HEP   Seated LAQs red TB x5 B Seated single leg hip ABD x5 B red TB Bicep curls 2# x10 B Lumbar flexion stretch x30 seconds Chin tucks x10                                                                                                                                    PATIENT EDUCATION:  Education details: exam findings, POC, HEP  Person educated: Patient and Spouse Education method: Explanation, Facilities manager, and Handouts Education comprehension: verbalized understanding, returned demonstration, verbal cues required, and needs further education  HOME EXERCISE PROGRAM:  Access Code: [email or web address] URL: [email or web address] Date: 08/16/2023 Prepared by: Nedra Hai  Exercises - Seated Knee Extension with Anchored Resistance  - 1 x daily - 7 x weekly - 2 sets - 10 reps - 2 seconds  hold - Seated Single Leg Hip Abduction with Resistance  - 1 x daily - 7 x weekly - 2 sets - 10 reps - 2 seconds  hold - Seated Flexion Stretch  - 2 x daily - 7 x weekly - 1 sets - 2 reps - 30 seconds hold - Seated Cervical Retraction  - 1-2 x daily - 7 x weekly - 1 sets - 5-10 reps - 2 seconds  hold - Seated Scapular Retraction  - 2 x daily - 7 x weekly - 1 sets - 10 reps - 2 seconds  hold  ASSESSMENT:  CLINICAL IMPRESSION: PT with limited improvement in symptoms at this time, and has trialed several  modalities at this time.  We have discussed possibility of following back up with NP to discuss various options.  Will continue to benefit from PT to maximize function.    EVAL: Patient is a 63 y.o. F who was seen today for physical therapy evaluation and treatment for  Diagnosis  M54.50,G89.29 (ICD-10-CM) - Chronic bilateral low back pain without sciatica  M54.2 (ICD-10-CM) - Cervicalgia  . Exam with functional measures as above. Will make every effort to improve pain and improve QOL moving forward- might benefit from skilled water PT but she is  not interested in this at eval.   OBJECTIVE IMPAIRMENTS: Abnormal gait, decreased activity tolerance, decreased balance, decreased mobility, difficulty walking, decreased ROM, decreased strength, increased fascial restrictions, increased muscle spasms, impaired flexibility, improper body mechanics, postural dysfunction, and pain.   ACTIVITY LIMITATIONS: carrying, lifting, sitting, standing, sleeping, stairs, transfers, hygiene/grooming, and locomotion level  PARTICIPATION LIMITATIONS: meal prep, cleaning, laundry, shopping, community activity, and yard work  PERSONAL FACTORS: Age, Behavior pattern, Education, Fitness, Past/current experiences, Profession, Social background, and Time since onset of injury/illness/exacerbation are also affecting patient's functional outcome.   REHAB POTENTIAL: Fair chronicity of impairments, irritability/unpredictability of pain   CLINICAL DECISION MAKING: Evolving/moderate complexity  EVALUATION COMPLEXITY: Moderate   GOALS: Goals reviewed with patient? No  SHORT TERM GOALS: Target date: 08/30/2023    Will be compliant with appropriate progressive HEP  Baseline: Goal status: INITIAL  2.  Lumbar AROM to be no more than moderately limited on all planes of motion   Baseline:  Goal status: INITIAL  3.  Cervical AROM to have improved by 10 degrees all planes of motion  Baseline:  Goal status:  INITIAL   LONG TERM GOALS: Target date: 09/20/2023    MMT to have improved by one grade in all weak groups  Baseline:  Goal status: INITIAL  2.  Pain to be no more than 5/10 at worst  Baseline:  Goal status: INITIAL  3.  Will be able to tolerate community distance ambulation without increase in pain  Baseline:  Goal status: INITIAL  4.  FOTO score to have met or exceeded goal level by time of DC  Baseline:  Goal status: INITIAL  5.  Will be able to perform exercise as desired/appropriate in the gym without significant increase in pain  Baseline:  Goal status: INITIAL    PLAN:  PT FREQUENCY: 1-2x/week  PT DURATION: 6 weeks  PLANNED INTERVENTIONS: 97164- PT Re-evaluation, 97110-Therapeutic exercises, 97530- Therapeutic activity, 97112- Neuromuscular re-education, 97535- Self Care, 16109- Manual therapy, 97760- Orthotic Fit/training, 97014- Electrical stimulation (unattended), Taping, Dry Needling, Cryotherapy, and Moist heat.  PLAN FOR NEXT SESSION: check STGs, assess response to traction,  gentle mobility as tolerated, manual and DN as appropriate. Modalities as desired/appropriate. Refusing water PT for now   Clarita Crane, PT, DPT 08/24/23 12:44 PM     Date of referral: 07/30/23 Referring provider: Ellin Goodie NP  Referring diagnosis?  Diagnosis  M54.50,G89.29 (ICD-10-CM) - Chronic bilateral low back pain without sciatica  M54.2 (ICD-10-CM) - Cervicalgia   Treatment diagnosis? (if different than referring diagnosis)   M54.59, M54.2, M62.81, R26.89, R29.898  What was this (referring dx) caused by? Ongoing Issue  Ashby Dawes of Condition: Chronic (continuous duration > 3 months)   Laterality: Both  Current Functional Measure Score: FOTO 36  Objective measurements identify impairments when they are compared to normal values, the uninvolved extremity, and prior level of function.  [x]  Yes  []  No  Objective assessment of functional ability: Moderate  functional limitations   Briefly describe symptoms: pt reports acute onset of pain, however impairments are likely chronic given PMH and severity of impairments of objective measures found today   How did symptoms start: idiopathically   Average pain intensity:  Last 24 hours: 8/10  Past week: 8/10  How often does the pt experience symptoms? Constantly  How much have the symptoms interfered with usual daily activities? Quite a bit  How has condition changed since care began at this facility? No change  In general, how is the patients overall  health? Fair   BACK PAIN (STarT Back Screening Tool) Has pain spread down the leg(s) at some time in the last 2 weeks? no Has there been pain in the shoulder or neck at some time in the last 2 weeks? yes Has the pt only walked short distances because of back pain? Yes  Has patient dressed more slowly because of back pain in the past 2 weeks? It depends on the day- sometimes yes, sometimes no  Does patient think it's not safe for a person with this condition to be physically active? yes Does patient have worrying thoughts a lot of the time? Yes  Does patient feel back pain is terrible and will never get any better? Yes Has patient stopped enjoying things they usually enjoy? yes

## 2023-08-26 ENCOUNTER — Ambulatory Visit: Payer: Medicare Other | Admitting: Physical Therapy

## 2023-08-26 DIAGNOSIS — Z794 Long term (current) use of insulin: Secondary | ICD-10-CM | POA: Diagnosis not present

## 2023-08-26 DIAGNOSIS — M542 Cervicalgia: Secondary | ICD-10-CM | POA: Diagnosis not present

## 2023-08-26 DIAGNOSIS — M5459 Other low back pain: Secondary | ICD-10-CM

## 2023-08-26 DIAGNOSIS — M6281 Muscle weakness (generalized): Secondary | ICD-10-CM

## 2023-08-26 DIAGNOSIS — R2689 Other abnormalities of gait and mobility: Secondary | ICD-10-CM

## 2023-08-26 DIAGNOSIS — R29898 Other symptoms and signs involving the musculoskeletal system: Secondary | ICD-10-CM

## 2023-08-26 DIAGNOSIS — E042 Nontoxic multinodular goiter: Secondary | ICD-10-CM | POA: Diagnosis not present

## 2023-08-26 DIAGNOSIS — E113293 Type 2 diabetes mellitus with mild nonproliferative diabetic retinopathy without macular edema, bilateral: Secondary | ICD-10-CM | POA: Diagnosis not present

## 2023-08-26 LAB — HEMOGLOBIN A1C: Hemoglobin A1C: 8.6

## 2023-08-26 NOTE — Therapy (Signed)
OUTPATIENT PHYSICAL THERAPY THORACOLUMBAR TREATMENT   Patient Name: Adrienne Bennett MRN: [id number] DOB:[birth year], 63 y.o., female Today's Date: 08/26/2023  END OF SESSION:  PT End of Session - 08/26/23 0929     Visit Number 5    Number of Visits 13    Date for PT Re-Evaluation 09/20/23    Authorization Type [employer] MCR    Authorization Time Period 08/09/23 to 09/20/23    Progress Note Due on Visit 10    PT Start Time 0926    PT Stop Time 1005    PT Time Calculation (min) 39 min    Activity Tolerance Patient tolerated treatment well    Behavior During Therapy [facility] for tasks assessed/performed                 Past Medical History:  Diagnosis Date   Bipolar affective disorder (HCC)    PTSD, agoraphobiam ,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    GANGLION CYST 07/22/2010   Qualifier: Diagnosis of  By: [name] MD, Jose E.    GERD (gastroesophageal reflux disease) 05/23/2017   Hyperlipidemia 10/12/2012   IBS (irritable bowel syndrome)    chronic Diarrhea   Macular degeneration, dry    MIGRAINE HEADACHE 03/13/2010   Qualifier: Diagnosis of  By: [name] MD, [name].    Neck pain 02/03/2011   Osteoarthritis    "back; goes down my right leg" (09/01/2013)   Subclinical hyperthyroidism 02/26/2018   Thyromegaly 12/04/2014   Type II diabetes mellitus (HCC) 2007   Past Surgical History:  Procedure Laterality Date   CARPAL TUNNEL RELEASE Bilateral ~ 2000   cataract surgery Bilateral    TONSILLECTOMY  07/27/1968   TUBAL LIGATION  07/27/1994   Patient Active Problem List   Diagnosis Date Noted   Atrial ectopy 02/02/2023   Hypokalemia 02/02/2023   Hypomagnesemia 02/02/2023    Hypocalcemia 02/02/2023   Physical debility 08/03/2022   Chronic heart failure with preserved ejection fraction (HFpEF) (HCC) 08/03/2022   Exocrine pancreatic insufficiency 04/04/2022   Paroxysmal atrial fibrillation (HCC) 04/01/2022   Full incontinence of feces 02/15/2022   Chronic diarrhea 02/15/2022   Irritable bowel syndrome with diarrhea 02/15/2022   Nausea 02/15/2022   History of colonic polyps 02/15/2022   Chronic anticoagulation 02/15/2022   Change in bowel habits 02/15/2022   On home oxygen therapy 02/15/2022   Acute on chronic diastolic CHF (congestive heart failure) (HCC) 01/23/2022   Chronic respiratory failure (HCC) 01/23/2022   CKD (chronic kidney disease) stage 3, GFR 30-59 ml/min (HCC) 01/23/2022   Acute respiratory failure with hypoxia (HCC) 01/10/2022   AKI (acute kidney injury) (HCC) 01/09/2022   COPD (chronic obstructive pulmonary disease) (HCC) 01/08/2022   Dizziness 04/08/2021   Combined forms of age-related cataract of left eye 04/01/2021   High cholesterol    Congestive heart failure (HCC) 12/03/2020   Osteoarthritis    IBS (irritable bowel syndrome)    Depression    Macular degeneration, dry 02/01/2020   Diabetic retinopathy (HCC) 03/31/2018   Subclinical hyperthyroidism 02/26/2018   GERD (gastroesophageal reflux disease) 05/23/2017   DJD (degenerative joint disease) 03/04/2016   PCP NOTES >>>>>>>>>> 12/02/2015   Thyromegaly 12/04/2014   Bipolar affective disorder (  HCC)    Annual physical exam 07/13/2012   Neck pain 02/03/2011   Essential hypertension 09/04/2010   Ganglion 07/22/2010   MIGRAINE HEADACHE 03/13/2010   DM (diabetes mellitus), secondary uncontrolled (HCC) 07/16/2008   Type II diabetes mellitus (HCC) 2007    PCP: Willow Ora MD   REFERRING PROVIDER: Juanda Chance, NP  REFERRING DIAG:  Diagnosis  M54.50,G89.29 (ICD-10-CM) - Chronic bilateral low back pain without sciatica  M54.2 (ICD-10-CM) - Cervicalgia    Rationale for  Evaluation and Treatment: Rehabilitation  THERAPY DIAG:  Other low back pain  Cervicalgia  Muscle weakness (generalized)  Other abnormalities of gait and mobility  Other symptoms and signs involving the musculoskeletal system  ONSET DATE: "about 3 weeks ago"   SUBJECTIVE:                                                                                                                                                                                           SUBJECTIVE STATEMENT: Pain is still a 7-8/10. Last night wasn't able to turn head side to side.  Doesn't feel traction was beneficial.  EVAL: This pain has been around for about 3 weeks, I had to wear a back brace as a teenager due to scoliosis. My neck where it meets my spine I can feel the bones rubbing against each other and I can hear it sometimes. Its the front of the shoulder and down but in my back. I have pain where my spine meets my tailbone. My hips hurt a lot today, they never hurt. Tripped on cane and fell last week, skinned knees.    PERTINENT HISTORY:  See above; pt reports hx of 3 seizures  PAIN:  Are you having pain? Yes: NPRS scale: 8/10 Pain location: shoulder R  Pain description: "it feels like someone is operating on me without anesthsia"  Aggravating factors: wearing heavy purse  Relieving factors: tylenol (extra strength)  PRECAUTIONS: Fall  RED FLAGS: None   WEIGHT BEARING RESTRICTIONS: No  FALLS:  Has patient fallen in last 6 months? Yes. Number of falls 1; tripped over cane, no FOF "I'm used to it"   LIVING ENVIRONMENT: Lives with: lives with their family Lives in: House/apartment Stairs: two story home, has to go upstairs to bedroom/bathroom no chair lift  Has following equipment at home: Single point cane and home O2   OCCUPATION: disability   PLOF: Independent, Independent with basic ADLs, Independent with gait, Independent with transfers, and Requires assistive device for  independence  PATIENT GOALS: not hurt/pain control, be able to be more active and go on hikes   NEXT MD VISIT: Referring  after PT   OBJECTIVE:  Note: Objective measures were completed at Evaluation unless otherwise noted.  DIAGNOSTIC FINDINGS:   FINDINGS: There is no evidence of lumbar spine fracture. Scoliosis of spine. Mild-to-moderate degenerative joint changes throughout lumbar spine with narrow intervertebral space, anterior spurring and facet joint sclerosis.   IMPRESSION: Mild-to-moderate degenerative joint changes throughout lumbar spine.  FINDINGS: Straightening of the cervical spine. Suboptimal visualization C7 and below. Normal prevertebral soft tissue thickness. Vertebral body heights are maintained. Moderate diffuse disc space narrowing C3 through C7. Dens and lateral masses are partially obscured by teeth   IMPRESSION: Straightening of the cervical spine with moderate diffuse disc space narrowing C3 through C7.    PATIENT SURVEYS:  EVAL: FOTO 36, predicted 46 in 13 visits   COGNITION: Overall cognitive status: Within functional limits for tasks assessed      POSTURE: rounded shoulders, forward head, decreased lumbar lordosis, and increased thoracic kyphosis  PALPATION:   Tight but not tender to palpation  LUMBAR ROM:   AROM eval  Flexion Severe limitation   Extension Severe limitation   Right lateral flexion Severe limitation   Left lateral flexion Severe limitation   Right rotation Severe limitation   Left rotation Severe limitation   Cervical flexion  40*  Cervical extension 7*  Cervical lateral flexion  R 7* L 13*  Cervical rotation  R 33* L 30*   (Blank rows = not tested)    LOWER EXTREMITY MMT:    MMT Right eval Left eval  Hip flexion 3 3  Hip extension    Hip abduction 2  2  Hip adduction    Hip internal rotation    Hip external rotation    Knee flexion    Knee extension 4 4  Ankle dorsiflexion 5 5  Ankle plantarflexion     Ankle inversion    Ankle eversion     (Blank rows = not tested)  EVAL Shoulder flexion 4/5 B, shoulder ABD R 4/5 L 3/5, biceps 3+/5, triceps 2/5   08/24/23:  Rt shoulder flexion 3+/5; abduction 3/5    GAIT: Distance walked: in clinic distances  Assistive device utilized: Single point cane Level of assistance: Modified independence Comments: significant LLD noted from scoliosis   TREATMENT DATE:  08/26/23 TherEx Nustep L6, 5 x 8 minutes LEs and LUE Row L3 band 2x10 Bil ER with scapular retraction L1 band; 2x10 - stand by A needed for balance Standing calf raises x 20 reps Standing hip abduction x 20 reps bil; L1 band Standing hip extension x 20 reps bil; L1 band Standing bil "Y" x10 reps  08/24/23 TherEx Nustep L6, 5 x 8 minutes LEs and LUE  Modalities Cervical traction x 10 min; 15#/10# with 60 sec/20 sec pull.    Self Care Long discussion with pt about current progress and plan of care.  She's tried exercise as well as several other modalities without improvement in pain.  Trial of traction today to see if this is helpful and discussed possible return to see NP should symptoms not improve.   08/19/23 TherEx Nustep L5 x 8 minutes LEs and LUE Posterior capsule stretch on Rt 2x20 sec; mod cues for technique Scapular retraction x10 reps; 5 sec hold  Manual STM with compression to Rt infraspinatus; skilled palpation and monitoring of soft tissue during DN Trigger Point Dry Needling  Initial Treatment: Pt instructed on Dry Needling rational, procedures, and possible side effects. Pt instructed to expect mild to moderate muscle soreness later in  the day and/or into the next day.  Pt instructed in methods to reduce muscle soreness. Pt instructed to continue prescribed HEP. Because Dry Needling was performed over or adjacent to a lung field, pt was educated on S/S of pneumothorax and to seek immediate medical attention should they occur.  Patient was educated on signs and  symptoms of infection and other risk factors and advised to seek medical attention should they occur.  Patient verbalized understanding of these instructions and education.   Patient Verbal Consent Given: Yes Education Handout Provided: Yes Muscles Treated: Rt infraspinatus Electrical Stimulation Performed: No Treatment Response/Outcome: twitch responses noted; mild soreness reported    08/16/23 TherEx Nustep L5 x6 minutes BLEs only, dropped to L4 mid way thru Upper trap stretch 2x30 seconds B Cervical rotation ROM x10 B  Scap retractions x15  Levator stretches 2x30 seconds B   MHP on during cervical stretches/ROM x8 minutes  Education Discussed and printed out an example laptop bag that would be light weight  and that she could wear messenger style to reduce shoulder pain Recommended [place] [employer] to upgrade to a [facility]     08/09/23 Eval, care planning, HEP   Seated LAQs red TB x5 B Seated single leg hip ABD x5 B red TB Bicep curls 2# x10 B Lumbar flexion stretch x30 seconds Chin tucks x10                                                                                                                                    PATIENT EDUCATION:  Education details: exam findings, POC, HEP  Person educated: Patient and Spouse Education method: Explanation, Facilities manager, and Handouts Education comprehension: verbalized understanding, returned demonstration, verbal cues required, and needs further education  HOME EXERCISE PROGRAM:  Access Code: [email or web address] URL: [email or web address] Date: 08/16/2023 Prepared by: Nedra Hai  Exercises - Seated Knee Extension with Anchored Resistance  - 1 x daily - 7 x weekly - 2 sets - 10 reps - 2 seconds  hold - Seated Single Leg Hip Abduction with Resistance  - 1 x daily - 7 x weekly - 2 sets - 10 reps - 2 seconds  hold - Seated Flexion Stretch  - 2 x daily - 7 x weekly - 1 sets - 2 reps - 30 seconds hold -  Seated Cervical Retraction  - 1-2 x daily - 7 x weekly - 1 sets - 5-10 reps - 2 seconds  hold - Seated Scapular Retraction  - 2 x daily - 7 x weekly - 1 sets - 10 reps - 2 seconds  hold  ASSESSMENT:  CLINICAL IMPRESSION: Pt overall tolerated session well today which focused more on activity tolerance and strengthening.  She has had limited benefit from pain management strategies in PT with limited options remaining at this time.  Plan to continue with exercise and activity as tolerated and recommend follow up with NP to discuss  other treatment options. Will continue to benefit from PT to maximize function.    EVAL: Patient is a 63 y.o. F who was seen today for physical therapy evaluation and treatment for  Diagnosis  M54.50,G89.29 (ICD-10-CM) - Chronic bilateral low back pain without sciatica  M54.2 (ICD-10-CM) - Cervicalgia  . Exam with functional measures as above. Will make every effort to improve pain and improve QOL moving forward- might benefit from skilled water PT but she is not interested in this at eval.   OBJECTIVE IMPAIRMENTS: Abnormal gait, decreased activity tolerance, decreased balance, decreased mobility, difficulty walking, decreased ROM, decreased strength, increased fascial restrictions, increased muscle spasms, impaired flexibility, improper body mechanics, postural dysfunction, and pain.   ACTIVITY LIMITATIONS: carrying, lifting, sitting, standing, sleeping, stairs, transfers, hygiene/grooming, and locomotion level  PARTICIPATION LIMITATIONS: meal prep, cleaning, laundry, shopping, community activity, and yard work  PERSONAL FACTORS: Age, Behavior pattern, Education, Fitness, Past/current experiences, Profession, Social background, and Time since onset of injury/illness/exacerbation are also affecting patient's functional outcome.   REHAB POTENTIAL: Fair chronicity of impairments, irritability/unpredictability of pain   CLINICAL DECISION MAKING: Evolving/moderate  complexity  EVALUATION COMPLEXITY: Moderate   GOALS: Goals reviewed with patient? No  SHORT TERM GOALS: Target date: 08/30/2023    Will be compliant with appropriate progressive HEP  Baseline: Goal status: MET 08/26/23  2.  Lumbar AROM to be no more than moderately limited on all planes of motion   Baseline:  Goal status: INITIAL    3.  Cervical AROM to have improved by 10 degrees all planes of motion  Baseline:  Goal status: INITIAL    LONG TERM GOALS: Target date: 09/20/2023    MMT to have improved by one grade in all weak groups  Baseline:  Goal status: INITIAL  2.  Pain to be no more than 5/10 at worst  Baseline:  Goal status: INITIAL  3.  Will be able to tolerate community distance ambulation without increase in pain  Baseline:  Goal status: INITIAL  4.  FOTO score to have met or exceeded goal level by time of DC  Baseline:  Goal status: INITIAL  5.  Will be able to perform exercise as desired/appropriate in the gym without significant increase in pain  Baseline:  Goal status: INITIAL    PLAN:  PT FREQUENCY: 1-2x/week  PT DURATION: 6 weeks  PLANNED INTERVENTIONS: 97164- PT Re-evaluation, 97110-Therapeutic exercises, 97530- Therapeutic activity, 97112- Neuromuscular re-education, 97535- Self Care, 16109- Manual therapy, 97760- Orthotic Fit/training, 97014- Electrical stimulation (unattended), Taping, Dry Needling, Cryotherapy, and Moist heat.  PLAN FOR NEXT SESSION:  check STGs next visit,  repeat traction (pt's request),  gentle mobility as tolerated, manual and DN as appropriate. Modalities as desired/appropriate. Refusing water PT for now     Clarita Crane, PT, DPT 08/26/23 10:13 AM     Date of referral: 07/30/23 Referring provider: Ellin Goodie NP  Referring diagnosis?  Diagnosis  M54.50,G89.29 (ICD-10-CM) - Chronic bilateral low back pain without sciatica  M54.2 (ICD-10-CM) - Cervicalgia   Treatment diagnosis? (if different than  referring diagnosis)   M54.59, M54.2, M62.81, R26.89, R29.898  What was this (referring dx) caused by? Ongoing Issue  Ashby Dawes of Condition: Chronic (continuous duration > 3 months)   Laterality: Both  Current Functional Measure Score: FOTO 36  Objective measurements identify impairments when they are compared to normal values, the uninvolved extremity, and prior level of function.  [x]  Yes  []  No  Objective assessment of functional ability: Moderate functional limitations  Briefly describe symptoms: pt reports acute onset of pain, however impairments are likely chronic given PMH and severity of impairments of objective measures found today   How did symptoms start: idiopathically   Average pain intensity:  Last 24 hours: 8/10  Past week: 8/10  How often does the pt experience symptoms? Constantly  How much have the symptoms interfered with usual daily activities? Quite a bit  How has condition changed since care began at this facility? No change  In general, how is the patients overall health? Fair   BACK PAIN (STarT Back Screening Tool) Has pain spread down the leg(s) at some time in the last 2 weeks? no Has there been pain in the shoulder or neck at some time in the last 2 weeks? yes Has the pt only walked short distances because of back pain? Yes  Has patient dressed more slowly because of back pain in the past 2 weeks? It depends on the day- sometimes yes, sometimes no  Does patient think it's not safe for a person with this condition to be physically active? yes Does patient have worrying thoughts a lot of the time? Yes  Does patient feel back pain is terrible and will never get any better? Yes Has patient stopped enjoying things they usually enjoy? yes

## 2023-08-27 ENCOUNTER — Ambulatory Visit: Payer: Medicare Other | Admitting: Internal Medicine

## 2023-08-30 ENCOUNTER — Ambulatory Visit: Payer: Medicare Other | Admitting: Physical Therapy

## 2023-08-30 ENCOUNTER — Encounter: Payer: Self-pay | Admitting: Physical Therapy

## 2023-08-30 DIAGNOSIS — M6281 Muscle weakness (generalized): Secondary | ICD-10-CM

## 2023-08-30 DIAGNOSIS — M542 Cervicalgia: Secondary | ICD-10-CM

## 2023-08-30 DIAGNOSIS — M5459 Other low back pain: Secondary | ICD-10-CM | POA: Diagnosis not present

## 2023-08-30 DIAGNOSIS — R29898 Other symptoms and signs involving the musculoskeletal system: Secondary | ICD-10-CM | POA: Diagnosis not present

## 2023-08-30 DIAGNOSIS — R2689 Other abnormalities of gait and mobility: Secondary | ICD-10-CM

## 2023-08-30 NOTE — Therapy (Signed)
OUTPATIENT PHYSICAL THERAPY THORACOLUMBAR TREATMENT   Patient Name: Adrienne Bennett MRN: [id number] DOB:09-29-1960, 63 y.o., female Today's Date: 08/30/2023  END OF SESSION:  PT End of Session - 08/30/23 1010     Visit Number 6    Number of Visits 13    Date for PT Re-Evaluation 09/20/23    Authorization Type [employer] MCR    Authorization Time Period 08/09/23 to 09/20/23    Authorization - Number of Visits 13    Progress Note Due on Visit 10    PT Start Time 1006    PT Stop Time 1046    PT Time Calculation (min) 40 min    Activity Tolerance Patient tolerated treatment well    Behavior During Therapy WFL for tasks assessed/performed                  Past Medical History:  Diagnosis Date   Bipolar affective disorder (HCC)    PTSD, agoraphobiam ,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    GANGLION CYST 07/22/2010   Qualifier: Diagnosis of  By: [name] MD, Jose E.    GERD (gastroesophageal reflux disease) 05/23/2017   Hyperlipidemia 10/12/2012   IBS (irritable bowel syndrome)    chronic Diarrhea   Macular degeneration, dry    MIGRAINE HEADACHE 03/13/2010   Qualifier: Diagnosis of  By: [name] MD, [name].    Neck pain 02/03/2011   Osteoarthritis    "back; goes down my right leg" (09/01/2013)   Subclinical hyperthyroidism 02/26/2018   Thyromegaly 12/04/2014   Type II diabetes mellitus (HCC) 2007   Past Surgical History:  Procedure Laterality Date   CARPAL TUNNEL RELEASE Bilateral ~ 2000   cataract surgery Bilateral    TONSILLECTOMY  07/27/1968   TUBAL LIGATION  07/27/1994   Patient Active Problem List   Diagnosis Date Noted   Atrial ectopy 02/02/2023   Hypokalemia 02/02/2023    Hypomagnesemia 02/02/2023   Hypocalcemia 02/02/2023   Physical debility 08/03/2022   Chronic heart failure with preserved ejection fraction (HFpEF) (HCC) 08/03/2022   Exocrine pancreatic insufficiency 04/04/2022   Paroxysmal atrial fibrillation (HCC) 04/01/2022   Full incontinence of feces 02/15/2022   Chronic diarrhea 02/15/2022   Irritable bowel syndrome with diarrhea 02/15/2022   Nausea 02/15/2022   History of colonic polyps 02/15/2022   Chronic anticoagulation 02/15/2022   Change in bowel habits 02/15/2022   On home oxygen therapy 02/15/2022   Acute on chronic diastolic CHF (congestive heart failure) (HCC) 01/23/2022   Chronic respiratory failure (HCC) 01/23/2022   CKD (chronic kidney disease) stage 3, GFR 30-59 ml/min (HCC) 01/23/2022   Acute respiratory failure with hypoxia (HCC) 01/10/2022   AKI (acute kidney injury) (HCC) 01/09/2022   COPD (chronic obstructive pulmonary disease) (HCC) 01/08/2022   Dizziness 04/08/2021   Combined forms of age-related cataract of left eye 04/01/2021   High cholesterol    Congestive heart failure (HCC) 12/03/2020   Osteoarthritis    IBS (irritable bowel syndrome)    Depression    Macular degeneration, dry 02/01/2020   Diabetic retinopathy (HCC) 03/31/2018   Subclinical hyperthyroidism 02/26/2018   GERD (gastroesophageal reflux disease) 05/23/2017   DJD (degenerative joint disease) 03/04/2016   PCP NOTES >>>>>>>>>>  12/02/2015   Thyromegaly 12/04/2014   Bipolar affective disorder (HCC)    Annual physical exam 07/13/2012   Neck pain 02/03/2011   Essential hypertension 09/04/2010   Ganglion 07/22/2010   MIGRAINE HEADACHE 03/13/2010   DM (diabetes mellitus), secondary uncontrolled (HCC) 07/16/2008   Type II diabetes mellitus (HCC) 2007    PCP: Willow Ora MD   REFERRING PROVIDER: Juanda Chance, NP  REFERRING DIAG:  Diagnosis  M54.50,G89.29 (ICD-10-CM) - Chronic bilateral low back pain without sciatica  M54.2 (ICD-10-CM) -  Cervicalgia    Rationale for Evaluation and Treatment: Rehabilitation  THERAPY DIAG:  Other low back pain  Cervicalgia  Muscle weakness (generalized)  Other abnormalities of gait and mobility  Other symptoms and signs involving the musculoskeletal system  ONSET DATE: "about 3 weeks ago"   SUBJECTIVE:                                                                                                                                                                                           SUBJECTIVE STATEMENT: "I'm a solid 8."  Not sore after last session.  EVAL: This pain has been around for about 3 weeks, I had to wear a back brace as a teenager due to scoliosis. My neck where it meets my spine I can feel the bones rubbing against each other and I can hear it sometimes. Its the front of the shoulder and down but in my back. I have pain where my spine meets my tailbone. My hips hurt a lot today, they never hurt. Tripped on cane and fell last week, skinned knees.    PERTINENT HISTORY:  See above; pt reports hx of 3 seizures  PAIN:  Are you having pain? Yes: NPRS scale: 8/10 Pain location: shoulder R  Pain description: "it feels like someone is operating on me without anesthsia"  Aggravating factors: wearing heavy purse  Relieving factors: tylenol (extra strength)  PRECAUTIONS: Fall  RED FLAGS: None   WEIGHT BEARING RESTRICTIONS: No  FALLS:  Has patient fallen in last 6 months? Yes. Number of falls 1; tripped over cane, no FOF "I'm used to it"   LIVING ENVIRONMENT: Lives with: lives with their family Lives in: House/apartment Stairs: two story home, has to go upstairs to bedroom/bathroom no chair lift  Has following equipment at home: Single point cane and home O2   OCCUPATION: disability   PLOF: Independent, Independent with basic ADLs, Independent with gait, Independent with transfers, and Requires assistive device for independence  PATIENT GOALS: not hurt/pain  control, be able to be more active and go on hikes   NEXT MD VISIT: Referring after  PT   OBJECTIVE:  Note: Objective measures were completed at Evaluation unless otherwise noted.  DIAGNOSTIC FINDINGS:   FINDINGS: There is no evidence of lumbar spine fracture. Scoliosis of spine. Mild-to-moderate degenerative joint changes throughout lumbar spine with narrow intervertebral space, anterior spurring and facet joint sclerosis.   IMPRESSION: Mild-to-moderate degenerative joint changes throughout lumbar spine.  FINDINGS: Straightening of the cervical spine. Suboptimal visualization C7 and below. Normal prevertebral soft tissue thickness. Vertebral body heights are maintained. Moderate diffuse disc space narrowing C3 through C7. Dens and lateral masses are partially obscured by teeth   IMPRESSION: Straightening of the cervical spine with moderate diffuse disc space narrowing C3 through C7.    PATIENT SURVEYS:  EVAL: FOTO 36, predicted 46 in 13 visits   COGNITION: Overall cognitive status: Within functional limits for tasks assessed      POSTURE: rounded shoulders, forward head, decreased lumbar lordosis, and increased thoracic kyphosis  PALPATION:   Tight but not tender to palpation  LUMBAR ROM:   AROM eval  Flexion Severe limitation   Extension Severe limitation   Right lateral flexion Severe limitation   Left lateral flexion Severe limitation   Right rotation Severe limitation   Left rotation Severe limitation   Cervical flexion  40*  Cervical extension 7*  Cervical lateral flexion  R 7* L 13*  Cervical rotation  R 33* L 30*   (Blank rows = not tested)    LOWER EXTREMITY MMT:    MMT Right eval Left eval  Hip flexion 3 3  Hip extension    Hip abduction 2  2  Hip adduction    Hip internal rotation    Hip external rotation    Knee flexion    Knee extension 4 4  Ankle dorsiflexion 5 5  Ankle plantarflexion    Ankle inversion    Ankle eversion      (Blank rows = not tested)  EVAL Shoulder flexion 4/5 B, shoulder ABD R 4/5 L 3/5, biceps 3+/5, triceps 2/5   08/24/23:  Rt shoulder flexion 3+/5; abduction 3/5    GAIT: Distance walked: in clinic distances  Assistive device utilized: Single point cane Level of assistance: Modified independence Comments: significant LLD noted from scoliosis   TREATMENT DATE:  08/30/23 TherEx Nustep L6, 5 x 8 minutes LEs and LUE  Neuro Re-ed Seated on green balance disc for balance and proprioception: Bil ER with scap retraction L1 band 2x10 Bil horizontal abduction L1 band 2x10 Rows L1 band 2x10 Trunk rotation with 2# ball with outstretched arms 2x10 bil Chest press with overhead press and 2# ball 2x10 Bicep curls 2# bil 2x10 Shoulder flexion 1# bil 2x10 Shoulder abduction 1# bil 2x10  08/26/23 TherEx Nustep L6, 5 x 8 minutes LEs and LUE Row L3 band 2x10 Bil ER with scapular retraction L1 band; 2x10 - stand by A needed for balance Standing calf raises x 20 reps Standing hip abduction x 20 reps bil; L1 band Standing hip extension x 20 reps bil; L1 band Standing bil "Y" x10 reps  08/24/23 TherEx Nustep L6, 5 x 8 minutes LEs and LUE  Modalities Cervical traction x 10 min; 15#/10# with 60 sec/20 sec pull.    Self Care Long discussion with pt about current progress and plan of care.  She's tried exercise as well as several other modalities without improvement in pain.  Trial of traction today to see if this is helpful and discussed possible return to see NP should symptoms not improve.  08/19/23 TherEx Nustep L5 x 8 minutes LEs and LUE Posterior capsule stretch on Rt 2x20 sec; mod cues for technique Scapular retraction x10 reps; 5 sec hold  Manual STM with compression to Rt infraspinatus; skilled palpation and monitoring of soft tissue during DN Trigger Point Dry Needling  Initial Treatment: Pt instructed on Dry Needling rational, procedures, and possible side effects. Pt  instructed to expect mild to moderate muscle soreness later in the day and/or into the next day.  Pt instructed in methods to reduce muscle soreness. Pt instructed to continue prescribed HEP. Because Dry Needling was performed over or adjacent to a lung field, pt was educated on S/S of pneumothorax and to seek immediate medical attention should they occur.  Patient was educated on signs and symptoms of infection and other risk factors and advised to seek medical attention should they occur.  Patient verbalized understanding of these instructions and education.   Patient Verbal Consent Given: Yes Education Handout Provided: Yes Muscles Treated: Rt infraspinatus Electrical Stimulation Performed: No Treatment Response/Outcome: twitch responses noted; mild soreness reported    08/16/23 TherEx Nustep L5 x6 minutes BLEs only, dropped to L4 mid way thru Upper trap stretch 2x30 seconds B Cervical rotation ROM x10 B  Scap retractions x15  Levator stretches 2x30 seconds B   MHP on during cervical stretches/ROM x8 minutes  Education Discussed and printed out an example laptop bag that would be light weight  and that she could wear messenger style to reduce shoulder pain Recommended [place] [employer] to upgrade to a [facility]     08/09/23 Eval, care planning, HEP   Seated LAQs red TB x5 B Seated single leg hip ABD x5 B red TB Bicep curls 2# x10 B Lumbar flexion stretch x30 seconds Chin tucks x10                                                                                                                                    PATIENT EDUCATION:  Education details: exam findings, POC, HEP  Person educated: Patient and Spouse Education method: Explanation, Facilities manager, and Handouts Education comprehension: verbalized understanding, returned demonstration, verbal cues required, and needs further education  HOME EXERCISE PROGRAM:  Access Code: [email or web address] URL:  [email or web address] Date: 08/16/2023 Prepared by: Nedra Hai  Exercises - Seated Knee Extension with Anchored Resistance  - 1 x daily - 7 x weekly - 2 sets - 10 reps - 2 seconds  hold - Seated Single Leg Hip Abduction with Resistance  - 1 x daily - 7 x weekly - 2 sets - 10 reps - 2 seconds  hold - Seated Flexion Stretch  - 2 x daily - 7 x weekly - 1 sets - 2 reps - 30 seconds hold - Seated Cervical Retraction  - 1-2 x daily - 7 x weekly - 1 sets - 5-10 reps - 2 seconds  hold - Seated Scapular Retraction  -  2 x daily - 7 x weekly - 1 sets - 10 reps - 2 seconds  hold  ASSESSMENT:  CLINICAL IMPRESSION: Session continued to focus on balance, proprioception and strengthening today without increasing pain. She continues to have high reports of pain but does appt with NP scheduled to discuss.  Will reassess STGs next visit.      EVAL: Patient is a 63 y.o. F who was seen today for physical therapy evaluation and treatment for  Diagnosis  M54.50,G89.29 (ICD-10-CM) - Chronic bilateral low back pain without sciatica  M54.2 (ICD-10-CM) - Cervicalgia  . Exam with functional measures as above. Will make every effort to improve pain and improve QOL moving forward- might benefit from skilled water PT but she is not interested in this at eval.   OBJECTIVE IMPAIRMENTS: Abnormal gait, decreased activity tolerance, decreased balance, decreased mobility, difficulty walking, decreased ROM, decreased strength, increased fascial restrictions, increased muscle spasms, impaired flexibility, improper body mechanics, postural dysfunction, and pain.   ACTIVITY LIMITATIONS: carrying, lifting, sitting, standing, sleeping, stairs, transfers, hygiene/grooming, and locomotion level  PARTICIPATION LIMITATIONS: meal prep, cleaning, laundry, shopping, community activity, and yard work  PERSONAL FACTORS: Age, Behavior pattern, Education, Fitness, Past/current experiences, Profession, Social background, and  Time since onset of injury/illness/exacerbation are also affecting patient's functional outcome.   REHAB POTENTIAL: Fair chronicity of impairments, irritability/unpredictability of pain   CLINICAL DECISION MAKING: Evolving/moderate complexity  EVALUATION COMPLEXITY: Moderate   GOALS: Goals reviewed with patient? No  SHORT TERM GOALS: Target date: 08/30/2023    Will be compliant with appropriate progressive HEP  Baseline: Goal status: MET 08/26/23  2.  Lumbar AROM to be no more than moderately limited on all planes of motion   Baseline:  Goal status: INITIAL    3.  Cervical AROM to have improved by 10 degrees all planes of motion  Baseline:  Goal status: INITIAL    LONG TERM GOALS: Target date: 09/20/2023    MMT to have improved by one grade in all weak groups  Baseline:  Goal status: INITIAL  2.  Pain to be no more than 5/10 at worst  Baseline:  Goal status: INITIAL  3.  Will be able to tolerate community distance ambulation without increase in pain  Baseline:  Goal status: INITIAL  4.  FOTO score to have met or exceeded goal level by time of DC  Baseline:  Goal status: INITIAL  5.  Will be able to perform exercise as desired/appropriate in the gym without significant increase in pain  Baseline:  Goal status: INITIAL    PLAN:  PT FREQUENCY: 1-2x/week  PT DURATION: 6 weeks  PLANNED INTERVENTIONS: 97164- PT Re-evaluation, 97110-Therapeutic exercises, 97530- Therapeutic activity, 97112- Neuromuscular re-education, 97535- Self Care, 21308- Manual therapy, 97760- Orthotic Fit/training, 97014- Electrical stimulation (unattended), Taping, Dry Needling, Cryotherapy, and Moist heat.  PLAN FOR NEXT SESSION:  check STGs next visit (PT missed this),  repeat traction (pt's request),  gentle mobility as tolerated, manual and DN as appropriate. Modalities as desired/appropriate. Refusing water PT for now     Clarita Crane, PT, DPT 08/30/23 11:39 AM     Date  of referral: 07/30/23 Referring provider: Ellin Goodie NP  Referring diagnosis?  Diagnosis  M54.50,G89.29 (ICD-10-CM) - Chronic bilateral low back pain without sciatica  M54.2 (ICD-10-CM) - Cervicalgia   Treatment diagnosis? (if different than referring diagnosis)   M54.59, M54.2, M62.81, R26.89, R29.898  What was this (referring dx) caused by? Ongoing Issue  Ashby Dawes of Condition: Chronic (continuous duration >  3 months)   Laterality: Both  Current Functional Measure Score: FOTO 36  Objective measurements identify impairments when they are compared to normal values, the uninvolved extremity, and prior level of function.  [x]  Yes  []  No  Objective assessment of functional ability: Moderate functional limitations   Briefly describe symptoms: pt reports acute onset of pain, however impairments are likely chronic given PMH and severity of impairments of objective measures found today   How did symptoms start: idiopathically   Average pain intensity:  Last 24 hours: 8/10  Past week: 8/10  How often does the pt experience symptoms? Constantly  How much have the symptoms interfered with usual daily activities? Quite a bit  How has condition changed since care began at this facility? No change  In general, how is the patients overall health? Fair   BACK PAIN (STarT Back Screening Tool) Has pain spread down the leg(s) at some time in the last 2 weeks? no Has there been pain in the shoulder or neck at some time in the last 2 weeks? yes Has the pt only walked short distances because of back pain? Yes  Has patient dressed more slowly because of back pain in the past 2 weeks? It depends on the day- sometimes yes, sometimes no  Does patient think it's not safe for a person with this condition to be physically active? yes Does patient have worrying thoughts a lot of the time? Yes  Does patient feel back pain is terrible and will never get any better? Yes Has patient stopped  enjoying things they usually enjoy? yes

## 2023-09-01 ENCOUNTER — Ambulatory Visit: Payer: Medicare Other | Admitting: Physical Therapy

## 2023-09-01 ENCOUNTER — Encounter: Payer: Self-pay | Admitting: Physical Therapy

## 2023-09-01 DIAGNOSIS — R29898 Other symptoms and signs involving the musculoskeletal system: Secondary | ICD-10-CM

## 2023-09-01 DIAGNOSIS — M6281 Muscle weakness (generalized): Secondary | ICD-10-CM | POA: Diagnosis not present

## 2023-09-01 DIAGNOSIS — R2689 Other abnormalities of gait and mobility: Secondary | ICD-10-CM

## 2023-09-01 DIAGNOSIS — M5459 Other low back pain: Secondary | ICD-10-CM | POA: Diagnosis not present

## 2023-09-01 DIAGNOSIS — M542 Cervicalgia: Secondary | ICD-10-CM

## 2023-09-01 NOTE — Therapy (Signed)
 OUTPATIENT PHYSICAL THERAPY THORACOLUMBAR TREATMENT   Patient Name: Adrienne Bennett MRN: [id number] DOB:[birth year], 63 y.o., female Today's Date: 09/01/2023  END OF SESSION:  PT End of Session - 09/01/23 1017     Visit Number 7    Number of Visits 13    Date for PT Re-Evaluation 09/20/23    Authorization Type [employer] MCR    Authorization Time Period 08/09/23 to 09/20/23    Authorization - Number of Visits 13    Progress Note Due on Visit 10    PT Start Time 1012    PT Stop Time 1052    PT Time Calculation (min) 40 min    Activity Tolerance Patient tolerated treatment well    Behavior During Therapy WFL for tasks assessed/performed                   Past Medical History:  Diagnosis Date   Bipolar affective disorder (HCC)    PTSD, agoraphobiam ,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    GANGLION CYST 07/22/2010   Qualifier: Diagnosis of  By: Amon MD, Jose E.    GERD (gastroesophageal reflux disease) 05/23/2017   Hyperlipidemia 10/12/2012   IBS (irritable bowel syndrome)    chronic Diarrhea   Macular degeneration, dry    MIGRAINE HEADACHE 03/13/2010   Qualifier: Diagnosis of  By: Amon MD, Aloysius BRAVO.    Neck pain 02/03/2011   Osteoarthritis    back; goes down my right leg (09/01/2013)   Subclinical hyperthyroidism 02/26/2018   Thyromegaly 12/04/2014   Type II diabetes mellitus (HCC) 2007   Past Surgical History:  Procedure Laterality Date   CARPAL TUNNEL RELEASE Bilateral ~ 2000   cataract surgery Bilateral    TONSILLECTOMY  07/27/1968   TUBAL LIGATION  07/27/1994   Patient Active Problem List   Diagnosis Date Noted   Atrial ectopy 02/02/2023   Hypokalemia  02/02/2023   Hypomagnesemia 02/02/2023   Hypocalcemia 02/02/2023   Physical debility 08/03/2022   Chronic heart failure with preserved ejection fraction (HFpEF) (HCC) 08/03/2022   Exocrine pancreatic insufficiency 04/04/2022   Paroxysmal atrial fibrillation (HCC) 04/01/2022   Full incontinence of feces 02/15/2022   Chronic diarrhea 02/15/2022   Irritable bowel syndrome with diarrhea 02/15/2022   Nausea 02/15/2022   History of colonic polyps 02/15/2022   Chronic anticoagulation 02/15/2022   Change in bowel habits 02/15/2022   On home oxygen  therapy 02/15/2022   Acute on chronic diastolic CHF (congestive heart failure) (HCC) 01/23/2022   Chronic respiratory failure (HCC) 01/23/2022   CKD (chronic kidney disease) stage 3, GFR 30-59 ml/min (HCC) 01/23/2022   Acute respiratory failure with hypoxia (HCC) 01/10/2022   AKI (acute kidney injury) (HCC) 01/09/2022   COPD (chronic obstructive pulmonary disease) (HCC) 01/08/2022   Dizziness 04/08/2021   Combined forms of age-related cataract of left eye 04/01/2021   High cholesterol    Congestive heart failure (HCC) 12/03/2020   Osteoarthritis    IBS (irritable bowel syndrome)    Depression    Macular degeneration, dry 02/01/2020   Diabetic retinopathy (HCC) 03/31/2018   Subclinical hyperthyroidism 02/26/2018   GERD (gastroesophageal reflux disease) 05/23/2017   DJD (degenerative joint disease) 03/04/2016   PCP NOTES >>>>>>>>>>  12/02/2015   Thyromegaly 12/04/2014   Bipolar affective disorder (HCC)    Annual physical exam 07/13/2012   Neck pain 02/03/2011   Essential hypertension 09/04/2010   Ganglion 07/22/2010   MIGRAINE HEADACHE 03/13/2010   DM (diabetes mellitus), secondary uncontrolled (HCC) 07/16/2008   Type II diabetes mellitus (HCC) 2007    PCP: Amon Schanz MD   REFERRING PROVIDER: Trudy Duwaine BRAVO, NP  REFERRING DIAG:  Diagnosis  M54.50,G89.29 (ICD-10-CM) - Chronic bilateral low back pain without sciatica  M54.2  (ICD-10-CM) - Cervicalgia    Rationale for Evaluation and Treatment: Rehabilitation  THERAPY DIAG:  Other low back pain  Muscle weakness (generalized)  Other abnormalities of gait and mobility  Cervicalgia  Other symptoms and signs involving the musculoskeletal system  ONSET DATE: about 3 weeks ago   SUBJECTIVE:                                                                                                                                                                                           SUBJECTIVE STATEMENT: Pain is more of a 6-7/10 today.  A little sore at night but otherwise doing okay.  EVAL: This pain has been around for about 3 weeks, I had to wear a back brace as a teenager due to scoliosis. My neck where it meets my spine I can feel the bones rubbing against each other and I can hear it sometimes. Its the front of the shoulder and down but in my back. I have pain where my spine meets my tailbone. My hips hurt a lot today, they never hurt. Tripped on cane and fell last week, skinned knees.    PERTINENT HISTORY:  See above; pt reports hx of 3 seizures  PAIN:  Are you having pain? Yes: NPRS scale: 6-7/10 Pain location: shoulder R  Pain description: it feels like someone is operating on me without anesthsia  Aggravating factors: wearing heavy purse  Relieving factors: tylenol  (extra strength)  PRECAUTIONS: Fall  RED FLAGS: None   WEIGHT BEARING RESTRICTIONS: No  FALLS:  Has patient fallen in last 6 months? Yes. Number of falls 1; tripped over cane, no FOF I'm used to it   LIVING ENVIRONMENT: Lives with: lives with their family Lives in: House/apartment Stairs: two story home, has to go upstairs to bedroom/bathroom no chair lift  Has following equipment at home: Single point cane and home O2   OCCUPATION: disability   PLOF: Independent, Independent with basic ADLs, Independent with gait, Independent with transfers, and Requires assistive device  for independence  PATIENT GOALS: not hurt/pain control, be able to be more active and go on hikes  NEXT MD VISIT: Referring after PT   OBJECTIVE:  Note: Objective measures were completed at Evaluation unless otherwise noted.  DIAGNOSTIC FINDINGS:   FINDINGS: There is no evidence of lumbar spine fracture. Scoliosis of spine. Mild-to-moderate degenerative joint changes throughout lumbar spine with narrow intervertebral space, anterior spurring and facet joint sclerosis.   IMPRESSION: Mild-to-moderate degenerative joint changes throughout lumbar spine.  FINDINGS: Straightening of the cervical spine. Suboptimal visualization C7 and below. Normal prevertebral soft tissue thickness. Vertebral body heights are maintained. Moderate diffuse disc space narrowing C3 through C7. Dens and lateral masses are partially obscured by teeth   IMPRESSION: Straightening of the cervical spine with moderate diffuse disc space narrowing C3 through C7.    PATIENT SURVEYS:  EVAL: FOTO 36, predicted 46 in 13 visits   COGNITION: Overall cognitive status: Within functional limits for tasks assessed      POSTURE: rounded shoulders, forward head, decreased lumbar lordosis, and increased thoracic kyphosis  PALPATION:   Tight but not tender to palpation  LUMBAR ROM:   AROM eval  Flexion Severe limitation   Extension Severe limitation   Right lateral flexion Severe limitation   Left lateral flexion Severe limitation   Right rotation Severe limitation   Left rotation Severe limitation   Cervical flexion  40*  Cervical extension 7*  Cervical lateral flexion  R 7* L 13*  Cervical rotation  R 33* L 30*   (Blank rows = not tested)    LOWER EXTREMITY MMT:    MMT Right eval Left eval  Hip flexion 3 3  Hip extension    Hip abduction 2  2  Hip adduction    Hip internal rotation    Hip external rotation    Knee flexion    Knee extension 4 4  Ankle dorsiflexion 5 5  Ankle  plantarflexion    Ankle inversion    Ankle eversion     (Blank rows = not tested)  EVAL Shoulder flexion 4/5 B, shoulder ABD R 4/5 L 3/5, biceps 3+/5, triceps 2/5   08/24/23:  Rt shoulder flexion 3+/5; abduction 3/5    GAIT: Distance walked: in clinic distances  Assistive device utilized: Single point cane Level of assistance: Modified independence Comments: significant LLD noted from scoliosis   TREATMENT DATE:  09/01/23 TherEx Nustep L6 x 8 minutes LEs and LUE Standing bil Y alternating single arm x10 reps  Neuro Re-ed Seated on green balance disc for balance and proprioception: Increased time between exercises due to muscle fatigue Trunk rotation with 2# ball with outstretched arms 2x10 bil Chest press with overhead press and 2# ball 2x10 Bicep curls 2# bil 2x10 Shoulder flexion 1# bil 2x10 Shoulder abduction 1# bil 2x10   08/30/23 TherEx Nustep L6, 5 x 8 minutes LEs and LUE  Neuro Re-ed Seated on green balance disc for balance and proprioception: Bil ER with scap retraction L1 band 2x10 Bil horizontal abduction L1 band 2x10 Rows L1 band 2x10 Trunk rotation with 2# ball with outstretched arms 2x10 bil Chest press with overhead press and 2# ball 2x10 Bicep curls 2# bil 2x10 Shoulder flexion 1# bil 2x10 Shoulder abduction 1# bil 2x10  08/26/23 TherEx Nustep L6, 5 x 8 minutes LEs and LUE Row L3 band 2x10 Bil ER with scapular retraction L1 band; 2x10 - stand by A needed for balance Standing calf raises x 20 reps Standing hip abduction x 20 reps bil; L1 band Standing hip extension x 20 reps bil; L1 band Standing bil Y x10  reps  08/24/23 TherEx Nustep L6, 5 x 8 minutes LEs and LUE  Modalities Cervical traction x 10 min; 15#/10# with 60 sec/20 sec pull.    Self Care Long discussion with pt about current progress and plan of care.  She's tried exercise as well as several other modalities without improvement in pain.  Trial of traction today to see if this  is helpful and discussed possible return to see NP should symptoms not improve.                                                                                                                            PATIENT EDUCATION:  Education details: exam findings, POC, HEP  Person educated: Patient and Spouse Education method: Explanation, Demonstration, and Handouts Education comprehension: verbalized understanding, returned demonstration, verbal cues required, and needs further education  HOME EXERCISE PROGRAM:  Access Code: [email or web address] URL: [email or web address] Date: 08/16/2023 Prepared by: Josette Rough  Exercises - Seated Knee Extension with Anchored Resistance  - 1 x daily - 7 x weekly - 2 sets - 10 reps - 2 seconds  hold - Seated Single Leg Hip Abduction with Resistance  - 1 x daily - 7 x weekly - 2 sets - 10 reps - 2 seconds  hold - Seated Flexion Stretch  - 2 x daily - 7 x weekly - 1 sets - 2 reps - 30 seconds hold - Seated Cervical Retraction  - 1-2 x daily - 7 x weekly - 1 sets - 5-10 reps - 2 seconds  hold - Seated Scapular Retraction  - 2 x daily - 7 x weekly - 1 sets - 10 reps - 2 seconds  hold  ASSESSMENT:  CLINICAL IMPRESSION: Pt tolerated session well today and reporting decreased pain from prior session.  All unmet STGs ongoing at this time.  Will continue to benefit from PT to maximize function.     EVAL: Patient is a 64 y.o. F who was seen today for physical therapy evaluation and treatment for  Diagnosis  M54.50,G89.29 (ICD-10-CM) - Chronic bilateral low back pain without sciatica  M54.2 (ICD-10-CM) - Cervicalgia  . Exam with functional measures as above. Will make every effort to improve pain and improve QOL moving forward- might benefit from skilled water PT but she is not interested in this at eval.   OBJECTIVE IMPAIRMENTS: Abnormal gait, decreased activity tolerance, decreased balance, decreased mobility, difficulty walking, decreased ROM, decreased  strength, increased fascial restrictions, increased muscle spasms, impaired flexibility, improper body mechanics, postural dysfunction, and pain.   ACTIVITY LIMITATIONS: carrying, lifting, sitting, standing, sleeping, stairs, transfers, hygiene/grooming, and locomotion level  PARTICIPATION LIMITATIONS: meal prep, cleaning, laundry, shopping, community activity, and yard work  PERSONAL FACTORS: Age, Behavior pattern, Education, Fitness, Past/current experiences, Profession, Social background, and Time since onset of injury/illness/exacerbation are also affecting patient's functional outcome.   REHAB POTENTIAL: Fair chronicity of impairments, irritability/unpredictability of pain   CLINICAL DECISION MAKING: Evolving/moderate complexity  EVALUATION COMPLEXITY: Moderate  GOALS: Goals reviewed with patient? No  SHORT TERM GOALS: Target date: 08/30/2023    Will be compliant with appropriate progressive HEP  Baseline: Goal status: MET 08/26/23  2.  Lumbar AROM to be no more than moderately limited on all planes of motion   Baseline:  Goal status: ONGOING 09/01/23  3.  Cervical AROM to have improved by 10 degrees all planes of motion  Baseline:  Goal status: ONGOING 09/01/23   LONG TERM GOALS: Target date: 09/20/2023    MMT to have improved by one grade in all weak groups  Baseline:  Goal status: INITIAL  2.  Pain to be no more than 5/10 at worst  Baseline:  Goal status: INITIAL  3.  Will be able to tolerate community distance ambulation without increase in pain  Baseline:  Goal status: INITIAL  4.  FOTO score to have met or exceeded goal level by time of DC  Baseline:  Goal status: INITIAL  5.  Will be able to perform exercise as desired/appropriate in the gym without significant increase in pain  Baseline:  Goal status: INITIAL    PLAN:  PT FREQUENCY: 1-2x/week  PT DURATION: 6 weeks  PLANNED INTERVENTIONS: 97164- PT Re-evaluation, 97110-Therapeutic exercises,  97530- Therapeutic activity, 97112- Neuromuscular re-education, 97535- Self Care, 02859- Manual therapy, 97760- Orthotic Fit/training, 97014- Electrical stimulation (unattended), Taping, Dry Needling, Cryotherapy, and Moist heat.  PLAN FOR NEXT SESSION:  measure ROM,  repeat traction PRN (pt's request),  gentle mobility as tolerated, manual and DN as appropriate. Modalities as desired/appropriate. Refusing water PT for now     Corean JULIANNA Ku, PT, DPT 09/01/23 11:03 AM     Date of referral: 07/30/23 Referring provider: Duwaine Pouch NP  Referring diagnosis?  Diagnosis  M54.50,G89.29 (ICD-10-CM) - Chronic bilateral low back pain without sciatica  M54.2 (ICD-10-CM) - Cervicalgia   Treatment diagnosis? (if different than referring diagnosis)   M54.59, M54.2, M62.81, R26.89, R29.898  What was this (referring dx) caused by? Ongoing Issue  Lysle of Condition: Chronic (continuous duration > 3 months)   Laterality: Both  Current Functional Measure Score: FOTO 36  Objective measurements identify impairments when they are compared to normal values, the uninvolved extremity, and prior level of function.  [x]  Yes  []  No  Objective assessment of functional ability: Moderate functional limitations   Briefly describe symptoms: pt reports acute onset of pain, however impairments are likely chronic given PMH and severity of impairments of objective measures found today   How did symptoms start: idiopathically   Average pain intensity:  Last 24 hours: 8/10  Past week: 8/10  How often does the pt experience symptoms? Constantly  How much have the symptoms interfered with usual daily activities? Quite a bit  How has condition changed since care began at this facility? No change  In general, how is the patients overall health? Fair   BACK PAIN (STarT Back Screening Tool) Has pain spread down the leg(s) at some time in the last 2 weeks? no Has there been pain in the shoulder  or neck at some time in the last 2 weeks? yes Has the pt only walked short distances because of back pain? Yes  Has patient dressed more slowly because of back pain in the past 2 weeks? It depends on the day- sometimes yes, sometimes no  Does patient think it's not safe for a person with this condition to be physically active? yes Does patient have worrying thoughts a lot of the time? Yes  Does patient feel back pain is terrible and will never get any better? Yes Has patient stopped enjoying things they usually enjoy? yes

## 2023-09-06 ENCOUNTER — Encounter: Payer: Self-pay | Admitting: Internal Medicine

## 2023-09-06 ENCOUNTER — Ambulatory Visit (INDEPENDENT_AMBULATORY_CARE_PROVIDER_SITE_OTHER): Payer: Medicare Other | Admitting: Internal Medicine

## 2023-09-06 VITALS — BP 162/60 | HR 73 | Temp 97.9°F | Resp 18 | Ht 61.0 in | Wt 148.2 lb

## 2023-09-06 DIAGNOSIS — E119 Type 2 diabetes mellitus without complications: Secondary | ICD-10-CM

## 2023-09-06 DIAGNOSIS — I10 Essential (primary) hypertension: Secondary | ICD-10-CM | POA: Diagnosis not present

## 2023-09-06 DIAGNOSIS — Z23 Encounter for immunization: Secondary | ICD-10-CM | POA: Diagnosis not present

## 2023-09-06 MED ORDER — METOPROLOL TARTRATE 50 MG PO TABS: 75.0000 mg | ORAL_TABLET | Freq: Two times a day (BID) | ORAL | Status: DC

## 2023-09-06 MED ORDER — POTASSIUM CHLORIDE ER 10 MEQ PO TBCR: 10.0000 meq | EXTENDED_RELEASE_TABLET | Freq: Every day | ORAL | Status: DC

## 2023-09-06 MED ORDER — METOPROLOL TARTRATE 50 MG PO TABS: 75.0000 mg | ORAL_TABLET | Freq: Two times a day (BID) | ORAL | 0 refills | Status: DC

## 2023-09-06 NOTE — Assessment & Plan Note (Signed)
 DM: Saw Endo 08/26/2023, A1c: 8.6. HTN: BP is elevated today, 172/100, recheck: 162/60.  Heart rate 73. No symptoms, last creatinine stable few weeks ago.  Currently on: Amlodipine , Lasix , KCl, metoprolol . Plan: Increase metoprolol  from 50 mg BID to 75 BID. Encouraged to check BPs at home. Nurse visiting 3 weeks for BP check. Neck, lumbar spine pain: see LOV, subsequently saw orthopedics, patient was not particular interested in local injections for the back pain, was referred to physical therapy. Vaccines: Flu shot today, had a COVID booster, recommend Tdap.   RTC 3 months.

## 2023-09-06 NOTE — Patient Instructions (Addendum)
 Increase metoprolol  50 mg to: 1.5 tablets twice daily. Other medications the same  Start checking your blood pressure daily, keep a log. Blood pressure goal:  between 110/65 and  135/85. If your blood pressure is not gradually improving let me know in few days. Also call if your heart rate drops below 55.    Please go to the front desk: Arrange for a nurse visit in 3 weeks Canceled appointment you have with me later on this month and arrange for an in 3 months.  Vaccines I recommend: Tdap (tetanus)  Will refer you to an eye doctor, expect a phone call

## 2023-09-06 NOTE — Progress Notes (Signed)
 Subjective:    Patient ID: Adrienne Bennett, female    DOB: 18-Jun-1961, 63 y.o.   MRN: [id number]  DOS:  09/06/2023 Type of visit - description: Follow-up, here with her husband  The last time I saw her, she was having pain, seen by Ortho, note reviewed.  BP is elevated today, she denies chest pain, headache, no lower extremity edema. She has decided not to check her BPs at home.   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    GANGLION CYST 07/22/2010   Qualifier: Diagnosis of  By: Neomi Banks MD, Shloimy Michalski E.    GERD (gastroesophageal reflux disease) 05/23/2017   Hyperlipidemia 10/12/2012   IBS (irritable bowel syndrome)    chronic Diarrhea   Macular degeneration, dry    MIGRAINE HEADACHE 03/13/2010   Qualifier: Diagnosis of  By: Neomi Banks MD, Anitra Ket.    Neck pain 02/03/2011   Osteoarthritis    "back; goes down my right leg" (09/01/2013)   Subclinical hyperthyroidism 02/26/2018   Thyromegaly 12/04/2014   Type II diabetes mellitus (HCC) 2007    Past Surgical History:  Procedure Laterality Date   CARPAL TUNNEL RELEASE Bilateral ~ 2000   cataract surgery Bilateral    TONSILLECTOMY  07/27/1968   TUBAL LIGATION  07/27/1994    Current Outpatient Medications  Medication Instructions   albuterol  (VENTOLIN  HFA) 108 (90 Base) MCG/ACT inhaler 2 puffs, Inhalation, Every 6 hours PRN   amLODipine  (NORVASC ) 10 mg, Oral, Daily   apixaban  (ELIQUIS ) 5 mg, Oral, 2 times daily    atorvastatin  (LIPITOR) 40 mg, Oral, Daily at bedtime   baclofen  (LIORESAL ) 10 mg, Oral, 2 times daily   BREO ELLIPTA  100-25 MCG/ACT AEPB INHALE 1 PUFF INTO THE LUNGS  ONCE DAILY   clonazePAM  (KLONOPIN ) 1 mg, 2 times daily   dapagliflozin  propanediol (FARXIGA ) 10 mg, Oral, Daily   divalproex  (DEPAKOTE ) 250-500 mg, See admin instructions   DULoxetine  (CYMBALTA ) 60 mg, Every morning   Estradiol  10 MCG TABS vaginal tablet Place 1 tablet (10 mcg total) vaginally at bedtime for 14 days, THEN 1 tablet (10 mcg total) 2 (two) times a week.   furosemide  (LASIX ) 40 mg, Oral, Daily   glimepiride  (AMARYL ) 2 MG tablet 1 tablet, Daily before breakfast   ipratropium-albuterol  (DUONEB) 0.5-2.5 (3) MG/3ML SOLN 3 mLs, Nebulization, 3 times daily PRN   metoprolol  tartrate (LOPRESSOR ) 75 mg, Oral, 2 times daily   oxymetazoline  (AFRIN) 0.05 % nasal spray 1 spray, 2 times daily PRN   potassium chloride  (KLOR-CON ) 10 MEQ tablet 10 mEq, Oral, Daily   triamcinolone  lotion (KENALOG ) 0.1 % 1 Application, Topical, 3 times daily       Objective:   Physical Exam BP (!) 162/60   Pulse 73   Temp 97.9 F (36.6 C) (Oral)   Resp 18   Ht 5\' 1"  (1.549 m)   Wt 148 lb 4 oz (67.2 kg)   LMP 08/24/2015 (Exact Date)  SpO2 90%   BMI 28.01 kg/m  General:   Well developed, NAD, BMI noted. HEENT:  Normocephalic . Face symmetric, atraumatic Lungs:  CTA B Normal respiratory effort, no intercostal retractions, no accessory muscle use. Heart: RRR,  no murmur.  Lower extremities: no pretibial edema bilaterally  Skin: Not pale. Not jaundice Neurologic:  alert & oriented X3.  Speech normal, gait appropriate for age and unassisted Psych--  Cognition and judgment appear intact.  Cooperative with normal attention span and concentration.  Behavior appropriate. No anxious or depressed appearing.      Assessment   Problem list:   ENDO: started to see Dr Horris Lynn 12/2017 DM  HTN -- dc lisinopril  06-2015>> lips swell x1  , also had stomatitis >> resolved  High cholesterol GI: ---IBS (diarrhea, unable to control BMs) ---Fatty Liver per CT 04-2020 Thyroid  disease -Multinodular goiter, thyromegaly w/ dominant nodule: Negative BX 01-2015 - Hyperthyroidism, subclinical, DX 2019 --s/p thyroid  ablation 2019 --thyroid  US  04/02/2023 -- see report ? IBS.Chronic diarrhea DJD Psychiatry: Sees Dr. Levie Ream Depression, bipolar, PTSD, agoraphobiam ,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),  recovered self-mutilator, hoarder  CV-pulmonary:  CHF w/ preserved EF dx 11-2020 via stress test-echo O2 started after admission June 2023 for Resp Failure ( d/t CHF-COPD??) P- Afib dx ~ June 2023  COPD: dx ~ June  2023  Recommended deficiency  PLAN  DM: Saw Endo 08/26/2023, A1c: 8.6. HTN: BP is elevated today, 172/100, recheck: 162/60.  Heart rate 73. No symptoms, last creatinine stable few weeks ago.  Currently on: Amlodipine , Lasix , KCl, metoprolol . Plan: Increase metoprolol  from 50 mg BID to 75 BID. Encouraged to check BPs at home. Nurse visiting 3 weeks for BP check. Neck, lumbar spine pain: see LOV, subsequently saw orthopedics, patient was not particular interested in local injections for the back pain, was referred to physical therapy. Vaccines: Flu shot today, had a COVID booster, recommend Tdap.   RTC 3 months.

## 2023-09-07 ENCOUNTER — Encounter: Payer: Self-pay | Admitting: Physical Medicine and Rehabilitation

## 2023-09-07 ENCOUNTER — Ambulatory Visit: Payer: Medicare Other | Admitting: Physical Medicine and Rehabilitation

## 2023-09-07 ENCOUNTER — Encounter: Payer: Self-pay | Admitting: Physical Therapy

## 2023-09-07 ENCOUNTER — Ambulatory Visit: Payer: Medicare Other | Admitting: Physical Therapy

## 2023-09-07 VITALS — BP 190/76 | HR 67

## 2023-09-07 DIAGNOSIS — M6281 Muscle weakness (generalized): Secondary | ICD-10-CM

## 2023-09-07 DIAGNOSIS — G894 Chronic pain syndrome: Secondary | ICD-10-CM

## 2023-09-07 DIAGNOSIS — R29898 Other symptoms and signs involving the musculoskeletal system: Secondary | ICD-10-CM | POA: Diagnosis not present

## 2023-09-07 DIAGNOSIS — M5412 Radiculopathy, cervical region: Secondary | ICD-10-CM

## 2023-09-07 DIAGNOSIS — R2689 Other abnormalities of gait and mobility: Secondary | ICD-10-CM

## 2023-09-07 DIAGNOSIS — M7918 Myalgia, other site: Secondary | ICD-10-CM

## 2023-09-07 DIAGNOSIS — M5459 Other low back pain: Secondary | ICD-10-CM

## 2023-09-07 DIAGNOSIS — M542 Cervicalgia: Secondary | ICD-10-CM | POA: Diagnosis not present

## 2023-09-07 NOTE — Therapy (Signed)
OUTPATIENT PHYSICAL THERAPY THORACOLUMBAR TREATMENT   Patient Name: Adrienne Bennett MRN: [id number] DOB:12-28-1960, 63 y.o., female Today's Date: 09/07/2023  END OF SESSION:  PT End of Session - 09/07/23 1047     Visit Number 8    Number of Visits 13    Date for PT Re-Evaluation 09/20/23    Authorization Type [employer] MCR    Authorization Time Period 08/09/23 to 09/20/23    Authorization - Number of Visits 13    Progress Note Due on Visit 10    PT Start Time 1010    Activity Tolerance Patient tolerated treatment well    Behavior During Therapy [facility] for tasks assessed/performed                    Past Medical History:  Diagnosis Date   Bipolar affective disorder (HCC)    PTSD, agoraphobiam ,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    GANGLION CYST 07/22/2010   Qualifier: Diagnosis of  By: [name] MD, Jose E.    GERD (gastroesophageal reflux disease) 05/23/2017   Hyperlipidemia 10/12/2012   IBS (irritable bowel syndrome)    chronic Diarrhea   Macular degeneration, dry    MIGRAINE HEADACHE 03/13/2010   Qualifier: Diagnosis of  By: [name] MD, [name].    Neck pain 02/03/2011   Osteoarthritis    "back; goes down my right leg" (09/01/2013)   Subclinical hyperthyroidism 02/26/2018   Thyromegaly 12/04/2014   Type II diabetes mellitus (HCC) 2007   Past Surgical History:  Procedure Laterality Date   CARPAL TUNNEL RELEASE Bilateral ~ 2000   cataract surgery Bilateral    TONSILLECTOMY  07/27/1968   TUBAL LIGATION  07/27/1994   Patient Active Problem List   Diagnosis Date Noted   Atrial ectopy 02/02/2023   Hypokalemia 02/02/2023   Hypomagnesemia 02/02/2023   Hypocalcemia  02/02/2023   Physical debility 08/03/2022   Chronic heart failure with preserved ejection fraction (HFpEF) (HCC) 08/03/2022   Exocrine pancreatic insufficiency 04/04/2022   Paroxysmal atrial fibrillation (HCC) 04/01/2022   Full incontinence of feces 02/15/2022   Chronic diarrhea 02/15/2022   Irritable bowel syndrome with diarrhea 02/15/2022   Nausea 02/15/2022   History of colonic polyps 02/15/2022   Chronic anticoagulation 02/15/2022   Change in bowel habits 02/15/2022   On home oxygen therapy 02/15/2022   Acute on chronic diastolic CHF (congestive heart failure) (HCC) 01/23/2022   Chronic respiratory failure (HCC) 01/23/2022   CKD (chronic kidney disease) stage 3, GFR 30-59 ml/min (HCC) 01/23/2022   Acute respiratory failure with hypoxia (HCC) 01/10/2022   AKI (acute kidney injury) (HCC) 01/09/2022   COPD (chronic obstructive pulmonary disease) (HCC) 01/08/2022   Dizziness 04/08/2021   Combined forms of age-related cataract of left eye 04/01/2021   High cholesterol    Congestive heart failure (HCC) 12/03/2020   Osteoarthritis    IBS (irritable bowel syndrome)    Depression    Macular degeneration, dry 02/01/2020   Diabetic retinopathy (HCC) 03/31/2018   Subclinical hyperthyroidism 02/26/2018   GERD (gastroesophageal reflux disease) 05/23/2017   DJD (degenerative joint disease) 03/04/2016   PCP NOTES >>>>>>>>>> 12/02/2015   Thyromegaly 12/04/2014   Bipolar affective disorder (HCC)  Annual physical exam 07/13/2012   Neck pain 02/03/2011   Essential hypertension 09/04/2010   Ganglion 07/22/2010   MIGRAINE HEADACHE 03/13/2010   DM (diabetes mellitus), secondary uncontrolled (HCC) 07/16/2008   Type II diabetes mellitus (HCC) 2007    PCP: Willow Ora MD   REFERRING PROVIDER: Juanda Chance, NP  REFERRING DIAG:  Diagnosis  M54.50,G89.29 (ICD-10-CM) - Chronic bilateral low back pain without sciatica  M54.2 (ICD-10-CM) - Cervicalgia    Rationale for Evaluation and  Treatment: Rehabilitation  THERAPY DIAG:  Other low back pain  Muscle weakness (generalized)  Other abnormalities of gait and mobility  Cervicalgia  Other symptoms and signs involving the musculoskeletal system  ONSET DATE: "about 3 weeks ago"   SUBJECTIVE:                                                                                                                                                                                           SUBJECTIVE STATEMENT: "In pain."  EVAL: This pain has been around for about 3 weeks, I had to wear a back brace as a teenager due to scoliosis. My neck where it meets my spine I can feel the bones rubbing against each other and I can hear it sometimes. Its the front of the shoulder and down but in my back. I have pain where my spine meets my tailbone. My hips hurt a lot today, they never hurt. Tripped on cane and fell last week, skinned knees.    PERTINENT HISTORY:  See above; pt reports hx of 3 seizures  PAIN:  Are you having pain? Yes: NPRS scale: 8/10 Pain location: shoulder R  Pain description: "it feels like someone is operating on me without anesthsia"  Aggravating factors: wearing heavy purse  Relieving factors: tylenol (extra strength)  PRECAUTIONS: Fall  RED FLAGS: None   WEIGHT BEARING RESTRICTIONS: No  FALLS:  Has patient fallen in last 6 months? Yes. Number of falls 1; tripped over cane, no FOF "I'm used to it"   LIVING ENVIRONMENT: Lives with: lives with their family Lives in: House/apartment Stairs: two story home, has to go upstairs to bedroom/bathroom no chair lift  Has following equipment at home: Single point cane and home O2   OCCUPATION: disability   PLOF: Independent, Independent with basic ADLs, Independent with gait, Independent with transfers, and Requires assistive device for independence  PATIENT GOALS: not hurt/pain control, be able to be more active and go on hikes   NEXT MD VISIT: Referring after  PT   OBJECTIVE:  Note: Objective measures were completed at Evaluation unless otherwise noted.  DIAGNOSTIC FINDINGS:   FINDINGS: There  is no evidence of lumbar spine fracture. Scoliosis of spine. Mild-to-moderate degenerative joint changes throughout lumbar spine with narrow intervertebral space, anterior spurring and facet joint sclerosis.   IMPRESSION: Mild-to-moderate degenerative joint changes throughout lumbar spine.  FINDINGS: Straightening of the cervical spine. Suboptimal visualization C7 and below. Normal prevertebral soft tissue thickness. Vertebral body heights are maintained. Moderate diffuse disc space narrowing C3 through C7. Dens and lateral masses are partially obscured by teeth   IMPRESSION: Straightening of the cervical spine with moderate diffuse disc space narrowing C3 through C7.    PATIENT SURVEYS:  EVAL: FOTO 36, predicted 46 in 13 visits   COGNITION: Overall cognitive status: Within functional limits for tasks assessed      POSTURE: rounded shoulders, forward head, decreased lumbar lordosis, and increased thoracic kyphosis  PALPATION:   Tight but not tender to palpation  LUMBAR ROM:   AROM eval 09/07/23  Flexion Severe limitation  Limited 75%  Extension Severe limitation  WNL  Right lateral flexion Severe limitation  Limited 75%  Left lateral flexion Severe limitation  Limited 50%  Right rotation Severe limitation  Limited 75%  Left rotation Severe limitation  Limited 50%  Cervical flexion  40* 12  Cervical extension 7* 16  Cervical lateral flexion  R 7* L 13* Rt:5 Lt:4  Cervical rotation  R 33* L 30* R: 19 L: 21   (Blank rows = not tested)    LOWER EXTREMITY MMT:    MMT Right eval Left eval  Hip flexion 3 3  Hip extension    Hip abduction 2  2  Hip adduction    Hip internal rotation    Hip external rotation    Knee flexion    Knee extension 4 4  Ankle dorsiflexion 5 5  Ankle plantarflexion    Ankle inversion    Ankle  eversion     (Blank rows = not tested)  EVAL Shoulder flexion 4/5 B, shoulder ABD R 4/5 L 3/5, biceps 3+/5, triceps 2/5   08/24/23:  Rt shoulder flexion 3+/5; abduction 3/5    GAIT: Distance walked: in clinic distances  Assistive device utilized: Single point cane Level of assistance: Modified independence Comments: significant LLD noted from scoliosis   TREATMENT DATE:  09/07/23 TherEx Nustep L6 x 8 minutes LEs and LUE ROM measurements  Self Care Discussed clinical findings and PT recommendations; pt inquiring about surgery so briefly discussed with her and recommended she discuss in detail with physician if considering surgery.  Modalities Cervical traction x 10 min; 15#/10# with 60 sec/20 sec pull.     09/01/23 TherEx Nustep L6 x 8 minutes LEs and LUE Standing bil "Y" alternating single arm x10 reps  Neuro Re-ed Seated on green balance disc for balance and proprioception: Increased time between exercises due to muscle fatigue Trunk rotation with 2# ball with outstretched arms 2x10 bil Chest press with overhead press and 2# ball 2x10 Bicep curls 2# bil 2x10 Shoulder flexion 1# bil 2x10 Shoulder abduction 1# bil 2x10   08/30/23 TherEx Nustep L6, 5 x 8 minutes LEs and LUE  Neuro Re-ed Seated on green balance disc for balance and proprioception: Bil ER with scap retraction L1 band 2x10 Bil horizontal abduction L1 band 2x10 Rows L1 band 2x10 Trunk rotation with 2# ball with outstretched arms 2x10 bil Chest press with overhead press and 2# ball 2x10 Bicep curls 2# bil 2x10 Shoulder flexion 1# bil 2x10 Shoulder abduction 1# bil 2x10  08/26/23 TherEx Nustep L6, 5 x 8  minutes LEs and LUE Row L3 band 2x10 Bil ER with scapular retraction L1 band; 2x10 - stand by A needed for balance Standing calf raises x 20 reps Standing hip abduction x 20 reps bil; L1 band Standing hip extension x 20 reps bil; L1 band Standing bil "Y" x10 reps  08/24/23 TherEx Nustep L6, 5 x  8 minutes LEs and LUE  Modalities Cervical traction x 10 min; 15#/10# with 60 sec/20 sec pull.    Self Care Long discussion with pt about current progress and plan of care.  She's tried exercise as well as several other modalities without improvement in pain.  Trial of traction today to see if this is helpful and discussed possible return to see NP should symptoms not improve.                                                                                                                            PATIENT EDUCATION:  Education details: exam findings, POC, HEP  Person educated: Patient and Spouse Education method: Explanation, Demonstration, and Handouts Education comprehension: verbalized understanding, returned demonstration, verbal cues required, and needs further education  HOME EXERCISE PROGRAM:  Access Code: [email or web address] URL: [email or web address] Date: 08/16/2023 Prepared by: Nedra Hai  Exercises - Seated Knee Extension with Anchored Resistance  - 1 x daily - 7 x weekly - 2 sets - 10 reps - 2 seconds  hold - Seated Single Leg Hip Abduction with Resistance  - 1 x daily - 7 x weekly - 2 sets - 10 reps - 2 seconds  hold - Seated Flexion Stretch  - 2 x daily - 7 x weekly - 1 sets - 2 reps - 30 seconds hold - Seated Cervical Retraction  - 1-2 x daily - 7 x weekly - 1 sets - 5-10 reps - 2 seconds  hold - Seated Scapular Retraction  - 2 x daily - 7 x weekly - 1 sets - 10 reps - 2 seconds  hold  ASSESSMENT:  CLINICAL IMPRESSION: Pt making only minor improvements in ROM at this time and some directions have declined.  Still reporting high levels of pain.  Pt planning on MRI at this time.  Continue skilled PT.    EVAL: Patient is a 63 y.o. F who was seen today for physical therapy evaluation and treatment for  Diagnosis  M54.50,G89.29 (ICD-10-CM) - Chronic bilateral low back pain without sciatica  M54.2 (ICD-10-CM) - Cervicalgia  . Exam with functional measures as  above. Will make every effort to improve pain and improve QOL moving forward- might benefit from skilled water PT but she is not interested in this at eval.   OBJECTIVE IMPAIRMENTS: Abnormal gait, decreased activity tolerance, decreased balance, decreased mobility, difficulty walking, decreased ROM, decreased strength, increased fascial restrictions, increased muscle spasms, impaired flexibility, improper body mechanics, postural dysfunction, and pain.   ACTIVITY LIMITATIONS: carrying, lifting, sitting, standing, sleeping, stairs, transfers, hygiene/grooming, and locomotion level  PARTICIPATION LIMITATIONS: meal prep, cleaning,  laundry, shopping, community activity, and yard work  PERSONAL FACTORS: Age, Behavior pattern, Education, Fitness, Past/current experiences, Profession, Social background, and Time since onset of injury/illness/exacerbation are also affecting patient's functional outcome.   REHAB POTENTIAL: Fair chronicity of impairments, irritability/unpredictability of pain   CLINICAL DECISION MAKING: Evolving/moderate complexity  EVALUATION COMPLEXITY: Moderate   GOALS: Goals reviewed with patient? No  SHORT TERM GOALS: Target date: 08/30/2023    Will be compliant with appropriate progressive HEP  Baseline: Goal status: MET 08/26/23  2.  Lumbar AROM to be no more than moderately limited on all planes of motion   Baseline:  Goal status: ONGOING 09/01/23  3.  Cervical AROM to have improved by 10 degrees all planes of motion  Baseline:  Goal status: ONGOING 09/01/23   LONG TERM GOALS: Target date: 09/20/2023    MMT to have improved by one grade in all weak groups  Baseline:  Goal status: INITIAL  2.  Pain to be no more than 5/10 at worst  Baseline:  Goal status: INITIAL  3.  Will be able to tolerate community distance ambulation without increase in pain  Baseline:  Goal status: INITIAL  4.  FOTO score to have met or exceeded goal level by time of DC  Baseline:   Goal status: INITIAL  5.  Will be able to perform exercise as desired/appropriate in the gym without significant increase in pain  Baseline:  Goal status: INITIAL    PLAN:  PT FREQUENCY: 1-2x/week  PT DURATION: 6 weeks  PLANNED INTERVENTIONS: 97164- PT Re-evaluation, 97110-Therapeutic exercises, 97530- Therapeutic activity, 97112- Neuromuscular re-education, 97535- Self Care, 16109- Manual therapy, 97760- Orthotic Fit/training, 97014- Electrical stimulation (unattended), Taping, Dry Needling, Cryotherapy, and Moist heat.  PLAN FOR NEXT SESSION:  how was traction?,  gentle mobility as tolerated, manual and DN as appropriate. Modalities as desired/appropriate. Refusing water PT for now     Clarita Crane, PT, DPT 09/07/23 10:47 AM     Date of referral: 07/30/23 Referring provider: Ellin Goodie NP  Referring diagnosis?  Diagnosis  M54.50,G89.29 (ICD-10-CM) - Chronic bilateral low back pain without sciatica  M54.2 (ICD-10-CM) - Cervicalgia   Treatment diagnosis? (if different than referring diagnosis)   M54.59, M54.2, M62.81, R26.89, R29.898  What was this (referring dx) caused by? Ongoing Issue  Ashby Dawes of Condition: Chronic (continuous duration > 3 months)   Laterality: Both  Current Functional Measure Score: FOTO 36  Objective measurements identify impairments when they are compared to normal values, the uninvolved extremity, and prior level of function.  [x]  Yes  []  No  Objective assessment of functional ability: Moderate functional limitations   Briefly describe symptoms: pt reports acute onset of pain, however impairments are likely chronic given PMH and severity of impairments of objective measures found today   How did symptoms start: idiopathically   Average pain intensity:  Last 24 hours: 8/10  Past week: 8/10  How often does the pt experience symptoms? Constantly  How much have the symptoms interfered with usual daily activities? Quite a  bit  How has condition changed since care began at this facility? No change  In general, how is the patients overall health? Fair   BACK PAIN (STarT Back Screening Tool) Has pain spread down the leg(s) at some time in the last 2 weeks? no Has there been pain in the shoulder or neck at some time in the last 2 weeks? yes Has the pt only walked short distances because of back pain? Yes  Has patient  dressed more slowly because of back pain in the past 2 weeks? It depends on the day- sometimes yes, sometimes no  Does patient think it's not safe for a person with this condition to be physically active? yes Does patient have worrying thoughts a lot of the time? Yes  Does patient feel back pain is terrible and will never get any better? Yes Has patient stopped enjoying things they usually enjoy? yes

## 2023-09-07 NOTE — Progress Notes (Unsigned)
Pain scale-8 No Allergies to Contrast dye Takes Eliquis

## 2023-09-07 NOTE — Progress Notes (Unsigned)
 Adrienne Bennett - 63 y.o. female MRN [id number]  Date of birth: 1960-11-04  Office Visit Note: Visit Date: 09/07/2023 PCP: [name], MD Referred by: [name], MD  Subjective: Chief Complaint  Patient presents with   Neck - Pain   HPI: Adrienne Bennett is a 63 y.o. female who comes in today for evaluation of chronic, worsening and severe bilateral neck pain radiating down to both shoulders. Also reports pain and tingling sensation down right arm. Pain ongoing for several months. Her pain is most severe in the morning. She describes pain as sore and stabbing sensation, currently rates as 8 out of 10. Some relief of pain with home exercise regimen, rest and use of medications. States she was unable to tolerate Baclofen. Good short term relief with recent physical therapy/dry needling treatments. Cervical radiographs from December shows straightening of the cervical spine with moderate diffuse disc space narrowing C3 through C7. No spondylolisthesis. No prior MRI imaging of cervical spine. She reports allergic reaction to steroid injection while in the hospital in 2023. Patient denies focal weakness. No recent trauma or falls. Patent currently using cane to assist with ambulation.   We have evaluated her in the past for more lower back pain. She was not interested in injection therapy at that time. She was previously treated by [facility] in 2023, these notes can be further reviewed in EPIC.  Patient has extensive medical history including heart failure, atrial fibrillation, COPD, diabetes mellitus, CKD, bipolar disorder, and depression. Of note, there are multiple allergies/intolerances to medications listed. States she is allergic to steroid medications.       Review of Systems  Musculoskeletal:  Positive for myalgias and neck pain.  Neurological:  Negative for tingling, sensory change, focal weakness and weakness.  All other systems reviewed and are negative.   Otherwise per HPI.  Assessment & Plan: Visit Diagnoses:    ICD-10-CM   1. Radiculopathy, cervical region  M54.12 MR CERVICAL SPINE WO CONTRAST    2. Myofascial pain syndrome  M79.18 MR CERVICAL SPINE WO CONTRAST    3. Chronic pain syndrome  G89.4 MR CERVICAL SPINE WO CONTRAST       Plan: Findings:  Chronic, worsening and severe bilateral neck pain radiating down to shoulders and right arm. Paresthesias noted to right arm. Patient continues to have severe pain despite good conservative therapies such as formal physical therapy, home exercise regimen, rest and use of medications. Patients clinical presentation and exam are complex, differentials include cervical radiculopathy vs myofascial pain syndrome. I do feel there could be an underlying pain syndrome working to exacerbate her pain. We discussed treatment plan in detail today, next step is to place order for cervical MRI imaging. I will have her follow up for cervical MRI review and to discuss options. She does voice allergy to steroid medications, I am not sure epidural steroid injections are best option for her. Would also consider chronic pain management. She has no questions at this time. No red flag symptoms noted upon exam today.     Meds & Orders: No orders of the defined types were placed in this encounter.   Orders Placed This Encounter  Procedures   MR CERVICAL SPINE WO CONTRAST    Follow-up: Return for Cervical MRI review.   Procedures: No procedures performed      Clinical History: MRI SPINE LUMBAR WO IV CONTRAST.   FINDINGS:  # Osseous structures: Vertebral body heights are maintained. No  acute fracture.  #  Alignment:There is again moderate levo curvature of the lumbar spine, apex L2-3. No significant subluxation.  #  Conus medullaris/cauda equina: Conus appears normal and terminates at L1-2.   #  T12-L1: Mild degenerative changes. No focal disc herniation or significant stenosis.  #  L1-L2: Mild degenerative  changes. Tiny right paracentral disc protrusion. No significant stenosis or nerve root impingement.  #  L2-L3: There is severe DDD with asymmetric loss of disc height, worse on the right. Prominent Modic type II discogenic marrow changes. Mild generalized disc osteophyte complex. There is a small superimposed central/left paracentral disc extrusion that narrows the left lateral recess and may impinge on the traversing left L3 nerve root. Bilateral facet arthrosis. Moderate right foraminal stenosis.  #  L3-L4: There is severe DDD with loss of disc height and mild Modic type II discogenic marrow changes. Generalized disc osteophyte complex. Bilateral facet arthrosis. Moderate spinal canal/lateral recess stenosis with encroachment of the traversing L4 nerve roots within the lateral recesses. Moderate bilateral foraminal stenosis, worse on the right.  #  L4-L5: Mild to moderate degenerative changes. Generalized disc bulge and posterior hypertrophic changes result in mild central canal and moderate left lateral recess stenosis. Potential impingement of the traversing left L5 nerve root within the lateral recess. There is also moderate to severe left foraminal stenosis.  #  L5-S1: Mild/moderate degenerative changes. No focal disc herniation or significant stenosis.   #  Paraspinal tissues: Unremarkable   #  Contrast: None given.   #  Additional comments: None.    IMPRESSION:  1. Moderate levolumbar scoliosis.  2.  Multilevel DDD and facet arthrosis. Of note, there is a small central/left paracentral disc extrusion at L2-3 that narrows the left lateral recess and may impinge on the traversing left L3 nerve root. There is moderate spinal canal/lateral recess stenosis at L3-4 with encroachment of the traversing L4 nerve roots. Narrowing of the left lateral recess at L4-5 with encroachment of the traversing left L5 nerve roots. Foraminal stenosis on the right at L2-3, bilaterally at L3-4 and on the left at  L4-5.   Electronically Signed by: Angie Fava, MD on 04/03/2022 11:33 AM   She reports that she has never smoked. She has never used smokeless tobacco.  Recent Labs    02/17/23 0000 05/25/23 0000 08/26/23 0000  HGBA1C 10.5 9.1 8.6    Objective:  VS:  HT:    WT:   BMI:     BP:(!) 190/76  HR:67bpm  TEMP: ( )  RESP:  Physical Exam Vitals and nursing note reviewed.  HENT:     Head: Normocephalic and atraumatic.     Right Ear: External ear normal.     Left Ear: External ear normal.     Nose: Nose normal.     Mouth/Throat:     Mouth: Mucous membranes are moist.  Eyes:     Extraocular Movements: Extraocular movements intact.  Cardiovascular:     Rate and Rhythm: Normal rate.     Pulses: Normal pulses.  Pulmonary:     Effort: Pulmonary effort is normal.  Abdominal:     General: Abdomen is flat. There is no distension.  Musculoskeletal:        General: Tenderness present.     Cervical back: Tenderness present.     Comments: No discomfort noted with flexion, extension and side-to-side rotation. Patient has good strength in the upper extremities including 5 out of 5 strength in wrist extension, long finger  flexion and APB. Shoulder range of motion is full bilaterally without any sign of impingement. There is no atrophy of the hands intrinsically. Sensation intact bilaterally. Myofascial tenderness noted to bilateral levator scapulae and trapezius regions. Negative Hoffman's sign. Negative Spurling's sign.     Skin:    General: Skin is warm and dry.     Capillary Refill: Capillary refill takes less than 2 seconds.  Neurological:     General: No focal deficit present.     Mental Status: She is alert and oriented to person, place, and time.  Psychiatric:        Mood and Affect: Mood normal.        Behavior: Behavior normal.     Ortho Exam  Imaging: No results found.  Past Medical/Family/Surgical/Social History: Medications & Allergies reviewed per EMR, new medications  updated. Patient Active Problem List   Diagnosis Date Noted   Atrial ectopy 02/02/2023   Hypokalemia 02/02/2023   Hypomagnesemia 02/02/2023   Hypocalcemia 02/02/2023   Physical debility 08/03/2022   Chronic heart failure with preserved ejection fraction (HFpEF) (HCC) 08/03/2022   Exocrine pancreatic insufficiency 04/04/2022   Paroxysmal atrial fibrillation (HCC) 04/01/2022   Full incontinence of feces 02/15/2022   Chronic diarrhea 02/15/2022   Irritable bowel syndrome with diarrhea 02/15/2022   Nausea 02/15/2022   History of colonic polyps 02/15/2022   Chronic anticoagulation 02/15/2022   Change in bowel habits 02/15/2022   On home oxygen therapy 02/15/2022   Acute on chronic diastolic CHF (congestive heart failure) (HCC) 01/23/2022   Chronic respiratory failure (HCC) 01/23/2022   CKD (chronic kidney disease) stage 3, GFR 30-59 ml/min (HCC) 01/23/2022   Acute respiratory failure with hypoxia (HCC) 01/10/2022   AKI (acute kidney injury) (HCC) 01/09/2022   COPD (chronic obstructive pulmonary disease) (HCC) 01/08/2022   Dizziness 04/08/2021   Combined forms of age-related cataract of left eye 04/01/2021   High cholesterol    Congestive heart failure (HCC) 12/03/2020   Osteoarthritis    IBS (irritable bowel syndrome)    Depression    Macular degeneration, dry 02/01/2020   Diabetic retinopathy (HCC) 03/31/2018   Subclinical hyperthyroidism 02/26/2018   GERD (gastroesophageal reflux disease) 05/23/2017   DJD (degenerative joint disease) 03/04/2016   PCP NOTES >>>>>>>>>> 12/02/2015   Thyromegaly 12/04/2014   Bipolar affective disorder (HCC)    Annual physical exam 07/13/2012   Neck pain 02/03/2011   Essential hypertension 09/04/2010   Ganglion 07/22/2010   MIGRAINE HEADACHE 03/13/2010   DM (diabetes mellitus), secondary uncontrolled (HCC) 07/16/2008   Type II diabetes mellitus (HCC) 2007   Past Medical History:  Diagnosis Date   Bipolar affective disorder (HCC)    PTSD,  agoraphobiam ,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    GANGLION CYST 07/22/2010   Qualifier: Diagnosis of  By: [name] MD, Jose E.    GERD (gastroesophageal reflux disease) 05/23/2017   Hyperlipidemia 10/12/2012   IBS (irritable bowel syndrome)    chronic Diarrhea   Macular degeneration, dry    MIGRAINE HEADACHE 03/13/2010   Qualifier: Diagnosis of  By: [name] MD, [name].    Neck pain 02/03/2011   Osteoarthritis    "back; goes down my right leg" (09/01/2013)  Subclinical hyperthyroidism 02/26/2018   Thyromegaly 12/04/2014   Type II diabetes mellitus (HCC) 2007   Family History  Problem Relation Age of Onset   Diabetes Mother    Coronary artery disease Father        age? 50s?   Diabetes Sister    CAD Sister    Hypertension Sister    Coronary artery disease Brother        age?, 55s?   Hypertension Brother    Diabetes Maternal Grandmother    Colon cancer Neg Hx    Breast cancer Neg Hx    Stomach cancer Neg Hx    Rectal cancer Neg Hx    Esophageal cancer Neg Hx    Inflammatory bowel disease Neg Hx    Liver disease Neg Hx    Pancreatic cancer Neg Hx    Past Surgical History:  Procedure Laterality Date   CARPAL TUNNEL RELEASE Bilateral ~ 2000   cataract surgery Bilateral    TONSILLECTOMY  07/27/1968   TUBAL LIGATION  07/27/1994   Social History   Occupational History   Occupation: stay home  Tobacco Use   Smoking status: Never   Smokeless tobacco: Never  Vaping Use   Vaping status: Never Used  Substance and Sexual Activity   Alcohol use: No    Alcohol/week: 0.0 standard drinks of alcohol   Drug use: No   Sexual activity: Yes

## 2023-09-09 ENCOUNTER — Ambulatory Visit: Payer: Medicare Other | Admitting: Rehabilitative and Restorative Service Providers"

## 2023-09-09 ENCOUNTER — Encounter: Payer: Self-pay | Admitting: Rehabilitative and Restorative Service Providers"

## 2023-09-09 DIAGNOSIS — M5459 Other low back pain: Secondary | ICD-10-CM

## 2023-09-09 DIAGNOSIS — M6281 Muscle weakness (generalized): Secondary | ICD-10-CM

## 2023-09-09 DIAGNOSIS — M542 Cervicalgia: Secondary | ICD-10-CM

## 2023-09-09 DIAGNOSIS — R2689 Other abnormalities of gait and mobility: Secondary | ICD-10-CM | POA: Diagnosis not present

## 2023-09-09 DIAGNOSIS — R29898 Other symptoms and signs involving the musculoskeletal system: Secondary | ICD-10-CM | POA: Diagnosis not present

## 2023-09-09 NOTE — Therapy (Addendum)
 OUTPATIENT PHYSICAL THERAPY THORACOLUMBAR TREATMENT  PHYSICAL THERAPY DISCHARGE SUMMARY  Visits from Start of Care: 9  Current functional level related to goals / functional outcomes: See note   Remaining deficits: See note   Education / Equipment: HEP   Patient agrees to discharge. Patient goals were Ongoing. Patient is being discharged due to a change in medical status.   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    GANGLION CYST 07/22/2010   Qualifier: Diagnosis of  By: Amon MD, Jose E.    GERD (gastroesophageal reflux disease) 05/23/2017   Hyperlipidemia 10/12/2012   IBS (irritable bowel syndrome)    chronic Diarrhea   Macular degeneration, dry    MIGRAINE HEADACHE 03/13/2010   Qualifier: Diagnosis of  By: Amon MD, Aloysius BRAVO.    Neck pain 02/03/2011   Osteoarthritis    back; goes down my right leg (09/01/2013)   Subclinical hyperthyroidism 02/26/2018   Thyromegaly 12/04/2014   Type II diabetes mellitus (HCC) 2007   Past Surgical History:  Procedure Laterality Date   CARPAL TUNNEL RELEASE Bilateral ~ 2000   cataract surgery Bilateral    TONSILLECTOMY  07/27/1968   TUBAL LIGATION  07/27/1994   Patient Active Problem List   Diagnosis Date Noted   Atrial ectopy 02/02/2023   Hypokalemia 02/02/2023   Hypomagnesemia 02/02/2023   Hypocalcemia 02/02/2023   Physical debility 08/03/2022   Chronic heart failure with preserved ejection fraction (HFpEF)  (HCC) 08/03/2022   Exocrine pancreatic insufficiency 04/04/2022   Paroxysmal atrial fibrillation (HCC) 04/01/2022   Full incontinence of feces 02/15/2022   Chronic diarrhea 02/15/2022   Irritable bowel syndrome with diarrhea 02/15/2022   Nausea 02/15/2022   History of colonic polyps 02/15/2022   Chronic anticoagulation 02/15/2022   Change in bowel habits 02/15/2022   On home oxygen  therapy 02/15/2022   Acute on chronic diastolic CHF (congestive heart failure) (HCC) 01/23/2022   Chronic respiratory failure (HCC) 01/23/2022   CKD (chronic kidney disease) stage 3, GFR 30-59 ml/min (HCC) 01/23/2022   Acute respiratory failure with hypoxia (HCC) 01/10/2022   AKI (acute kidney injury) 01/09/2022   COPD (chronic obstructive pulmonary disease) (HCC) 01/08/2022   Dizziness 04/08/2021   Combined forms of age-related cataract of left eye 04/01/2021   High cholesterol    Congestive heart failure (HCC) 12/03/2020   Osteoarthritis    IBS (irritable bowel syndrome)    Depression    Macular degeneration, dry 02/01/2020   Diabetic retinopathy (HCC) 03/31/2018   Subclinical hyperthyroidism 02/26/2018   GERD (gastroesophageal reflux disease) 05/23/2017   DJD (degenerative joint disease) 03/04/2016   PCP NOTES >>>>>>>>>> 12/02/2015   Thyromegaly 12/04/2014   Bipolar affective disorder (HCC)    Annual physical exam 07/13/2012   Neck pain 02/03/2011   Essential hypertension 09/04/2010   Ganglion 07/22/2010   MIGRAINE HEADACHE 03/13/2010   DM (diabetes mellitus), secondary uncontrolled (HCC) 07/16/2008   Type II diabetes mellitus (HCC) 2007    PCP: Amon Aloysius MD  REFERRING PROVIDER: Trudy Duwaine BRAVO, NP  REFERRING DIAG:  Diagnosis  M54.50,G89.29 (ICD-10-CM) - Chronic bilateral low back pain without sciatica  M54.2 (ICD-10-CM) - Cervicalgia    Rationale for Evaluation and Treatment: Rehabilitation  THERAPY DIAG:  Other low back pain  Muscle weakness (generalized)  Other  abnormalities of gait and mobility  Cervicalgia  Other symptoms and signs involving the musculoskeletal system  ONSET DATE: about 3 weeks ago   SUBJECTIVE:                                                                                                                                                                                           SUBJECTIVE STATEMENT: Dillan notes minimal progress since starting physical therapy.  Her right sided radicular symptoms are still irritating and she is starting to get symptoms on the left side as well.  MRI is scheduled 09/24/2023 for her cervical spine.  EVAL: This pain has been around for about 3 weeks, I had to wear a back brace as a teenager due to scoliosis. My neck where it meets my spine I can feel the bones rubbing against each other and I can hear it sometimes. Its the front of the shoulder and down but in my back. I have pain where my spine meets my tailbone. My hips hurt a lot today, they never hurt. Tripped on cane and fell last week, skinned knees.    PERTINENT HISTORY:  See above; pt reports hx of 3 seizures  PAIN:  Are you having pain? Yes: NPRS scale: Neck 7-8/10 this week, 0-3/10 (more asleep than pain, low back 0-8/10 Pain location: See above  Pain description: it feels like someone is operating on me without anesthsia  Aggravating factors: Getting up from the couch increases right upper extremity paresthesias, wearing heavy purse  Relieving factors: tylenol  (extra strength) knocks her out 1 in the morning and 2 at night  PRECAUTIONS: Fall  RED FLAGS: None   WEIGHT BEARING RESTRICTIONS: No  FALLS:  Has patient fallen in last 6 months? Yes. Number of falls 1; tripped over cane, no FOF I'm used to it   LIVING ENVIRONMENT: Lives with: lives with their family Lives in: House/apartment Stairs: two story home, has to go upstairs to bedroom/bathroom no chair lift  Has following equipment at home: Single point cane and  home O2   OCCUPATION: disability   PLOF: Independent, Independent with basic ADLs, Independent with gait, Independent with transfers, and Requires assistive device for independence  PATIENT GOALS: not hurt/pain control, be able to be more active and go on hikes   NEXT MD VISIT: Referring after PT  OBJECTIVE:  Note: Objective measures were completed at Evaluation unless otherwise noted.  DIAGNOSTIC FINDINGS:   FINDINGS: There is no evidence of lumbar spine fracture. Scoliosis of spine. Mild-to-moderate degenerative joint changes throughout lumbar spine with narrow intervertebral space, anterior spurring and facet joint sclerosis.   IMPRESSION: Mild-to-moderate degenerative joint changes throughout lumbar spine.  FINDINGS: Straightening of the cervical spine. Suboptimal visualization C7 and below. Normal prevertebral soft tissue thickness. Vertebral body heights are maintained. Moderate diffuse disc space narrowing C3 through C7. Dens and lateral masses are partially obscured by teeth   IMPRESSION: Straightening of the cervical spine with moderate diffuse disc space narrowing C3 through C7.    PATIENT SURVEYS:  EVAL: FOTO 36, predicted 46 in 13 visits   COGNITION: Overall cognitive status: Within functional limits for tasks assessed      POSTURE: rounded shoulders, forward head, decreased lumbar lordosis, and increased thoracic kyphosis  PALPATION:   Tight but not tender to palpation  LUMBAR ROM:   AROM eval 09/07/23  Flexion Severe limitation  Limited 75%  Extension Severe limitation  WNL  Right lateral flexion Severe limitation  Limited 75%  Left lateral flexion Severe limitation  Limited 50%  Right rotation Severe limitation  Limited 75%  Left rotation Severe limitation  Limited 50%  Cervical flexion  40* 12  Cervical extension 7* 16  Cervical lateral flexion  R 7* L 13* Rt:5 Lt:4  Cervical rotation  R 33* L 30* R: 19 L: 21   (Blank rows = not  tested)    LOWER EXTREMITY MMT:    MMT Right eval Left eval  Hip flexion 3 3  Hip extension    Hip abduction 2  2  Hip adduction    Hip internal rotation    Hip external rotation    Knee flexion    Knee extension 4 4  Ankle dorsiflexion 5 5  Ankle plantarflexion    Ankle inversion    Ankle eversion     (Blank rows = not tested)  EVAL Shoulder flexion 4/5 B, shoulder ABD R 4/5 L 3/5, biceps 3+/5, triceps 2/5   08/24/23:  Rt shoulder flexion 3+/5; abduction 3/5    GAIT: Distance walked: in clinic distances  Assistive device utilized: Single point cane Level of assistance: Modified independence Comments: significant LLD noted from scoliosis   TREATMENT DATE:  09/09/2023 No more cervical traction Therapeutic exercises: Cervical Extension Isometrics 10 x 5 seconds Lumbar extension AROM 10 x 3 seconds  Neuromuscular re-education: Postural correction, shoulder blade pinches 10 x 5 seconds  Functional Activities: For checking mirrors in the car cervical rotation AROM 10 x 5 seconds (shoulders back 1st) Reviewed imaging, discussed postural basics, ways to reduce muscle spasm, the importance of changing positions   09/07/23 TherEx Nustep L6 x 8 minutes LEs and LUE ROM measurements  Self Care Discussed clinical findings and PT recommendations; pt inquiring about surgery so briefly discussed with her and recommended she discuss in detail with physician if considering surgery.  Modalities Cervical traction x 10 min; 15#/10# with 60 sec/20 sec pull.     09/01/23 TherEx Nustep L6 x 8 minutes LEs and LUE Standing bil Y alternating single arm x10 reps  Neuro Re-ed Seated on green balance disc for balance and proprioception: Increased time between exercises due to muscle fatigue Trunk rotation with 2# ball with outstretched arms 2x10 bil Chest press with overhead press and 2# ball 2x10 Bicep curls 2# bil 2x10 Shoulder flexion 1# bil 2x10 Shoulder abduction  1#  bil 2x10   PATIENT EDUCATION:  Education details: exam findings, POC, HEP  Person educated: Patient and Spouse Education method: Explanation, Demonstration, and Handouts Education comprehension: verbalized understanding, returned demonstration, verbal cues required, and needs further education  HOME EXERCISE PROGRAM: Access Code: [email or web address] URL: [email or web address] Date: 09/09/2023 Prepared by: Lamar Ivory  Exercises - Seated Knee Extension with Anchored Resistance  - 1 x daily - 7 x weekly - 2 sets - 10 reps - 2 seconds  hold - Seated Single Leg Hip Abduction with Resistance  - 1 x daily - 7 x weekly - 2 sets - 10 reps - 2 seconds  hold - Seated Scapular Retraction  - 5-10 x daily - 7 x weekly - 1 sets - 3-5 reps - 5 seconds  hold - Seated Cervical Rotation AROM  - 3-5 x daily - 7 x weekly - 1 sets - 10 reps - 5 seconds hold - Standing Isometric Cervical Extension with Manual Resistance  - 3-5 x daily - 7 x weekly - 1 sets - 5 reps - 5 seconds hold - Standing Lumbar Extension at Wall - Forearms  - 5-10 x daily - 7 x weekly - 1 sets - 3-5 reps - 3 seconds hold  ASSESSMENT:  CLINICAL IMPRESSION: Lexii does not want to do traction or any flexion activities again.  She gets about 3 hours of relief with dry needling.  Today focused on education, postural correction and techniques to quiet spasm, improve cervical AROM, strength and reduce radicular symptoms.  She was encouraged to schedule additional PT visits up to her MRI 09/24/2023.  Drusilla will decide on whether or not to continue PT after her MRI.  I will keep her chart open until she makes that decision.   EVAL: Patient is a 63 y.o. F who was seen today for physical therapy evaluation and treatment for  Diagnosis  M54.50,G89.29 (ICD-10-CM) - Chronic bilateral low back pain without sciatica  M54.2 (ICD-10-CM) - Cervicalgia  . Exam with functional measures as above. Will make every effort to improve pain and  improve QOL moving forward- might benefit from skilled water PT but she is not interested in this at eval.   OBJECTIVE IMPAIRMENTS: Abnormal gait, decreased activity tolerance, decreased balance, decreased mobility, difficulty walking, decreased ROM, decreased strength, increased fascial restrictions, increased muscle spasms, impaired flexibility, improper body mechanics, postural dysfunction, and pain.   ACTIVITY LIMITATIONS: carrying, lifting, sitting, standing, sleeping, stairs, transfers, hygiene/grooming, and locomotion level  PARTICIPATION LIMITATIONS: meal prep, cleaning, laundry, shopping, community activity, and yard work  PERSONAL FACTORS: Age, Behavior pattern, Education, Fitness, Past/current experiences, Profession, Social background, and Time since onset of injury/illness/exacerbation are also affecting patient's functional outcome.   REHAB POTENTIAL: Fair chronicity of impairments, irritability/unpredictability of pain   CLINICAL DECISION MAKING: Evolving/moderate complexity  EVALUATION COMPLEXITY: Moderate   GOALS: Goals reviewed with patient? No  SHORT TERM GOALS: Target date: 08/30/2023    Will be compliant with appropriate progressive HEP  Baseline: Goal status: MET 08/26/23  2.  Lumbar AROM to be no more than moderately limited on all planes of motion   Baseline:  Goal status: ONGOING 09/09/23  3.  Cervical AROM to have improved by 10 degrees all planes of motion  Baseline:  Goal status: ONGOING 09/09/23   LONG TERM GOALS: Target date: 09/20/2023    MMT to have improved by one grade in all weak groups  Baseline:  Goal status: INITIAL  2.  Pain to be no more than  5/10 at worst  Baseline:  Goal status: On Going 09/09/2023  3.  Will be able to tolerate community distance ambulation without increase in pain  Baseline:  Goal status: On Going 09/09/2023  4.  FOTO score to have met or exceeded goal level by time of DC  Baseline:  Goal status: INITIAL  5.   Will be able to perform exercise as desired/appropriate in the gym without significant increase in pain  Baseline:  Goal status: On Going 09/09/2023    PLAN:  PT FREQUENCY: 1-2x/week  PT DURATION: 2 additional weeks  PLANNED INTERVENTIONS: 97164- PT Re-evaluation, 97110-Therapeutic exercises, 97530- Therapeutic activity, 97112- Neuromuscular re-education, 97535- Self Care, 02859- Manual therapy, 97760- Orthotic Fit/training, 97014- Electrical stimulation (unattended), Taping, Dry Needling, Cryotherapy, and Moist heat.  PLAN FOR NEXT SESSION:  Postural correction, scapular retraction, cervical AROM and strength activities in a good (shoulders back) posture.   Myer LELON Ivory PT, MPT 06/20/24 1:16 PM     Date of referral: 07/30/23 Referring provider: Duwaine Pouch NP  Referring diagnosis?  Diagnosis  M54.50,G89.29 (ICD-10-CM) - Chronic bilateral low back pain without sciatica  M54.2 (ICD-10-CM) - Cervicalgia   Treatment diagnosis? (if different than referring diagnosis)   M54.59, M54.2, M62.81, R26.89, R29.898  What was this (referring dx) caused by? Ongoing Issue  Lysle of Condition: Chronic (continuous duration > 3 months)   Laterality: Both  Current Functional Measure Score: FOTO 36  Objective measurements identify impairments when they are compared to normal values, the uninvolved extremity, and prior level of function.  [x]  Yes  []  No  Objective assessment of functional ability: Moderate functional limitations   Briefly describe symptoms: pt reports acute onset of pain, however impairments are likely chronic given PMH and severity of impairments of objective measures found today   How did symptoms start: idiopathically   Average pain intensity:  Last 24 hours: 8/10  Past week: 8/10  How often does the pt experience symptoms? Constantly  How much have the symptoms interfered with usual daily activities? Quite a bit  How has condition changed since care  began at this facility? No change  In general, how is the patients overall health? Fair   BACK PAIN (STarT Back Screening Tool) Has pain spread down the leg(s) at some time in the last 2 weeks? no Has there been pain in the shoulder or neck at some time in the last 2 weeks? yes Has the pt only walked short distances because of back pain? Yes  Has patient dressed more slowly because of back pain in the past 2 weeks? It depends on the day- sometimes yes, sometimes no  Does patient think it's not safe for a person with this condition to be physically active? yes Does patient have worrying thoughts a lot of the time? Yes  Does patient feel back pain is terrible and will never get any better? Yes Has patient stopped enjoying things they usually enjoy? yes

## 2023-09-15 ENCOUNTER — Other Ambulatory Visit: Payer: Self-pay | Admitting: Internal Medicine

## 2023-09-15 ENCOUNTER — Telehealth: Payer: Self-pay

## 2023-09-15 NOTE — Telephone Encounter (Signed)
 Patient was identified as falling into the [place] Measure - Diabetes.   Patient was: Appointment scheduled for lab or office visit for A1c.   Patient recently seen by Endo and PCP, to f/u with both in May 2025.

## 2023-09-22 ENCOUNTER — Encounter: Payer: Self-pay | Admitting: Internal Medicine

## 2023-09-22 ENCOUNTER — Ambulatory Visit: Payer: Self-pay | Admitting: Internal Medicine

## 2023-09-22 NOTE — Telephone Encounter (Signed)
 Copied from CRM [id number]. Topic: Clinical - Red Word Triage >> Sep 22, 2023  1:29 PM [name] wrote: Red Word that prompted transfer to Nurse Triage: Blood Pressure >> Sep 22, 2023  1:32 PM [name] wrote: Patient states the highest her blood pressure has been is 217/83, the lowest has been 165/85 it's been this way for a week in half.   Chief Complaint: BP 's ranging 165/85 to 217/83 Symptoms: none Frequency: week and a half  Pertinent Negatives: Patient denies chest pain SOB Disposition: [] ED /[] Urgent Care (no appt availability in office) / [] Appointment(In office/virtual)/ []  [facility]/ [] Home Care/ [] Refused Recommended Disposition /[] [facility]/ [x]  Follow-up with PCP Additional Notes: pt didn't not want to make appt at this time but wanted PCP to know and call her back on [phone] number.  Reason for Disposition . [1] Systolic BP  >= 200 OR Diastolic >= 120 AND [2] having NO cardiac or neurologic symptoms  Answer Assessment - Initial Assessment Questions 1. BLOOD PRESSURE: "What is the blood pressure?" "Did you take at least two measurements 5 minutes apart?"     217/83 165/85 2. ONSET: "When did you take your blood pressure?"  daily 3. HOW: "How did you take your blood pressure?" (e.g., automatic home BP monitor, visiting nurse)     Auto home BP  4. HISTORY: "Do you have a history of high blood pressure?"     yes 5. MEDICINES: "Are you taking any medicines for blood pressure?" "Have you missed any doses recently?"     yes 6. OTHER SYMPTOMS: "Do you have any symptoms?" (e.g., blurred vision, chest pain, difficulty breathing, headache, weakness)     When gets upset chest gets tight, 80' %  Protocols used: Blood Pressure - High-A-AH

## 2023-09-23 ENCOUNTER — Emergency Department (HOSPITAL_COMMUNITY)
Admission: EM | Admit: 2023-09-23 | Discharge: 2023-09-23 | Disposition: A | Discharge status: Home / Self Care | Payer: Medicare Other | Attending: Emergency Medicine | Admitting: Emergency Medicine

## 2023-09-23 ENCOUNTER — Emergency Department (HOSPITAL_COMMUNITY): Payer: Medicare Other

## 2023-09-23 ENCOUNTER — Telehealth: Payer: Self-pay | Admitting: Internal Medicine

## 2023-09-23 DIAGNOSIS — R918 Other nonspecific abnormal finding of lung field: Secondary | ICD-10-CM | POA: Diagnosis not present

## 2023-09-23 DIAGNOSIS — I110 Hypertensive heart disease with heart failure: Secondary | ICD-10-CM | POA: Diagnosis not present

## 2023-09-23 DIAGNOSIS — E119 Type 2 diabetes mellitus without complications: Secondary | ICD-10-CM | POA: Diagnosis not present

## 2023-09-23 DIAGNOSIS — R0602 Shortness of breath: Secondary | ICD-10-CM | POA: Diagnosis present

## 2023-09-23 DIAGNOSIS — J449 Chronic obstructive pulmonary disease, unspecified: Secondary | ICD-10-CM | POA: Insufficient documentation

## 2023-09-23 DIAGNOSIS — I7121 Aneurysm of the ascending aorta, without rupture: Secondary | ICD-10-CM | POA: Diagnosis not present

## 2023-09-23 DIAGNOSIS — Z79899 Other long term (current) drug therapy: Secondary | ICD-10-CM | POA: Diagnosis not present

## 2023-09-23 DIAGNOSIS — R0902 Hypoxemia: Secondary | ICD-10-CM | POA: Diagnosis not present

## 2023-09-23 DIAGNOSIS — I509 Heart failure, unspecified: Secondary | ICD-10-CM | POA: Diagnosis not present

## 2023-09-23 DIAGNOSIS — Z7984 Long term (current) use of oral hypoglycemic drugs: Secondary | ICD-10-CM | POA: Diagnosis not present

## 2023-09-23 DIAGNOSIS — I517 Cardiomegaly: Secondary | ICD-10-CM | POA: Diagnosis not present

## 2023-09-23 DIAGNOSIS — J90 Pleural effusion, not elsewhere classified: Secondary | ICD-10-CM | POA: Diagnosis not present

## 2023-09-23 DIAGNOSIS — I10 Essential (primary) hypertension: Secondary | ICD-10-CM

## 2023-09-23 DIAGNOSIS — J984 Other disorders of lung: Secondary | ICD-10-CM | POA: Diagnosis not present

## 2023-09-23 DIAGNOSIS — J9811 Atelectasis: Secondary | ICD-10-CM | POA: Diagnosis not present

## 2023-09-23 LAB — CBC WITH DIFFERENTIAL/PLATELET
Abs Immature Granulocytes: 0.02 10*3/uL (ref 0.00–0.07)
Basophils Absolute: 0 10*3/uL (ref 0.0–0.1)
Basophils Relative: 1 %
Eosinophils Absolute: 0.1 10*3/uL (ref 0.0–0.5)
Eosinophils Relative: 2 %
HCT: 38.6 % (ref 36.0–46.0)
Hemoglobin: 12.3 g/dL (ref 12.0–15.0)
Immature Granulocytes: 0 %
Lymphocytes Relative: 28 %
Lymphs Abs: 2.4 10*3/uL (ref 0.7–4.0)
MCH: 30.7 pg (ref 26.0–34.0)
MCHC: 31.9 g/dL (ref 30.0–36.0)
MCV: 96.3 fL (ref 80.0–100.0)
Monocytes Absolute: 0.6 10*3/uL (ref 0.1–1.0)
Monocytes Relative: 8 %
Neutro Abs: 5.1 10*3/uL (ref 1.7–7.7)
Neutrophils Relative %: 61 %
Platelets: 248 10*3/uL (ref 150–400)
RBC: 4.01 MIL/uL (ref 3.87–5.11)
RDW: 14.7 % (ref 11.5–15.5)
WBC: 8.3 10*3/uL (ref 4.0–10.5)
nRBC: 0 % (ref 0.0–0.2)

## 2023-09-23 LAB — BRAIN NATRIURETIC PEPTIDE: B Natriuretic Peptide: 443.1 pg/mL — ABNORMAL HIGH (ref 0.0–100.0)

## 2023-09-23 LAB — URINALYSIS, ROUTINE W REFLEX MICROSCOPIC
Bilirubin Urine: NEGATIVE
Glucose, UA: >=500 mg/dL — AB
Ketones, ur: NEGATIVE mg/dL
Nitrite: NEGATIVE
Protein, ur: 100 mg/dL — AB
Specific Gravity, Urine: 1.007 (ref 1.005–1.030)
pH: 5 (ref 5.0–8.0)

## 2023-09-23 LAB — BLOOD GAS, VENOUS
Acid-Base Excess: 11 mmol/L — ABNORMAL HIGH (ref 0.0–2.0)
Bicarbonate: 38.4 mmol/L — ABNORMAL HIGH (ref 20.0–28.0)
O2 Saturation: 59.5 %
Patient temperature: 37
pCO2, Ven: 62 mmHg — ABNORMAL HIGH (ref 44–60)
pH, Ven: 7.4 (ref 7.25–7.43)
pO2, Ven: 35 mmHg (ref 32–45)

## 2023-09-23 LAB — BASIC METABOLIC PANEL
Anion gap: 11 (ref 5–15)
BUN: 33 mg/dL — ABNORMAL HIGH (ref 8–23)
CO2: 30 mmol/L (ref 22–32)
Calcium: 8.3 mg/dL — ABNORMAL LOW (ref 8.9–10.3)
Chloride: 98 mmol/L (ref 98–111)
Creatinine, Ser: 1.31 mg/dL — ABNORMAL HIGH (ref 0.44–1.00)
GFR, Estimated: 46 mL/min — ABNORMAL LOW (ref >60)
Glucose, Bld: 134 mg/dL — ABNORMAL HIGH (ref 70–99)
Potassium: 3.2 mmol/L — ABNORMAL LOW (ref 3.5–5.1)
Sodium: 139 mmol/L (ref 135–145)

## 2023-09-23 LAB — TROPONIN I (HIGH SENSITIVITY)
Troponin I (High Sensitivity): 11 ng/L (ref <18)
Troponin I (High Sensitivity): 11 ng/L (ref <18)

## 2023-09-23 MED ORDER — FUROSEMIDE 10 MG/ML IJ SOLN
40.0000 mg | Freq: Once | INTRAMUSCULAR | Status: AC
  Administered 2023-09-23: 40 mg via INTRAVENOUS
  Filled 2023-09-23: qty 4

## 2023-09-23 MED ORDER — POTASSIUM CHLORIDE CRYS ER 20 MEQ PO TBCR
40.0000 meq | EXTENDED_RELEASE_TABLET | Freq: Once | ORAL | Status: AC
Start: 2023-09-23 — End: 2023-09-23
  Administered 2023-09-23: 40 meq via ORAL
  Filled 2023-09-23: qty 2

## 2023-09-23 NOTE — ED Provider Triage Note (Signed)
 Emergency Medicine Provider Triage Evaluation Note  Adrienne Bennett , a 63 y.o. female  was evaluated in triage.  Pt complains of elevated BP x 2 wks, low O2. Pt not wearing her O2 and improved when placed on 2L as prescibed.  Review of Systems  Positive: HTN Negative: Fever, chills, CP, SHOB  Physical Exam  LMP 08/24/2015 (Exact Date)  Gen:   Awake, no distress   Resp:  Normal effort  MSK:   Moves extremities without difficulty  Other:    Medical Decision Making  Medically screening exam initiated at 3:55 PM.  Appropriate orders placed.  Adrienne Bennett was informed that the remainder of the evaluation will be completed by another provider, this initial triage assessment does not replace that evaluation, and the importance of remaining in the ED until their evaluation is complete.  Labs and imaging ordered   Adrienne Bennett 09/23/23 1558

## 2023-09-23 NOTE — Telephone Encounter (Signed)
 Pt called back and verified that she is taking the Metoprolol at its new prescribed dosage. She went to [facility] to compare BP on their machine and it was comparable. HR has not dropped at all.   Please advise when you are able to. Pt is aware that you are not in office today.

## 2023-09-23 NOTE — Telephone Encounter (Signed)
 Pt says her BP's have been 190's/ can not recall her diastolic bp. She has been compliant with all other BP meds. O2 has been that low. She wears her O2 at night only, she does not want people to see her with it on during the day. She has not checked her O2 during the day.   I did advise her to go to the ER. Since her levels were so low at night and we dont know what they have been during the day. Pt aware and voices understanding.   DOD: Abner Greenspan

## 2023-09-23 NOTE — ED Provider Notes (Signed)
 [place] [facility] DEPARTMENT AT [facility] Provider Note   CSN: [id number] Arrival date & time: 09/23/23  1529     History {Add pertinent medical, surgical, social history, OB history to HPI:1} Chief Complaint  Patient presents with   Hypertension    Adrienne Bennett is a 64 y.o. female.  She has a history of COPD, CHF, diabetes hypertension.  Her blood pressure has been elevated for the last few weeks.  She is also supposed to be on oxygen but has not been using it.  Oxygen saturations have been low.  No fevers.  She said her chest gets tight sometimes when she gets upset.  Nonproductive cough.  No leg swelling.  She talked to her doctor today about her elevated blood pressures and he recommended she come to the emergency department.  They have already increased her metoprolol.  The history is provided by the patient.  Hypertension This is a chronic problem. The current episode started more than 1 week ago. The problem occurs constantly. The problem has not changed since onset.Associated symptoms include chest pain and shortness of breath. Pertinent negatives include no abdominal pain and no headaches. The symptoms are aggravated by stress. Nothing relieves the symptoms. She has tried rest for the symptoms. The treatment provided no relief.       Home Medications Prior to Admission medications   Medication Sig Start Date End Date Taking? Authorizing Provider  albuterol (VENTOLIN HFA) 108 (90 Base) MCG/ACT inhaler Inhale 2 puffs into the lungs every 6 (six) hours as needed for wheezing or shortness of breath. 02/26/21   [name], MD  amLODipine (NORVASC) 10 MG tablet Take 1 tablet (10 mg total) by mouth daily. 08/17/23   [name], MD  apixaban (ELIQUIS) 5 MG TABS tablet Take 1 tablet (5 mg total) by mouth 2 (two) times daily. 08/18/23   [name], MD  atorvastatin (LIPITOR) 40 MG tablet Take 1 tablet (40 mg total) by mouth at bedtime. 06/29/23   [name], MD   baclofen (LIORESAL) 10 MG tablet Take 1 tablet (10 mg total) by mouth 2 (two) times daily. 07/30/23   Juanda Chance, NP  BREO ELLIPTA 100-25 MCG/ACT AEPB INHALE 1 PUFF INTO THE LUNGS  ONCE DAILY 06/26/23   [name], MD  clonazePAM (KLONOPIN) 1 MG tablet Take 1 mg by mouth 2 (two) times daily.  rx by psychiatry    [provider]  dapagliflozin propanediol (FARXIGA) 10 MG TABS tablet Take 1 tablet by mouth once daily 02/05/23   [name], MD  divalproex (DEPAKOTE) 250 MG DR tablet Take 250-500 mg by mouth See admin instructions. Take one tablet (250 mg) by mouth every morning and  (500 mg) at night    [provider]  DULoxetine (CYMBALTA) 60 MG capsule Take 60 mg by mouth every morning. 01/18/23   [provider]  Estradiol 10 MCG TABS vaginal tablet Place 1 tablet (10 mcg total) vaginally at bedtime for 14 days, THEN 1 tablet (10 mcg total) 2 (two) times a week. 03/22/23 10/02/23  Lennart Pall, MD  furosemide (LASIX) 80 MG tablet Take 0.5 tablets (40 mg total) by mouth daily. 08/24/23   Georgeanna Lea, MD  glimepiride (AMARYL) 2 MG tablet Take 1 tablet by mouth daily before breakfast. 07/29/22   [provider]  ipratropium-albuterol (DUONEB) 0.5-2.5 (3) MG/3ML SOLN Take 3 mLs by nebulization 3 (three) times daily as needed. Patient taking  differently: Take 3 mLs by nebulization 3 (three) times daily as needed (shortness of breath/wheezing). 01/10/22   Lanae Boast, MD  metoprolol tartrate (LOPRESSOR) 50 MG tablet Take 1.5 tablets (75 mg total) by mouth 2 (two) times daily. 09/06/23   [name], MD  oxymetazoline (AFRIN) 0.05 % nasal spray Place 1 spray into both nostrils 2 (two) times daily as needed for congestion (nose bleed).    [provider]  potassium chloride (KLOR-CON) 10 MEQ tablet Take 1 tablet (10 mEq total) by mouth daily. 09/06/23   [name], MD  triamcinolone lotion (KENALOG) 0.1 % Apply 1 Application topically 3 (three) times  daily. Patient taking differently: Apply 1 Application topically as needed (skin). 09/14/22   [name], MD      Allergies    Benztropine, Propranolol, Coconut (cocos nucifera), Doxycycline, Lactose intolerance (gi), Lisinopril, Other, Penicillins, Xanax [alprazolam], and Monistat 3 combo pack app  [miconazole nitrate]    Review of Systems   Review of Systems  Constitutional:  Negative for fever.  Respiratory:  Positive for shortness of breath.   Cardiovascular:  Positive for chest pain.  Gastrointestinal:  Negative for abdominal pain.  Genitourinary:  Negative for dysuria.  Neurological:  Negative for headaches.    Physical Exam Updated Vital Signs BP (!) 173/84   Pulse 69   Temp 98.2 F (36.8 C) (Oral)   Resp (!) 22   LMP 08/24/2015 (Exact Date)   SpO2 98%  Physical Exam Vitals and nursing note reviewed.  Constitutional:      General: She is not in acute distress.    Appearance: Normal appearance. She is well-developed.  HENT:     Head: Normocephalic and atraumatic.  Eyes:     Conjunctiva/sclera: Conjunctivae normal.  Cardiovascular:     Rate and Rhythm: Normal rate and regular rhythm.     Heart sounds: No murmur heard. Pulmonary:     Effort: Accessory muscle usage present. No respiratory distress.     Breath sounds: Normal breath sounds.  Abdominal:     Palpations: Abdomen is soft.     Tenderness: There is no abdominal tenderness. There is no guarding or rebound.  Musculoskeletal:     Cervical back: Neck supple.     Right lower leg: No edema.     Left lower leg: No edema.  Skin:    General: Skin is warm and dry.     Capillary Refill: Capillary refill takes less than 2 seconds.  Neurological:     General: No focal deficit present.     Mental Status: She is alert.     ED Results / Procedures / Treatments   Labs (all labs ordered are listed, but only abnormal results are displayed) Labs Reviewed  BLOOD GAS, VENOUS - Abnormal; Notable for the following  components:      Result Value   pCO2, Ven 62 (*)    Bicarbonate 38.4 (*)    Acid-Base Excess 11.0 (*)    All other components within normal limits  BASIC METABOLIC PANEL - Abnormal; Notable for the following components:   Potassium 3.2 (*)    Glucose, Bld 134 (*)    BUN 33 (*)    Creatinine, Ser 1.31 (*)    Calcium 8.3 (*)    GFR, Estimated 46 (*)    All other components within normal limits  CBC WITH DIFFERENTIAL/PLATELET  URINALYSIS, ROUTINE W REFLEX MICROSCOPIC  BRAIN NATRIURETIC PEPTIDE  TROPONIN I (HIGH SENSITIVITY)  TROPONIN I (HIGH  SENSITIVITY)    EKG None  Radiology DG Chest 2 View Result Date: 09/23/2023 CLINICAL DATA:  Low oxygen saturation. EXAM: CHEST - 2 VIEW COMPARISON:  Chest radiograph 08/12/2023 FINDINGS: Patient is rotated. Stable cardiomegaly. Aortic atherosclerosis. Interval development of small bilateral pleural effusions, left-greater-than-right. New heterogeneous opacities within the lung bases bilaterally. No pneumothorax. Degenerative changes thoracolumbar spine. IMPRESSION: 1. Interval development of small bilateral pleural effusions, left-greater-than-right. 2. New heterogeneous opacities within the lung bases bilaterally may represent atelectasis or infection. Electronically Signed   By: Annia Belt M.D.   On: 09/23/2023 16:30    Procedures Procedures  {Document cardiac monitor, telemetry assessment procedure when appropriate:1}  Medications Ordered in ED Medications - No data to display  ED Course/ Medical Decision Making/ A&P   {   Click here for ABCD2, HEART and other calculatorsREFRESH Note before signing :1}                              Medical Decision Making Amount and/or Complexity of Data Reviewed Labs: ordered. Radiology: ordered.   This patient complains of ***; this involves an extensive number of treatment Options and is a complaint that carries with it a high risk of complications and morbidity. The differential includes  ***  I ordered, reviewed and interpreted labs, which included *** I ordered medication *** and reviewed PMP when indicated. I ordered imaging studies which included *** and I independently    visualized and interpreted imaging which showed *** Additional history obtained from *** Previous records obtained and reviewed *** I consulted *** and discussed lab and imaging findings and discussed disposition.  Cardiac monitoring reviewed, *** Social determinants considered, *** Critical Interventions: ***  After the interventions stated above, I reevaluated the patient and found *** Admission and further testing considered, ***   {Document critical care time when appropriate:1} {Document review of labs and clinical decision tools ie heart score, Chads2Vasc2 etc:1}  {Document your independent review of radiology images, and any outside records:1} {Document your discussion with family members, caretakers, and with consultants:1} {Document social determinants of health affecting pt's care:1} {Document your decision making why or why not admission, treatments were needed:1} Final Clinical Impression(s) / ED Diagnoses Final diagnoses:  None    Rx / DC Orders ED Discharge Orders     None

## 2023-09-23 NOTE — Telephone Encounter (Signed)
 Left vm to return call.

## 2023-09-23 NOTE — Discharge Instructions (Signed)
 You were seen in the emergency department for increased shortness of breath and elevated blood pressures.  You had lab work chest x-ray and a CAT scan of your chest.  You have a thoracic aneurysm that will need to be followed by your doctor.  You were given a dose of IV diuretic.  Please continue to use your oxygen and take your medications as prescribed.  Continue to monitor your blood pressure at home and closely follow-up with your primary care doctor and your cardiology team.  Return if any worsening or concerning symptoms

## 2023-09-23 NOTE — Telephone Encounter (Signed)
 See patient's message. Please call her   Ask how is her blood pressure is today. If it is in the 190s consistently or if  she is truly  hypoxic (she said O2 sat in the 70s) then she needs to go to the ER.  I know she is taking metoprolol 50 mg 1.5 tabs BID Ask if she is compliant with amlodipine, Lasix and potassium.  Okay to run the answer by the doctor of the day, otherwise I will available to answer tomorrow. ( I am off today)  Please note allergies : Propranolol "seizures". Lisinopril, lip swelling, stomatitis.  If heart rate is not low, we could increase metoprolol. Other consideration is clonidine

## 2023-09-23 NOTE — ED Notes (Signed)
 Pt assisted to POV. Pt O2 transferred to her portable oxygen.

## 2023-09-23 NOTE — Telephone Encounter (Signed)
 FYI: Pt was seen on 09/06/23: BP was 162/60.   Pt was advised to increase metoprolol 50 mg to: 1.5 tablets twice daily and keep all  other medications the same. Advised to call if bp is not gradually improving let me know in few days. Pt was also advised to arrange for a nurse visit in 3 weeks and to cancel the appointment she had with you later on this month and arrange for an in 3 months.  Called pt back to verify that she has been taking Metoprolol 50 mg 1.5 tab BID with other meds and to see what happened to her NV.  I had to lvm.

## 2023-09-23 NOTE — ED Triage Notes (Signed)
 Pt referred to ED from PCP's office for low oxygen saturations and hypertension over the last two weeks. Per pt she is prescribed 2 lpm O2 via [place] at all times but refuses to wear it d/t "feeling like an invalid". Pt endorses DOE. Room air sat on arrival is 83%, improved to 98% with 2 lpm O2. Pt also states that over the last two weeks she's had SBP as high as 190. Pt endorses intermittent chest tightness, but none currently. Pt denies headache, vision changes, unilateral weakness.

## 2023-09-23 NOTE — Telephone Encounter (Signed)
 Pt called back and verified that she is taking the Metoprolol at its new prescribed dosage. She went to [employer] to compare BP on their machine and it was comparable. HR has not dropped at all.   Will respond in the correspondence in Pt advise request.

## 2023-09-24 ENCOUNTER — Ambulatory Visit: Payer: Medicare Other | Admitting: Internal Medicine

## 2023-09-24 ENCOUNTER — Telehealth: Payer: Self-pay

## 2023-09-24 ENCOUNTER — Other Ambulatory Visit: Payer: Medicare Other

## 2023-09-24 NOTE — Transitions of Care (Post Inpatient/ED Visit) (Signed)
   09/24/2023  Name: Adrienne Bennett MRN: [id number] DOB: 02/09/1961  Today's TOC FU Call Status:    Attempted to reach the patient regarding the most recent Inpatient/ED visit.  Follow Up Plan: Additional outreach attempts will be made to reach the patient to complete the Transitions of Care (Post Inpatient/ED visit) call.   Signature Alysia Penna

## 2023-09-27 ENCOUNTER — Telehealth: Payer: Self-pay | Admitting: Internal Medicine

## 2023-09-27 DIAGNOSIS — R569 Unspecified convulsions: Secondary | ICD-10-CM | POA: Diagnosis not present

## 2023-09-27 DIAGNOSIS — J90 Pleural effusion, not elsewhere classified: Secondary | ICD-10-CM | POA: Diagnosis not present

## 2023-09-27 DIAGNOSIS — Z9981 Dependence on supplemental oxygen: Secondary | ICD-10-CM | POA: Diagnosis not present

## 2023-09-27 DIAGNOSIS — J9602 Acute respiratory failure with hypercapnia: Secondary | ICD-10-CM | POA: Diagnosis not present

## 2023-09-27 DIAGNOSIS — Z9911 Dependence on respirator [ventilator] status: Secondary | ICD-10-CM | POA: Diagnosis not present

## 2023-09-27 DIAGNOSIS — J811 Chronic pulmonary edema: Secondary | ICD-10-CM | POA: Diagnosis not present

## 2023-09-27 DIAGNOSIS — R599 Enlarged lymph nodes, unspecified: Secondary | ICD-10-CM | POA: Diagnosis not present

## 2023-09-27 DIAGNOSIS — Z781 Physical restraint status: Secondary | ICD-10-CM | POA: Diagnosis not present

## 2023-09-27 DIAGNOSIS — Z66 Do not resuscitate: Secondary | ICD-10-CM | POA: Diagnosis not present

## 2023-09-27 DIAGNOSIS — I6782 Cerebral ischemia: Secondary | ICD-10-CM | POA: Diagnosis not present

## 2023-09-27 DIAGNOSIS — G931 Anoxic brain damage, not elsewhere classified: Secondary | ICD-10-CM | POA: Diagnosis not present

## 2023-09-27 DIAGNOSIS — R9431 Abnormal electrocardiogram [ECG] [EKG]: Secondary | ICD-10-CM | POA: Diagnosis not present

## 2023-09-27 DIAGNOSIS — J9601 Acute respiratory failure with hypoxia: Secondary | ICD-10-CM | POA: Diagnosis not present

## 2023-09-27 DIAGNOSIS — R739 Hyperglycemia, unspecified: Secondary | ICD-10-CM | POA: Diagnosis not present

## 2023-09-27 DIAGNOSIS — R918 Other nonspecific abnormal finding of lung field: Secondary | ICD-10-CM | POA: Diagnosis not present

## 2023-09-27 DIAGNOSIS — Z20822 Contact with and (suspected) exposure to covid-19: Secondary | ICD-10-CM | POA: Diagnosis not present

## 2023-09-27 DIAGNOSIS — I517 Cardiomegaly: Secondary | ICD-10-CM | POA: Diagnosis not present

## 2023-09-27 DIAGNOSIS — E1122 Type 2 diabetes mellitus with diabetic chronic kidney disease: Secondary | ICD-10-CM | POA: Diagnosis not present

## 2023-09-27 DIAGNOSIS — N183 Chronic kidney disease, stage 3 unspecified: Secondary | ICD-10-CM | POA: Diagnosis not present

## 2023-09-27 DIAGNOSIS — J441 Chronic obstructive pulmonary disease with (acute) exacerbation: Secondary | ICD-10-CM | POA: Diagnosis not present

## 2023-09-27 DIAGNOSIS — R404 Transient alteration of awareness: Secondary | ICD-10-CM | POA: Diagnosis not present

## 2023-09-27 DIAGNOSIS — R0600 Dyspnea, unspecified: Secondary | ICD-10-CM | POA: Diagnosis not present

## 2023-09-27 DIAGNOSIS — R9401 Abnormal electroencephalogram [EEG]: Secondary | ICD-10-CM | POA: Diagnosis not present

## 2023-09-27 DIAGNOSIS — J9611 Chronic respiratory failure with hypoxia: Secondary | ICD-10-CM | POA: Diagnosis not present

## 2023-09-27 DIAGNOSIS — Z515 Encounter for palliative care: Secondary | ICD-10-CM | POA: Diagnosis not present

## 2023-09-27 DIAGNOSIS — K7460 Unspecified cirrhosis of liver: Secondary | ICD-10-CM | POA: Diagnosis not present

## 2023-09-27 DIAGNOSIS — I5033 Acute on chronic diastolic (congestive) heart failure: Secondary | ICD-10-CM | POA: Diagnosis not present

## 2023-09-27 DIAGNOSIS — I4891 Unspecified atrial fibrillation: Secondary | ICD-10-CM | POA: Diagnosis not present

## 2023-09-27 DIAGNOSIS — R464 Slowness and poor responsiveness: Secondary | ICD-10-CM | POA: Diagnosis not present

## 2023-09-27 DIAGNOSIS — I469 Cardiac arrest, cause unspecified: Secondary | ICD-10-CM | POA: Diagnosis not present

## 2023-09-27 DIAGNOSIS — Z4682 Encounter for fitting and adjustment of non-vascular catheter: Secondary | ICD-10-CM | POA: Diagnosis not present

## 2023-09-27 DIAGNOSIS — R0602 Shortness of breath: Secondary | ICD-10-CM | POA: Diagnosis not present

## 2023-09-27 DIAGNOSIS — R0902 Hypoxemia: Secondary | ICD-10-CM | POA: Diagnosis not present

## 2023-09-27 DIAGNOSIS — E872 Acidosis, unspecified: Secondary | ICD-10-CM | POA: Diagnosis not present

## 2023-09-27 DIAGNOSIS — I130 Hypertensive heart and chronic kidney disease with heart failure and stage 1 through stage 4 chronic kidney disease, or unspecified chronic kidney disease: Secondary | ICD-10-CM | POA: Diagnosis not present

## 2023-09-27 NOTE — Telephone Encounter (Signed)
 Called and spoke with the patient's husband, Aurther Loft. After the ER visit 09/23/2023 she felt relatively well. However today at 1 AM his O2 sat was very low despite using oxygen, O2 sat down to the 70s,  when EMS arrived, reportedly the patient had shallow breathing, and shortly after the could not find a pulse. She is now at the hospital, intubated. Camelia Eng has been told that the prognosis is uncertain. We agreed he will let me know if there is any major changes on her status. He is more than welcome to call anytime if   has questions or concerns.

## 2023-09-27 NOTE — Telephone Encounter (Signed)
 Pt was seen at [facility] ER on 09/23/23, then admitted into [facility] today.

## 2023-09-29 ENCOUNTER — Ambulatory Visit: Payer: Medicare Other

## 2023-09-30 ENCOUNTER — Other Ambulatory Visit: Payer: Self-pay | Admitting: Internal Medicine

## 2023-10-04 ENCOUNTER — Telehealth: Payer: Self-pay

## 2023-10-04 NOTE — Telephone Encounter (Signed)
 Pt passed away on 10/18/2023 at [facility] hospital.

## 2023-10-04 NOTE — Telephone Encounter (Signed)
 Called the patient's husband, left message with condolences on his phone

## 2023-10-18 ENCOUNTER — Other Ambulatory Visit: Payer: Self-pay | Admitting: Internal Medicine

## 2023-10-26 DEATH — deceased

## 2023-12-06 ENCOUNTER — Ambulatory Visit: Payer: Medicare Other | Admitting: Internal Medicine

## 2024-03-30 ENCOUNTER — Ambulatory Visit: Payer: Medicare Other

## 2024-07-28 ENCOUNTER — Other Ambulatory Visit (HOSPITAL_COMMUNITY): Payer: Self-pay
